# Patient Record
Sex: Male | Born: 1960 | Race: White | Hispanic: No | Smoking: Current every day smoker
Health system: Southern US, Community
[De-identification: ages and names within clinical notes are randomized; demographics above are authoritative.]

## PROBLEM LIST (undated history)

## (undated) DIAGNOSIS — E7800 Pure hypercholesterolemia, unspecified: Secondary | ICD-10-CM

## (undated) DIAGNOSIS — I639 Cerebral infarction, unspecified: Secondary | ICD-10-CM

## (undated) HISTORY — DX: Cerebral infarction, unspecified: I63.9

## (undated) HISTORY — DX: Pure hypercholesterolemia, unspecified: E78.00

## (undated) HISTORY — PX: INGUINAL HERNIA REPAIR: SUR1180

## (undated) SURGERY — Surgical Case
Anesthesia: *Unknown

---

## 2012-01-31 ENCOUNTER — Ambulatory Visit
Admission: RE | Admit: 2012-01-31 | Discharge: 2012-01-31 | Disposition: A | Discharge status: Home / Self Care | Payer: 59 | Source: Ambulatory Visit | Attending: Family Medicine | Admitting: Family Medicine

## 2012-01-31 ENCOUNTER — Other Ambulatory Visit: Payer: Self-pay | Admitting: Family Medicine

## 2012-01-31 DIAGNOSIS — R079 Chest pain, unspecified: Secondary | ICD-10-CM

## 2012-02-04 ENCOUNTER — Other Ambulatory Visit: Payer: Self-pay | Admitting: Chiropractic Medicine

## 2012-02-04 ENCOUNTER — Ambulatory Visit
Admission: RE | Admit: 2012-02-04 | Discharge: 2012-02-04 | Disposition: A | Discharge status: Home / Self Care | Payer: 59 | Source: Ambulatory Visit | Attending: Chiropractic Medicine | Admitting: Chiropractic Medicine

## 2012-02-04 DIAGNOSIS — R222 Localized swelling, mass and lump, trunk: Secondary | ICD-10-CM

## 2012-02-04 DIAGNOSIS — R0789 Other chest pain: Secondary | ICD-10-CM

## 2012-02-04 MED ORDER — IOHEXOL 300 MG/ML  SOLN
75.0000 mL | Freq: Once | INTRAMUSCULAR | Status: AC | PRN
  Administered 2012-02-04: 75 mL via INTRAVENOUS

## 2012-02-06 ENCOUNTER — Ambulatory Visit: Payer: 59

## 2012-02-06 ENCOUNTER — Other Ambulatory Visit (HOSPITAL_BASED_OUTPATIENT_CLINIC_OR_DEPARTMENT_OTHER): Payer: Medicare HMO | Admitting: Lab

## 2012-02-06 ENCOUNTER — Telehealth: Payer: Self-pay | Admitting: Internal Medicine

## 2012-02-06 ENCOUNTER — Ambulatory Visit (HOSPITAL_BASED_OUTPATIENT_CLINIC_OR_DEPARTMENT_OTHER): Payer: Medicare HMO | Admitting: Internal Medicine

## 2012-02-06 ENCOUNTER — Encounter: Payer: Self-pay | Admitting: *Deleted

## 2012-02-06 ENCOUNTER — Encounter: Payer: Self-pay | Admitting: Internal Medicine

## 2012-02-06 ENCOUNTER — Telehealth: Payer: Self-pay | Admitting: Oncology

## 2012-02-06 VITALS — BP 124/74 | HR 105 | Temp 98.3°F | Resp 20 | Ht 70.5 in | Wt 181.9 lb

## 2012-02-06 DIAGNOSIS — R222 Localized swelling, mass and lump, trunk: Secondary | ICD-10-CM

## 2012-02-06 DIAGNOSIS — R911 Solitary pulmonary nodule: Secondary | ICD-10-CM

## 2012-02-06 DIAGNOSIS — F17200 Nicotine dependence, unspecified, uncomplicated: Secondary | ICD-10-CM

## 2012-02-06 DIAGNOSIS — E7800 Pure hypercholesterolemia, unspecified: Secondary | ICD-10-CM | POA: Insufficient documentation

## 2012-02-06 DIAGNOSIS — I10 Essential (primary) hypertension: Secondary | ICD-10-CM

## 2012-02-06 DIAGNOSIS — R918 Other nonspecific abnormal finding of lung field: Secondary | ICD-10-CM | POA: Insufficient documentation

## 2012-02-06 DIAGNOSIS — I639 Cerebral infarction, unspecified: Secondary | ICD-10-CM

## 2012-02-06 DIAGNOSIS — R071 Chest pain on breathing: Secondary | ICD-10-CM

## 2012-02-06 HISTORY — DX: Cerebral infarction, unspecified: I63.9

## 2012-02-06 HISTORY — DX: Pure hypercholesterolemia, unspecified: E78.00

## 2012-02-06 LAB — COMPREHENSIVE METABOLIC PANEL (CC13)
Alkaline Phosphatase: 175 U/L — ABNORMAL HIGH (ref 40–150)
BUN: 19 mg/dL (ref 7.0–26.0)
CO2: 25 mEq/L (ref 22–29)
Creatinine: 1.3 mg/dL (ref 0.7–1.3)
Glucose: 149 mg/dl — ABNORMAL HIGH (ref 70–99)
Sodium: 135 mEq/L — ABNORMAL LOW (ref 136–145)
Total Bilirubin: 0.75 mg/dL (ref 0.20–1.20)
Total Protein: 7.4 g/dL (ref 6.4–8.3)

## 2012-02-06 LAB — CBC WITH DIFFERENTIAL/PLATELET
BASO%: 0.1 % (ref 0.0–2.0)
Eosinophils Absolute: 0.2 10*3/uL (ref 0.0–0.5)
HCT: 39 % (ref 38.4–49.9)
HGB: 13.5 g/dL (ref 13.0–17.1)
LYMPH%: 2.9 % — ABNORMAL LOW (ref 14.0–49.0)
MCHC: 34.5 g/dL (ref 32.0–36.0)
MONO#: 2.2 10*3/uL — ABNORMAL HIGH (ref 0.1–0.9)
NEUT#: 31.8 10*3/uL — ABNORMAL HIGH (ref 1.5–6.5)
NEUT%: 90.1 % — ABNORMAL HIGH (ref 39.0–75.0)
Platelets: 432 10*3/uL — ABNORMAL HIGH (ref 140–400)
WBC: 35.3 10*3/uL — ABNORMAL HIGH (ref 4.0–10.3)
lymph#: 1 10*3/uL (ref 0.9–3.3)

## 2012-02-06 LAB — TECHNOLOGIST REVIEW

## 2012-02-06 MED ORDER — MORPHINE SULFATE ER 30 MG PO TBCR: 30.0000 mg | EXTENDED_RELEASE_TABLET | Freq: Two times a day (BID) | ORAL | Status: DC

## 2012-02-06 NOTE — Telephone Encounter (Signed)
gv adn printed appt schedule for pt for Jan....schdule radiation for 1.22.14 with Dr. Basilio Cairo @ 9:30am...advised pt that central scheduling will contact with d/t for MRI, PET, and CT

## 2012-02-06 NOTE — Progress Notes (Signed)
[facility] [phone]   [phone]  CONSULT NOTE  REFERRING PHYSICIAN: Dr. Dwyane Dee.  REASON FOR CONSULTATION:  52 years old white male with questionable lung cancer.  HPI CHAMPION Brock is a 52 y.o. male was past medical history significant for hypertension, dyslipidemia, history of stroke in September of 2013 secondary to malignant hypertension as well as history of right pneumothorax secondary to fractured ribs. The patient also has a long history of smoking. He most to [place] recently to be close to his mom after he lost his Job in [place] state. He has been complaining of pain on the left shoulder, left upper back as well as the front of the left chest started this in 2 weeks ago. He was seen initially at the urgent care and chest x-ray was performed that was unremarkable. The patient was started on pain medication in the form of Vicodin with no improvement. He was seen by a chiropractor and repeat chest x-ray showed questionable mass in the upper part of the left lung. This was followed by CT scan of the chest which was performed on 02/04/2012 and it showed the left upper lobe soft tissue mass anteriorly abutting the pleura and measured 7.3 x 4.4 x 5.0 CM. There does appear to be surrounding infiltrate. This is worrisome for primary lung carcinoma. A portion of the soft tissue mass extends through the left anterior chest wall and involves the left pectoralis musculature again worrisome for tumor, also no bone destruction is seen but an unusual hematoma could cause the appearance in the event of recent trauma to the site. There was also multiple poorly defined nodular opacities throughout the lungs worrisome for metastatic lesions. There was a small left pleural effusion and a slightly prominent mediastinum and left axilla lymph nodes. The patient was referred to me today for further evaluation and recommendation regarding his condition. He still  complaining of severe pain in the left anterior chest wall as well as the shoulder and neck area. He is currently on Percocet and Vicodin as well as muscle relaxant with no significant improvement. He also complained of chest congestion as well as cough productive of grayish sputum. He has shortness of breath only when he is hurting on the left side of the chest. He lost around 5 pounds over the last 2 weeks. He has no significant headache or blurry vision.  PAST MEDICAL HISTORY: 1) hypertension 2) Dyslipidemia 3) Stroke in 2013 4) Right pneumothorax secondary to trauma  FAMILY HISTORY: is unremarkable for any malignancy.   SOCIAL HISTORY: The patient is married and has 2 children ages 81 and 84. He was accompanied today by his wife Frank Brock and his mother Frank Brock. 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 He has a history of smoking one pack per day for around 35 years and unfortunately he continues to smoke and I strongly advise to quit smoking. He also drinks around 4 alcoholic drinks every day. He has no history of drug abuse.   No Known Allergies  Current Outpatient Prescriptions  Medication Sig Dispense Refill  . aspirin 81 MG tablet Take 81 mg by mouth daily.      Marland Kitchen docusate sodium (COLACE) 100 MG capsule Take 100 mg by mouth 2 (two) times daily.      Marland Kitchen HYDROcodone-acetaminophen (NORCO/VICODIN) 5-325 MG per tablet       . lisinopril (PRINIVIL,ZESTRIL) 20 MG tablet Take 20 mg by mouth daily.      Marland Kitchen  Multiple Vitamin (MULTIVITAMIN) tablet Take 1 tablet by mouth daily.      Marland Kitchen oxyCODONE-acetaminophen (PERCOCET/ROXICET) 5-325 MG per tablet       . ranitidine (ZANTAC) 150 MG capsule Take 150 mg by mouth 2 (two) times daily.      . simvastatin (ZOCOR) 40 MG tablet Take 40 mg by mouth every evening.      Marland Kitchen morphine (MS CONTIN) 30 MG 12 hr tablet Take 1 tablet (30 mg total) by mouth 2 (two) times daily.  60 tablet  0    Review of Systems  A comprehensive review of systems  was negative except for: Constitutional: positive for anorexia, fatigue and weight loss Respiratory: positive for cough, dyspnea on exertion, pleurisy/chest pain and wheezing Musculoskeletal: positive for bone pain  Physical Exam  NWG:NFAOZ, healthy, no distress, well nourished and well developed SKIN: skin color, texture, turgor are normal HEAD: Normocephalic, No masses, lesions, tenderness or abnormalities EYES: normal, PERRLA EARS: External ears normal OROPHARYNX:no exudate and no erythema  NECK: supple, no adenopathy LYMPH:  no palpable lymphadenopathy, no hepatosplenomegaly LUNGS: clear to auscultation  HEART: regular rate & rhythm, no murmurs and no gallops ABDOMEN:abdomen soft, non-tender, normal bowel sounds and no masses or organomegaly BACK: Back symmetric, no curvature. EXTREMITIES:no joint deformities, effusion, or inflammation, no edema, no skin discoloration, no clubbing  NEURO: alert & oriented x 3 with fluent speech, no focal motor/sensory deficits, gait normal  PERFORMANCE STATUS: ECOG 1  LABORATORY DATA: Lab Results  Component Value Date   WBC 35.3* 02/06/2012   HGB 13.5 02/06/2012   HCT 39.0 02/06/2012   MCV 87.0 02/06/2012   PLT 432* 02/06/2012      Chemistry      Component Value Date/Time   NA 135* 02/06/2012 0942   K 4.0 02/06/2012 0942   CL 97* 02/06/2012 0942   CO2 25 02/06/2012 0942   BUN 19.0 02/06/2012 0942   CREATININE 1.3 02/06/2012 0942      Component Value Date/Time   CALCIUM 10.3 02/06/2012 0942   ALKPHOS 175* 02/06/2012 0942   AST 48* 02/06/2012 0942   ALT 57* 02/06/2012 0942   BILITOT 0.75 02/06/2012 0942       RADIOGRAPHIC STUDIES: Dg Chest 2 View  01/31/2012  *RADIOLOGY REPORT*  Clinical Data: Upper chest pain  CHEST - 2 VIEW  Comparison: None.  Findings: The heart and pulmonary vascularity are within normal limits.  There are findings of prior rib fractures with healing on the right.  The lungs are clear.  No acute abnormality is noted.   IMPRESSION: No acute abnormality noted.   Original Report Authenticated By: Alcide Clever, M.D.    Ct Chest W Contrast  02/04/2012  *RADIOLOGY REPORT*  Clinical Data: Chest pain and swelling over the left upper chest, reportedly for last week  CT CHEST WITH CONTRAST  Technique:  Multidetector CT imaging of the chest was performed following the standard protocol during bolus administration of intravenous contrast.  Contrast: 75mL OMNIPAQUE IOHEXOL 300 MG/ML  SOLN  Comparison: Chest x-ray of 01/31/2011  Findings: On soft tissue window images, there is an oval soft tissue mass within the left upper lobe measuring approximately 7.3 x 4.4 x 5.0 cm comprised of mixed attenuation and a small amount of air.  There is surrounding parenchymal streakiness most consistent with adjacent pneumonia.  This mass appears to extend through the left chest wall although no bony erosion is evident, and  it appears to involve the left pectoralis musculature as well.  There is some cortical irregularity of the left costochondral junction, and conceivably this could all represent hematoma in the event of recent direct trauma to this region.  There do appear to be old rib fractures bilaterally.  However, I would favor a malignancy as a cause of this soft tissue mass extending through the chest wall.  On the lung window images there is a small nodular opacity in the left lung apex of 9 mm in diameter, with an opacity posteriorly in the left lower lobe superior segment measuring 11 mm. A ground- glass opacity also is noted medially in the left lower lobe of 18 mm in diameter  A small ground-glass opacity is noted posteriorly in the right lower lobe inferiorly of 8 mm in diameter. Smaller nodular opacities also were present bilaterally.  The multiplicity of vague nodular opacities is suspicious for metastatic involvement.  A small left pleural effusion is present. Either the soft tissue mass or the peripheral lung lesion could be biopsied. A  rounded defect within the left mainstem bronchus may represent residual mucus or polypoid lesion.  Slightly prominent left axillary lymph nodes are present. There are multiple mediastinal nodes present with the largest of 10 mm in short axis diameter and overlying the left suprahilar region.  In the upper abdomen, a subtle left lobe of liver lesion cannot be excluded.  IMPRESSION:  1.  Left upper lobe soft tissue mass anteriorly abutting the pleura of 7.3 x 4.4 x 5.0 cm.  There does appear to be surrounding infiltrate.  This is worrisome for primary lung carcinoma. 2.  A portion of this soft tissue mass extends through the left anterior chest wall and  involves the left pectoralis musculature again worrisome for tumor, although no bone destruction is seen. An unusual hematoma could cause this appearance in the event of recent direct trauma to this site.  An infectious process seems less likely. 3.  Multiple poorly defined nodular opacities throughout the lungs worrisome for metastatic lesions. 4.  Small left pleural effusion. 5.  Slightly prominent mediastinal and left axillary lymph nodes. 6.  Cannot exclude nondisplaced fracture of the left anterior costochondral junction. 7.  Polypoid lesion or mucous within the left mainstem bronchus.   Original Report Authenticated By: Dwyane Dee, M.D.     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 These finding suspicious for primary bronchogenic carcinoma in a patient with known history of smoking. The patient had severe pain in the left anterior chest wall as well as shoulder and upper back  PLAN: I have a lengthy discussion with the patient and his family today about his current disease status and further investigation to confirm diagnosis as well as treatment options. I recommended for the patient the  following: 1) complete the staging workup by ordering a PET scan as well as MRI of the brain to be done as soon as possible. 2) refer the patient to interventional radiology for consideration of CT-guided fine needle aspiration and core biopsy of the left upper lobe lung mass for tissue diagnosis. 3) refer the patient to radiation oncology for consideration of palliative radiotherapy to the left apical lung mass for pain management. 4) I will adjust his pain medication by ordering MS Contin 30 mg by mouth every 12 hours. In addition the patient will continue on Percocet or Vicodin for breakthrough pain. 5)  I strongly encouraged the patient to quit smoking and alcohol drinking. He was seen by the thoracic oncology nurse navigator today for counseling regarding smoke cessation. 6) the patient would come back for followup visit in 2 weeks for evaluation and discussion of his treatment options based on the imaging studies as well as the final pathology report. He was advised to call immediately if he has any concerning symptoms in the interval. I gave the patient and his family the time to ask questions and I answered them completely to their satisfaction.  All questions were answered. The patient knows to call the clinic with any problems, questions or concerns. We can certainly see the patient much sooner if necessary.  Thank you so much for allowing me to participate in the care of Frank Brock. I will continue to follow up the patient with you and assist in his care.  I spent 30 minutes counseling the patient face to face. The total time spent in the appointment was 60 minutes.  Marylan Glore K. 02/06/2012, 10:59 AM

## 2012-02-06 NOTE — Progress Notes (Signed)
Spoke with pt and family at [facility] today.  Gave and explained information on smoking cessation.  Resource information also given.

## 2012-02-06 NOTE — Patient Instructions (Signed)
You have highly suspicious metastatic lung cancer. I ordered several studies to confirm the diagnosis and complete the staging workup. I will refer you to radiation oncology for palliative radiotherapy to the left lung mass. Will adjust your pain medication. Followup in 2 weeks

## 2012-02-06 NOTE — Progress Notes (Signed)
Checked in new pt with no financial concerns. °

## 2012-02-06 NOTE — Telephone Encounter (Signed)
Medical Oncology on call  Call from patient's mother that he is in severe pain despite one dose of MS Contin, has no percocet as he "could not fill" the prescription written by Dr [name] today. I explained to mother that it usually takes 2-3 doses of the sustained release morphine to build up enough to be able to tell a difference with that. I called pharmacist at [facility], who tells me that patient's insurance will not cover the percocet prescription until tomorrow, but that he can pay out of pocket for it. I spoke back to mother, who understands and will go to pharmacy now.  Ila Mcgill, MD

## 2012-02-07 ENCOUNTER — Encounter (HOSPITAL_COMMUNITY): Payer: Self-pay | Admitting: Pharmacy Technician

## 2012-02-07 ENCOUNTER — Telehealth: Payer: Self-pay | Admitting: Internal Medicine

## 2012-02-07 NOTE — Telephone Encounter (Signed)
lmonvm for pt re appt for mri to be done 1/20 @ 11:30 am (arrive 11am) @ [facility] [address] location. S/w vicky @ [facility]. To Ms. Frank Brock to auth.

## 2012-02-08 ENCOUNTER — Other Ambulatory Visit: Payer: Self-pay

## 2012-02-08 ENCOUNTER — Inpatient Hospital Stay (HOSPITAL_COMMUNITY)
Admission: EM | Admit: 2012-02-08 | Discharge: 2012-02-25 | DRG: 853 | Disposition: A | Discharge status: Rehabilitation Facility | Payer: Medicare HMO | Attending: Internal Medicine | Admitting: Internal Medicine

## 2012-02-08 ENCOUNTER — Other Ambulatory Visit: Payer: Self-pay | Admitting: Physician Assistant

## 2012-02-08 ENCOUNTER — Encounter (HOSPITAL_COMMUNITY): Payer: Self-pay | Admitting: *Deleted

## 2012-02-08 ENCOUNTER — Telehealth: Payer: Self-pay | Admitting: Internal Medicine

## 2012-02-08 ENCOUNTER — Telehealth: Payer: Self-pay | Admitting: *Deleted

## 2012-02-08 ENCOUNTER — Emergency Department (HOSPITAL_COMMUNITY): Payer: Medicare HMO

## 2012-02-08 DIAGNOSIS — I6350 Cerebral infarction due to unspecified occlusion or stenosis of unspecified cerebral artery: Secondary | ICD-10-CM

## 2012-02-08 DIAGNOSIS — R4182 Altered mental status, unspecified: Secondary | ICD-10-CM

## 2012-02-08 DIAGNOSIS — R739 Hyperglycemia, unspecified: Secondary | ICD-10-CM | POA: Diagnosis present

## 2012-02-08 DIAGNOSIS — Z8673 Personal history of transient ischemic attack (TIA), and cerebral infarction without residual deficits: Secondary | ICD-10-CM

## 2012-02-08 DIAGNOSIS — L02213 Cutaneous abscess of chest wall: Secondary | ICD-10-CM | POA: Diagnosis present

## 2012-02-08 DIAGNOSIS — I639 Cerebral infarction, unspecified: Secondary | ICD-10-CM | POA: Diagnosis present

## 2012-02-08 DIAGNOSIS — G4700 Insomnia, unspecified: Secondary | ICD-10-CM | POA: Diagnosis present

## 2012-02-08 DIAGNOSIS — D649 Anemia, unspecified: Secondary | ICD-10-CM | POA: Diagnosis present

## 2012-02-08 DIAGNOSIS — R918 Other nonspecific abnormal finding of lung field: Secondary | ICD-10-CM | POA: Diagnosis present

## 2012-02-08 DIAGNOSIS — E7800 Pure hypercholesterolemia, unspecified: Secondary | ICD-10-CM | POA: Diagnosis present

## 2012-02-08 DIAGNOSIS — E870 Hyperosmolality and hypernatremia: Secondary | ICD-10-CM | POA: Diagnosis not present

## 2012-02-08 DIAGNOSIS — C3490 Malignant neoplasm of unspecified part of unspecified bronchus or lung: Secondary | ICD-10-CM | POA: Diagnosis present

## 2012-02-08 DIAGNOSIS — I82629 Acute embolism and thrombosis of deep veins of unspecified upper extremity: Secondary | ICD-10-CM | POA: Diagnosis present

## 2012-02-08 DIAGNOSIS — G8220 Paraplegia, unspecified: Secondary | ICD-10-CM | POA: Diagnosis not present

## 2012-02-08 DIAGNOSIS — N179 Acute kidney failure, unspecified: Secondary | ICD-10-CM | POA: Diagnosis present

## 2012-02-08 DIAGNOSIS — R52 Pain, unspecified: Secondary | ICD-10-CM

## 2012-02-08 DIAGNOSIS — A4101 Sepsis due to Methicillin susceptible Staphylococcus aureus: Principal | ICD-10-CM

## 2012-02-08 DIAGNOSIS — I10 Essential (primary) hypertension: Secondary | ICD-10-CM

## 2012-02-08 DIAGNOSIS — R222 Localized swelling, mass and lump, trunk: Secondary | ICD-10-CM

## 2012-02-08 DIAGNOSIS — A419 Sepsis, unspecified organism: Secondary | ICD-10-CM | POA: Insufficient documentation

## 2012-02-08 DIAGNOSIS — Z9889 Other specified postprocedural states: Secondary | ICD-10-CM

## 2012-02-08 DIAGNOSIS — E876 Hypokalemia: Secondary | ICD-10-CM | POA: Diagnosis present

## 2012-02-08 DIAGNOSIS — R0902 Hypoxemia: Secondary | ICD-10-CM

## 2012-02-08 DIAGNOSIS — R5381 Other malaise: Secondary | ICD-10-CM | POA: Diagnosis present

## 2012-02-08 DIAGNOSIS — J9601 Acute respiratory failure with hypoxia: Secondary | ICD-10-CM | POA: Diagnosis present

## 2012-02-08 DIAGNOSIS — J852 Abscess of lung without pneumonia: Secondary | ICD-10-CM | POA: Diagnosis present

## 2012-02-08 DIAGNOSIS — B9562 Methicillin resistant Staphylococcus aureus infection as the cause of diseases classified elsewhere: Secondary | ICD-10-CM | POA: Diagnosis present

## 2012-02-08 DIAGNOSIS — J9600 Acute respiratory failure, unspecified whether with hypoxia or hypercapnia: Secondary | ICD-10-CM

## 2012-02-08 DIAGNOSIS — F17200 Nicotine dependence, unspecified, uncomplicated: Secondary | ICD-10-CM | POA: Diagnosis present

## 2012-02-08 DIAGNOSIS — E871 Hypo-osmolality and hyponatremia: Secondary | ICD-10-CM | POA: Diagnosis present

## 2012-02-08 DIAGNOSIS — R1310 Dysphagia, unspecified: Secondary | ICD-10-CM | POA: Diagnosis present

## 2012-02-08 DIAGNOSIS — J189 Pneumonia, unspecified organism: Secondary | ICD-10-CM | POA: Diagnosis not present

## 2012-02-08 DIAGNOSIS — R7881 Bacteremia: Secondary | ICD-10-CM

## 2012-02-08 DIAGNOSIS — G8250 Quadriplegia, unspecified: Secondary | ICD-10-CM | POA: Diagnosis present

## 2012-02-08 DIAGNOSIS — L02219 Cutaneous abscess of trunk, unspecified: Secondary | ICD-10-CM | POA: Diagnosis present

## 2012-02-08 DIAGNOSIS — M4620 Osteomyelitis of vertebra, site unspecified: Secondary | ICD-10-CM | POA: Diagnosis present

## 2012-02-08 LAB — COMPREHENSIVE METABOLIC PANEL
ALT: 62 U/L — ABNORMAL HIGH (ref 0–53)
AST: 80 U/L — ABNORMAL HIGH (ref 0–37)
Albumin: 2 g/dL — ABNORMAL LOW (ref 3.5–5.2)
CO2: 24 mEq/L (ref 19–32)
Calcium: 9.4 mg/dL (ref 8.4–10.5)
GFR calc non Af Amer: 29 mL/min — ABNORMAL LOW (ref >90)
Sodium: 127 mEq/L — ABNORMAL LOW (ref 135–145)
Total Protein: 7.4 g/dL (ref 6.0–8.3)

## 2012-02-08 LAB — BLOOD GAS, ARTERIAL
Acid-base deficit: 2.1 mmol/L — ABNORMAL HIGH (ref 0.0–2.0)
Drawn by: 340271
FIO2: 0.32 %
O2 Saturation: 89.9 %
Patient temperature: 98.6
TCO2: 20.4 mmol/L (ref 0–100)
pCO2 arterial: 40.4 mmHg (ref 35.0–45.0)

## 2012-02-08 LAB — PROTIME-INR
INR: 1.38 (ref 0.00–1.49)
Prothrombin Time: 16.6 seconds — ABNORMAL HIGH (ref 11.6–15.2)

## 2012-02-08 LAB — URINALYSIS, MICROSCOPIC ONLY
Glucose, UA: NEGATIVE mg/dL
pH: 5.5 (ref 5.0–8.0)

## 2012-02-08 LAB — MRSA PCR SCREENING: MRSA by PCR: POSITIVE — AB

## 2012-02-08 LAB — CBC WITH DIFFERENTIAL/PLATELET
Basophils Absolute: 0 10*3/uL (ref 0.0–0.1)
Eosinophils Relative: 0 % (ref 0–5)
MCH: 30.3 pg (ref 26.0–34.0)
Monocytes Absolute: 1.8 10*3/uL — ABNORMAL HIGH (ref 0.1–1.0)
Neutrophils Relative %: 93 % — ABNORMAL HIGH (ref 43–77)
Platelets: 262 10*3/uL (ref 150–400)
RBC: 4.26 MIL/uL (ref 4.22–5.81)
RDW: 15.4 % (ref 11.5–15.5)
WBC Morphology: INCREASED
WBC: 44.9 10*3/uL — ABNORMAL HIGH (ref 4.0–10.5)

## 2012-02-08 LAB — EXPECTORATED SPUTUM ASSESSMENT W GRAM STAIN, RFLX TO RESP C

## 2012-02-08 LAB — APTT: aPTT: 37 seconds (ref 24–37)

## 2012-02-08 MED ORDER — CHLORHEXIDINE GLUCONATE 0.12 % MT SOLN
15.0000 mL | Freq: Two times a day (BID) | OROMUCOSAL | Status: DC
  Administered 2012-02-08 – 2012-02-25 (×33): 15 mL via OROMUCOSAL
  Filled 2012-02-08 (×18): qty 15
  Filled 2012-02-08: qty 105
  Filled 2012-02-08 (×16): qty 15

## 2012-02-08 MED ORDER — SODIUM CHLORIDE 0.9 % IV SOLN: Freq: Once | INTRAVENOUS | Status: DC

## 2012-02-08 MED ORDER — IPRATROPIUM BROMIDE 0.02 % IN SOLN
0.5000 mg | RESPIRATORY_TRACT | Status: DC
  Administered 2012-02-08 – 2012-02-09 (×5): 0.5 mg via RESPIRATORY_TRACT
  Filled 2012-02-08 (×5): qty 2.5

## 2012-02-08 MED ORDER — FOLIC ACID 5 MG/ML IJ SOLN
1.0000 mg | Freq: Every day | INTRAMUSCULAR | Status: DC
  Administered 2012-02-08 – 2012-02-09 (×2): 1 mg via INTRAVENOUS
  Filled 2012-02-08 (×2): qty 0.2

## 2012-02-08 MED ORDER — MUPIROCIN 2 % EX OINT
1.0000 "application " | TOPICAL_OINTMENT | Freq: Two times a day (BID) | CUTANEOUS | Status: AC
  Administered 2012-02-08 – 2012-02-13 (×10): 1 via NASAL
  Filled 2012-02-08 (×2): qty 22

## 2012-02-08 MED ORDER — VANCOMYCIN HCL IN DEXTROSE 1-5 GM/200ML-% IV SOLN
1000.0000 mg | Freq: Once | INTRAVENOUS | Status: AC
  Administered 2012-02-08: 1000 mg via INTRAVENOUS
  Filled 2012-02-08: qty 200

## 2012-02-08 MED ORDER — SODIUM CHLORIDE 0.9 % IV BOLUS (SEPSIS)
500.0000 mL | Freq: Once | INTRAVENOUS | Status: AC
  Administered 2012-02-08: 500 mL via INTRAVENOUS

## 2012-02-08 MED ORDER — VANCOMYCIN HCL 10 G IV SOLR
1250.0000 mg | INTRAVENOUS | Status: DC
  Administered 2012-02-09: 1250 mg via INTRAVENOUS
  Filled 2012-02-08: qty 1250

## 2012-02-08 MED ORDER — MORPHINE SULFATE 4 MG/ML IJ SOLN
4.0000 mg | INTRAMUSCULAR | Status: DC | PRN
  Administered 2012-02-08 – 2012-02-09 (×7): 4 mg via INTRAVENOUS
  Filled 2012-02-08 (×7): qty 1

## 2012-02-08 MED ORDER — IPRATROPIUM BROMIDE 0.02 % IN SOLN
0.5000 mg | RESPIRATORY_TRACT | Status: DC
  Administered 2012-02-08: 0.5 mg via RESPIRATORY_TRACT
  Filled 2012-02-08 (×2): qty 2.5

## 2012-02-08 MED ORDER — ALBUTEROL SULFATE (5 MG/ML) 0.5% IN NEBU
5.0000 mg | INHALATION_SOLUTION | Freq: Once | RESPIRATORY_TRACT | Status: AC
  Administered 2012-02-08: 5 mg via RESPIRATORY_TRACT
  Filled 2012-02-08: qty 1

## 2012-02-08 MED ORDER — ACETAMINOPHEN 160 MG/5ML PO SOLN: 650.0000 mg | Freq: Four times a day (QID) | ORAL | Status: DC | PRN

## 2012-02-08 MED ORDER — BIOTENE DRY MOUTH MT LIQD
15.0000 mL | Freq: Two times a day (BID) | OROMUCOSAL | Status: DC
  Administered 2012-02-09 – 2012-02-10 (×2): 15 mL via OROMUCOSAL

## 2012-02-08 MED ORDER — SODIUM CHLORIDE 0.9 % IV SOLN
INTRAVENOUS | Status: DC
  Administered 2012-02-08: 100 mL via INTRAVENOUS

## 2012-02-08 MED ORDER — OSELTAMIVIR PHOSPHATE 75 MG PO CAPS
75.0000 mg | ORAL_CAPSULE | Freq: Two times a day (BID) | ORAL | Status: DC
  Administered 2012-02-08 – 2012-02-09 (×3): 75 mg via ORAL
  Filled 2012-02-08 (×5): qty 1

## 2012-02-08 MED ORDER — CHLORHEXIDINE GLUCONATE CLOTH 2 % EX PADS
6.0000 | MEDICATED_PAD | Freq: Every day | CUTANEOUS | Status: AC
  Administered 2012-02-09 – 2012-02-12 (×3): 6 via TOPICAL

## 2012-02-08 MED ORDER — HEPARIN SODIUM (PORCINE) 5000 UNIT/ML IJ SOLN
5000.0000 [IU] | Freq: Three times a day (TID) | INTRAMUSCULAR | Status: DC
  Administered 2012-02-08 – 2012-02-09 (×3): 5000 [IU] via SUBCUTANEOUS
  Filled 2012-02-08 (×6): qty 1

## 2012-02-08 MED ORDER — VANCOMYCIN HCL IN DEXTROSE 1-5 GM/200ML-% IV SOLN: 1000.0000 mg | Freq: Two times a day (BID) | INTRAVENOUS | Status: DC

## 2012-02-08 MED ORDER — ALBUTEROL SULFATE (5 MG/ML) 0.5% IN NEBU: 2.5000 mg | INHALATION_SOLUTION | RESPIRATORY_TRACT | Status: DC | PRN

## 2012-02-08 MED ORDER — PIPERACILLIN-TAZOBACTAM 3.375 G IVPB
3.3750 g | Freq: Three times a day (TID) | INTRAVENOUS | Status: DC
  Administered 2012-02-08 – 2012-02-10 (×5): 3.375 g via INTRAVENOUS
  Filled 2012-02-08 (×6): qty 50

## 2012-02-08 MED ORDER — ONDANSETRON HCL 4 MG/2ML IJ SOLN
4.0000 mg | Freq: Once | INTRAMUSCULAR | Status: AC
  Administered 2012-02-08: 4 mg via INTRAVENOUS
  Filled 2012-02-08: qty 2

## 2012-02-08 MED ORDER — ALBUTEROL SULFATE (5 MG/ML) 0.5% IN NEBU
2.5000 mg | INHALATION_SOLUTION | RESPIRATORY_TRACT | Status: DC
  Administered 2012-02-08 – 2012-02-09 (×5): 2.5 mg via RESPIRATORY_TRACT
  Filled 2012-02-08 (×7): qty 0.5

## 2012-02-08 MED ORDER — THIAMINE HCL 100 MG/ML IJ SOLN
100.0000 mg | Freq: Every day | INTRAMUSCULAR | Status: DC
  Administered 2012-02-08 – 2012-02-12 (×5): 100 mg via INTRAVENOUS
  Filled 2012-02-08 (×5): qty 1

## 2012-02-08 MED ORDER — PANTOPRAZOLE SODIUM 40 MG IV SOLR
40.0000 mg | INTRAVENOUS | Status: DC
  Administered 2012-02-08: 40 mg via INTRAVENOUS
  Filled 2012-02-08 (×2): qty 40

## 2012-02-08 MED ORDER — PIPERACILLIN-TAZOBACTAM 3.375 G IVPB
3.3750 g | Freq: Once | INTRAVENOUS | Status: AC
  Administered 2012-02-08: 3.375 g via INTRAVENOUS
  Filled 2012-02-08: qty 50

## 2012-02-08 MED ORDER — M.V.I. ADULT IV INJ: INJECTION | INTRAVENOUS | Status: DC

## 2012-02-08 NOTE — ED Notes (Signed)
Admitting MD at bedside.

## 2012-02-08 NOTE — ED Notes (Signed)
Pt reports has not urinated in one and half days and no BM in 3-4 days. Reports pressure in abdomen.

## 2012-02-08 NOTE — ED Provider Notes (Signed)
History     CSN: [id number]  Arrival date & time 02/08/12  1357   First MD Initiated Contact with Patient 02/08/12 1433      Chief Complaint  Patient presents with  . Urinary Retention  . Constipation    (Consider location/radiation/quality/duration/timing/severity/associated sxs/prior treatment) Patient is a 52 y.o. male presenting with cough. The history is provided by the patient. No language interpreter was used.  Cough This is a new problem. The current episode started more than 1 week ago. The problem occurs constantly. The problem has been gradually worsening. The cough is productive of sputum. There has been no fever. Associated symptoms include shortness of breath and wheezing. He has tried nothing for the symptoms. The treatment provided moderate relief. He is a smoker. His past medical history is significant for pneumonia. Past medical history comments: recent lung cancer diagnosis.  Pt seen by Dr. Shirline Frees 2 days ago for evaluation of a lung chest mass.   Pt thought to have lung ca with metastatic disease.   Pt has not started any treatment.  Pt on morphine and percocet.  Pt reports he has been unable to urinate.  Pt reports he has not had a bowel movement in 3 days.  Pt reports poor appetite and decreased fluids.    Past Medical History  Diagnosis Date  . Hypercholesteremia 02/06/2012  . Stroke 02/06/2012    09/13 secondary to hypertensive crisis.  . Lung cancer     No past surgical history on file.  No family history on file.  History  Substance Use Topics  . Smoking status: Current Every Day Smoker  . Smokeless tobacco: Not on file  . Alcohol Use:       Review of Systems  Respiratory: Positive for cough, shortness of breath and wheezing.   Genitourinary: Positive for difficulty urinating.  All other systems reviewed and are negative.    Allergies  Review of patient's allergies indicates no known allergies.  Home Medications   Current Outpatient Rx    Name  Route  Sig  Dispense  Refill  . ASPIRIN 81 MG PO TABS   Oral   Take 81 mg by mouth daily.         Marland Kitchen LISINOPRIL 20 MG PO TABS   Oral   Take 20 mg by mouth daily before lunch.          . MORPHINE SULFATE ER 30 MG PO TBCR   Oral   Take 1 tablet (30 mg total) by mouth 2 (two) times daily.   60 tablet   0   . ONE-DAILY MULTI VITAMINS PO TABS   Oral   Take 1 tablet by mouth daily.         . OXYCODONE-ACETAMINOPHEN 5-325 MG PO TABS   Oral   Take 1 tablet by mouth 2 (two) times daily.          Marland Kitchen POLYETHYLENE GLYCOL 3350 PO PACK   Oral   Take 17 g by mouth daily.         Marland Kitchen SIMVASTATIN 40 MG PO TABS   Oral   Take 40 mg by mouth every evening.           BP 117/72  Pulse 102  Resp 24  SpO2 99%  Physical Exam  Nursing note reviewed. Constitutional: He is oriented to person, place, and time. He appears well-developed and well-nourished.  HENT:  Head: Normocephalic and atraumatic.  Right Ear: External ear normal.  Mouth/Throat: Oropharynx is  clear and moist.  Eyes: Conjunctivae normal are normal. Pupils are equal, round, and reactive to light.  Neck: Normal range of motion. Neck supple.  Cardiovascular: Normal rate and normal heart sounds.   Pulmonary/Chest: He is in respiratory distress. He has wheezes. He exhibits tenderness.  Abdominal: Soft. There is tenderness.  Musculoskeletal: Normal range of motion.  Neurological: He is alert and oriented to person, place, and time.  Skin: Skin is warm.  Psychiatric: He has a normal mood and affect.    ED Course  Procedures (including critical care time)   Labs Reviewed  CBC WITH DIFFERENTIAL  COMPREHENSIVE METABOLIC PANEL  URINALYSIS, ROUTINE W REFLEX MICROSCOPIC  TROPONIN I   No results found.   1. Acute respiratory failure   2. Altered mental status   3. Hypoxemia   4. Lung mass   5. Stroke     Results for orders placed during the hospital encounter of 02/08/12  CBC WITH DIFFERENTIAL       Component Value Range   WBC 44.9 (*) 4.0 - 10.5 K/uL   RBC 4.26  4.22 - 5.81 MIL/uL   Hemoglobin 12.9 (*) 13.0 - 17.0 g/dL   HCT 16.1 (*) 09.6 - 04.5 %   MCV 88.5  78.0 - 100.0 fL   MCH 30.3  26.0 - 34.0 pg   MCHC 34.2  30.0 - 36.0 g/dL   RDW 40.9  81.1 - 91.4 %   Platelets 262  150 - 400 K/uL   Neutrophils Relative 93 (*) 43 - 77 %   Lymphocytes Relative 3 (*) 12 - 46 %   Monocytes Relative 4  3 - 12 %   Eosinophils Relative 0  0 - 5 %   Basophils Relative 0  0 - 1 %   Neutro Abs 41.8 (*) 1.7 - 7.7 K/uL   Lymphs Abs 1.3  0.7 - 4.0 K/uL   Monocytes Absolute 1.8 (*) 0.1 - 1.0 K/uL   Eosinophils Absolute 0.0  0.0 - 0.7 K/uL   Basophils Absolute 0.0  0.0 - 0.1 K/uL   WBC Morphology INCREASED BANDS (>20% BANDS)    COMPREHENSIVE METABOLIC PANEL      Component Value Range   Sodium 127 (*) 135 - 145 mEq/L   Potassium 4.2  3.5 - 5.1 mEq/L   Chloride 86 (*) 96 - 112 mEq/L   CO2 24  19 - 32 mEq/L   Glucose, Bld 107 (*) 70 - 99 mg/dL   BUN 52 (*) 6 - 23 mg/dL   Creatinine, Ser 7.82 (*) 0.50 - 1.35 mg/dL   Calcium 9.4  8.4 - 95.6 mg/dL   Total Protein 7.4  6.0 - 8.3 g/dL   Albumin 2.0 (*) 3.5 - 5.2 g/dL   AST 80 (*) 0 - 37 U/L   ALT 62 (*) 0 - 53 U/L   Alkaline Phosphatase 229 (*) 39 - 117 U/L   Total Bilirubin 0.6  0.3 - 1.2 mg/dL   GFR calc non Af Amer 29 (*) >90 mL/min   GFR calc Af Amer 33 (*) >90 mL/min  TROPONIN I      Component Value Range   Troponin I <0.30  <0.30 ng/mL  LACTIC ACID, PLASMA      Component Value Range   Lactic Acid, Venous 1.7  0.5 - 2.2 mmol/L  URINALYSIS, MICROSCOPIC ONLY      Component Value Range   Color, Urine AMBER (*) YELLOW   APPearance CLOUDY (*) CLEAR   Specific Gravity,  Urine 1.024  1.005 - 1.030   pH 5.5  5.0 - 8.0   Glucose, UA NEGATIVE  NEGATIVE mg/dL   Hgb urine dipstick SMALL (*) NEGATIVE   Bilirubin Urine SMALL (*) NEGATIVE   Ketones, ur NEGATIVE  NEGATIVE mg/dL   Protein, ur 30 (*) NEGATIVE mg/dL   Urobilinogen, UA 1.0  0.0 - 1.0  mg/dL   Nitrite NEGATIVE  NEGATIVE   Leukocytes, UA TRACE (*) NEGATIVE   WBC, UA 0-2  <3 WBC/hpf   Casts GRANULAR CAST (*) NEGATIVE   Urine-Other MUCOUS PRESENT    APTT      Component Value Range   aPTT 37  24 - 37 seconds  PROTIME-INR      Component Value Range   Prothrombin Time 16.6 (*) 11.6 - 15.2 seconds   INR 1.38  0.00 - 1.49  BLOOD GAS, ARTERIAL      Component Value Range   FIO2 0.32     Delivery systems NASAL CANNULA     pH, Arterial 7.365  7.350 - 7.450   pCO2 arterial 40.4  35.0 - 45.0 mmHg   pO2, Arterial 61.5 (*) 80.0 - 100.0 mmHg   Bicarbonate 22.5  20.0 - 24.0 mEq/L   TCO2 20.4  0 - 100 mmol/L   Acid-base deficit 2.1 (*) 0.0 - 2.0 mmol/L   O2 Saturation 89.9     Patient temperature 98.6     Collection site RIGHT RADIAL     Drawn by [id number]     Sample type ARTERIAL DRAW     Allens test (pass/fail) PASS  PASS  CORTISOL      Component Value Range   Cortisol, Plasma 24.2    CULTURE, EXPECTORATED SPUTUM-ASSESSMENT      Component Value Range   Specimen Description SPUTUM     Special Requests NONE     Sputum evaluation       Value: THIS SPECIMEN IS ACCEPTABLE. RESPIRATORY CULTURE REPORT TO FOLLOW.   Report Status 02/08/2012 FINAL    CBC      Component Value Range   WBC 39.8 (*) 4.0 - 10.5 K/uL   RBC 3.95 (*) 4.22 - 5.81 MIL/uL   Hemoglobin 11.9 (*) 13.0 - 17.0 g/dL   HCT 56.2 (*) 13.0 - 86.5 %   MCV 87.1  78.0 - 100.0 fL   MCH 30.1  26.0 - 34.0 pg   MCHC 34.6  30.0 - 36.0 g/dL   RDW 78.4  69.6 - 29.5 %   Platelets 221  150 - 400 K/uL  BASIC METABOLIC PANEL      Component Value Range   Sodium 127 (*) 135 - 145 mEq/L   Potassium 4.3  3.5 - 5.1 mEq/L   Chloride 91 (*) 96 - 112 mEq/L   CO2 24  19 - 32 mEq/L   Glucose, Bld 116 (*) 70 - 99 mg/dL   BUN 42 (*) 6 - 23 mg/dL   Creatinine, Ser 2.84 (*) 0.50 - 1.35 mg/dL   Calcium 8.7  8.4 - 13.2 mg/dL   GFR calc non Af Amer 55 (*) >90 mL/min   GFR calc Af Amer 64 (*) >90 mL/min  MAGNESIUM      Component  Value Range   Magnesium 2.5  1.5 - 2.5 mg/dL  PHOSPHORUS      Component Value Range   Phosphorus 4.1  2.3 - 4.6 mg/dL  MRSA PCR SCREENING      Component Value Range   MRSA by PCR POSITIVE (*)  NEGATIVE   Dg Chest 2 View  01/31/2012  *RADIOLOGY REPORT*  Clinical Data: Upper chest pain  CHEST - 2 VIEW  Comparison: None.  Findings: The heart and pulmonary vascularity are within normal limits.  There are findings of prior rib fractures with healing on the right.  The lungs are clear.  No acute abnormality is noted.  IMPRESSION: No acute abnormality noted.   Original Report Authenticated By: Alcide Clever, M.D.    Ct Chest W Contrast  02/04/2012  *RADIOLOGY REPORT*  Clinical Data: Chest pain and swelling over the left upper chest, reportedly for last week  CT CHEST WITH CONTRAST  Technique:  Multidetector CT imaging of the chest was performed following the standard protocol during bolus administration of intravenous contrast.  Contrast: 75mL OMNIPAQUE IOHEXOL 300 MG/ML  SOLN  Comparison: Chest x-ray of 01/31/2011  Findings: On soft tissue window images, there is an oval soft tissue mass within the left upper lobe measuring approximately 7.3 x 4.4 x 5.0 cm comprised of mixed attenuation and a small amount of air.  There is surrounding parenchymal streakiness most consistent with adjacent pneumonia.  This mass appears to extend through the left chest wall although no bony erosion is evident, and  it appears to involve the left pectoralis musculature as well.  There is some cortical irregularity of the left costochondral junction, and conceivably this could all represent hematoma in the event of recent direct trauma to this region.  There do appear to be old rib fractures bilaterally.  However, I would favor a malignancy as a cause of this soft tissue mass extending through the chest wall.  On the lung window images there is a small nodular opacity in the left lung apex of 9 mm in diameter, with an opacity  posteriorly in the left lower lobe superior segment measuring 11 mm. A ground- glass opacity also is noted medially in the left lower lobe of 18 mm in diameter  A small ground-glass opacity is noted posteriorly in the right lower lobe inferiorly of 8 mm in diameter. Smaller nodular opacities also were present bilaterally.  The multiplicity of vague nodular opacities is suspicious for metastatic involvement.  A small left pleural effusion is present. Either the soft tissue mass or the peripheral lung lesion could be biopsied. A rounded defect within the left mainstem bronchus may represent residual mucus or polypoid lesion.  Slightly prominent left axillary lymph nodes are present. There are multiple mediastinal nodes present with the largest of 10 mm in short axis diameter and overlying the left suprahilar region.  In the upper abdomen, a subtle left lobe of liver lesion cannot be excluded.  IMPRESSION:  1.  Left upper lobe soft tissue mass anteriorly abutting the pleura of 7.3 x 4.4 x 5.0 cm.  There does appear to be surrounding infiltrate.  This is worrisome for primary lung carcinoma. 2.  A portion of this soft tissue mass extends through the left anterior chest wall and  involves the left pectoralis musculature again worrisome for tumor, although no bone destruction is seen. An unusual hematoma could cause this appearance in the event of recent direct trauma to this site.  An infectious process seems less likely. 3.  Multiple poorly defined nodular opacities throughout the lungs worrisome for metastatic lesions. 4.  Small left pleural effusion. 5.  Slightly prominent mediastinal and left axillary lymph nodes. 6.  Cannot exclude nondisplaced fracture of the left anterior costochondral junction. 7.  Polypoid lesion or mucous within the  left mainstem bronchus.   Original Report Authenticated By: Dwyane Dee, M.D.    Dg Chest Port 1 View  02/09/2012  *RADIOLOGY REPORT*  Clinical Data: Cough, congestion, shortness  of breath.  PORTABLE CHEST - 1 VIEW  Comparison: 02/08/2012  Findings: Dense consolidation in the left upper lung stable since previous study.  Patchy areas of consolidation elsewhere in the left lung and in the right lung.  Areas of consolidation appear to be increasing.  Old right rib fracture and old right clavicular fracture are stable.  Normal heart size and pulmonary vascularity. No pleural effusion or pneumothorax.  IMPRESSION: Dense area of consolidation in the left upper lung with developing patchy areas of infiltration bilaterally.  Changes appear to be progressing since the previous study.   Original Report Authenticated By: Burman Nieves, M.D.    Dg Chest Port 1 View  02/08/2012  **ADDENDUM** CREATED: 02/08/2012 15:33:32  Correction to the dictation of CT of the chest dated 02/04/2012:  Under the comparison, the date of the comparison chest x-ray should read 01/31/2012.  **END ADDENDUM** SIGNED BY: Colon Flattery. Gery Pray, M.D.   02/08/2012  *RADIOLOGY REPORT*  Clinical Data: Cough and shortness of breath  PORTABLE CHEST - 1 VIEW  Comparison: Recent CT chest with contrast 02/04/2012 and chest radiograph 01/31/2012  Findings: There is a large somewhat rounded opacity in the left upper lobe and left lung apex, and patchy opacities scattered in the left perihilar region and in both lung bases. No visible pleural effusion.  There is a remote healed right clavicle fracture and remote healed right-sided rib fractures.  IMPRESSION: Large opacity in the left upper lung with patchy opacities in the inferior lungs bilaterally.  Please note that the lungs appeared entirely clear on chest radiograph of 01/31/2012, consistent with a significant, likely aggressive infectious process rather than malignancy.  Findings discussed with Dr. Denton Lank at 3:25 pm on 02/08/2012.   Original Report Authenticated By: Britta Mccreedy, M.D.      MDM   02 sat 78-80 percent per Rn on room air.  Pt placed on nasal 02 with no improvement.   Pt placed on non rebreather at 10 liters,  Pt's 02 improved to 100%.   I reviewed ct scan and pts recoreds from Dr. Shirline Frees.  Pt has biopsy and pet scan pending.  Labs returned Pt's wbc's 44.9  Sodium 127, Bun 52, creat 2.49.  Chest xray shows large opacity and patchy opacities.    Pt evaluated by Dr. Adriana Simas,  I spoke to Dr. Delford Field who will have pt evaluated by Critical care.     CRITICAL CARE Performed by: [facility]   Total critical care time: 30   Critical care time was exclusive of separately billable procedures and treating other patients.  Critical care was necessary to treat or prevent imminent or life-threatening deterioration.  Critical care was time spent personally by me on the following activities: development of treatment plan with patient and/or surrogate as well as nursing, discussions with consultants, evaluation of patient's response to treatment, examination of patient, obtaining history from patient or surrogate, ordering and performing treatments and interventions, ordering and review of laboratory studies, ordering and review of radiographic studies, pulse oximetry and re-evaluation of patient's condition.  Lonia Skinner [place], [place] 02/09/12 0639  Elson Areas, [place] 02/09/12 [phone]

## 2012-02-08 NOTE — Progress Notes (Signed)
ANTIBIOTIC CONSULT NOTE - INITIAL  Pharmacy Consult for vancomycin/zosyn Indication: pneumonia  No Known Allergies  Patient Measurements:   Adjusted Body Weight:  Vital Signs: Temp: 99.9 F (37.7 C) (01/17 1627) Temp src: Rectal (01/17 1627) BP: 119/72 mmHg (01/17 1510) Pulse Rate: 100  (01/17 1510) Intake/Output from previous day:   Intake/Output from this shift: Total I/O In: -  Out: 1400 [Urine:1400]  Labs:  [facility] 02/08/12 1417 02/06/12 0942  WBC 44.9* 35.3*  HGB 12.9* 13.5  PLT 262 432*  LABCREA -- --  CREATININE 2.49* 1.3   The CrCl is unknown because both a height and weight (above a minimum accepted value) are required for this calculation. No results found for this basename: VANCOTROUGH:2,VANCOPEAK:2,VANCORANDOM:2,GENTTROUGH:2,GENTPEAK:2,GENTRANDOM:2,TOBRATROUGH:2,TOBRAPEAK:2,TOBRARND:2,AMIKACINPEAK:2,AMIKACINTROU:2,AMIKACIN:2, in the last 72 hours   Microbiology: Recent Results (from the past 720 hour(s))  TECHNOLOGIST REVIEW     Status: Normal   Collection Time   02/06/12  9:42 AM      Component Value Range Status Comment   Technologist Review Occ Metas and Myelocytes present   Final     Medical History: Past Medical History  Diagnosis Date  . Hypercholesteremia 02/06/2012  . Stroke 02/06/2012    09/13 secondary to hypertensive crisis.  . Lung cancer    Assessment: 63 YOM presents to ED with SOB and confusion. He has is undergoing testing for possible lung cancer. Zosyn and vancomycin in ED (vancomycin yet to be given). SCr is elevated.    1/17 >>vancomycin >> 1/17 >>zosyn >>  1/17 >>tamiflu >>   1/17 blood:pending 1/17 urine:pending / sputum: ordered  Trough/Dose change info:none to date  Goal of Therapy:  Vancomycin trough level 15-20 mcg/ml  Plan:   Vancomcyin 1250mg  IV 24h, give 1st dose ~12h after 1gm dose given in ED to complete   Follow renal function and change dose/check vancomycin trough as appropriate  Zosyn 3.375gm IV  q8h with 4h infusion  Dannielle Huh 02/08/2012,4:56 PM

## 2012-02-08 NOTE — ED Notes (Signed)
Wife reports dx this week with lung cancer, tumor in chest. Scheduled next week for biopsies. Wife reports cough, with brown sputum.

## 2012-02-08 NOTE — H&P (Signed)
PULMONARY  / CRITICAL CARE MEDICINE  Name: Frank Brock MRN: [id number] DOB: [birth year]    LOS: 0  REFERRING MD :  EDP  CHIEF COMPLAINT:  Respiratory faliure  BRIEF PATIENT DESCRIPTION: 52 year old with long standing tobacco abuse history who presents to the hospital with SOB and confusion.  Over the past two days patient has been complaining of left shoulder pain and had a CT of the chest done that showed a mass with chest wall invasion with an elevated WBC on last ED visit.  The patient was sent home with pain medications and saw H/O.  Recommendation for a PET and MRI were made but patient returned back to the ED on 1/17 with hypoxic respiratory failure.  PCCM was called to evaluate after a lung mass was found and patient was desaturating.  LINES / TUBES: PIV  CULTURES: Blood 1/17>>> Urine 1/17>>> Sputum 1/17>>>  ANTIBIOTICS: Vanc 1/17>>> Zosyn 1/17>>> Tamiflu 1/17>>>  PAST MEDICAL HISTORY :  Past Medical History  Diagnosis Date  . Hypercholesteremia 02/06/2012  . Stroke 02/06/2012    09/13 secondary to hypertensive crisis.  . Lung cancer    Past Surgical History  Procedure Date  . Inguinal hernia repair    Prior to Admission medications   Medication Sig Start Date End Date Taking? Authorizing Provider  aspirin 81 MG tablet Take 81 mg by mouth daily.   Yes Historical Provider, MD  lisinopril (PRINIVIL,ZESTRIL) 20 MG tablet Take 20 mg by mouth daily before lunch.    Yes Historical Provider, MD  morphine (MS CONTIN) 30 MG 12 hr tablet Take 1 tablet (30 mg total) by mouth 2 (two) times daily. 02/06/12  Yes Si Gaul, MD  Multiple Vitamin (MULTIVITAMIN) tablet Take 1 tablet by mouth daily.   Yes Historical Provider, MD  oxyCODONE-acetaminophen (PERCOCET/ROXICET) 5-325 MG per tablet Take 1 tablet by mouth 2 (two) times daily.  01/30/12  Yes Dibas Koirala, MD  polyethylene glycol (MIRALAX / GLYCOLAX) packet Take 17 g by mouth daily.   Yes Historical Provider, MD    simvastatin (ZOCOR) 40 MG tablet Take 40 mg by mouth every evening.   Yes Historical Provider, MD   No Known Allergies  FAMILY HISTORY:  Family History  Problem Relation Age of Onset  . Hypothyroidism Mother    SOCIAL HISTORY:  reports that he has been smoking.  He has never used smokeless tobacco. He reports that he drinks about 1.8 ounces of alcohol per week. He reports that he does not use illicit drugs.  REVIEW OF SYSTEMS:  SOB and left shoulder pain, fever at home and cough productive of tan sputum, otherwise 12 point ROS is negative.  VITAL SIGNS: Temp:  [99.9 F (37.7 C)] 99.9 F (37.7 C) (01/17 1627) Pulse Rate:  [100-116] 100  (01/17 1510) Resp:  [20-24] 24  (01/17 1425) BP: (117-137)/(72-81) 119/72 mmHg (01/17 1510) SpO2:  [76 %-99 %] 99 % (01/17 1510) HEMODYNAMICS:   VENTILATOR SETTINGS:   INTAKE / OUTPUT: Intake/Output      01/16 0701 - 01/17 0700 01/17 0701 - 01/18 0700   Urine  1400   Total Output  1400   Net  -1400          PHYSICAL EXAMINATION: General:  Chronically ill appearing male, minimal respiratory distress. Neuro:  Alert and interactive, moves all ext to command. HEENT:  [place]/AT, PERRL, EOM-I, -LAN and -thyromegally. Cardiovascular:  RRR, Nl S1/S2, -M/R/G. Lungs:  Diffuse end exp wheezes, decreased BS on the LUL  anteriorly, with an anterior chest wall fluctuance that is tender to palpation. Abdomen:  Soft, NT, ND and +BS. Musculoskeletal:  -edema and -tenderness. Skin:  Intact, bulge over the left upper chest wall that is tender to palpation.  LABS: Cbc  Lab 02/08/12 1417 02/06/12 0942  WBC 44.9* --  HGB 12.9* 13.5  HCT 37.7* 39.0  PLT 262 432*   Chemistry  Lab 02/08/12 1417 02/06/12 0942  NA 127* 135*  K 4.2 4.0  CL 86* 97*  CO2 24 25  BUN 52* 19.0  CREATININE 2.49* 1.3  CALCIUM 9.4 10.3  MG -- --  PHOS -- --  GLUCOSE 107* 149*   Liver fxn  Lab 02/08/12 1417 02/06/12 0942  AST 80* 48*  ALT 62* 57*  ALKPHOS 229* 175*   BILITOT 0.6 0.75  PROT 7.4 7.4  ALBUMIN 2.0* 2.0*   coags No results found for this basename: APTT:3,INR:3 in the last 168 hours Sepsis markers No results found for this basename: LATICACIDVEN:3,PROCALCITON:3 in the last 168 hours Cardiac markers  Lab 02/08/12 1417  CKTOTAL --  CKMB --  TROPONINI <0.30   BNP No results found for this basename: PROBNP:3 in the last 168 hours ABG No results found for this basename: PHART:3,PCO2ART:3,PO2ART:3,HCO3:3,TCO2:3 in the last 168 hours  CBG trend No results found for this basename: GLUCAP:5 in the last 168 hours  IMAGING:  ECG:  DIAGNOSES: Active Problems:  * No active hospital problems. *    ASSESSMENT / PLAN:  PULMONARY  ASSESSMENT: Hypoxic respiratory failure due to PNA and lung mass ?cancer. PLAN:   - Supplemental O2. - IS. - Duonebs. - Albuterol PRN. - Cx and Abx per ID section.  CARDIOVASCULAR  ASSESSMENT: Tachycardia and normotensive now. PLAN:  IVF resuscitation. Monitor. No beta blocker.  RENAL  ASSESSMENT:  Pre-renal azotemia.  Hyponatremia likely due to lung cancer. PLAN:   Hydration. Monitor electrolytes. Replace electrolytes as needed. NS for hyponatremia. Fluid restriction after fluid resuscitation.  GASTROINTESTINAL  ASSESSMENT:  No active issues.  History of ETOH abuse however. PLAN:   Monitor LFTs. CIWA. Banana bag.  HEMATOLOGIC  ASSESSMENT:  Lung cancer, no tissue diagnosis found however. PLAN:  Monitor daily CBC. After acute illness is over will need to be seen by H/O for staging and ?rad onc for palliative chemo.  INFECTIOUS  ASSESSMENT:  ?PNA, ?aspiration with narc use vs post obstructive PNA.  Has been in and out of the ED for the past 3 days. PLAN:   Pan culture. Broad spectrum abx. Flu test. Tamiflu.  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   PLAN:   Fluid restriction after resuscitation is  complete.  NEUROLOGIC  ASSESSMENT:  CVA history.  Etoh history. PLAN:   CIWA. Banana bag. Monitor.  I have personally obtained a history, examined the patient, evaluated laboratory and imaging results, formulated the assessment and plan and placed orders.  CRITICAL CARE: The patient is critically ill with multiple organ systems failure and requires high complexity decision making for assessment and support, frequent evaluation and titration of therapies, application of advanced monitoring technologies and extensive interpretation of multiple databases. Critical Care Time devoted to patient care services described in this note is 45 minutes.   Alyson Reedy, M.D. Pulmonary and Critical Care Medicine [facility] Pager: [phone]  02/08/2012, 4:33 PM

## 2012-02-08 NOTE — Telephone Encounter (Signed)
Pt's mother called stating that pt is not voiding and is sleeping a lot.  She wants to know what to do.  He has not voided since yesterday.  Per Dr Donnald Garre, pt needs to be evaluated in the ED if he is not voiding.  Pt's mother verbalized understanding.  SLJ

## 2012-02-08 NOTE — Telephone Encounter (Signed)
Faxed pt last office note to Dr. Glendale Chard office

## 2012-02-09 ENCOUNTER — Inpatient Hospital Stay (HOSPITAL_COMMUNITY): Payer: Medicare HMO

## 2012-02-09 DIAGNOSIS — R52 Pain, unspecified: Secondary | ICD-10-CM | POA: Insufficient documentation

## 2012-02-09 DIAGNOSIS — R222 Localized swelling, mass and lump, trunk: Secondary | ICD-10-CM

## 2012-02-09 DIAGNOSIS — R7881 Bacteremia: Secondary | ICD-10-CM | POA: Insufficient documentation

## 2012-02-09 LAB — BASIC METABOLIC PANEL
BUN: 42 mg/dL — ABNORMAL HIGH (ref 6–23)
Calcium: 8.7 mg/dL (ref 8.4–10.5)
Chloride: 91 mEq/L — ABNORMAL LOW (ref 96–112)
Creatinine, Ser: 1.44 mg/dL — ABNORMAL HIGH (ref 0.50–1.35)
GFR calc Af Amer: 64 mL/min — ABNORMAL LOW (ref >90)

## 2012-02-09 LAB — CBC
HCT: 34.4 % — ABNORMAL LOW (ref 39.0–52.0)
MCH: 30.1 pg (ref 26.0–34.0)
MCV: 87.1 fL (ref 78.0–100.0)
Platelets: 221 10*3/uL (ref 150–400)
RDW: 15.3 % (ref 11.5–15.5)

## 2012-02-09 LAB — MAGNESIUM: Magnesium: 2.5 mg/dL (ref 1.5–2.5)

## 2012-02-09 LAB — INFLUENZA PANEL BY PCR (TYPE A & B)
H1N1 flu by pcr: NOT DETECTED
Influenza B By PCR: NEGATIVE

## 2012-02-09 MED ORDER — NALOXONE HCL 0.4 MG/ML IJ SOLN: 0.4000 mg | INTRAMUSCULAR | Status: DC | PRN

## 2012-02-09 MED ORDER — MORPHINE SULFATE ER 15 MG PO TBCR
15.0000 mg | EXTENDED_RELEASE_TABLET | Freq: Two times a day (BID) | ORAL | Status: DC
  Administered 2012-02-09 (×3): 15 mg via ORAL
  Filled 2012-02-09 (×3): qty 1

## 2012-02-09 MED ORDER — LORAZEPAM 2 MG/ML IJ SOLN
0.5000 mg | INTRAMUSCULAR | Status: DC | PRN
  Administered 2012-02-09 – 2012-02-10 (×3): 1 mg via INTRAVENOUS
  Administered 2012-02-10: 0.5 mg via INTRAVENOUS
  Filled 2012-02-09 (×4): qty 1

## 2012-02-09 MED ORDER — DIPHENHYDRAMINE HCL 50 MG/ML IJ SOLN: 12.5000 mg | Freq: Four times a day (QID) | INTRAMUSCULAR | Status: DC | PRN

## 2012-02-09 MED ORDER — IPRATROPIUM BROMIDE 0.02 % IN SOLN
0.5000 mg | Freq: Four times a day (QID) | RESPIRATORY_TRACT | Status: DC
  Administered 2012-02-09 – 2012-02-23 (×56): 0.5 mg via RESPIRATORY_TRACT
  Filled 2012-02-09 (×57): qty 2.5

## 2012-02-09 MED ORDER — ALBUTEROL SULFATE (5 MG/ML) 0.5% IN NEBU
2.5000 mg | INHALATION_SOLUTION | Freq: Four times a day (QID) | RESPIRATORY_TRACT | Status: DC
  Administered 2012-02-09 – 2012-02-23 (×56): 2.5 mg via RESPIRATORY_TRACT
  Filled 2012-02-09 (×56): qty 0.5

## 2012-02-09 MED ORDER — HYDROMORPHONE 0.3 MG/ML IV SOLN
INTRAVENOUS | Status: DC
  Administered 2012-02-09 (×2): via INTRAVENOUS
  Administered 2012-02-09: 1.5 mg via INTRAVENOUS
  Administered 2012-02-09: 2.4 mg via INTRAVENOUS
  Administered 2012-02-10: 3.6 mg via INTRAVENOUS
  Filled 2012-02-09 (×2): qty 25

## 2012-02-09 MED ORDER — VANCOMYCIN HCL IN DEXTROSE 1-5 GM/200ML-% IV SOLN
1000.0000 mg | Freq: Two times a day (BID) | INTRAVENOUS | Status: DC
  Administered 2012-02-09 – 2012-02-10 (×2): 1000 mg via INTRAVENOUS
  Filled 2012-02-09 (×2): qty 200

## 2012-02-09 MED ORDER — SODIUM CHLORIDE 0.9 % IJ SOLN: 9.0000 mL | INTRAMUSCULAR | Status: DC | PRN

## 2012-02-09 MED ORDER — ENOXAPARIN SODIUM 40 MG/0.4ML ~~LOC~~ SOLN
40.0000 mg | SUBCUTANEOUS | Status: DC
  Administered 2012-02-09: 40 mg via SUBCUTANEOUS
  Filled 2012-02-09 (×2): qty 0.4

## 2012-02-09 MED ORDER — DIPHENHYDRAMINE HCL 12.5 MG/5ML PO ELIX: 12.5000 mg | ORAL_SOLUTION | Freq: Four times a day (QID) | ORAL | Status: DC | PRN

## 2012-02-09 MED ORDER — ONDANSETRON HCL 4 MG/2ML IJ SOLN: 4.0000 mg | Freq: Four times a day (QID) | INTRAMUSCULAR | Status: DC | PRN

## 2012-02-09 MED ORDER — ACETAMINOPHEN 160 MG/5ML PO SOLN
650.0000 mg | Freq: Four times a day (QID) | ORAL | Status: DC | PRN
  Administered 2012-02-12 – 2012-02-14 (×4): 650 mg via ORAL
  Filled 2012-02-09 (×4): qty 20.3

## 2012-02-09 MED ORDER — MORPHINE SULFATE ER 30 MG PO TBCR
30.0000 mg | EXTENDED_RELEASE_TABLET | Freq: Two times a day (BID) | ORAL | Status: DC
  Administered 2012-02-09 (×2): 30 mg via ORAL
  Filled 2012-02-09 (×2): qty 1

## 2012-02-09 NOTE — ED Provider Notes (Signed)
Medical screening examination/treatment/procedure(s) were conducted as a shared visit with non-physician practitioner(s) and myself.  I personally evaluated the patient during the encounter.  Complex mass in left lung with extension to left chest wall.  Patient desaturates without oxygen. Admit to critical care   CRITICAL CARE Performed by: Donnetta Hutching  ?  Total critical care time: 30  Critical care time was exclusive of separately billable procedures and treating other patients.  Critical care was necessary to treat or prevent imminent or life-threatening deterioration.  Critical care was time spent personally by me on the following activities: development of treatment plan with patient and/or surrogate as well as nursing, discussions with consultants, evaluation of patient's response to treatment, examination of patient, obtaining history from patient or surrogate, ordering and performing treatments and interventions, ordering and review of laboratory studies, ordering and review of radiographic studies, pulse oximetry and re-evaluation of patient's condition.  Donnetta Hutching, MD 02/09/12 [phone]

## 2012-02-09 NOTE — Progress Notes (Signed)
Micro reviewed   BC pos for GPC; currently on vanc and zosyn.   Repeat cultures; no other intervention.

## 2012-02-09 NOTE — Progress Notes (Signed)
ANTIBIOTIC CONSULT NOTE - FOLLOW UP  Pharmacy Consult for Vancomycin, Zosyn Indication: pneumonia  No Known Allergies  Patient Measurements: Height: 5' 10.47" (179 cm) Weight: 183 lb 13.8 oz (83.4 kg) IBW/kg (Calculated) : 74.09   Labs:  Basename 02/09/12 0335 02/08/12 1417  WBC 39.8* 44.9*  HGB 11.9* 12.9*  PLT 221 262  LABCREA -- --  CREATININE 1.44* 2.49*    Assessment:  51 YOM presented 1/17 with SOB and confusion. Pt with recent dx of metastatic lung cancer.  CXR with large opacity in L uppler lung with patchy opacities in the inferior lung bilaterally. MD started Vanc/Zosyn for ?PNA (aspiriation with narc use vs post-obstructive).    Today is Day#2 Vanc/Zosyn, renal function further improves today for CG CrCl 64 and normalized CrCl 62.  Tmax 100.7, WBC 39.8K.  Pending PCT.    Cultures as below   1/17 blood x 2 >>pending 1/17 urine >>:pending 1/17 sputum >> GPC in pairs/clusters on gram stain 1/17 influenza panel >> negative   Goal of Therapy:  Vancomycin trough level 15-20 mcg/ml Appropriate renal dosing of Zosyn  Plan:   Change Vancomycin to 1gm IV q12h  Continue Zosyn as ordered  MD: Influenza panel returns as negative - consider discontinuing Tamiflu.  Pharmacy will f/u  Frank Brock, PharmD, BCPS Pager: [phone] 1:17 PM Pharmacy #: [id number]

## 2012-02-09 NOTE — Progress Notes (Signed)
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 There does appear to be surrounding infiltrate. This is worrisome for primary lung carcinoma. A portion of this soft tissue mass extends through the left anterior chest wall and involves the left pectoralis musculature. Multiple bilateral nodules worrisome for mets   Lines, Tubes, etc: None  Microbiology: MRSA PCR 1/17 >> POSITIVE Resp 1/17 >>  Blood 1/17 >> 2/2 GPC clusters >>   Antibiotics:  Osletamivir 1/17 >>  Vanc 1/17 >>  Zosyn 1/17 >>   Consults:     Best Practice: DVT: LMWH 1/18 SUP: N/I Nutrition: Reg diet Glycemic control: N/I  Sedation/analgesia: MS contin, PCA dilaudid, PRN lorazepam    Subj: C/o severe chest wall pain. No resp distress. NP rattling cough  Obj: Filed Vitals:   02/09/12 1600  BP: 132/71  Pulse: 109  Temp: 98.7 F (37.1 C)  Resp: 24    Gen: chronically ill appearing HEENT: WNL Neck: Fullness in L side of neck and supraclav region. No discrete nodes Chest: palpable mass over L pectoralis. Diffuse rhonchi Cardiac: tachy, reg, no M Abd: soft, NT, NABS Ext: warm, no edema  BMET    Component Value Date/Time   NA 127* 02/09/2012 0335   NA 135* 02/06/2012 0942   K 4.3 02/09/2012 0335   K 4.0 02/06/2012 0942   CL 91* 02/09/2012 0335   CL 97* 02/06/2012 0942   CO2 24 02/09/2012 0335   CO2 25 02/06/2012 0942   GLUCOSE 116* 02/09/2012 0335   GLUCOSE 149* 02/06/2012 0942   BUN 42* 02/09/2012 0335   BUN 19.0 02/06/2012 0942   CREATININE 1.44* 02/09/2012 0335   CREATININE 1.3 02/06/2012 0942   CALCIUM 8.7 02/09/2012 0335   CALCIUM 10.3 02/06/2012 0942   GFRNONAA 55* 02/09/2012 0335   GFRAA 64* 02/09/2012 0335    CBC    Component Value Date/Time   WBC 39.8*  02/09/2012 0335   WBC 35.3* 02/06/2012 0942   RBC 3.95* 02/09/2012 0335   RBC 4.48 02/06/2012 0942   HGB 11.9* 02/09/2012 0335   HGB 13.5 02/06/2012 0942   HCT 34.4* 02/09/2012 0335   HCT 39.0 02/06/2012 0942   PLT 221 02/09/2012 0335   PLT 432* 02/06/2012 0942   MCV 87.1 02/09/2012 0335   MCV 87.0 02/06/2012 0942   MCH 30.1 02/09/2012 0335   MCH 30.0 02/06/2012 0942   MCHC 34.6 02/09/2012 0335   MCHC 34.5 02/06/2012 0942   RDW 15.3 02/09/2012 0335   RDW 14.5 02/06/2012 0942   LYMPHSABS 1.3 02/08/2012 1417   LYMPHSABS 1.0 02/06/2012 0942   MONOABS 1.8* 02/08/2012 1417   MONOABS 2.2* 02/06/2012 0942   EOSABS 0.0 02/08/2012 1417   EOSABS 0.2 02/06/2012 0942   BASOSABS 0.0 02/08/2012 1417   BASOSABS 0.1 02/06/2012 0942    CXR: LUL mass, diffuse interstitial and AS opacities   IMPRESSION: Active Problems:  Acute respiratory failure with hypoxemia  Presumed CAP  Lung mass, appears to be very rapidly progressive by serial CXRs from 1/09   Pain, chest wall, due to tumor invasion  Bacteremia, 2/2 GPC clusters  Presumed resp source  H/O Heavy EtOH  PLAN/RECS:  Cont current abx as above F/U cx data and adjust abx accordingly Resume MS  contin PCA dilaudid ordered Will need to pursue tissue dx soon and get Onc involved  Billy Fischer, MD ; [facility] service Mobile [phone].  After 5:30 PM or weekends, call [phone]

## 2012-02-09 NOTE — Progress Notes (Signed)
52 yo recently found to have a mass in the L anterior chest with chest wall invasion;   Now complaining of L chest pain; on home MS Contin at 30 mg twice daily;    start MS Contin at 15 with intermittent short acting morphine; to increase at 30 as necessary

## 2012-02-10 ENCOUNTER — Encounter (HOSPITAL_COMMUNITY)
Admission: EM | Disposition: A | Discharge status: Rehabilitation Facility | Payer: Self-pay | Source: Home / Self Care | Attending: Pulmonary Disease

## 2012-02-10 ENCOUNTER — Inpatient Hospital Stay (HOSPITAL_COMMUNITY): Payer: Medicare HMO

## 2012-02-10 ENCOUNTER — Inpatient Hospital Stay (HOSPITAL_COMMUNITY): Payer: Medicare HMO | Admitting: Anesthesiology

## 2012-02-10 ENCOUNTER — Encounter (HOSPITAL_COMMUNITY): Payer: Self-pay | Admitting: Anesthesiology

## 2012-02-10 DIAGNOSIS — R222 Localized swelling, mass and lump, trunk: Secondary | ICD-10-CM

## 2012-02-10 DIAGNOSIS — L02213 Cutaneous abscess of chest wall: Secondary | ICD-10-CM | POA: Diagnosis present

## 2012-02-10 DIAGNOSIS — J852 Abscess of lung without pneumonia: Secondary | ICD-10-CM

## 2012-02-10 DIAGNOSIS — J9600 Acute respiratory failure, unspecified whether with hypoxia or hypercapnia: Secondary | ICD-10-CM

## 2012-02-10 DIAGNOSIS — L02219 Cutaneous abscess of trunk, unspecified: Secondary | ICD-10-CM

## 2012-02-10 DIAGNOSIS — L03319 Cellulitis of trunk, unspecified: Secondary | ICD-10-CM

## 2012-02-10 DIAGNOSIS — R7881 Bacteremia: Secondary | ICD-10-CM

## 2012-02-10 HISTORY — PX: THORACOTOMY: SHX5074

## 2012-02-10 HISTORY — PX: IRRIGATION AND DEBRIDEMENT ABSCESS: SHX5252

## 2012-02-10 LAB — POCT I-STAT 3, ART BLOOD GAS (G3+)
Acid-base deficit: 1 mmol/L (ref 0.0–2.0)
Bicarbonate: 27 mEq/L — ABNORMAL HIGH (ref 20.0–24.0)
Patient temperature: 98.1
Patient temperature: 98.7
pH, Arterial: 7.191 — CL (ref 7.350–7.450)
pO2, Arterial: 115 mmHg — ABNORMAL HIGH (ref 80.0–100.0)

## 2012-02-10 LAB — URINE CULTURE
Colony Count: NO GROWTH
Culture: NO GROWTH

## 2012-02-10 LAB — COMPREHENSIVE METABOLIC PANEL
ALT: 53 U/L (ref 0–53)
AST: 103 U/L — ABNORMAL HIGH (ref 0–37)
Albumin: 1.8 g/dL — ABNORMAL LOW (ref 3.5–5.2)
Alkaline Phosphatase: 174 U/L — ABNORMAL HIGH (ref 39–117)
BUN: 26 mg/dL — ABNORMAL HIGH (ref 6–23)
CO2: 25 mEq/L (ref 19–32)
Calcium: 7.6 mg/dL — ABNORMAL LOW (ref 8.4–10.5)
Chloride: 100 mEq/L (ref 96–112)
Creatinine, Ser: 0.74 mg/dL (ref 0.50–1.35)
GFR calc Af Amer: >90 mL/min (ref >90)
GFR calc non Af Amer: >90 mL/min (ref >90)
Glucose, Bld: 135 mg/dL — ABNORMAL HIGH (ref 70–99)
Potassium: 4 mEq/L (ref 3.5–5.1)
Sodium: 132 mEq/L — ABNORMAL LOW (ref 135–145)
Total Bilirubin: 2.1 mg/dL — ABNORMAL HIGH (ref 0.3–1.2)
Total Protein: 5.4 g/dL — ABNORMAL LOW (ref 6.0–8.3)

## 2012-02-10 LAB — URINALYSIS, ROUTINE W REFLEX MICROSCOPIC
Glucose, UA: NEGATIVE mg/dL
Ketones, ur: 15 mg/dL — AB
Nitrite: NEGATIVE
Protein, ur: 30 mg/dL — AB
Specific Gravity, Urine: 1.022 (ref 1.005–1.030)
Urobilinogen, UA: >8 mg/dL — ABNORMAL HIGH (ref 0.0–1.0)
pH: 6 (ref 5.0–8.0)

## 2012-02-10 LAB — CBC
HCT: 33.3 % — ABNORMAL LOW (ref 39.0–52.0)
HCT: 28.6 % — ABNORMAL LOW (ref 39.0–52.0)
Hemoglobin: 11.3 g/dL — ABNORMAL LOW (ref 13.0–17.0)
Hemoglobin: 9.7 g/dL — ABNORMAL LOW (ref 13.0–17.0)
MCH: 29.9 pg (ref 26.0–34.0)
MCHC: 33.9 g/dL (ref 30.0–36.0)
MCV: 88.3 fL (ref 78.0–100.0)
Platelets: 171 10*3/uL (ref 150–400)
RBC: 3.8 MIL/uL — ABNORMAL LOW (ref 4.22–5.81)
RBC: 3.24 MIL/uL — ABNORMAL LOW (ref 4.22–5.81)
RDW: 15.9 % — ABNORMAL HIGH (ref 11.5–15.5)
WBC: 25.8 10*3/uL — ABNORMAL HIGH (ref 4.0–10.5)

## 2012-02-10 LAB — PROTIME-INR
INR: 1.39 (ref 0.00–1.49)
Prothrombin Time: 16.7 seconds — ABNORMAL HIGH (ref 11.6–15.2)

## 2012-02-10 LAB — BASIC METABOLIC PANEL
BUN: 29 mg/dL — ABNORMAL HIGH (ref 6–23)
CO2: 26 mEq/L (ref 19–32)
Chloride: 96 mEq/L (ref 96–112)
Glucose, Bld: 130 mg/dL — ABNORMAL HIGH (ref 70–99)
Potassium: 4.2 mEq/L (ref 3.5–5.1)
Sodium: 131 mEq/L — ABNORMAL LOW (ref 135–145)

## 2012-02-10 LAB — URINE MICROSCOPIC-ADD ON

## 2012-02-10 LAB — APTT: aPTT: 34 seconds (ref 24–37)

## 2012-02-10 LAB — GRAM STAIN

## 2012-02-10 LAB — PRO B NATRIURETIC PEPTIDE: Pro B Natriuretic peptide (BNP): 707.7 pg/mL — ABNORMAL HIGH (ref 0–125)

## 2012-02-10 SURGERY — IRRIGATION AND DEBRIDEMENT ABSCESS
Anesthesia: General | Site: Chest | Wound class: Clean Contaminated

## 2012-02-10 MED ORDER — VECURONIUM BROMIDE 10 MG IV SOLR
INTRAVENOUS | Status: DC | PRN
  Administered 2012-02-10 (×2): 10 mg via INTRAVENOUS
  Administered 2012-02-10: 5 mg via INTRAVENOUS

## 2012-02-10 MED ORDER — OXYCODONE-ACETAMINOPHEN 5-325 MG PO TABS: 1.0000 | ORAL_TABLET | ORAL | Status: DC | PRN

## 2012-02-10 MED ORDER — TRAMADOL HCL 50 MG PO TABS: 50.0000 mg | ORAL_TABLET | Freq: Four times a day (QID) | ORAL | Status: DC | PRN

## 2012-02-10 MED ORDER — SODIUM CHLORIDE 0.9 % IV SOLN
25.0000 ug/h | INTRAVENOUS | Status: DC
  Administered 2012-02-10: 100 ug/h via INTRAVENOUS
  Administered 2012-02-11 – 2012-02-12 (×2): 90 ug/h via INTRAVENOUS
  Administered 2012-02-13: 100 ug/h via INTRAVENOUS
  Administered 2012-02-13: 150 ug/h via INTRAVENOUS
  Administered 2012-02-13: 100 ug/h via INTRAVENOUS
  Administered 2012-02-13: 150 ug/h via INTRAVENOUS
  Administered 2012-02-14: 200 ug/h via INTRAVENOUS
  Administered 2012-02-14: 100 ug/h via INTRAVENOUS
  Administered 2012-02-14 – 2012-02-15 (×3): 300 ug/h via INTRAVENOUS
  Administered 2012-02-15: 350 ug/h via INTRAVENOUS
  Administered 2012-02-15: 300 ug/h via INTRAVENOUS
  Administered 2012-02-16: 250 ug/h via INTRAVENOUS
  Administered 2012-02-16: 200 ug/h via INTRAVENOUS
  Administered 2012-02-16: 350 ug/h via INTRAVENOUS
  Administered 2012-02-17: 125 ug/h via INTRAVENOUS
  Filled 2012-02-10 (×16): qty 50

## 2012-02-10 MED ORDER — HYDROMORPHONE HCL PF 1 MG/ML IJ SOLN: 0.2500 mg | INTRAMUSCULAR | Status: DC | PRN

## 2012-02-10 MED ORDER — HYDRALAZINE HCL 20 MG/ML IJ SOLN
10.0000 mg | INTRAMUSCULAR | Status: DC | PRN
  Administered 2012-02-11 – 2012-02-14 (×4): 20 mg via INTRAVENOUS
  Administered 2012-02-15 – 2012-02-16 (×2): 40 mg via INTRAVENOUS
  Administered 2012-02-19 – 2012-02-21 (×2): 20 mg via INTRAVENOUS
  Filled 2012-02-10 (×3): qty 2
  Filled 2012-02-10: qty 1
  Filled 2012-02-10 (×2): qty 2

## 2012-02-10 MED ORDER — SODIUM CHLORIDE 0.9 % IV SOLN
INTRAVENOUS | Status: DC | PRN
  Administered 2012-02-10: 15:00:00 via INTRAVENOUS

## 2012-02-10 MED ORDER — POTASSIUM CHLORIDE 10 MEQ/50ML IV SOLN
10.0000 meq | Freq: Every day | INTRAVENOUS | Status: DC | PRN
  Administered 2012-02-17 – 2012-02-18 (×3): 10 meq via INTRAVENOUS
  Filled 2012-02-10: qty 100
  Filled 2012-02-10: qty 50
  Filled 2012-02-10: qty 150

## 2012-02-10 MED ORDER — FENTANYL BOLUS VIA INFUSION
25.0000 ug | Freq: Four times a day (QID) | INTRAVENOUS | Status: DC | PRN
  Filled 2012-02-10: qty 100

## 2012-02-10 MED ORDER — PROPOFOL 10 MG/ML IV BOLUS
INTRAVENOUS | Status: DC | PRN
  Administered 2012-02-10: 200 mg via INTRAVENOUS
  Administered 2012-02-10 (×2): 100 mg via INTRAVENOUS

## 2012-02-10 MED ORDER — CEFAZOLIN SODIUM-DEXTROSE 2-3 GM-% IV SOLR
2.0000 g | Freq: Three times a day (TID) | INTRAVENOUS | Status: DC
  Filled 2012-02-10 (×2): qty 50

## 2012-02-10 MED ORDER — SODIUM CHLORIDE 0.9 % IV BOLUS (SEPSIS)
500.0000 mL | Freq: Once | INTRAVENOUS | Status: AC
  Administered 2012-02-10: 500 mL via INTRAVENOUS

## 2012-02-10 MED ORDER — SODIUM CHLORIDE 0.9 % IV SOLN
1.0000 mg/h | INTRAVENOUS | Status: DC
  Administered 2012-02-10: 2 mg/h via INTRAVENOUS
  Administered 2012-02-11: 3 mg/h via INTRAVENOUS
  Administered 2012-02-11: 2 mg/h via INTRAVENOUS
  Administered 2012-02-12: 4 mg/h via INTRAVENOUS
  Filled 2012-02-10 (×3): qty 10

## 2012-02-10 MED ORDER — MIDAZOLAM HCL 5 MG/5ML IJ SOLN
INTRAMUSCULAR | Status: DC | PRN
  Administered 2012-02-10 (×2): 2 mg via INTRAVENOUS

## 2012-02-10 MED ORDER — OXYCODONE HCL 5 MG PO TABS: 5.0000 mg | ORAL_TABLET | ORAL | Status: DC | PRN

## 2012-02-10 MED ORDER — BIOTENE DRY MOUTH MT LIQD
1.0000 "application " | Freq: Four times a day (QID) | OROMUCOSAL | Status: DC
  Administered 2012-02-11 – 2012-02-25 (×41): 15 mL via OROMUCOSAL

## 2012-02-10 MED ORDER — PIPERACILLIN-TAZOBACTAM 3.375 G IVPB
3.3750 g | Freq: Three times a day (TID) | INTRAVENOUS | Status: DC
  Administered 2012-02-10 – 2012-02-11 (×2): 3.375 g via INTRAVENOUS
  Filled 2012-02-10 (×4): qty 50

## 2012-02-10 MED ORDER — PANTOPRAZOLE SODIUM 40 MG IV SOLR
40.0000 mg | INTRAVENOUS | Status: DC
  Administered 2012-02-10 – 2012-02-13 (×4): 40 mg via INTRAVENOUS
  Filled 2012-02-10 (×5): qty 40

## 2012-02-10 MED ORDER — ONDANSETRON HCL 4 MG/2ML IJ SOLN
INTRAMUSCULAR | Status: DC | PRN
  Administered 2012-02-10: 4 mg via INTRAVENOUS

## 2012-02-10 MED ORDER — PROPOFOL 10 MG/ML IV EMUL
5.0000 ug/kg/min | INTRAVENOUS | Status: DC
  Filled 2012-02-10: qty 100

## 2012-02-10 MED ORDER — DEXTROSE 5 % IV SOLN
2.0000 ug/min | INTRAVENOUS | Status: DC
  Administered 2012-02-10: 10 ug/min via INTRAVENOUS
  Filled 2012-02-10: qty 4

## 2012-02-10 MED ORDER — MIDAZOLAM HCL 2 MG/2ML IJ SOLN
INTRAMUSCULAR | Status: AC
  Administered 2012-02-10: 2 mg
  Filled 2012-02-10: qty 2

## 2012-02-10 MED ORDER — BISACODYL 5 MG PO TBEC
10.0000 mg | DELAYED_RELEASE_TABLET | Freq: Every day | ORAL | Status: DC
  Administered 2012-02-13 – 2012-02-25 (×4): 10 mg via ORAL
  Filled 2012-02-10 (×4): qty 2

## 2012-02-10 MED ORDER — LACTATED RINGERS IV SOLN
INTRAVENOUS | Status: DC | PRN
  Administered 2012-02-10 (×2): via INTRAVENOUS

## 2012-02-10 MED ORDER — DEXTROSE 5 % IV SOLN
1.0000 g | Freq: Two times a day (BID) | INTRAVENOUS | Status: DC
  Administered 2012-02-10: 1 g via INTRAVENOUS
  Filled 2012-02-10 (×2): qty 10

## 2012-02-10 MED ORDER — ACETAMINOPHEN 10 MG/ML IV SOLN
1000.0000 mg | Freq: Four times a day (QID) | INTRAVENOUS | Status: AC
  Administered 2012-02-10 – 2012-02-11 (×3): 1000 mg via INTRAVENOUS
  Filled 2012-02-10 (×3): qty 100

## 2012-02-10 MED ORDER — SODIUM CHLORIDE 0.9 % IJ SOLN
10.0000 mL | INTRAMUSCULAR | Status: DC | PRN
  Administered 2012-02-11 – 2012-02-24 (×3): 10 mL
  Filled 2012-02-10 (×2): qty 20

## 2012-02-10 MED ORDER — FENTANYL CITRATE 0.05 MG/ML IJ SOLN
100.0000 ug | INTRAMUSCULAR | Status: DC | PRN
  Administered 2012-02-10 (×2): 100 ug via INTRAVENOUS
  Filled 2012-02-10 (×2): qty 2

## 2012-02-10 MED ORDER — DEXAMETHASONE SODIUM PHOSPHATE 4 MG/ML IJ SOLN
INTRAMUSCULAR | Status: DC | PRN
  Administered 2012-02-10: 8 mg via INTRAVENOUS

## 2012-02-10 MED ORDER — LACTATED RINGERS IV SOLN
INTRAVENOUS | Status: DC
  Administered 2012-02-10: 20 mL/h via INTRAVENOUS
  Administered 2012-02-12 – 2012-02-14 (×2): via INTRAVENOUS

## 2012-02-10 MED ORDER — SODIUM CHLORIDE 0.9 % IJ SOLN
10.0000 mL | Freq: Two times a day (BID) | INTRAMUSCULAR | Status: DC
  Administered 2012-02-11 (×2): 10 mL
  Administered 2012-02-12: 30 mL
  Administered 2012-02-13 – 2012-02-21 (×7): 10 mL
  Administered 2012-02-22: 20 mL
  Administered 2012-02-22 – 2012-02-24 (×3): 10 mL
  Administered 2012-02-24: 20 mL
  Filled 2012-02-10 (×2): qty 10

## 2012-02-10 MED ORDER — ONDANSETRON HCL 4 MG/2ML IJ SOLN: 4.0000 mg | Freq: Once | INTRAMUSCULAR | Status: DC | PRN

## 2012-02-10 MED ORDER — SUCCINYLCHOLINE CHLORIDE 20 MG/ML IJ SOLN
INTRAMUSCULAR | Status: DC | PRN
  Administered 2012-02-10: 120 mg via INTRAVENOUS

## 2012-02-10 MED ORDER — 0.9 % SODIUM CHLORIDE (POUR BTL) OPTIME
TOPICAL | Status: DC | PRN
  Administered 2012-02-10: 1000 mL

## 2012-02-10 MED ORDER — VANCOMYCIN HCL IN DEXTROSE 1-5 GM/200ML-% IV SOLN
1000.0000 mg | Freq: Two times a day (BID) | INTRAVENOUS | Status: DC
  Administered 2012-02-10: 1000 mg via INTRAVENOUS
  Filled 2012-02-10 (×2): qty 200

## 2012-02-10 MED ORDER — ONDANSETRON HCL 4 MG/2ML IJ SOLN: 4.0000 mg | Freq: Four times a day (QID) | INTRAMUSCULAR | Status: DC | PRN

## 2012-02-10 MED ORDER — MIDAZOLAM BOLUS VIA INFUSION
1.0000 mg | INTRAVENOUS | Status: DC | PRN
  Filled 2012-02-10: qty 2

## 2012-02-10 MED ORDER — VANCOMYCIN HCL IN DEXTROSE 1-5 GM/200ML-% IV SOLN
1000.0000 mg | Freq: Three times a day (TID) | INTRAVENOUS | Status: DC
  Administered 2012-02-10: 1000 mg via INTRAVENOUS
  Filled 2012-02-10 (×3): qty 200

## 2012-02-10 MED ORDER — PHENYLEPHRINE HCL 10 MG/ML IJ SOLN
INTRAMUSCULAR | Status: DC | PRN
  Administered 2012-02-10: 80 ug via INTRAVENOUS

## 2012-02-10 MED ORDER — ALBUMIN HUMAN 5 % IV SOLN
INTRAVENOUS | Status: DC | PRN
  Administered 2012-02-10: 16:00:00 via INTRAVENOUS

## 2012-02-10 MED ORDER — LIDOCAINE HCL (CARDIAC) 20 MG/ML IV SOLN
INTRAVENOUS | Status: DC | PRN
  Administered 2012-02-10: 100 mg via INTRAVENOUS

## 2012-02-10 MED ORDER — METOPROLOL TARTRATE 1 MG/ML IV SOLN
2.5000 mg | INTRAVENOUS | Status: DC | PRN
  Administered 2012-02-16 (×4): 5 mg via INTRAVENOUS
  Filled 2012-02-10 (×4): qty 5

## 2012-02-10 MED ORDER — OXYCODONE HCL 5 MG/5ML PO SOLN: 5.0000 mg | Freq: Once | ORAL | Status: DC | PRN

## 2012-02-10 MED ORDER — HYDROMORPHONE HCL PF 1 MG/ML IJ SOLN
0.5000 mg | INTRAMUSCULAR | Status: DC | PRN
  Administered 2012-02-10 (×3): 1 mg via INTRAVENOUS
  Filled 2012-02-10 (×3): qty 1

## 2012-02-10 MED ORDER — OXYCODONE HCL 5 MG PO TABS: 5.0000 mg | ORAL_TABLET | Freq: Once | ORAL | Status: DC | PRN

## 2012-02-10 MED ORDER — SODIUM CHLORIDE 0.9 % IR SOLN
Status: DC | PRN
  Administered 2012-02-10: 3000 mL

## 2012-02-10 MED ORDER — FENTANYL CITRATE 0.05 MG/ML IJ SOLN
INTRAMUSCULAR | Status: DC | PRN
  Administered 2012-02-10 (×2): 150 ug via INTRAVENOUS
  Administered 2012-02-10: 100 ug via INTRAVENOUS
  Administered 2012-02-10: 250 ug via INTRAVENOUS
  Administered 2012-02-10: 100 ug via INTRAVENOUS
  Administered 2012-02-10: 250 ug via INTRAVENOUS

## 2012-02-10 SURGICAL SUPPLY — 61 items
APPLICATOR TIP COSEAL (VASCULAR PRODUCTS) IMPLANT
APPLICATOR TIP EXT COSEAL (VASCULAR PRODUCTS) IMPLANT
CANISTER SUCTION 2500CC (MISCELLANEOUS) ×3 IMPLANT
CATH KIT ON Q 5IN SLV (PAIN MANAGEMENT) IMPLANT
CATH THORACIC 28FR (CATHETERS) ×3 IMPLANT
CATH THORACIC 36FR (CATHETERS) IMPLANT
CATH THORACIC 36FR RT ANG (CATHETERS) IMPLANT
CLEANER TIP ELECTROSURG 2X2 (MISCELLANEOUS) ×3 IMPLANT
CLIP TI MEDIUM 6 (CLIP) IMPLANT
CLOTH BEACON ORANGE TIMEOUT ST (SAFETY) ×3 IMPLANT
CONT SPEC 4OZ CLIKSEAL STRL BL (MISCELLANEOUS) ×6 IMPLANT
DRAPE LAPAROSCOPIC ABDOMINAL (DRAPES) ×3 IMPLANT
DRAPE WARM FLUID 44X44 (DRAPE) ×3 IMPLANT
DRILL BIT 7/64X5 (BIT) ×3 IMPLANT
DRSG VAC ATS SM SENSATRAC (GAUZE/BANDAGES/DRESSINGS) ×3 IMPLANT
DRSG VERSA FOAM LRG 10X15 (GAUZE/BANDAGES/DRESSINGS) ×3 IMPLANT
ELECT REM PT RETURN 9FT ADLT (ELECTROSURGICAL) ×3
ELECTRODE REM PT RTRN 9FT ADLT (ELECTROSURGICAL) ×2 IMPLANT
GLOVE BIO SURGEON STRL SZ 6.5 (GLOVE) ×6 IMPLANT
GOWN STRL NON-REIN LRG LVL3 (GOWN DISPOSABLE) ×12 IMPLANT
HANDPIECE INTERPULSE COAX TIP (DISPOSABLE) ×1
IV NS IRRIG 3000ML ARTHROMATIC (IV SOLUTION) ×3 IMPLANT
KIT BASIN OR (CUSTOM PROCEDURE TRAY) ×3 IMPLANT
KIT ROOM TURNOVER OR (KITS) ×3 IMPLANT
KIT SUCTION CATH 14FR (SUCTIONS) ×6 IMPLANT
NS IRRIG 1000ML POUR BTL (IV SOLUTION) ×6 IMPLANT
PACK CHEST (CUSTOM PROCEDURE TRAY) ×3 IMPLANT
PAD ARMBOARD 7.5X6 YLW CONV (MISCELLANEOUS) ×6 IMPLANT
SCISSORS LAP 5X35 DISP (ENDOMECHANICALS) IMPLANT
SEALANT PROGEL (MISCELLANEOUS) IMPLANT
SEALANT SURG COSEAL 4ML (VASCULAR PRODUCTS) IMPLANT
SEALANT SURG COSEAL 8ML (VASCULAR PRODUCTS) IMPLANT
SET HNDPC FAN SPRY TIP SCT (DISPOSABLE) ×2 IMPLANT
SOLUTION ANTI FOG 6CC (MISCELLANEOUS) ×3 IMPLANT
SPONGE GAUZE 4X4 12PLY (GAUZE/BANDAGES/DRESSINGS) ×3 IMPLANT
SPONGE LAP 18X18 X RAY DECT (DISPOSABLE) ×9 IMPLANT
SUT PROLENE 3 0 SH DA (SUTURE) IMPLANT
SUT PROLENE 4 0 RB 1 (SUTURE)
SUT PROLENE 4-0 RB1 .5 CRCL 36 (SUTURE) IMPLANT
SUT SILK  1 MH (SUTURE) ×2
SUT SILK 1 MH (SUTURE) ×4 IMPLANT
SUT SILK 2 0SH CR/8 30 (SUTURE) ×3 IMPLANT
SUT SILK 3 0SH CR/8 30 (SUTURE) ×3 IMPLANT
SUT STEEL 1 (SUTURE) ×6 IMPLANT
SUT VIC AB 1 CTX 18 (SUTURE) ×3 IMPLANT
SUT VIC AB 1 CTX 36 (SUTURE)
SUT VIC AB 1 CTX36XBRD ANBCTR (SUTURE) IMPLANT
SUT VIC AB 2-0 CTX 36 (SUTURE) ×3 IMPLANT
SUT VIC AB 3-0 X1 27 (SUTURE) IMPLANT
SUT VICRYL 2 TP 1 (SUTURE) IMPLANT
SWAB COLLECTION DEVICE MRSA (MISCELLANEOUS) ×3 IMPLANT
SYSTEM SAHARA CHEST DRAIN ATS (WOUND CARE) ×3 IMPLANT
TAPE CLOTH 4X10 WHT NS (GAUZE/BANDAGES/DRESSINGS) ×3 IMPLANT
TAPE CLOTH SOFT 2X10 (GAUZE/BANDAGES/DRESSINGS) ×3 IMPLANT
TIP APPLICATOR SPRAY EXTEND 16 (VASCULAR PRODUCTS) IMPLANT
TOWEL OR 17X24 6PK STRL BLUE (TOWEL DISPOSABLE) ×3 IMPLANT
TOWEL OR 17X26 10 PK STRL BLUE (TOWEL DISPOSABLE) ×6 IMPLANT
TRAP SPECIMEN MUCOUS 40CC (MISCELLANEOUS) ×3 IMPLANT
TRAY FOLEY CATH 14FRSI W/METER (CATHETERS) IMPLANT
TUBE ANAEROBIC SPECIMEN COL (MISCELLANEOUS) ×3 IMPLANT
WATER STERILE IRR 1000ML POUR (IV SOLUTION) ×6 IMPLANT

## 2012-02-10 NOTE — Progress Notes (Signed)
eLink Physician-Brief Progress Note Patient Name: Frank Brock DOB: 1960-09-15 MRN: [id number]  Date of Service  02/10/2012   HPI/Events of Note   RN says wife was using the dilaudid PCA for patient and patient unable to use. Last 6h, used 4mg  dialudid  eICU Interventions  Change pca to RN directed dilaudid for pain   Intervention Category Intermediate Interventions: Pain - evaluation and management  [name] 02/10/2012, 2:36 AM

## 2012-02-10 NOTE — Progress Notes (Signed)
eLink Physician-Brief Progress Note Patient Name: JOBY RICHART DOB: May 09, 1960 MRN: [id number]  Date of Service  02/10/2012   HPI/Events of Note   Back from OR intubated.  Not to be extubated as returned OR on 1/20.   eICU Interventions   Ventilator, Protonix, Propofol / Fentanyl, PCXR/ABG, Vancomycin / Zosyn ordered.    Intervention Category Major Interventions: OtherLonia Farber 02/10/2012, 6:28 PM

## 2012-02-10 NOTE — Procedures (Signed)
Central Venous Catheter Insertion Procedure Note Frank Brock [id number] [birth year]  Procedure: Insertion of Central Venous Catheter Indications: Assessment of intravascular volume, Drug and/or fluid administration and Frequent blood sampling  Procedure Details Consent: Risks of procedure as well as the alternatives and risks of each were explained to the (patient/caregiver).  Consent for procedure obtained. Time Out: Verified patient identification, verified procedure, site/side was marked, verified correct patient position, special equipment/implants available, medications/allergies/relevent history reviewed, required imaging and test results available.  Performed  Maximum sterile technique was used including antiseptics, cap, gloves, gown, hand hygiene, mask and sheet. Skin prep: Chlorhexidine; local anesthetic administered A antimicrobial bonded/coated triple lumen catheter was placed in the left internal jugular vein under direct visualization with ultrasound using the Seldinger technique.  Evaluation Blood flow good Complications: No apparent complications Patient did tolerate procedure well. Chest X-ray ordered to verify placement.  CXR: normal.  Overton Mam, M.D. Pulmonary and Critical Care Medicine Call E-link with questions [phone] 02/10/2012, 9:41 PM

## 2012-02-10 NOTE — Progress Notes (Addendum)
Pt profile: 56M with recently discovered LUL/chest wall mass, admitted 1/17 with severe chest wall pain and hypoxemia, suspected PNA.  Further history obtained 1/19: Pt developed  Small lesion on middle finger of L hand around 1/03. Developed severe upper L chest pain 1/08 and seen by primary MD 1/09. CXR normal @ that time. 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 Seen by Onc 1/15 and plans for eval of presumed lung cancer. Admitted via ED 1/17 with severe chest pain, hypoxemia, presumed PNA and continued presumption of LUL malignancy. Blood cultures drawn, abx started. Blood cx's turned positive on 1/18.   Studies/Events: 1/13 CT chest: Left upper lobe soft tissue mass anteriorly abutting the pleura  of 7.3 x 4.4 x 5.0 cm. There does appear to be surrounding infiltrate. This is worrisome for primary lung carcinoma. A portion of this soft tissue mass extends through the left anterior chest wall and involves the left pectoralis musculature. Multiple bilateral nodules worrisome for mets 1/19 concern for chest wall abscess rather than malignancy. Transferred to [facility] for TCTS eval 1/19 Irrigation and debridement L chest wall abscess, mini-thoracotomy, placement of VAC and L chest tube (Gerhardt)  Lines, Tubes, etc: ETT 1/19 >>  L IJ CVL 1/19 >>  R radial A-line 1/19 >>   Microbiology: MRSA PCR 1/17 >> POSITIVE Resp 1/17 >> abundant staph aureus Blood 1/17 >> 2/2  MRSA  Antibiotics:  Osletamivir 1/17 >> 1/19 Zosyn 1/17 >>  Vanc 1/17 >>   Consults:  TCTS 1/19   Best Practice: DVT: LMWH 1/18 SUP: N/I Nutrition: Reg diet Glycemic control: N/I  Sedation/analgesia: MS contin, PCA dilaudid, PRN lorazepam    Subj: Severe chest wall pain persists. No resp distress.  Obj: Filed Vitals:   02/10/12 1355  BP:   Pulse: 116  Temp:   Resp: 31    Gen: chronically ill appearing HEENT: WNL Neck: Fullness in  L side of neck and supraclav region. No discrete nodes Chest: increased palpable mass over L pectoralis - now fluctuant. Diffuse scattered rhonchi Cardiac: tachy, reg, no M Abd: soft, NT, NABS Ext: warm, no edema, erythematous L middle finger with mild L forearm erythema  BMET    Component Value Date/Time   NA 131* 02/10/2012 0358   NA 135* 02/06/2012 0942   K 4.2 02/10/2012 0358   K 4.0 02/06/2012 0942   CL 96 02/10/2012 0358   CL 97* 02/06/2012 0942   CO2 26 02/10/2012 0358   CO2 25 02/06/2012 0942   GLUCOSE 130* 02/10/2012 0358   GLUCOSE 149* 02/06/2012 0942   BUN 29* 02/10/2012 0358   BUN 19.0 02/06/2012 0942   CREATININE 0.90 02/10/2012 0358   CREATININE 1.3 02/06/2012 0942   CALCIUM 8.6 02/10/2012 0358   CALCIUM 10.3 02/06/2012 0942   GFRNONAA >90 02/10/2012 0358   GFRAA >90 02/10/2012 0358    CBC    Component Value Date/Time   WBC 33.5* 02/10/2012 0358   WBC 35.3* 02/06/2012 0942   RBC 3.80* 02/10/2012 0358   RBC 4.48 02/06/2012 0942   HGB 11.3* 02/10/2012 0358   HGB 13.5 02/06/2012 0942   HCT 33.3* 02/10/2012 0358   HCT 39.0 02/06/2012 0942   PLT 208 02/10/2012 0358   PLT 432* 02/06/2012 0942   MCV 87.6 02/10/2012 0358   MCV 87.0 02/06/2012 0942   MCH 29.7 02/10/2012 0358   MCH 30.0 02/06/2012 0942   MCHC 33.9 02/10/2012 0358  MCHC 34.5 02/06/2012 0942   RDW 15.3 02/10/2012 0358   RDW 14.5 02/06/2012 0942   LYMPHSABS 1.3 02/08/2012 1417   LYMPHSABS 1.0 02/06/2012 0942   MONOABS 1.8* 02/08/2012 1417   MONOABS 2.2* 02/06/2012 0942   EOSABS 0.0 02/08/2012 1417   EOSABS 0.2 02/06/2012 0942   BASOSABS 0.0 02/08/2012 1417   BASOSABS 0.1 02/06/2012 0942    CXR: NSC LUL opacity, increased B AS dz   IMPRESSION: Active Problems:  Acute respiratory failure with hypoxemia  Presumed CAP  Lung/chest wall mass, very rapidly progressive by serial CXRs from 1/09   In setting of rapid progression, positive BC and new history, more likely to be abscess  Severe chest wall pain  MRSA bacteremia  H/O  Heavy EtOH  PLAN/RECS:  Abx as above F/U cx data and adjust abx accordingly Repeat CT chest to define progression Transferred to [facility] for TCTS eval  Discussed with Dr Tyrone Sage Continue analgesia Cont PRN ativan  Wife and mother updated at length Discussed with interventional radiology and TCTS 40 mins CCM time  Billy Fischer, MD ; [facility] service Mobile [phone].  After 5:30 PM or weekends, call [phone]

## 2012-02-10 NOTE — Brief Op Note (Signed)
02/08/2012 - 02/10/2012  5:53 PM  PATIENT:  Frank Brock  52 y.o. male  PRE-OPERATIVE DIAGNOSIS:  chest wall abscess with sepsis and respiratory distress  POST-OPERATIVE DIAGNOSIS:  same  PROCEDURE:  Procedure(s) (LRB) with comments: IRRIGATION AND DEBRIDEMENT ABSCESS (Left) MINI/LIMITED THORACOTOMY (Left) Placement of VAC and left chest tube  SURGEON:  Surgeon(s) and Role:    * Delight Ovens, MD - Primary  PHYSICIAN ASSISTANT: Gershon Crane   ANESTHESIA:   general  EBL:  Total I/O In: 6787.5 [I.V.:4800; Blood:700; IV Piggyback:1287.5] Out: 2075 [Urine:475; Blood:1600]  BLOOD ADMINISTERED:one unit  PRBC  DRAINS: Nasogastric Tube, Urinary Catheter (Foley) and #28  Chest Tube(s) in the left    LOCAL MEDICATIONS USED:  NONE  SPECIMEN:  No Specimen cultures done  DISPOSITION OF SPECIMEN:  micro  COUNTS:  YES  TOURNIQUET:  * No tourniquets in log *  DICTATION: .Other Dictation: Dictation Number   PLAN OF CARE: Admit to inpatient   PATIENT DISPOSITION:  ICU - intubated and critically ill.   Delay start of Pharmacological VTE agent (>24hrs) due to surgical blood loss or risk of bleeding: yes

## 2012-02-10 NOTE — Progress Notes (Signed)
INITIAL NUTRITION ASSESSMENT  DOCUMENTATION CODES Per approved criteria  -Not Applicable   INTERVENTION: - When diet advanced, will order nutrition supplements as desired by patient.  - RD to follow for nutrition plan of care.   NUTRITION DIAGNOSIS: Inadequate oral intake related to decreased appetite as evidenced by NPO status.   Goal: Patient will meet >/=90% of estimated nutrition needs.   Monitor:  Diet advancement, PO intake, weight, labs  Reason for Assessment: Malnutrition screening tool, score of 3.   52 y.o. male  Admitting Dx: Respiratory failure  ASSESSMENT: Patient admitted with respiratory failure secondary to pneumonia and lung mass. Patient with poor intake prior to admission.   Height: Ht Readings from Last 1 Encounters:  02/08/12 5' 10.47" (1.79 m)    Weight: Wt Readings from Last 1 Encounters:  02/10/12 179 lb 7.3 oz (81.4 kg)    Ideal Body Weight: 75.5 kg  % Ideal Body Weight: 108%  Wt Readings from Last 10 Encounters:  02/10/12 179 lb 7.3 oz (81.4 kg)  02/10/12 179 lb 7.3 oz (81.4 kg)  02/06/12 181 lb 14.4 oz (82.509 kg)    Usual Body Weight: Unable to obtain from patient.  % Usual Body Weight:   BMI:  Body mass index is 25.41 kg/(m^2). Patient is overweight.   Estimated Nutritional Needs: Kcal: 1800-1950 kcal Protein: 80-95 g Fluid: 2.8 L  Skin: Intact  Diet Order: NPO  EDUCATION NEEDS: -No education needs identified at this time   Intake/Output Summary (Last 24 hours) at 02/10/12 1423 Last data filed at 02/10/12 1242  Gross per 24 hour  Intake 2001.5 ml  Output   1850 ml  Net  151.5 ml    Last BM: PTA   Labs:   Lab 02/10/12 0358 02/09/12 0335 02/08/12 1417  NA 131* 127* 127*  K 4.2 4.3 4.2  CL 96 91* 86*  CO2 26 24 24   BUN 29* 42* 52*  CREATININE 0.90 1.44* 2.49*  CALCIUM 8.6 8.7 9.4  MG -- 2.5 --  PHOS -- 4.1 --  GLUCOSE 130* 116* 107*    CBG (last 3)  No results found for this basename: GLUCAP:3 in  the last 72 hours  Scheduled Meds:   . sodium chloride   Intravenous Once  . ipratropium  0.5 mg Nebulization Q6H   And  . albuterol  2.5 mg Nebulization Q6H  . antiseptic oral rinse  15 mL Mouth Rinse q12n4p  . cefTRIAXone (ROCEPHIN)  IV  1 g Intravenous Q12H  . chlorhexidine  15 mL Mouth Rinse BID  . Chlorhexidine Gluconate Cloth  6 each Topical Q0600  . mupirocin ointment  1 application Nasal BID  . thiamine  100 mg Intravenous Daily  . vancomycin  1,000 mg Intravenous Q8H    Continuous Infusions:   Past Medical History  Diagnosis Date  . Hypercholesteremia 02/06/2012  . Stroke 02/06/2012    09/13 secondary to hypertensive crisis.  . Lung cancer     Past Surgical History  Procedure Date  . Inguinal hernia repair     Linnell Fulling, RD, LDN Pager #: [phone] After-Hours Pager #: [phone]

## 2012-02-10 NOTE — Transfer of Care (Signed)
Immediate Anesthesia Transfer of Care Note  Patient: Frank Brock  Procedure(s) Performed: Procedure(s) (LRB) with comments: IRRIGATION AND DEBRIDEMENT ABSCESS (Left) MINI/LIMITED THORACOTOMY (Left)  Patient Location: SICU  Anesthesia Type:General  Level of Consciousness: sedated and Patient remains intubated per anesthesia plan  Airway & Oxygen Therapy: Patient remains intubated per anesthesia plan  Post-op Assessment: Post -op Vital signs reviewed and stable  Post vital signs: Reviewed and stable  Complications: No apparent anesthesia complications

## 2012-02-10 NOTE — Anesthesia Preprocedure Evaluation (Addendum)
Anesthesia Evaluation  Patient identified by MRN, date of birth, ID band Patient awake    Reviewed: Allergy & Precautions, H&P , NPO status , Patient's Chart, lab work & pertinent test results  Airway       Dental  (+) Teeth Intact   Pulmonary pneumonia -, unresolved,  + rhonchi   + decreased breath sounds      Cardiovascular hypertension, Pt. on medications Rhythm:Regular Rate:Tachycardia     Neuro/Psych    GI/Hepatic   Endo/Other    Renal/GU      Musculoskeletal   Abdominal   Peds  Hematology   Anesthesia Other Findings Pt unable to respond to open mouth for exam.  Reproductive/Obstetrics                          Anesthesia Physical Anesthesia Plan  ASA: IV  Anesthesia Plan: General   Post-op Pain Management:    Induction: Intravenous  Airway Management Planned: Oral ETT  Additional Equipment: Arterial line  Intra-op Plan:   Post-operative Plan: Possible Post-op intubation/ventilation  Informed Consent: I have reviewed the patients History and Physical, chart, labs and discussed the procedure including the risks, benefits and alternatives for the proposed anesthesia with the patient or authorized representative who has indicated his/her understanding and acceptance.   Dental advisory given  Plan Discussed with: CRNA, Anesthesiologist and Surgeon  Anesthesia Plan Comments:        Anesthesia Quick Evaluation

## 2012-02-10 NOTE — Anesthesia Postprocedure Evaluation (Signed)
  Anesthesia Post-op Note  Patient: Frank Brock  Procedure(s) Performed: Procedure(s) (LRB) with comments: IRRIGATION AND DEBRIDEMENT ABSCESS (Left) MINI/LIMITED THORACOTOMY (Left)  Patient Location: SICU  Anesthesia Type:General  Level of Consciousness: sedated and Patient remains intubated per anesthesia plan  Airway and Oxygen Therapy: Patient remains intubated per anesthesia plan and Patient placed on Ventilator (see vital sign flow sheet for setting)  Post-op Pain: none  Post-op Assessment: Post-op Vital signs reviewed  Post-op Vital Signs: Reviewed  Complications: No apparent anesthesia complications

## 2012-02-10 NOTE — Progress Notes (Signed)
Staphylococcus aureus bacteremia (SAB) is associated with a high rate of complications and mortality.  Specific aspects of clinical management are critical to optimizing the outcome of patients with SAB.  Therefore, the [facility] Antimicrobial Management Team ([facility]) have initiated an intervention aimed at improving the management of SAB at [facility].  To do so, Infectious Diseases Consultants are providing evidence-based recommendations for the management of all patients with SAB.  The specific recommendations for this patient are marked "X" in this document.  Recommended [x  ]  Completed [12/09/12]  Perform follow-up blood cultures (even if the patient is afebrile) to ensure clearance of bacteremia.  Recommended [ x ]  Completed [date]   Remove vascular catheter, and obtain follow-up cultures after removal of catheter  Unaware of pt having any long term IV access prior to admission  Recommended [x  ]  Completed [date]   Perform echocardiography to evaluate for endocarditis (transthoracic ECHO is 40-50% sensitive, TEE is > 90% sensitive).*  Please keep in mind, that neither test can definitively EXCLUDE endocarditis, and that should clinical suspicion remain high for endocarditis the patient should then still be treated with an "endocarditis" duration of therapy = 6 weeks  Recommended [  ]  Completed [date]   Consult electrophysiologist to evaluate implanted cardiac device (pacemaker, ICD) NA  Recommended Arly.Keller  ]  Completed [12/09/12]   Ensure source control.  Have all abscesses been drained effectively? Have deep seeded infections (septic joints or osteomyelitis) had appropriate surgical debridement?  PATIENT WAS TRANSFERRED TO [place] FOR EMERGENCY CVTS SURGERY OF LUNG/CHEST WALL ABSCESS   Recommended [ X ]  Completed [date]   Investigate for "metastatic" sites of infection.  WILL EVALUATE POST SURGERY  Does the patient have ANY symptom or physical exam finding that would suggest a deeper  infection (back or neck pain that may be suggestive of vertebral osteomyelitis or epidural abscess, muscle pain that could be a symptom of pyomyositis)?  Keep in mind that for deep seeded infections MRI imaging with contrast is preferred rather than other often insensitive tests such as plain x-rays, especially early in a patient's presentation.   Recommended Arly.Keller  ]  Completed [date]  CONTINUE vancomycin, goal trough should be 15 - 20 mg/L) AND CHANGE CEFAZOLIN TO HAVE BETA LACTAM ON BOARD IN CASE THIS IS MRSA  [ X ]  Estimated duration of IV  Antibiotic therapy:   6 weeks (dependent on diagnostic test results).    I WILL FORMALLY SEE THE PT AS ID CONSULTANT IN THE AM.  [x  ]  Consult case management for probable prolonged outpatient intravenous antibiotic therapy.
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
## 2012-02-10 NOTE — Progress Notes (Signed)
Exploration of left chest wall with incision and drainage of abscess.

## 2012-02-10 NOTE — Preoperative (Signed)
Beta Blockers   Reason not to administer Beta Blockers:Not Applicable 

## 2012-02-10 NOTE — Anesthesia Procedure Notes (Signed)
Procedure Name: Intubation Date/Time: 02/10/2012 4:00 PM Performed by: Wray Kearns A Pre-anesthesia Checklist: Patient identified, Timeout performed, Emergency Drugs available, Suction available and Patient being monitored Patient Re-evaluated:Patient Re-evaluated prior to inductionOxygen Delivery Method: Circle system utilized Preoxygenation: Pre-oxygenation with 100% oxygen Intubation Type: IV induction, Rapid sequence and Cricoid Pressure applied Laryngoscope Size: Mac and 3 Grade View: Grade I Tube type: Subglottic suction tube Tube size: 7.5 mm Number of attempts: 1 Airway Equipment and Method: Stylet Placement Confirmation: ETT inserted through vocal cords under direct vision,  positive ETCO2 and breath sounds checked- equal and bilateral Secured at: 23 cm Tube secured with: Tape Dental Injury: Teeth and Oropharynx as per pre-operative assessment  Comments: During Laryngoscopy Upper Partial removed easily and secured with Circulating Nurse.

## 2012-02-10 NOTE — Brief Op Note (Signed)
                   [address]            [name] [address]          [phone]    02/08/2012 - 02/10/2012  5:40 PM  PATIENT:  Frank Brock  52 y.o. male  PRE-OPERATIVE DIAGNOSIS:  chest wall abscess  POST-OPERATIVE DIAGNOSIS:  chest wall abscess  PROCEDURE:  Procedure(s):INCISION AND DRAINAGE OF CHEST WALL ABSCESS, PLACEMENT OF WOUND VAC, INSERTION OF LEFT CHEST TUBE  SURGEON:  Surgeon(s): Delight Ovens, MD  PHYSICIAN ASSISTANT: Jacklin Zwick PA-C  ANESTHESIA:   general  SPECIMEN:  No Specimen  CULTURES : AEROBIC AND ANAEROBIC  DISPOSITION OF SPECIMEN:  Pathology  DRAINS: 1 42F Chest Tube(s) in the LEFT HEMITHORAX   PATIENT CONDITION:  PACU - hemodynamically stable.  PRE-OPERATIVE WEIGHT: 81kg  COMPLICATIONS: NO KNOWN

## 2012-02-10 NOTE — Progress Notes (Addendum)
ANTIBIOTIC CONSULT NOTE - FOLLOW UP  Pharmacy Consult for Vanc, Zosyn Indication: staph aureus bacteremia, multifocal PNA  No Known Allergies  Patient Measurements: Height: 5' 10.47" (179 cm) Weight: 179 lb 7.3 oz (81.4 kg) IBW/kg (Calculated) : 74.09   Vital Signs: Temp: 99.2 F (37.3 C) (01/19 0800) Temp src: Axillary (01/19 0800) BP: 162/110 mmHg (01/19 1000) Pulse Rate: 117  (01/19 1000) Intake/Output from previous day: 01/18 0701 - 01/19 0700 In: 2051.5 [I.V.:1501.5; IV Piggyback:550] Out: 2195 [Urine:2195] Intake/Output from this shift: Total I/O In: 37.5 [IV Piggyback:37.5] Out: 175 [Urine:175]  Labs:  [facility] 02/10/12 0358 02/09/12 0335 02/08/12 1417  WBC 33.5* 39.8* 44.9*  HGB 11.3* 11.9* 12.9*  PLT 208 221 262  LABCREA -- -- --  CREATININE 0.90 1.44* 2.49*   Estimated Creatinine Clearance: 102.9 ml/min (by C-G formula based on Cr of 0.9). No results found for this basename: VANCOTROUGH:2,VANCOPEAK:2,VANCORANDOM:2,GENTTROUGH:2,GENTPEAK:2,GENTRANDOM:2,TOBRATROUGH:2,TOBRAPEAK:2,TOBRARND:2,AMIKACINPEAK:2,AMIKACINTROU:2,AMIKACIN:2, in the last 72 hours    Assessment:  51 YOM presented to ED 1/17 with SOB and confusion.  Patient is currently being work-up likely for dx of metastatic lung cancer. Today is D#3 Vancomycin Zosyn for PNA.  Blood cultures today growing 2/2 staph aureus.  Tmax 100.3, WBC elevated but improving 33.5K, Scr greatly improved today for normalized and CG CrCl of > 100 ml/min.   CXR with progression of multifocal pneumonia.  Cultures thus far are as follow...  1/17 blood x 2 >> staph aureus (2/2) - awaiting sensitivities 1/17 urine >> NGF 1/17 sputum >> GPC in pairs/clusters on GS - pending culture 1/17 influenza panel >> negative 1/18 blood x 2 >> in process  Goal of Therapy:  Vancomycin trough level 15-20 mcg/ml  Plan:   Change Vancomycin to 1gm IV q8h for improved renal function  Notify ID physician (Dr. Daiva Eves) of Staph aureus  bacteremia per hospital policy  Pharmacy will f/u  Geoffry Paradise, PharmD, BCPS Pager: [phone] 11:19 AM Pharmacy #: [id number]

## 2012-02-10 NOTE — Progress Notes (Signed)
ANTIBIOTIC CONSULT NOTE - FOLLOW UP  Pharmacy Consult for Zosyn Indication: staph aureus bacteremia, multifocal PNA  No Known Allergies  Patient Measurements: Height: 5' 10.47" (179 cm) Weight: 179 lb 7.3 oz (81.4 kg) IBW/kg (Calculated) : 74.09   Vital Signs: Temp: 99.1 F (37.3 C) (01/19 1200) Temp src: Axillary (01/19 1200) BP: 160/87 mmHg (01/19 1200) Pulse Rate: 116  (01/19 1355) Intake/Output from previous day: 01/18 0701 - 01/19 0700 In: 2051.5 [I.V.:1501.5; IV Piggyback:550] Out: 2195 [Urine:2195] Intake/Output from this shift: Total I/O In: 6787.5 [I.V.:4800; Blood:700; IV Piggyback:1287.5] Out: 2075 [Urine:475; Blood:1600]  Labs:  [facility] 02/10/12 0358 02/09/12 0335 02/08/12 1417  WBC 33.5* 39.8* 44.9*  HGB 11.3* 11.9* 12.9*  PLT 208 221 262  LABCREA -- -- --  CREATININE 0.90 1.44* 2.49*   Estimated Creatinine Clearance: 102.9 ml/min (by C-G formula based on Cr of 0.9).  Assessment:  51 YOM presented to ED 1/17 with SOB and confusion.  Patient is now s/p OR for chest wall abscess with sepsis, respiratory distress and drainage.  He has been on multiple antibiotics as listed below. Antibiotics Given (last 72 hours)    Date/Time Action Medication Dose Rate   02/08/12 1900  Given   vancomycin (VANCOCIN) IVPB 1000 mg/200 mL premix 1,000 mg 200 mL/hr   02/08/12 2020  Given   oseltamivir (TAMIFLU) capsule 75 mg 75 mg    02/08/12 2159  Given   piperacillin-tazobactam (ZOSYN) IVPB 3.375 g 3.375 g 12.5 mL/hr   02/09/12 0540  Given   piperacillin-tazobactam (ZOSYN) IVPB 3.375 g 3.375 g 12.5 mL/hr   02/09/12 0540  Given   vancomycin (VANCOCIN) 1,250 mg in sodium chloride 0.9 % 250 mL IVPB 1,250 mg 166.7 mL/hr   02/09/12 0943  Given   oseltamivir (TAMIFLU) capsule 75 mg 75 mg    02/09/12 1259  Given   piperacillin-tazobactam (ZOSYN) IVPB 3.375 g 3.375 g 12.5 mL/hr   02/09/12 1729  Given   vancomycin (VANCOCIN) IVPB 1000 mg/200 mL premix 1,000 mg 200 mL/hr   02/09/12 2238  Given   oseltamivir (TAMIFLU) capsule 75 mg 75 mg    02/09/12 2242  Given   piperacillin-tazobactam (ZOSYN) IVPB 3.375 g 3.375 g 12.5 mL/hr   02/10/12 0548  Given   piperacillin-tazobactam (ZOSYN) IVPB 3.375 g 3.375 g 12.5 mL/hr   02/10/12 0548  Given   vancomycin (VANCOCIN) IVPB 1000 mg/200 mL premix 1,000 mg 200 mL/hr   02/10/12 1241  Given   cefTRIAXone (ROCEPHIN) 1 g in dextrose 5 % 50 mL IVPB 1 g 100 mL/hr   02/10/12 1242  Given   vancomycin (VANCOCIN) IVPB 1000 mg/200 mL premix 1,000 mg 200 mL/hr      Culture data results: 1/17 blood x 2 >> staph aureus (2/2) - awaiting sensitivities 1/17 urine >> NGF 1/17 sputum >> GPC in pairs/clusters on GS - pending culture 1/17 influenza panel >> negative 1/18 blood x 2 >> in process  Goal of Therapy:  Vancomycin trough level 15-20 mcg/ml  Plan:   Continue current Vancomycin 1gm IV q8h   Begin IV Zosyn 3.375gm IV every 8 hours.  Pharmacy will f/u renal function   Nadara Mustard, PharmD., MS Clinical Pharmacist Pager:  [phone] Thank you for allowing pharmacy to be part of this patients care team.

## 2012-02-11 ENCOUNTER — Other Ambulatory Visit: Payer: Self-pay | Admitting: *Deleted

## 2012-02-11 ENCOUNTER — Inpatient Hospital Stay (HOSPITAL_COMMUNITY): Payer: Medicare HMO

## 2012-02-11 ENCOUNTER — Encounter: Payer: Self-pay | Admitting: *Deleted

## 2012-02-11 ENCOUNTER — Inpatient Hospital Stay: Admission: RE | Admit: 2012-02-11 | Payer: Medicare HMO | Source: Ambulatory Visit

## 2012-02-11 ENCOUNTER — Other Ambulatory Visit: Payer: Self-pay | Admitting: Radiology

## 2012-02-11 ENCOUNTER — Inpatient Hospital Stay (HOSPITAL_COMMUNITY): Payer: Medicare HMO | Admitting: Anesthesiology

## 2012-02-11 ENCOUNTER — Encounter (HOSPITAL_COMMUNITY): Payer: Self-pay | Admitting: Anesthesiology

## 2012-02-11 ENCOUNTER — Encounter (HOSPITAL_COMMUNITY): Payer: Self-pay | Admitting: *Deleted

## 2012-02-11 ENCOUNTER — Telehealth: Payer: Self-pay | Admitting: Internal Medicine

## 2012-02-11 ENCOUNTER — Encounter (HOSPITAL_COMMUNITY)
Admission: EM | Disposition: A | Discharge status: Rehabilitation Facility | Payer: Self-pay | Source: Home / Self Care | Attending: Pulmonary Disease

## 2012-02-11 DIAGNOSIS — L02219 Cutaneous abscess of trunk, unspecified: Secondary | ICD-10-CM

## 2012-02-11 DIAGNOSIS — R7881 Bacteremia: Secondary | ICD-10-CM | POA: Diagnosis present

## 2012-02-11 DIAGNOSIS — I517 Cardiomegaly: Secondary | ICD-10-CM

## 2012-02-11 DIAGNOSIS — Z9889 Other specified postprocedural states: Secondary | ICD-10-CM

## 2012-02-11 DIAGNOSIS — L03319 Cellulitis of trunk, unspecified: Secondary | ICD-10-CM

## 2012-02-11 DIAGNOSIS — A4902 Methicillin resistant Staphylococcus aureus infection, unspecified site: Secondary | ICD-10-CM

## 2012-02-11 DIAGNOSIS — A4101 Sepsis due to Methicillin susceptible Staphylococcus aureus: Principal | ICD-10-CM

## 2012-02-11 DIAGNOSIS — A419 Sepsis, unspecified organism: Secondary | ICD-10-CM

## 2012-02-11 HISTORY — PX: CHEST EXPLORATION: SHX5104

## 2012-02-11 HISTORY — PX: MINOR APPLICATION OF WOUND VAC: SHX6243

## 2012-02-11 LAB — BASIC METABOLIC PANEL
GFR calc Af Amer: >90 mL/min (ref >90)
GFR calc non Af Amer: >90 mL/min (ref >90)
Potassium: 3.9 mEq/L (ref 3.5–5.1)
Sodium: 132 mEq/L — ABNORMAL LOW (ref 135–145)

## 2012-02-11 LAB — CULTURE, BLOOD (ROUTINE X 2)

## 2012-02-11 LAB — POCT I-STAT 3, ART BLOOD GAS (G3+)
Bicarbonate: 24 mEq/L (ref 20.0–24.0)
TCO2: 25 mmol/L (ref 0–100)
pCO2 arterial: 39.9 mmHg (ref 35.0–45.0)
pCO2 arterial: 38.4 mmHg (ref 35.0–45.0)
pH, Arterial: 7.396 (ref 7.350–7.450)
pH, Arterial: 7.42 (ref 7.350–7.450)
pO2, Arterial: 162 mmHg — ABNORMAL HIGH (ref 80.0–100.0)
pO2, Arterial: 120 mmHg — ABNORMAL HIGH (ref 80.0–100.0)

## 2012-02-11 LAB — CBC
HCT: 26.9 % — ABNORMAL LOW (ref 39.0–52.0)
Hemoglobin: 9.3 g/dL — ABNORMAL LOW (ref 13.0–17.0)
MCV: 86.2 fL (ref 78.0–100.0)
Platelets: 219 10*3/uL (ref 150–400)
RBC: 3.12 MIL/uL — ABNORMAL LOW (ref 4.22–5.81)
WBC: 25.6 10*3/uL — ABNORMAL HIGH (ref 4.0–10.5)

## 2012-02-11 LAB — GLUCOSE, CAPILLARY: Glucose-Capillary: 108 mg/dL — ABNORMAL HIGH (ref 70–99)

## 2012-02-11 SURGERY — EXPLORATION, CHEST
Anesthesia: General | Site: Chest | Laterality: Left | Wound class: Clean Contaminated

## 2012-02-11 MED ORDER — 0.9 % SODIUM CHLORIDE (POUR BTL) OPTIME
TOPICAL | Status: DC | PRN
  Administered 2012-02-11: 1000 mL

## 2012-02-11 MED ORDER — VANCOMYCIN HCL IN DEXTROSE 1-5 GM/200ML-% IV SOLN
1000.0000 mg | Freq: Three times a day (TID) | INTRAVENOUS | Status: DC
  Administered 2012-02-11 – 2012-02-12 (×4): 1000 mg via INTRAVENOUS
  Filled 2012-02-11 (×6): qty 200

## 2012-02-11 MED ORDER — PROPOFOL 10 MG/ML IV EMUL
INTRAVENOUS | Status: AC
  Filled 2012-02-11: qty 100

## 2012-02-11 MED ORDER — PHENYLEPHRINE HCL 10 MG/ML IJ SOLN
INTRAMUSCULAR | Status: DC | PRN
  Administered 2012-02-11: 80 ug via INTRAVENOUS

## 2012-02-11 MED ORDER — VECURONIUM BROMIDE 10 MG IV SOLR
INTRAVENOUS | Status: DC | PRN
  Administered 2012-02-11: 5 mg via INTRAVENOUS
  Administered 2012-02-11: 2 mg via INTRAVENOUS

## 2012-02-11 MED ORDER — LACTATED RINGERS IV SOLN
INTRAVENOUS | Status: DC | PRN
  Administered 2012-02-11: 07:00:00 via INTRAVENOUS

## 2012-02-11 MED ORDER — MIDAZOLAM HCL 5 MG/5ML IJ SOLN
INTRAMUSCULAR | Status: DC | PRN
  Administered 2012-02-11: 2 mg via INTRAVENOUS

## 2012-02-11 MED ORDER — PROPOFOL 10 MG/ML IV EMUL
5.0000 ug/kg/min | INTRAVENOUS | Status: DC
  Administered 2012-02-11: 10 ug/kg/min via INTRAVENOUS
  Administered 2012-02-12: 30 ug/kg/min via INTRAVENOUS
  Administered 2012-02-12 (×3): 20 ug/kg/min via INTRAVENOUS
  Administered 2012-02-13 (×2): 30 ug/kg/min via INTRAVENOUS
  Administered 2012-02-14 (×2): 60 ug/kg/min via INTRAVENOUS
  Administered 2012-02-14: 50 ug/kg/min via INTRAVENOUS
  Administered 2012-02-14 (×2): 55 ug/kg/min via INTRAVENOUS
  Administered 2012-02-14: 70 ug/kg/min via INTRAVENOUS
  Administered 2012-02-15 (×2): 45 ug/kg/min via INTRAVENOUS
  Administered 2012-02-15: 50 ug/kg/min via INTRAVENOUS
  Administered 2012-02-15: 45 ug/kg/min via INTRAVENOUS
  Administered 2012-02-15: 30 ug/kg/min via INTRAVENOUS
  Administered 2012-02-15: 60 ug/kg/min via INTRAVENOUS
  Administered 2012-02-16 (×2): 30 ug/kg/min via INTRAVENOUS
  Administered 2012-02-16: 40 ug/kg/min via INTRAVENOUS
  Administered 2012-02-16: 50 ug/kg/min via INTRAVENOUS
  Administered 2012-02-16: 20 ug/kg/min via INTRAVENOUS
  Filled 2012-02-11 (×28): qty 100

## 2012-02-11 SURGICAL SUPPLY — 17 items
CANISTER WOUND CARE 500ML ATS (WOUND CARE) ×3 IMPLANT
COVER SURGICAL LIGHT HANDLE (MISCELLANEOUS) ×3 IMPLANT
DRAPE CHEST BREAST 15X10 FENES (DRAPES) ×3 IMPLANT
DRSG VAC ATS SM SENSATRAC (GAUZE/BANDAGES/DRESSINGS) ×3 IMPLANT
DRSG VERSA FOAM LRG 10X15 (GAUZE/BANDAGES/DRESSINGS) ×3 IMPLANT
ELECT BLADE 4.0 EZ CLEAN MEGAD (MISCELLANEOUS) ×3
ELECTRODE BLDE 4.0 EZ CLN MEGD (MISCELLANEOUS) ×2 IMPLANT
GOWN STRL NON-REIN LRG LVL3 (GOWN DISPOSABLE) ×6 IMPLANT
IV NS IRRIG 3000ML ARTHROMATIC (IV SOLUTION) ×3 IMPLANT
KIT BASIN OR (CUSTOM PROCEDURE TRAY) ×3 IMPLANT
KIT SUCTION CATH 14FR (SUCTIONS) ×3 IMPLANT
NS IRRIG 1000ML POUR BTL (IV SOLUTION) ×6 IMPLANT
PACK GENERAL/GYN (CUSTOM PROCEDURE TRAY) ×3 IMPLANT
PENCIL BUTTON HOLSTER BLD 10FT (ELECTRODE) ×3 IMPLANT
TOWEL OR 17X26 10 PK STRL BLUE (TOWEL DISPOSABLE) ×6 IMPLANT
TUBE CONNECTING 12X1/4 (SUCTIONS) ×3 IMPLANT
WATER STERILE IRR 1000ML POUR (IV SOLUTION) ×3 IMPLANT

## 2012-02-11 NOTE — Clinical Documentation Improvement (Signed)
EXCISIONAL DEBRIDEMENT DOCUMENTATION CLARIFICATION  THIS DOCUMENT IS NOT A PERMANENT PART OF THE MEDICAL RECORD  TO RESPOND TO THE THIS QUERY, FOLLOW THE INSTRUCTIONS BELOW:  1. If needed, update documentation for the patient's encounter via the notes activity.  2. Access this query again and click edit on the In [employer].  3. After updating, or not, click F2 to complete all highlighted (required) fields concerning your review. Select "additional documentation in the medical record" OR "no additional documentation provided".  4. Click Sign note button.  5. The deficiency will fall out of your In Basket *Please let us know if you are not able to complete this workflow by phone or e-mail (listed below).  Please update your documentation within the medical record to reflect your response to this query.                                                                                        02/11/12   Dear Dr. Delight Ovens,  / Associates,  In a better effort to capture your patient's severity of illness, reflect appropriate length of stay and utilization of resources, a review of the patient medical record has revealed the following indicators.    Based on your clinical judgment, please clarify and document in a progress note and/or discharge summary the clinical condition associated with the following supporting information:  In responding to this query please exercise your independent judgment.  The fact that a query is asked, does not imply that any particular answer is desired or expected. Because that is documentation in the medical record of "debridement" clarification is needed.  In this pt with hypoxic respiratory failure, PNA, and lung mass ? cander a review of the medical record reveals the following:   IRRIGATION AND DEBRIDEMENT ABSCESS (Left) per Op 02/10/12 1757   MINI/LIMITED THORACOTOMY (Left) per Op note: 02/10/12 1757   Clarification Needed The op note states  the following:   The subpectoralis tissue plane was entered with copious amounts of white purulent material returned, this area extended inferiorly and laterally, the entire cavity was debrided well  Please clarify the following:  Please note if debridement was Excisional for Non excisional in progress note  Name the Instrument used to "debride" the cavity  The depth of the tissue debrided  The size of the debrided area  Op note states that Pulsavac was used to irrigate the wound...was this instrument used to "debride" the area as well?  Please response to the questions above in the pn or d/c summary.    Thank you for all that you do for our patients!     Please document the below (4) key elements in a progress note:  1.  Type of Debridement:          Excisional Debridement-  Cutting away necrotic, devitalized tissue or slough to  Level of viable tissue using a sharp instrument (example, scalpel, scissors, etc).           NON Excisional Debridement-  The removal of necrotic, devitalized tissue or slough by means of scraping, mechanical brushing, flushing, or washing. (example: irrigation, whirlpool);  Minor removal  of loose fragments  2.  Please indicate instrument used:  Scissors, Scalpel, Curette, or other  3.  Please document depth of the debridement:             Partial Thickness  Full Thickness  Skin and Subcutaneous Tissue  Subcutaneous Tissue and Muscle  Subcutaneous Tissue, Muscle and Bone  Other  4.  Document the size of the debridement.  You may use possible, probable, or suspect with inpatient documentation. possible, probable, suspected diagnoses MUST be documented at the time of discharge  Reviewed: additional documentation in the medical record  Thank You,  Enis Slipper RN, BSN, MSN/Inf, CCDS Clinical Documentation Specialist [facility] Dept Pager: [phone] / E-mail: [email or web address]  Health Information Management [facility]

## 2012-02-11 NOTE — Transfer of Care (Signed)
Immediate Anesthesia Transfer of Care Note  Patient: Frank Brock  Procedure(s) Performed: Procedure(s) (LRB) with comments: CHEST EXPLORATION (Left) - re-exploration of left chest wall; with wound vac change MINOR APPLICATION OF WOUND VAC (Left) - wound vac change  Patient Location: SICU  Anesthesia Type:General  Level of Consciousness: sedated and Patient remains intubated per anesthesia plan  Airway & Oxygen Therapy: Patient remains intubated per anesthesia plan and Patient placed on Ventilator (see vital sign flow sheet for setting)  Post-op Assessment: Report given to PACU RN and Post -op Vital signs reviewed and stable  Post vital signs: Reviewed and stable  Complications: No apparent anesthesia complications

## 2012-02-11 NOTE — Brief Op Note (Signed)
02/08/2012 - 02/11/2012  8:43 AM  PATIENT:  Frank Brock  52 y.o. male  PRE-OPERATIVE DIAGNOSIS:  left chest wall abscess  POST-OPERATIVE DIAGNOSIS:  left chest wall abscess  PROCEDURE:  Procedure(s) (LRB) with comments: CHEST EXPLORATION (Left) - re-exploration of left chest wall; with wound vac change MINOR APPLICATION OF WOUND VAC (Left) - wound vac change  SURGEON:  Surgeon(s) and Role:    * Delight Ovens, MD - Primary  PHYSICIAN ASSISTANT:   ASSISTANTS: none   ANESTHESIA:   general  EBL:     BLOOD ADMINISTERED:none  DRAINS: none, Nasogastric Tube, 28 Chest Tube(s) in the left and chest   LOCAL MEDICATIONS USED:  NONE  SPECIMEN:  No Specimen  DISPOSITION OF SPECIMEN:  N/A  COUNTS:  YES  TOURNIQUET:  * No tourniquets in log *  DICTATION: .Other Dictation: Dictation Number   PLAN OF CARE: back to icu  PATIENT DISPOSITION:  ICU - intubated and critically ill.   Delay start of Pharmacological VTE agent (>24hrs) due to surgical blood loss or risk of bleeding: yes

## 2012-02-11 NOTE — Telephone Encounter (Signed)
Per 1/20 pof cx the 1.30 appt,pt in hosp

## 2012-02-11 NOTE — Progress Notes (Signed)
PCCM Progress Note  Pt profile: 50M with recently discovered LUL/chest wall mass, admitted 1/17 with severe chest wall pain and hypoxemia, suspected PNA.  Further history obtained 1/19: Pt developed  Small lesion on middle finger of L hand around 1/03. Developed severe upper L chest pain 1/08 and seen by primary MD 1/09. CXR normal @ that time. 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 Seen by Onc 1/15 and plans for eval of presumed lung cancer. Admitted via ED 1/17 with severe chest pain, hypoxemia, presumed PNA and continued presumption of LUL malignancy. Blood cultures drawn, abx started. Blood cx's turned positive on 1/18.   Studies/Events: 1/13 CT chest: Left upper lobe soft tissue mass anteriorly abutting the pleura  of 7.3 x 4.4 x 5.0 cm. There does appear to be surrounding infiltrate. This is worrisome for primary lung carcinoma. A portion of this soft tissue mass extends through the left anterior chest wall and involves the left pectoralis musculature. Multiple bilateral nodules worrisome for mets 1/19 concern for chest wall abscess rather than malignancy. Transferred to [facility] for TCTS eval 1/19 Irrigation and debridement L chest wall abscess, mini-thoracotomy, placement of VAC and L chest tube (Gerhardt)  Lines, Tubes, etc: ETT 1/19 >>  L IJ CVL 1/19 >>  R radial A-line 1/19 >>   Microbiology: MRSA PCR 1/17 >> POSITIVE Resp 1/17 >> abundant staph aureus >>  Blood 1/17 >> 2/2  MRSA >>   Antibiotics:  Osletamivir 1/17 >> 1/19 Zosyn 1/17 >>  Vanc 1/17 >>   Consults:  TCTS 1/19  Best Practice: DVT: LMWH 1/18 SUP: N/I Nutrition: Reg diet Glycemic control: N/I  Sedation/analgesia: MS contin, PCA dilaudid, PRN lorazepam  Subj: Returning now from OR after further debridement and clean-out L chest wall   Obj: Filed Vitals:   02/11/12 0855  BP: 126/52  Pulse: 83  Temp:   Resp: 26    Gen:  chronically ill appearing HEENT: WNL Neck: Fullness in L side of neck and supraclav region. No discrete nodes Chest: increased palpable mass over L pectoralis - now fluctuant. Diffuse scattered rhonchi Cardiac: tachy, reg, no M Abd: soft, NT, NABS Ext: warm, no edema, erythematous L middle finger with mild L forearm erythema  BMET  Lab 02/11/12 0359 02/10/12 1900 02/10/12 0358 02/09/12 0335 02/08/12 1417  NA 132* 132* 131* 127* 127*  K 3.9 4.0 -- -- --  CL 100 100 96 91* 86*  CO2 24 25 26 24 24   GLUCOSE 121* 135* 130* 116* 107*  BUN 29* 26* 29* 42* 52*  CREATININE 0.89 0.74 0.90 1.44* 2.49*  CALCIUM 7.7* 7.6* 8.6 8.7 9.4  MG -- -- -- 2.5 --  PHOS -- -- -- 4.1 --    CBC  Lab 02/11/12 0359 02/10/12 1900 02/10/12 0358  HGB 9.3* 9.7* 11.3*  HCT 26.9* 28.6* 33.3*  WBC 25.6* 25.8* 33.5*  PLT 219 171 208    CXR: NSC LUL opacity, increased B AS dz, L chest tube  IMPRESSION: Patient Active Problem List  Diagnosis  . H/O HTN  . H/O hypercholesteremia  . H/O stroke  . Acute respiratory failure  . Hypoxemia  . Pain, chest wall  . Chest wall abscess  . Lung abscess  . Staphylococcus aureus bacteremia with sepsis  . Status post thoracotomy, 1/19 Tyrone Sage   PLAN/RECS:  ABG and CXR now (ETT was changed in the OR) Abx as above, ? Whether  we can d/c zosyn. Will defer this decision to ID Transferred to [facility] for TCTS eval, s/p further debridement of L chest wall and pleural infection 1/20 ? Timing for repeat imaging - still has a L lung abscess (? Whether he will need lobectomy to adequately treat), he did also have R sided pleural involvement; will require repeat Ct scan at some point Will f/u TTE to screen for bacterial endocarditis given bacteremia and pattern of B rounded infiltrates Will not extubate 1/20 as he may be returning for further surgery - discuss w Dr Tyrone Sage before extubation Note he will likely be at risk for EtOH withdrawal when we move to extubation Acute  renal failure resolved with volume resuscitation - follow BMP  40 minutes CC time   Levy Pupa, MD, PhD 02/11/2012, 9:23 AM [facility] [phone] or if no answer [phone]

## 2012-02-11 NOTE — Transfer of Care (Signed)
Immediate Anesthesia Transfer of Care Note  Patient: Frank Brock  Procedure(s) Performed: Procedure(s) (LRB) with comments: CHEST EXPLORATION (Left) - re-exploration of left chest wall; with wound vac change MINOR APPLICATION OF WOUND VAC (Left) - wound vac change  Patient Location: SICU  Anesthesia Type:General  Level of Consciousness: sedated and Patient remains intubated per anesthesia plan  Airway & Oxygen Therapy: Patient remains intubated per anesthesia plan and Patient placed on Ventilator (see vital sign flow sheet for setting)  Post-op Assessment: Report given to PACU RN and Post -op Vital signs reviewed and stable  Post vital signs: Reviewed and stable  Complications: No apparent anesthesia complications 

## 2012-02-11 NOTE — Progress Notes (Signed)
Utilization review completed.  P.J. Mahala Rommel,RN,BSN Case Manager [phone]  

## 2012-02-11 NOTE — Progress Notes (Signed)
Pt noted to be hot and clammy with turning; oral temp revealed temp of 102.4 f; cooling blanket placed under Pt with top sheet between Pt and cooling blanket, rectal probe placed and revealed core temp of 102.3 f; will cont' to monitor and assess.

## 2012-02-11 NOTE — Progress Notes (Signed)
*  PRELIMINARY RESULTS* Echocardiogram 2D Echocardiogram has been performed.  Frank Brock 02/11/2012, 11:01 AM

## 2012-02-11 NOTE — Progress Notes (Signed)
eLink Physician-Brief Progress Note Patient Name: Frank Brock DOB: 17-Jul-1960 MRN: [id number]  Date of Service  02/11/2012   HPI/Events of Note  Extreme agitation, RASS +4, hypertensive, pain treated with fent bolus x 2, then versed    eICU Interventions  Add propofol gtt   Intervention Category Major Interventions: Other:  [name] V. 02/11/2012, 5:36 PM

## 2012-02-11 NOTE — Op Note (Signed)
Frank Brock, DUECKER NO.:  [id number]  MEDICAL RECORD NO.:  [id number]  LOCATION:  2311                         FACILITY:  [facility]  PHYSICIAN:  Sheliah Plane, MD    DATE OF BIRTH:  Dec 04, 1960  DATE OF PROCEDURE:  02/10/2012 DATE OF DISCHARGE:                              OPERATIVE REPORT   PREOPERATIVE DIAGNOSIS:  Chest wall abscess secondary to Staph aureus with septicemia.  POSTOPERATIVE DIAGNOSIS:  Chest wall abscess secondary to Staph aureus with septicemia.  SURGICAL PROCEDURE:  Exploration of left chest wall and mini thoracotomy with evacuation of chest wall abscess and mini thoracotomy with evacuation of intrapleural abscess, placement of chest tube, placement of wound VAC.  SURGEON:  Sheliah Plane, MD  FIRST ASSISTANT:  Rowe Clack, PA  BRIEF HISTORY:  The patient is a 52 year old male who was originally thought to have a left chest wall tumor mass, however, he presented with increasing renal failure.  Positive blood cultures for Staph and Thoracic Surgery consultation was obtained at this point.  On review of the scans and previous x-rays, it appeared that and on physical exam, that the patient had a chest wall abscess with partial intrapleural component.  The patient was in respiratory distress with bilateral pulmonary infiltrates and was transferred urgently to [facility] and underwent surgical procedure.  The patient was unable to sign consent, consent  was obtained from his wife.  DESCRIPTION OF THE PROCEDURE:  The patient underwent general endotracheal anesthesia.  The chest was prepped with Betadine and draped in sterile manner.  Initially the patient was made left superior anterior chest wall.  The pectoralis muscle fibers were split and separated.  The subpectoralis tissue plane was entered with copious amounts of white purulent material returned,  this area extended inferiorly and laterally, the entire cavity was debrided well.  An excisional debridement cutting away necrotic tissue was carried out sharply with scalpel. The size of the cavity outside of the chest wall was 9 cm x 5 cm. In addition an area of approximately 3 x 3 cm intrathoracic was also evident. Tissue was very friable and bloody.  The area was tracked down between the second and third ribs, this area was opened further and a cavity intrapleural was also drained of purulent material. The left chest was entered between the second and third rib allowing drainage of this area in addition. The ribs were intact and not removed. The left chest tube was placed.  Using a Pulsavac the entire wound was irrigated after sharp debridement was carried out.  The patient did require  1 unit of packed red cells because of preoperative anemia and estimated 500-600 mL of blood loss only with the operative field hemostatic.  A wound VAC device was placed in the wound and secured as was the left chest tube.  It was decided that with the extent of infection, the patient would be brought back to the operating room 24 hours for reexploration and replacement of the wound VAC.  The patient was transferred to the Surgical Intensive Care Unit in critical condition.  Sponge and needle count was reported as correct at  the completion of procedure.  Sheliah Plane, MD     EG/MEDQ  D:  02/11/2012  T:  02/11/2012  Job:  [id number]

## 2012-02-11 NOTE — Anesthesia Preprocedure Evaluation (Addendum)
Anesthesia Evaluation  Patient identified by MRN, date of birth, ID band  Reviewed: Allergy & Precautions, H&P , NPO status , Patient's Chart, lab work & pertinent test results  Airway Mallampati: I TM Distance: >3 FB Neck ROM: full    Dental   Pulmonary          Cardiovascular hypertension, Rhythm:regular Rate:Normal     Neuro/Psych CVA    GI/Hepatic   Endo/Other    Renal/GU      Musculoskeletal   Abdominal   Peds  Hematology   Anesthesia Other Findings Pt intubated from ICU.  Reproductive/Obstetrics                          Anesthesia Physical Anesthesia Plan  ASA: II  Anesthesia Plan: General   Post-op Pain Management:    Induction: Intravenous  Airway Management Planned: Oral ETT  Additional Equipment:   Intra-op Plan:   Post-operative Plan: Extubation in OR  Informed Consent: I have reviewed the patients History and Physical, chart, labs and discussed the procedure including the risks, benefits and alternatives for the proposed anesthesia with the patient or authorized representative who has indicated his/her understanding and acceptance.     Plan Discussed with: CRNA, Anesthesiologist and Surgeon  Anesthesia Plan Comments:         Anesthesia Quick Evaluation

## 2012-02-11 NOTE — Consult Note (Signed)
[facility]    Date of Admission:  02/08/2012  Date of Consult:  02/11/2012  Reason for Consult staph aureus bacteremia and chest wall abscess Referring Physician: Auto consult for staph aureus bacteremia and Dr.Yacoub   HPI: Frank Brock is an 51 y.o. male  With hx of prior CVA, HTN hyperlipidemia, smoking developed Small lesion on middle finger of L hand around 1/03. Developed severe upper L chest pain 1/08 and seen by primary MD 1/09. CXR normal @ that time. 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 Seen by Onc 1/15 and plans for eval of presumed lung cancer. Admitted via ED 1/17 with severe chest pain, hypoxemia, presumed PNA and continued presumption of LUL malignancy. Blood cultures drawn, abx started. Blood cx's turned positive on 1/18 ultimately growing MRSA in 2/2. In the interim he had deteriorated and CT showed suspected to was in fact a Large left-sided chest wall abscess communicates with the adjacent intrathoracic/lung abscess. He was transported emergently them at [facility] long to [facility] for emergent cardiothoracic surgery and yesterday underwent prior thoracic surgery with exploration of chest wound, minithoracotomy evacuation of chest wall abscess and intrapleural abscess with placement of chest tube and a wound vacuum dressing. He is currently on vancomycin still on the ventilator sedated.      Past Medical History  Diagnosis Date  . Hypercholesteremia 02/06/2012  . Stroke 02/06/2012    09/13 secondary to hypertensive crisis.  . Lung cancer     Past Surgical History  Procedure Date  . Inguinal hernia repair   ergies:   No Known Allergies   Medications: I have reviewed patients current medications as documented in Epic Anti-infectives     Start     Dose/Rate Route Frequency Ordered Stop   02/11/12 1100   vancomycin (VANCOCIN) IVPB 1000 mg/200 mL premix         1,000 mg 200 mL/hr over 60 Minutes Intravenous Every 8 hours 02/11/12 1042     02/11/12 0045   vancomycin (VANCOCIN) IVPB 1000 mg/200 mL premix  Status:  Discontinued     Comments: CONTINUE TILL FURTHER NOTICE      1,000 mg 200 mL/hr over 60 Minutes Intravenous Every 12 hours 02/10/12 1803 02/11/12 1041   02/10/12 2200   ceFAZolin (ANCEF) IVPB 2 g/50 mL premix  Status:  Discontinued        2 g 100 mL/hr over 30 Minutes Intravenous 3 times per day 02/10/12 1610 02/10/12 1755   02/10/12 2200  piperacillin-tazobactam (ZOSYN) IVPB 3.375 g       3.375 g 12.5 mL/hr over 240 Minutes Intravenous 3 times per day 02/10/12 1834     02/10/12 1400   vancomycin (VANCOCIN) IVPB 1000 mg/200 mL premix  Status:  Discontinued        1,000 mg 200 mL/hr over 60 Minutes Intravenous Every 8 hours 02/10/12 1111 02/10/12 1755   02/10/12 1200   cefTRIAXone (ROCEPHIN) 1 g in dextrose 5 % 50 mL IVPB  Status:  Discontinued        1 g 100 mL/hr over 30 Minutes Intravenous Every 12 hours 02/10/12 1108 02/10/12 1610   02/09/12 1800   vancomycin (VANCOCIN) IVPB 1000 mg/200 mL premix  Status:  Discontinued        1,000 mg 200 mL/hr over 60 Minutes Intravenous Every 12 hours 02/09/12 1318 02/10/12 1111   02/09/12 0600  vancomycin (VANCOCIN) 1,250 mg in sodium chloride 0.9 % 250 mL IVPB  Status:  Discontinued        1,250 mg 166.7 mL/hr over 90 Minutes Intravenous Every 24 hours 02/08/12 1720 02/09/12 1318   02/08/12 2200   piperacillin-tazobactam (ZOSYN) IVPB 3.375 g  Status:  Discontinued        3.375 g 12.5 mL/hr over 240 Minutes Intravenous 3 times per day 02/08/12 1649 02/10/12 1108   02/08/12 2000   oseltamivir (TAMIFLU) capsule 75 mg  Status:  Discontinued        75 mg Oral 2 times daily 02/08/12 1720 02/10/12 1108   02/08/12 1700   vancomycin (VANCOCIN) IVPB 1000 mg/200 mL premix  Status:  Discontinued        1,000 mg 200 mL/hr over 60 Minutes Intravenous Every 12 hours 02/08/12 1649 02/08/12  1720   02/08/12 1530   vancomycin (VANCOCIN) IVPB 1000 mg/200 mL premix        1,000 mg 200 mL/hr over 60 Minutes Intravenous  Once 02/08/12 1527 02/08/12 2000   02/08/12 1530  piperacillin-tazobactam (ZOSYN) IVPB 3.375 g       3.375 g 12.5 mL/hr over 240 Minutes Intravenous  Once 02/08/12 1527 02/08/12 2004          Social History:  reports that he has been smoking.  He has never used smokeless tobacco. He reports that he drinks about 1.8 ounces of alcohol per week. He reports that he does not use illicit drugs.  Family History  Problem Relation Age of Onset  . Hypothyroidism Mother     As in HPI and primary teams notes otherwise 12 point review of systems not obtainable  Lines, Tubes, etc:  ETT 1/19 >>  L IJ CVL 1/19 >>  R radial A-line 1/19 >>   Blood pressure 136/58, pulse 83, temperature 97.3 F (36.3 C), temperature source Oral, resp. rate 19, height 5\' 10"  (1.778 m), weight 193 lb 2 oz (87.6 kg), SpO2 100.00%. General: Dated and sedated. HEENT: anicteric sclera, pupils reactive to light and accommodation, intubated CVS regular rate, normal r,  no murmur rubs or gallops Chest: Coarse rhonchi throughout i Abdomen: soft nondistended, normal bowel sounds, Extremities: Left hand with erythematous lesion on knuckle   Neuro: nonfocal, strength and sensation intact   Results for orders placed during the hospital encounter of 02/08/12 (from the past 48 hour(s))  CULTURE, BLOOD (ROUTINE X 2)     Status: Normal (Preliminary result)   Collection Time   02/09/12  7:50 PM      Component Value Range Comment   Specimen Description Blood      Special Requests NONE      Culture  Setup Time 02/10/2012 02:13      Culture        Value:        BLOOD CULTURE RECEIVED NO GROWTH TO DATE CULTURE WILL BE HELD FOR 5 DAYS BEFORE ISSUING A FINAL NEGATIVE REPORT   Report Status PENDING     CULTURE, BLOOD (ROUTINE X 2)     Status: Normal (Preliminary result)   Collection Time   02/09/12   7:55 PM      Component Value Range Comment   Specimen Description Blood      Special Requests NONE      Culture  Setup Time 02/10/2012 02:13      Culture        Value:        BLOOD CULTURE RECEIVED NO  GROWTH TO DATE CULTURE WILL BE HELD FOR 5 DAYS BEFORE ISSUING A FINAL NEGATIVE REPORT   Report Status PENDING     PRO B NATRIURETIC PEPTIDE     Status: Abnormal   Collection Time   02/10/12  3:58 AM      Component Value Range Comment   Pro B Natriuretic peptide (BNP) 707.7 (*) 0 - 125 pg/mL   CBC     Status: Abnormal   Collection Time   02/10/12  3:58 AM      Component Value Range Comment   WBC 33.5 (*) 4.0 - 10.5 K/uL    RBC 3.80 (*) 4.22 - 5.81 MIL/uL    Hemoglobin 11.3 (*) 13.0 - 17.0 g/dL    HCT 16.1 (*) 09.6 - 52.0 %    MCV 87.6  78.0 - 100.0 fL    MCH 29.7  26.0 - 34.0 pg    MCHC 33.9  30.0 - 36.0 g/dL    RDW 04.5  40.9 - 81.1 %    Platelets 208  150 - 400 K/uL   BASIC METABOLIC PANEL     Status: Abnormal   Collection Time   02/10/12  3:58 AM      Component Value Range Comment   Sodium 131 (*) 135 - 145 mEq/L    Potassium 4.2  3.5 - 5.1 mEq/L    Chloride 96  96 - 112 mEq/L    CO2 26  19 - 32 mEq/L    Glucose, Bld 130 (*) 70 - 99 mg/dL    BUN 29 (*) 6 - 23 mg/dL    Creatinine, Ser 9.14  0.50 - 1.35 mg/dL    Calcium 8.6  8.4 - 78.2 mg/dL    GFR calc non Af Amer >90  >90 mL/min    GFR calc Af Amer >90  >90 mL/min   GLUCOSE, CAPILLARY     Status: Abnormal   Collection Time   02/10/12 12:31 PM      Component Value Range Comment   Glucose-Capillary 108 (*) 70 - 99 mg/dL    Comment 1 Documented in Chart      Comment 2 Notify RN     TYPE AND SCREEN     Status: Normal (Preliminary result)   Collection Time   02/10/12  3:30 PM      Component Value Range Comment   ABO/RH(D) A NEG      Antibody Screen NEG      Sample Expiration 02/13/2012      Unit Number [id number]      Blood Component Type RED CELLS,LR      Unit division 00      Status of Unit ISSUED,FINAL       Transfusion Status OK TO TRANSFUSE      Crossmatch Result Compatible      Unit Number [id number]      Blood Component Type RED CELLS,LR      Unit division 00      Status of Unit ISSUED,FINAL      Transfusion Status OK TO TRANSFUSE      Crossmatch Result Compatible      Unit Number [id number]      Blood Component Type RED CELLS,LR      Unit division 00      Status of Unit ALLOCATED      Transfusion Status OK TO TRANSFUSE      Crossmatch Result Compatible      Unit Number [id number]  Blood Component Type RBC LR PHER1      Unit division 00      Status of Unit ALLOCATED      Transfusion Status OK TO TRANSFUSE      Crossmatch Result Compatible      Unit Number [id number]      Blood Component Type RBC LR PHER2      Unit division 00      Status of Unit ALLOCATED      Transfusion Status OK TO TRANSFUSE      Crossmatch Result Compatible      Unit Number [id number]      Blood Component Type RBC LR PHER1      Unit division 00      Status of Unit ALLOCATED      Transfusion Status OK TO TRANSFUSE      Crossmatch Result Compatible     ABO/RH     Status: Normal   Collection Time   02/10/12  3:30 PM      Component Value Range Comment   ABO/RH(D) A NEG     GRAM STAIN     Status: Normal   Collection Time   02/10/12  4:32 PM      Component Value Range Comment   Specimen Description ABSCESS LEFT CHEST      Special Requests PATIENT ON FOLLOWING ANCEF VANC      Gram Stain        Value: ABUNDANT WBC PRESENT, PREDOMINANTLY PMN     ABUNDANT GRAM POSITIVE COCCI IN CLUSTERS     Gram Stain Report Called to,Read Back By and Verified With: P.WEATHERLY,RN 02/10/12 1725 EHOWARD   Report Status 02/10/2012 FINAL     CULTURE, ROUTINE-ABSCESS     Status: Normal (Preliminary result)   Collection Time   02/10/12  4:34 PM      Component Value Range Comment   Specimen Description ABSCESS LEFT CHEST      Special Requests PATIENT ON FOLLOWING VANC ANCEF      Gram Stain PENDING       Culture Culture reincubated for better growth      Report Status PENDING     CBC     Status: Abnormal   Collection Time   02/10/12  7:00 PM      Component Value Range Comment   WBC 25.8 (*) 4.0 - 10.5 K/uL    RBC 3.24 (*) 4.22 - 5.81 MIL/uL    Hemoglobin 9.7 (*) 13.0 - 17.0 g/dL    HCT 24.4 (*) 01.0 - 52.0 %    MCV 88.3  78.0 - 100.0 fL    MCH 29.9  26.0 - 34.0 pg    MCHC 33.9  30.0 - 36.0 g/dL    RDW 27.2 (*) 53.6 - 15.5 %    Platelets 171  150 - 400 K/uL   COMPREHENSIVE METABOLIC PANEL     Status: Abnormal   Collection Time   02/10/12  7:00 PM      Component Value Range Comment   Sodium 132 (*) 135 - 145 mEq/L    Potassium 4.0  3.5 - 5.1 mEq/L    Chloride 100  96 - 112 mEq/L    CO2 25  19 - 32 mEq/L    Glucose, Bld 135 (*) 70 - 99 mg/dL    BUN 26 (*) 6 - 23 mg/dL    Creatinine, Ser 6.44  0.50 - 1.35 mg/dL    Calcium 7.6 (*) 8.4 - 10.5 mg/dL  Total Protein 5.4 (*) 6.0 - 8.3 g/dL    Albumin 1.8 (*) 3.5 - 5.2 g/dL    AST 956 (*) 0 - 37 U/L    ALT 53  0 - 53 U/L    Alkaline Phosphatase 174 (*) 39 - 117 U/L    Total Bilirubin 2.1 (*) 0.3 - 1.2 mg/dL    GFR calc non Af Amer >90  >90 mL/min    GFR calc Af Amer >90  >90 mL/min   PROTIME-INR     Status: Abnormal   Collection Time   02/10/12  7:00 PM      Component Value Range Comment   Prothrombin Time 16.7 (*) 11.6 - 15.2 seconds    INR 1.39  0.00 - 1.49   APTT     Status: Normal   Collection Time   02/10/12  7:00 PM      Component Value Range Comment   aPTT 34  24 - 37 seconds   POCT I-STAT 3, BLOOD GAS (G3+)     Status: Abnormal   Collection Time   02/10/12  7:01 PM      Component Value Range Comment   pH, Arterial 7.191 (*) 7.350 - 7.450    pCO2 arterial 70.1 (*) 35.0 - 45.0 mmHg    pO2, Arterial 103.0 (*) 80.0 - 100.0 mmHg    Bicarbonate 27.0 (*) 20.0 - 24.0 mEq/L    TCO2 29  0 - 100 mmol/L    O2 Saturation 96.0      Acid-base deficit 2.0  0.0 - 2.0 mmol/L    Patient temperature 98.1 F      Collection site  ARTERIAL LINE      Drawn by Nurse      Sample type ARTERIAL      Comment NOTIFIED PHYSICIAN     POCT I-STAT 3, BLOOD GAS (G3+)     Status: Abnormal   Collection Time   02/10/12  7:45 PM      Component Value Range Comment   pH, Arterial 7.276 (*) 7.350 - 7.450    pCO2 arterial 55.3 (*) 35.0 - 45.0 mmHg    pO2, Arterial 115.0 (*) 80.0 - 100.0 mmHg    Bicarbonate 25.7 (*) 20.0 - 24.0 mEq/L    TCO2 27  0 - 100 mmol/L    O2 Saturation 98.0      Acid-base deficit 1.0  0.0 - 2.0 mmol/L    Patient temperature 98.7 F      Collection site ARTERIAL LINE      Drawn by Nurse      Sample type ARTERIAL     URINALYSIS, ROUTINE W REFLEX MICROSCOPIC     Status: Abnormal   Collection Time   02/10/12  9:05 PM      Component Value Range Comment   Color, Urine ORANGE (*) YELLOW BIOCHEMICALS MAY BE AFFECTED BY COLOR   APPearance CLOUDY (*) CLEAR    Specific Gravity, Urine 1.022  1.005 - 1.030    pH 6.0  5.0 - 8.0    Glucose, UA NEGATIVE  NEGATIVE mg/dL    Hgb urine dipstick TRACE (*) NEGATIVE    Bilirubin Urine MODERATE (*) NEGATIVE    Ketones, ur 15 (*) NEGATIVE mg/dL    Protein, ur 30 (*) NEGATIVE mg/dL    Urobilinogen, UA >2.1 (*) 0.0 - 1.0 mg/dL    Nitrite NEGATIVE  NEGATIVE    Leukocytes, UA TRACE (*) NEGATIVE   URINE MICROSCOPIC-ADD ON  Status: Abnormal   Collection Time   02/10/12  9:05 PM      Component Value Range Comment   Squamous Epithelial / LPF FEW (*) RARE    WBC, UA 0-2  <3 WBC/hpf    RBC / HPF 0-2  <3 RBC/hpf    Bacteria, UA FEW (*) RARE    Casts HYALINE CASTS (*) NEGATIVE   BASIC METABOLIC PANEL     Status: Abnormal   Collection Time   02/11/12  3:59 AM      Component Value Range Comment   Sodium 132 (*) 135 - 145 mEq/L    Potassium 3.9  3.5 - 5.1 mEq/L    Chloride 100  96 - 112 mEq/L    CO2 24  19 - 32 mEq/L    Glucose, Bld 121 (*) 70 - 99 mg/dL    BUN 29 (*) 6 - 23 mg/dL    Creatinine, Ser 1.61  0.50 - 1.35 mg/dL    Calcium 7.7 (*) 8.4 - 10.5 mg/dL    GFR calc non  Af Amer >90  >90 mL/min    GFR calc Af Amer >90  >90 mL/min   CBC     Status: Abnormal   Collection Time   02/11/12  3:59 AM      Component Value Range Comment   WBC 25.6 (*) 4.0 - 10.5 K/uL    RBC 3.12 (*) 4.22 - 5.81 MIL/uL    Hemoglobin 9.3 (*) 13.0 - 17.0 g/dL    HCT 09.6 (*) 04.5 - 52.0 %    MCV 86.2  78.0 - 100.0 fL    MCH 29.8  26.0 - 34.0 pg    MCHC 34.6  30.0 - 36.0 g/dL    RDW 40.9 (*) 81.1 - 15.5 %    Platelets 219  150 - 400 K/uL   PROCALCITONIN     Status: Normal   Collection Time   02/11/12  3:59 AM      Component Value Range Comment   Procalcitonin 2.81     POCT I-STAT 3, BLOOD GAS (G3+)     Status: Abnormal   Collection Time   02/11/12  3:59 AM      Component Value Range Comment   pH, Arterial 7.396  7.350 - 7.450    pCO2 arterial 39.9  35.0 - 45.0 mmHg    pO2, Arterial 162.0 (*) 80.0 - 100.0 mmHg    Bicarbonate 24.0  20.0 - 24.0 mEq/L    TCO2 25  0 - 100 mmol/L    O2 Saturation 99.0      Patient temperature 102.2 F      Collection site ARTERIAL LINE      Drawn by Nurse      Sample type ARTERIAL     POCT I-STAT 3, BLOOD GAS (G3+)     Status: Abnormal   Collection Time   02/11/12 10:39 AM      Component Value Range Comment   pH, Arterial 7.420  7.350 - 7.450    pCO2 arterial 38.4  35.0 - 45.0 mmHg    pO2, Arterial 120.0 (*) 80.0 - 100.0 mmHg    Bicarbonate 25.1 (*) 20.0 - 24.0 mEq/L    TCO2 26  0 - 100 mmol/L    O2 Saturation 99.0      Patient temperature 97.3 F      Sample type ARTERIAL         Component Value Date/Time   SDES ABSCESS LEFT CHEST  02/10/2012 1634   SPECREQUEST PATIENT ON FOLLOWING VANC ANCEF 02/10/2012 1634   CULT Culture reincubated for better growth 02/10/2012 1634   REPTSTATUS PENDING 02/10/2012 1634   Ct Chest Wo Contrast  02/10/2012  *RADIOLOGY REPORT*  Clinical Data: Chest wall abscess.  CT CHEST WITHOUT CONTRAST  Technique:  Multidetector CT imaging of the chest was performed following the standard protocol without IV contrast.   Comparison: 02/04/2012.  Findings: There is a large left-sided chest wall abscess containing fluid and air.  This is in the subpectoral space.  This is contiguous with a large intrathoracic/lung abscess.  This is likely an aggressive infection.  No destructive bony changes to suggest osteomyelitis.  Since the prior study there is been a rapidly progressive multifocal infectious process in the lungs with multi focal infiltrates and probable small/early pulmonary abscesses.  Septic emboli is a strong possibility.  There are also bilateral pleural effusions, right greater than left.  The upper abdomen is stable.  IMPRESSION:  1.  Large left-sided chest wall abscess communicates with the adjacent intrathoracic/lung abscess. 2.  Interval development of rapidly progressive bilateral pulmonary infections.  This appears be a combination of infiltrate and septic emboli. 3.  Enlarged mediastinal and hilar lymph nodes.   Original Report Authenticated By: Rudie Meyer, M.D.    Dg Chest Port 1 View  02/11/2012  *RADIOLOGY REPORT*  Clinical Data: Endotracheal tube placement.  PORTABLE CHEST - 1 VIEW  Comparison: 02/11/2012 at 0631 hours.  Findings: Endotracheal tube is in satisfactory position.  Left IJ central line tip projects over the SVC.  Left chest tube is in place.  Nasogastric tube is followed into the stomach.  Heart size normal. Biapical pleural thickening.  Patchy bilateral air space disease persists.  No pneumothorax.  No definite pleural fluid.  IMPRESSION:  1.  Endotracheal tube is in satisfactory position. 2.  Patchy bilateral air space disease. 3.  Left chest tube in place.  No definite pneumothorax.   Original Report Authenticated By: Leanna Battles, M.D.    Dg Chest Port 1 View  02/11/2012  *RADIOLOGY REPORT*  Clinical Data: Postop wound exploration with irrigation and debridement of abscess  PORTABLE CHEST - 1 VIEW  Comparison: Portable chest x-ray of 02/10/2012  Findings: The tip of the endotracheal tube  is 7.8 cm above the carina.  A left IJ central venous line tip is unchanged overlying the upper SVC.  A left chest tube is present and no left pneumothorax is noted.  Patchy airspace disease remains bilaterally.  Mild cardiomegaly is stable.  IMPRESSION: Stable chest x-ray with patchy airspace disease.  Endotracheal tube, left IJ central venous line, and left chest tube are unchanged.   Original Report Authenticated By: Dwyane Dee, M.D.    Dg Chest Port 1 View  02/10/2012  *RADIOLOGY REPORT*  Clinical Data: Central line placement.  PORTABLE CHEST - 1 VIEW  Comparison: Chest radiograph performed earlier today at 07:25 p.m.  Findings: The patient's left IJ line is seen ending about the proximal SVC.  An enteric tube is noted extending below the diaphragm.  The endotracheal tube is seen ending 5 cm above the carina.  There is persistent bilateral airspace opacification, favoring the right lung base and left lung apex.  As noted on recent CT, left apical airspace opacification reflects an intrathoracic abscess.  A left basilar chest tube is unchanged in position.  No pleural effusion or pneumothorax is seen.  The cardiomediastinal silhouette is normal in size.  No acute osseous abnormalities  are identified.  Chronic right-sided rib deformities are again seen.  IMPRESSION:  1.  Left IJ line seen ending about the proximal SVC. 2.  Persistent bilateral airspace opacification again noted; as noted on recent CT, left apical airspace opacification reflects an intrathoracic abscess.   Original Report Authenticated By: Tonia Ghent, M.D.    Dg Chest Port 1 View  02/10/2012  *RADIOLOGY REPORT*  Clinical Data: Decompression of chest wall abscess.  PORTABLE CHEST - 1 VIEW  Comparison: 02/10/2012.  Findings: The endotracheal tube is in good position, 6 cm above the carina.  The NG tube is in the stomach.  A left-sided chest tube is present.  No definite left-sided pneumothorax.  The lungs show slight improved aeration.   There are persistent multifocal infiltrates.  IMPRESSION:  1.  Postoperative support apparatus in good position. 2.  No pneumothorax. 3.  Improved lung aeration but persistent bilateral infiltrates.   Original Report Authenticated By: Rudie Meyer, M.D.    Dg Chest Port 1 View  02/10/2012  *RADIOLOGY REPORT*  Clinical Data: Follow-up respiratory failure.  Lung cancer.  PORTABLE CHEST - 1 VIEW  Comparison: 02/09/2012  Findings: Dense area of consolidation in the left upper lung with diffuse patchy infiltrates throughout both lungs.  Increasing density of infiltration in the right and left midlung since previous study suggest progression.  Findings likely represent multifocal pneumonia.  No blunting of costophrenic angles.  No pneumothorax.  IMPRESSION: Increasing changes of bilateral consolidation since previous study suggesting progression of multifocal pneumonia.   Original Report Authenticated By: Burman Nieves, M.D.      Recent Results (from the past 720 hour(s))  TECHNOLOGIST REVIEW     Status: Normal   Collection Time   02/06/12  9:42 AM      Component Value Range Status Comment   Technologist Review Occ Metas and Myelocytes present   Final   CULTURE, BLOOD (ROUTINE X 2)     Status: Normal   Collection Time   02/08/12  3:39 PM      Component Value Range Status Comment   Specimen Description BLOOD RIGHT ANTECUBITAL   Final    Special Requests BOTTLES DRAWN AEROBIC AND ANAEROBIC 5CC   Final    Culture  Setup Time 02/08/2012 23:18   Final    Culture     Final    Value: METHICILLIN RESISTANT STAPHYLOCOCCUS AUREUS     Note: RIFAMPIN AND GENTAMICIN SHOULD NOT BE USED AS SINGLE DRUGS FOR TREATMENT OF STAPH INFECTIONS. This organism DOES NOT demonstrate inducible Clindamycin resistance in vitro. CRITICAL RESULT CALLED TO, READ BACK BY AND VERIFIED WITH: MANDY ROLLS      02/10/12 @ 9:03PM BY RUSCA.     Note: Gram Stain Report Called to,Read Back By and Verified With: MEELY RICHARDSON 02/09/12 @  2:26PM BY RUSCA.   Report Status 02/11/2012 FINAL   Final    Organism ID, Bacteria METHICILLIN RESISTANT STAPHYLOCOCCUS AUREUS   Final   CULTURE, BLOOD (ROUTINE X 2)     Status: Normal   Collection Time   02/08/12  3:49 PM      Component Value Range Status Comment   Specimen Description BLOOD LEFT HAND   Final    Special Requests BOTTLES DRAWN AEROBIC ONLY 2CC   Final    Culture  Setup Time 02/08/2012 23:19   Final    Culture     Final    Value: STAPHYLOCOCCUS AUREUS     Note: SUSCEPTIBILITIES PERFORMED ON PREVIOUS CULTURE WITHIN THE  LAST 5 DAYS. CRITICAL RESULT CALLED TO, READ BACK BY AND VERIFIED WITH: MANDY ROLLS 02/10/12 @ 9:03PM BY RUSCA.     Note: Gram Stain Report Called to,Read Back By and Verified With: MEELY RICHARDSON 02/09/12 @ 2:26PM BY RUSCA.   Report Status 02/11/2012 FINAL   Final   URINE CULTURE     Status: Normal   Collection Time   02/08/12  4:58 PM      Component Value Range Status Comment   Specimen Description URINE, CATHETERIZED   Final    Special Requests NONE   Final    Culture  Setup Time 02/09/2012 01:48   Final    Colony Count NO GROWTH   Final    Culture NO GROWTH   Final    Report Status 02/10/2012 FINAL   Final   CULTURE, EXPECTORATED SPUTUM-ASSESSMENT     Status: Normal   Collection Time   02/08/12  5:54 PM      Component Value Range Status Comment   Specimen Description SPUTUM   Final    Special Requests NONE   Final    Sputum evaluation     Final    Value: THIS SPECIMEN IS ACCEPTABLE. RESPIRATORY CULTURE REPORT TO FOLLOW.   Report Status 02/08/2012 FINAL   Final   MRSA PCR SCREENING     Status: Abnormal   Collection Time   02/08/12  5:54 PM      Component Value Range Status Comment   MRSA by PCR POSITIVE (*) NEGATIVE Final   CULTURE, RESPIRATORY     Status: Normal (Preliminary result)   Collection Time   02/08/12  5:54 PM      Component Value Range Status Comment   Specimen Description SPUTUM   Final    Special Requests NONE   Final    Gram  Stain     Final    Value: ABUNDANT WBC PRESENT,BOTH PMN AND MONONUCLEAR     RARE SQUAMOUS EPITHELIAL CELLS PRESENT     ABUNDANT GRAM POSITIVE COCCI     IN PAIRS IN CLUSTERS   Culture     Final    Value: ABUNDANT STAPHYLOCOCCUS AUREUS     Note: RIFAMPIN AND GENTAMICIN SHOULD NOT BE USED AS SINGLE DRUGS FOR TREATMENT OF STAPH INFECTIONS.   Report Status PENDING   Incomplete   CULTURE, BLOOD (ROUTINE X 2)     Status: Normal (Preliminary result)   Collection Time   02/09/12  7:50 PM      Component Value Range Status Comment   Specimen Description Blood   Final    Special Requests NONE   Final    Culture  Setup Time 02/10/2012 02:13   Final    Culture     Final    Value:        BLOOD CULTURE RECEIVED NO GROWTH TO DATE CULTURE WILL BE HELD FOR 5 DAYS BEFORE ISSUING A FINAL NEGATIVE REPORT   Report Status PENDING   Incomplete   CULTURE, BLOOD (ROUTINE X 2)     Status: Normal (Preliminary result)   Collection Time   02/09/12  7:55 PM      Component Value Range Status Comment   Specimen Description Blood   Final    Special Requests NONE   Final    Culture  Setup Time 02/10/2012 02:13   Final    Culture     Final    Value:        BLOOD CULTURE RECEIVED NO GROWTH TO  DATE CULTURE WILL BE HELD FOR 5 DAYS BEFORE ISSUING A FINAL NEGATIVE REPORT   Report Status PENDING   Incomplete   GRAM STAIN     Status: Normal   Collection Time   02/10/12  4:32 PM      Component Value Range Status Comment   Specimen Description ABSCESS LEFT CHEST   Final    Special Requests PATIENT ON FOLLOWING ANCEF VANC   Final    Gram Stain     Final    Value: ABUNDANT WBC PRESENT, PREDOMINANTLY PMN     ABUNDANT GRAM POSITIVE COCCI IN CLUSTERS     Gram Stain Report Called to,Read Back By and Verified With: P.WEATHERLY,RN 02/10/12 1725 EHOWARD   Report Status 02/10/2012 FINAL   Final   CULTURE, ROUTINE-ABSCESS     Status: Normal (Preliminary result)   Collection Time   02/10/12  4:34 PM      Component Value Range Status  Comment   Specimen Description ABSCESS LEFT CHEST   Final    Special Requests PATIENT ON FOLLOWING VANC ANCEF   Final    Gram Stain PENDING   Incomplete    Culture Culture reincubated for better growth   Final    Report Status PENDING   Incomplete      Impression/Recommendation  52 year old man with chest wall and lung abscess with methicillin-resistant staph aureus and MRSA bacteremia  #1  Methicillin Resistant Staphylococcus aureus bacteremia (SAB) is associated with a high rate of complications and mortality.  Specific aspects of clinical management are critical to optimizing the outcome of patients with SAB.  Therefore, the [facility] Antimicrobial Management Team ([facility]) have initiated an intervention aimed at improving the management of SAB at [facility].  To do so, Infectious Diseases Consultants are providing evidence-based recommendations for the management of all patients with SAB.  The specific recommendations for this patient are marked "X" in this document.  Recommended [x  ]  Completed [40981]  Perform follow-up blood cultures (even if the patient is afebrile) to ensure clearance of bacteremia.  Recommended [  ]  Completed [date]   Remove vascular catheter, and obtain follow-up cultures after removal of catheter  This gentleman is central line was placed on the 19th which is the day after his most recent blood cultures which so far negative. Should those cultures turn positive we'll need to central line removed  Recommended Arly.Keller  ]  Completed [[id number]]   Perform echocardiography to evaluate for endocarditis (transthoracic ECHO is 40-50% sensitive, TEE is > 90% sensitive).*  Please keep in mind, that neither test can definitively EXCLUDE endocarditis, and that should clinical suspicion remain high for endocarditis the patient should then still be treated with an "endocarditis" duration of therapy = 6 weeks   mAY WISH TO GET TEE IF TTE INCONCLUSIVE GIVEN COMPLICATED NATURE OF  THIS CASE AND HIGH LIKELIHOOD FOR ENDOCARDITIS.   Recommended [  ]  Completed [date]   Consult electrophysiologist to evaluate implanted cardiac device (pacemaker, ICD)  na  Recommended Arly.Keller  ]  Completed [id number]   Ensure source control. Ct SURGERY YESTERDAY  Have all abscesses been drained effectively? Have deep seeded infections (septic joints or osteomyelitis) had appropriate surgical debridement?  Recommended [ X ]  Completed [date]   Investigate for "metastatic" sites of infection.   Does the patient have ANY symptom or physical exam finding that would suggest a deeper infection (back or neck pain that may be suggestive of vertebral osteomyelitis or epidural abscess, muscle  pain that could be a symptom of pyomyositis)?  iINVESTIGATE WHEN PT OFF VENTILATOR AND CAN ANSWER QUESTIONS Keep in mind that for deep seeded infections MRI imaging with contrast is preferred rather than other often insensitive tests such as plain x-rays, especially early in a patient's presentation.   Recommended Arly.Keller  ] vANCOMYCIN goal trough should be 15 - 20 mg/L)  [ X ]  Estimated duration of IV  Antibiotic therapy:  6-8 WEEKS [ X ]  Consult case management for probable prolonged outpatient intravenous antibiotic therapy.  #2 MRSA CHEST WALL AND INTRATHORACIC ABSCESSES SP SURGERY --CT SURGERY DONE YESTERDAY --CONTINUE VANCOMYCIN AS ABOVE  #3 Screening: check HIV and hepatitis panel.  Thank you so much for this interesting consult  [facility] [phone] (pager) [phone] (office) 02/11/2012, 11:18 AM  Paulette Blanch Dam 02/11/2012, 11:18 AM

## 2012-02-11 NOTE — Progress Notes (Signed)
Revonda Standard from Dr. Dennie Maizes office stated pt does not have cancer.  I spoke with Dr. [name].  He stated to cancel appt due to information.  I will notify Rad Onc as well.

## 2012-02-11 NOTE — Progress Notes (Signed)
Resting quietly now Was agitated earlier  BP 144/64  Pulse 120  Temp 99.5 F (37.5 C) (Rectal)  Resp 32  Ht 5\' 10"  (1.778 m)  Wt 193 lb 2 oz (87.6 kg)  BMI 27.71 kg/m2  SpO2 97%   Intake/Output Summary (Last 24 hours) at 02/11/12 1829 Last data filed at 02/11/12 1800  Gross per 24 hour  Intake 2766.92 ml  Output   2785 ml  Net -18.08 ml    CT with minimal drainage  Continue current care

## 2012-02-11 NOTE — Preoperative (Signed)
Beta Blockers   Reason not to administer Beta Blockers:Not Applicable 

## 2012-02-12 ENCOUNTER — Inpatient Hospital Stay (HOSPITAL_COMMUNITY): Payer: Medicare HMO

## 2012-02-12 ENCOUNTER — Encounter (HOSPITAL_COMMUNITY): Payer: Medicare HMO

## 2012-02-12 DIAGNOSIS — Z9889 Other specified postprocedural states: Secondary | ICD-10-CM

## 2012-02-12 DIAGNOSIS — L02219 Cutaneous abscess of trunk, unspecified: Secondary | ICD-10-CM

## 2012-02-12 DIAGNOSIS — A4902 Methicillin resistant Staphylococcus aureus infection, unspecified site: Secondary | ICD-10-CM

## 2012-02-12 LAB — HEPATITIS PANEL, ACUTE
HCV Ab: NEGATIVE
Hep A IgM: NEGATIVE
Hep B C IgM: NEGATIVE

## 2012-02-12 LAB — CULTURE, RESPIRATORY W GRAM STAIN

## 2012-02-12 LAB — COMPREHENSIVE METABOLIC PANEL
AST: 97 U/L — ABNORMAL HIGH (ref 0–37)
Albumin: 1.6 g/dL — ABNORMAL LOW (ref 3.5–5.2)
Alkaline Phosphatase: 165 U/L — ABNORMAL HIGH (ref 39–117)
BUN: 21 mg/dL (ref 6–23)
Chloride: 104 mEq/L (ref 96–112)
Potassium: 3.8 mEq/L (ref 3.5–5.1)
Sodium: 138 mEq/L (ref 135–145)
Total Bilirubin: 1 mg/dL (ref 0.3–1.2)
Total Protein: 5.8 g/dL — ABNORMAL LOW (ref 6.0–8.3)

## 2012-02-12 LAB — C-REACTIVE PROTEIN: CRP: 15.2 mg/dL — ABNORMAL HIGH (ref <0.60)

## 2012-02-12 LAB — MAGNESIUM: Magnesium: 2.1 mg/dL (ref 1.5–2.5)

## 2012-02-12 LAB — POCT I-STAT 3, ART BLOOD GAS (G3+)
Bicarbonate: 24.2 mEq/L — ABNORMAL HIGH (ref 20.0–24.0)
O2 Saturation: 96 %
Patient temperature: 101.3
pO2, Arterial: 88 mmHg (ref 80.0–100.0)

## 2012-02-12 LAB — CBC
MCHC: 32.7 g/dL (ref 30.0–36.0)
Platelets: 332 10*3/uL (ref 150–400)
RDW: 16.2 % — ABNORMAL HIGH (ref 11.5–15.5)
WBC: 26.3 10*3/uL — ABNORMAL HIGH (ref 4.0–10.5)

## 2012-02-12 LAB — PHOSPHORUS: Phosphorus: 2.9 mg/dL (ref 2.3–4.6)

## 2012-02-12 LAB — VANCOMYCIN, TROUGH: Vancomycin Tr: 13.5 ug/mL (ref 10.0–20.0)

## 2012-02-12 LAB — SEDIMENTATION RATE: Sed Rate: 117 mm/hr — ABNORMAL HIGH (ref 0–16)

## 2012-02-12 MED ORDER — THIAMINE HCL 100 MG/ML IJ SOLN
100.0000 mg | Freq: Every day | INTRAMUSCULAR | Status: AC
  Administered 2012-02-13 – 2012-02-17 (×5): 100 mg via INTRAVENOUS
  Filled 2012-02-12 (×5): qty 1

## 2012-02-12 MED ORDER — OXEPA PO LIQD
1000.0000 mL | ORAL | Status: DC
  Administered 2012-02-12: 1000 mL
  Filled 2012-02-12 (×6): qty 1000

## 2012-02-12 MED ORDER — FOLIC ACID 5 MG/ML IJ SOLN
1.0000 mg | Freq: Every day | INTRAMUSCULAR | Status: AC
  Administered 2012-02-12 – 2012-02-16 (×5): 1 mg via INTRAVENOUS
  Filled 2012-02-12 (×5): qty 0.2

## 2012-02-12 MED ORDER — VANCOMYCIN HCL 10 G IV SOLR
1250.0000 mg | Freq: Three times a day (TID) | INTRAVENOUS | Status: DC
  Administered 2012-02-12 – 2012-02-25 (×38): 1250 mg via INTRAVENOUS
  Filled 2012-02-12 (×42): qty 1250

## 2012-02-12 MED ORDER — PRO-STAT SUGAR FREE PO LIQD
30.0000 mL | Freq: Four times a day (QID) | ORAL | Status: DC
  Administered 2012-02-12 – 2012-02-15 (×6): 30 mL
  Filled 2012-02-12 (×14): qty 30

## 2012-02-12 NOTE — Progress Notes (Addendum)
ANTIBIOTIC CONSULT NOTE - FOLLOW UP  Pharmacy Consult for vancomycin Indication: MRSA PNA and bacteremia  No Known Allergies  Patient Measurements: Height: 5\' 10"  (177.8 cm) Weight: 190 lb 11.2 oz (86.5 kg) IBW/kg (Calculated) : 73    Vital Signs: Temp: 100.4 F (38 C) (01/21 0800) Temp src: Oral (01/21 0800) BP: 112/49 mmHg (01/21 0847) Pulse Rate: 98  (01/21 0900) Intake/Output from previous day: 01/20 0701 - 01/21 0700 In: 2777.3 [I.V.:2017.3; NG/GT:60; IV Piggyback:700] Out: 2535 [Urine:2310; Emesis/NG output:100; Drains:75; Blood:50] Intake/Output from this shift:    Labs:  Basename 02/12/12 0400 02/11/12 0359 02/10/12 1900  WBC 26.3* 25.6* 25.8*  HGB 9.3* 9.3* 9.7*  PLT 332 219 171  LABCREA -- -- --  CREATININE 0.78 0.89 0.74   Estimated Creatinine Clearance: 114.1 ml/min (by C-G formula based on Cr of 0.78). No results found for this basename: VANCOTROUGH:2,VANCOPEAK:2,VANCORANDOM:2,GENTTROUGH:2,GENTPEAK:2,GENTRANDOM:2,TOBRATROUGH:2,TOBRAPEAK:2,TOBRARND:2,AMIKACINPEAK:2,AMIKACINTROU:2,AMIKACIN:2, in the last 72 hours   Microbiology: Recent Results (from the past 720 hour(s))  TECHNOLOGIST REVIEW     Status: Normal   Collection Time   02/06/12  9:42 AM      Component Value Range Status Comment   Technologist Review Occ Metas and Myelocytes present   Final   CULTURE, BLOOD (ROUTINE X 2)     Status: Normal   Collection Time   02/08/12  3:39 PM      Component Value Range Status Comment   Specimen Description BLOOD RIGHT ANTECUBITAL   Final    Special Requests BOTTLES DRAWN AEROBIC AND ANAEROBIC 5CC   Final    Culture  Setup Time 02/08/2012 23:18   Final    Culture     Final    Value: METHICILLIN RESISTANT STAPHYLOCOCCUS AUREUS     Note: RIFAMPIN AND GENTAMICIN SHOULD NOT BE USED AS SINGLE DRUGS FOR TREATMENT OF STAPH INFECTIONS. This organism DOES NOT demonstrate inducible Clindamycin resistance in vitro. CRITICAL RESULT CALLED TO, READ BACK BY AND VERIFIED  WITH: MANDY ROLLS      02/10/12 @ 9:03PM BY RUSCA.     Note: Gram Stain Report Called to,Read Back By and Verified With: MEELY RICHARDSON 02/09/12 @ 2:26PM BY RUSCA.   Report Status 02/11/2012 FINAL   Final    Organism ID, Bacteria METHICILLIN RESISTANT STAPHYLOCOCCUS AUREUS   Final   CULTURE, BLOOD (ROUTINE X 2)     Status: Normal   Collection Time   02/08/12  3:49 PM      Component Value Range Status Comment   Specimen Description BLOOD LEFT HAND   Final    Special Requests BOTTLES DRAWN AEROBIC ONLY 2CC   Final    Culture  Setup Time 02/08/2012 23:19   Final    Culture     Final    Value: STAPHYLOCOCCUS AUREUS     Note: SUSCEPTIBILITIES PERFORMED ON PREVIOUS CULTURE WITHIN THE LAST 5 DAYS. CRITICAL RESULT CALLED TO, READ BACK BY AND VERIFIED WITH: MANDY ROLLS 02/10/12 @ 9:03PM BY RUSCA.     Note: Gram Stain Report Called to,Read Back By and Verified With: MEELY RICHARDSON 02/09/12 @ 2:26PM BY RUSCA.   Report Status 02/11/2012 FINAL   Final   URINE CULTURE     Status: Normal   Collection Time   02/08/12  4:58 PM      Component Value Range Status Comment   Specimen Description URINE, CATHETERIZED   Final    Special Requests NONE   Final    Culture  Setup Time 02/09/2012 01:48   Final  Colony Count NO GROWTH   Final    Culture NO GROWTH   Final    Report Status 02/10/2012 FINAL   Final   CULTURE, EXPECTORATED SPUTUM-ASSESSMENT     Status: Normal   Collection Time   02/08/12  5:54 PM      Component Value Range Status Comment   Specimen Description SPUTUM   Final    Special Requests NONE   Final    Sputum evaluation     Final    Value: THIS SPECIMEN IS ACCEPTABLE. RESPIRATORY CULTURE REPORT TO FOLLOW.   Report Status 02/08/2012 FINAL   Final   MRSA PCR SCREENING     Status: Abnormal   Collection Time   02/08/12  5:54 PM      Component Value Range Status Comment   MRSA by PCR POSITIVE (*) NEGATIVE Final   CULTURE, RESPIRATORY     Status: Normal   Collection Time   02/08/12  5:54 PM        Component Value Range Status Comment   Specimen Description SPUTUM   Final    Special Requests NONE   Final    Gram Stain     Final    Value: ABUNDANT WBC PRESENT,BOTH PMN AND MONONUCLEAR     RARE SQUAMOUS EPITHELIAL CELLS PRESENT     ABUNDANT GRAM POSITIVE COCCI     IN PAIRS IN CLUSTERS   Culture     Final    Value: ABUNDANT METHICILLIN RESISTANT STAPHYLOCOCCUS AUREUS     Note: RIFAMPIN AND GENTAMICIN SHOULD NOT BE USED AS SINGLE DRUGS FOR TREATMENT OF STAPH INFECTIONS. This organism DOES NOT demonstrate inducible Clindamycin resistance in vitro.   Report Status 02/12/2012 FINAL   Final    Organism ID, Bacteria METHICILLIN RESISTANT STAPHYLOCOCCUS AUREUS   Final   CULTURE, BLOOD (ROUTINE X 2)     Status: Normal (Preliminary result)   Collection Time   02/09/12  7:50 PM      Component Value Range Status Comment   Specimen Description Blood   Final    Special Requests NONE   Final    Culture  Setup Time 02/10/2012 02:13   Final    Culture     Final    Value:        BLOOD CULTURE RECEIVED NO GROWTH TO DATE CULTURE WILL BE HELD FOR 5 DAYS BEFORE ISSUING A FINAL NEGATIVE REPORT   Report Status PENDING   Incomplete   CULTURE, BLOOD (ROUTINE X 2)     Status: Normal (Preliminary result)   Collection Time   02/09/12  7:55 PM      Component Value Range Status Comment   Specimen Description Blood   Final    Special Requests NONE   Final    Culture  Setup Time 02/10/2012 02:13   Final    Culture     Final    Value:        BLOOD CULTURE RECEIVED NO GROWTH TO DATE CULTURE WILL BE HELD FOR 5 DAYS BEFORE ISSUING A FINAL NEGATIVE REPORT   Report Status PENDING   Incomplete   ANAEROBIC CULTURE     Status: Normal (Preliminary result)   Collection Time   02/10/12  4:32 PM      Component Value Range Status Comment   Specimen Description ABSCESS LEFT CHEST   Final    Special Requests PATIENT ON FOLLOWING ANCEF VANC   Final    Gram Stain PENDING   Incomplete  Culture     Final    Value: NO  ANAEROBES ISOLATED; CULTURE IN PROGRESS FOR 5 DAYS   Report Status PENDING   Incomplete   GRAM STAIN     Status: Normal   Collection Time   02/10/12  4:32 PM      Component Value Range Status Comment   Specimen Description ABSCESS LEFT CHEST   Final    Special Requests PATIENT ON FOLLOWING ANCEF VANC   Final    Gram Stain     Final    Value: ABUNDANT WBC PRESENT, PREDOMINANTLY PMN     ABUNDANT GRAM POSITIVE COCCI IN CLUSTERS     Gram Stain Report Called to,Read Back By and Verified With: P.WEATHERLY,RN 02/10/12 1725 EHOWARD   Report Status 02/10/2012 FINAL   Final   CULTURE, ROUTINE-ABSCESS     Status: Normal (Preliminary result)   Collection Time   02/10/12  4:34 PM      Component Value Range Status Comment   Specimen Description ABSCESS LEFT CHEST   Final    Special Requests PATIENT ON FOLLOWING VANC ANCEF   Final    Gram Stain     Final    Value: ABUNDANT WBC PRESENT, PREDOMINANTLY PMN     NO SQUAMOUS EPITHELIAL CELLS SEEN     ABUNDANT GRAM POSITIVE COCCI IN CLUSTERS     Gram Stain Report Called to,Read Back By and Verified With: Gram Stain Report Called to,Read Back By and Verified With: P WEATHERLY RN 02/10/12 1725 BY [id number] Performed at [facility]   Culture     Final    Value: ABUNDANT STAPHYLOCOCCUS AUREUS     Note: RIFAMPIN AND GENTAMICIN SHOULD NOT BE USED AS SINGLE DRUGS FOR TREATMENT OF STAPH INFECTIONS.   Report Status PENDING   Incomplete     Anti-infectives     Start     Dose/Rate Route Frequency Ordered Stop   02/11/12 1100   vancomycin (VANCOCIN) IVPB 1000 mg/200 mL premix        1,000 mg 200 mL/hr over 60 Minutes Intravenous Every 8 hours 02/11/12 1042     02/11/12 0045   vancomycin (VANCOCIN) IVPB 1000 mg/200 mL premix  Status:  Discontinued     Comments: CONTINUE TILL FURTHER NOTICE      1,000 mg 200 mL/hr over 60 Minutes Intravenous Every 12 hours 02/10/12 1803 02/11/12 1041   02/10/12 2200   ceFAZolin (ANCEF) IVPB 2 g/50 mL premix  Status:   Discontinued        2 g 100 mL/hr over 30 Minutes Intravenous 3 times per day 02/10/12 1610 02/10/12 1755   02/10/12 2200   piperacillin-tazobactam (ZOSYN) IVPB 3.375 g  Status:  Discontinued        3.375 g 12.5 mL/hr over 240 Minutes Intravenous 3 times per day 02/10/12 1834 02/11/12 1130   02/10/12 1400   vancomycin (VANCOCIN) IVPB 1000 mg/200 mL premix  Status:  Discontinued        1,000 mg 200 mL/hr over 60 Minutes Intravenous Every 8 hours 02/10/12 1111 02/10/12 1755   02/10/12 1200   cefTRIAXone (ROCEPHIN) 1 g in dextrose 5 % 50 mL IVPB  Status:  Discontinued        1 g 100 mL/hr over 30 Minutes Intravenous Every 12 hours 02/10/12 1108 02/10/12 1610   02/09/12 1800   vancomycin (VANCOCIN) IVPB 1000 mg/200 mL premix  Status:  Discontinued        1,000 mg 200 mL/hr over 60  Minutes Intravenous Every 12 hours 02/09/12 1318 02/10/12 1111   02/09/12 0600   vancomycin (VANCOCIN) 1,250 mg in sodium chloride 0.9 % 250 mL IVPB  Status:  Discontinued        1,250 mg 166.7 mL/hr over 90 Minutes Intravenous Every 24 hours 02/08/12 1720 02/09/12 1318   02/08/12 2200   piperacillin-tazobactam (ZOSYN) IVPB 3.375 g  Status:  Discontinued        3.375 g 12.5 mL/hr over 240 Minutes Intravenous 3 times per day 02/08/12 1649 02/10/12 1108   02/08/12 2000   oseltamivir (TAMIFLU) capsule 75 mg  Status:  Discontinued        75 mg Oral 2 times daily 02/08/12 1720 02/10/12 1108   02/08/12 1700   vancomycin (VANCOCIN) IVPB 1000 mg/200 mL premix  Status:  Discontinued        1,000 mg 200 mL/hr over 60 Minutes Intravenous Every 12 hours 02/08/12 1649 02/08/12 1720   02/08/12 1530   vancomycin (VANCOCIN) IVPB 1000 mg/200 mL premix        1,000 mg 200 mL/hr over 60 Minutes Intravenous  Once 02/08/12 1527 02/08/12 2000   02/08/12 1530  piperacillin-tazobactam (ZOSYN) IVPB 3.375 g       3.375 g 12.5 mL/hr over 240 Minutes Intravenous  Once 02/08/12 1527 02/08/12 2004           Assessment: Patient is a  52 y.o M with respiratory and blood cultures positive for MRSA.  Patient remains febrile with wbc elevated at 26.3.  Goal of Therapy:  Vancomycin trough level 15-20 mcg/ml  Plan:  1) obtain vancomycin trough level today with 11 AM dose to ensure current dose is appropriate. 2) with MRSA bacteremia, consider obtaining TEE to r/o endocarditis  Lorel Lembo P 02/12/2012,9:07 AM  __________________ 1:30 PM Adden: vancomycin trough level now back slightly subtherapeutic at 13.5.  Will increase vancomycin dose to 1250mg  IV q8h.

## 2012-02-12 NOTE — Progress Notes (Signed)
NUTRITION CONSULT/FOLLOW UP  Intervention:    Initiate Oxepa formula at 15 ml/hr and increase by 10 ml every 4 hours to goal rate of 45 ml/hr with Prostat liquid protein 30 ml 4 times daily via tube to provide 2020 total kcals, 128 gm protein, 848 ml of free water ---> rest of kcals will be met with Propofol infusion RD to follow for nutrition care plan  Nutrition Dx:   Inadequate oral intake related to inability to eat as evidenced by NPO status, ongoing  Goal:   EN to meet >/= 90% of estimated nutrition needs  Monitor:   EN regimen & tolerance, Propofol infusion, respiratory status, weight, labs, I/O's  Assessment:   Patient is currently intubated on ventilator support MV: 12.2 Temp: 38 Propofol: 10.5 ml/hr ---> 277 fat kcals  Patient s/p procedures 1/19: IRRIGATION AND DEBRIDEMENT ABSCESS (Left)  MINI/LIMITED THORACOTOMY (Left)  S/p chest re-exploration and wound VAC change 1/20.  OGT in place.  RD consulted for EN initiation & management.  Infectious Disease note reviewed ---> patient will need long-term ABX therapy.  Height: Ht Readings from Last 1 Encounters:  02/10/12 5\' 10"  (1.778 m)    Weight Status:   Wt Readings from Last 1 Encounters:  02/12/12 190 lb 11.2 oz (86.5 kg)    Re-estimated needs:  Kcal: 2200-2300 Protein: 130-140 gm Fluid: 2.2-2.3 L  Skin: chest wound  Diet Order: NPO   Intake/Output Summary (Last 24 hours) at 02/12/12 1358 Last data filed at 02/12/12 1158  Gross per 24 hour  Intake 1916.5 ml  Output   2090 ml  Net -173.5 ml    Labs:   Lab 02/12/12 0400 02/11/12 0359 02/10/12 1900 02/09/12 0335  NA 138 132* 132* --  K 3.8 3.9 4.0 --  CL 104 100 100 --  CO2 24 24 25  --  BUN 21 29* 26* --  CREATININE 0.78 0.89 0.74 --  CALCIUM 8.2* 7.7* 7.6* --  MG 2.1 -- -- 2.5  PHOS 2.9 -- -- 4.1  GLUCOSE 116* 121* 135* --    CBG (last 3)   Basename 02/10/12 1231  GLUCAP 108*    Scheduled Meds:   . sodium chloride   Intravenous  Once  . ipratropium  0.5 mg Nebulization Q6H   And  . albuterol  2.5 mg Nebulization Q6H  . antiseptic oral rinse  1 application Mouth Rinse QID  . bisacodyl  10 mg Oral Daily  . chlorhexidine  15 mL Mouth Rinse BID  . Chlorhexidine Gluconate Cloth  6 each Topical Q0600  . folic acid  1 mg Intravenous Daily  . mupirocin ointment  1 application Nasal BID  . pantoprazole (PROTONIX) IV  40 mg Intravenous Q24H  . sodium chloride  10-40 mL Intracatheter Q12H  . thiamine  100 mg Intravenous Daily  . vancomycin  1,250 mg Intravenous Q8H    Continuous Infusions:   . fentaNYL infusion INTRAVENOUS 50 mcg/hr (02/12/12 1300)  . lactated ringers 20 mL/hr at 02/12/12 1200  . propofol 20 mcg/kg/min (02/12/12 0953)    Maureen Chatters, RD, LDN Pager #: [phone] After-Hours Pager #: [phone]

## 2012-02-12 NOTE — Progress Notes (Signed)
PCCM Progress Note  Pt profile: 77M with recently discovered LUL/chest wall mass, admitted 1/17 with severe chest wall pain and hypoxemia, suspected PNA.  Further history obtained 1/19: Pt developed  Small lesion on middle finger of L hand around 1/03. Developed severe upper L chest pain 1/08 and seen by primary MD 1/09. CXR normal @ that time. 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 Seen by Onc 1/15 and plans for eval of presumed lung cancer. Admitted via ED 1/17 with severe chest pain, hypoxemia, presumed PNA and continued presumption of LUL malignancy. Blood cultures drawn, abx started. Blood cx's turned positive on 1/18.   Studies/Events: 1/13 CT chest: Left upper lobe soft tissue mass anteriorly abutting the pleura  of 7.3 x 4.4 x 5.0 cm. There does appear to be surrounding infiltrate. This is worrisome for primary lung carcinoma. A portion of this soft tissue mass extends through the left anterior chest wall and involves the left pectoralis musculature. Multiple bilateral nodules worrisome for mets 1/19 concern for chest wall abscess rather than malignancy. Transferred to [facility] for TCTS eval 1/19 Irrigation and debridement L chest wall abscess, mini-thoracotomy, placement of VAC and L chest tube Tyrone Sage) 1/20 TTE >> normal LV fxn but grade 2 diastolic dysfxn, mild L atrial dilation, poor view of AV, no evidence MR  Lines, Tubes, etc: ETT 1/19 >>  L IJ CVL 1/19 >>  R radial A-line 1/19 >>   Microbiology: MRSA PCR 1/17 >> POSITIVE Resp 1/17 >> MRSA Urine 1/17 >> negative Blood 1/17 >> 2/2  MRSA  Blood 1/18 >>  Abscess 1/19 >> S aureus >>   Antibiotics:  Osletamivir 1/17 >> 1/19 Zosyn 1/17 >> 1/20 Vanc 1/17 >>   Consults:  TCTS ID  Best Practice: DVT: LMWH 1/18 SUP: N/I Nutrition: Reg diet Glycemic control: N/I  Sedation/analgesia: propofol, fentanyl  Subj: Agitated last night 1/21,  propofol added to existing versed  Obj: Filed Vitals:   02/12/12 0900  BP:   Pulse: 98  Temp:   Resp: 24    Intake/Output Summary (Last 24 hours) at 02/12/12 1047 Last data filed at 02/12/12 0900  Gross per 24 hour  Intake   2145 ml  Output   2465 ml  Net   -320 ml   Gen: chronically ill appearing HEENT: WNL Neck: Fullness in L side of neck and supraclav region. No discrete nodes Chest: L upper chest wound,  Diffuse scattered rhonchi Cardiac: tachy, reg, no M Abd: soft, NT, NABS Ext: warm, no edema, erythematous L middle finger with mild L forearm erythema  BMET  Lab 02/12/12 0400 02/11/12 0359 02/10/12 1900 02/10/12 0358 02/09/12 0335  NA 138 132* 132* 131* 127*  K 3.8 3.9 -- -- --  CL 104 100 100 96 91*  CO2 24 24 25 26 24   GLUCOSE 116* 121* 135* 130* 116*  BUN 21 29* 26* 29* 42*  CREATININE 0.78 0.89 0.74 0.90 1.44*  CALCIUM 8.2* 7.7* 7.6* 8.6 8.7  MG 2.1 -- -- -- 2.5  PHOS 2.9 -- -- -- 4.1    CBC  Lab 02/12/12 0400 02/11/12 0359 02/10/12 1900  HGB 9.3* 9.3* 9.7*  HCT 28.4* 26.9* 28.6*  WBC 26.3* 25.6* 25.8*  PLT 332 219 171    Lab 02/11/12 0359 02/09/12 0335  PROCALCITON 2.81 9.36    CXR: 1/21 LUL opacity resolving, stable B AS dz, L chest tube  IMPRESSION: Patient Active  Problem List  Diagnosis  . H/O HTN  . H/O hypercholesteremia  . H/O stroke  . Acute respiratory failure  . Hypoxemia  . Pain, chest wall  . Chest wall abscess  . Lung abscess  . Staphylococcus aureus bacteremia with sepsis  . Status post thoracotomy, 1/19 Tyrone Sage   PLAN/RECS:  Follow CXR - resolution of L upper opacity and findings at surgery suggest abscess was in pleural space, not the LUL. Continues to drain adequately. Plan is for further debridement/dressing changes at bedside.  Vanco per ID recs Blood cx from 1/18 (on abx) pending. If positive then will change lines. NO PICC placement until we confirm cx's have gone negative ? Timing for repeat imaging, CT scan.  Will discuss with Dr Tyrone Sage, depends on clinical course and CXR appearance Will arrange for TEE to better eval for possible endocarditis as this will affect duration of abx therapy Begin WUA's and SBT's Propofol for sedation, d/c versed Note he will likely be at risk for EtOH withdrawal when we move to extubation; thiamine, folate Acute renal failure resolved with volume resuscitation - follow BMP  40 minutes CC time   Levy Pupa, MD, PhD 02/12/2012, 10:40 AM [facility] [phone] or if no answer [phone]

## 2012-02-12 NOTE — Progress Notes (Signed)
Patient ID: Frank Brock, male   DOB: 1960-01-31, 52 y.o.   MRN: [id number] T[facility] DAILY PROGRESS NOTE                   [address]            [place] [address]          [phone]      1 Day Post-Op Procedure(s) (LRB): CHEST EXPLORATION (Left) MINOR APPLICATION OF WOUND VAC (Left)  Total Length of Stay:  LOS: 4 days   Subjective: Sedated on vent  Objective: Vital signs in last 24 hours: Temp:  [97.3 F (36.3 C)-101.6 F (38.7 C)] 101.1 F (38.4 C) (01/21 0400) Pulse Rate:  [78-120] 98  (01/21 0700) Cardiac Rhythm:  [-] Normal sinus rhythm (01/20 2000) Resp:  [17-32] 24  (01/21 0700) BP: (97-174)/(50-95) 111/50 mmHg (01/21 0700) SpO2:  [94 %-100 %] 94 % (01/21 0700) Arterial Line BP: (104-214)/(46-97) 104/46 mmHg (01/21 0700) FiO2 (%):  [40 %-100 %] 40 % (01/21 0500) Weight:  [190 lb 11.2 oz (86.5 kg)] 190 lb 11.2 oz (86.5 kg) (01/21 0300)  Filed Weights   02/10/12 0500 02/10/12 2030 02/12/12 0300  Weight: 179 lb 7.3 oz (81.4 kg) 193 lb 2 oz (87.6 kg) 190 lb 11.2 oz (86.5 kg)    Weight change: -2 lb 6.8 oz (-1.1 kg)   Hemodynamic parameters for last 24 hours:    Intake/Output from previous day: 01/20 0701 - 01/21 0700 In: 2777.3 [I.V.:2017.3; NG/GT:60; IV Piggyback:700] Out: 2535 [Urine:2310; Emesis/NG output:100; Drains:75; Blood:50]  Intake/Output this shift:    Current Meds: Scheduled Meds:   . sodium chloride   Intravenous Once  . ipratropium  0.5 mg Nebulization Q6H   And  . albuterol  2.5 mg Nebulization Q6H  . antiseptic oral rinse  1 application Mouth Rinse QID  . bisacodyl  10 mg Oral Daily  . chlorhexidine  15 mL Mouth Rinse BID  . Chlorhexidine Gluconate Cloth  6 each Topical Q0600  . mupirocin ointment  1 application Nasal BID  . pantoprazole (PROTONIX) IV  40 mg Intravenous Q24H  . sodium chloride  10-40 mL Intracatheter Q12H  . thiamine  100 mg Intravenous Daily  . vancomycin  1,000 mg Intravenous Q8H   Continuous  Infusions:   . fentaNYL infusion INTRAVENOUS 120 mcg/hr (02/12/12 0700)  . lactated ringers 20 mL/hr at 02/12/12 0700  . midazolam (VERSED) infusion 4 mg/hr (02/12/12 0700)  . norepinephrine (LEVOPHED) Adult infusion Stopped (02/11/12 1000)  . propofol 20 mcg/kg/min (02/12/12 0700)   PRN Meds:.acetaminophen (TYLENOL) oral liquid 160 mg/5 mL, albuterol, fentaNYL, hydrALAZINE, metoprolol, midazolam, ondansetron (ZOFRAN) IV, potassium chloride, sodium chloride  General appearance: mild distress and sedated  Neurologic: intact Heart: regular rate and rhythm, S1, S2 normal, no murmur, click, rub or gallop and normal apical impulse Lungs: diminished breath sounds bibasilar Abdomen: soft, non-tender; bowel sounds normal; no masses,  no organomegaly Extremities: some edema left arm, no erythemia  Wound: vac functioning well   Lab Results: CBC: Basename 02/12/12 0400 02/11/12 0359  WBC 26.3* 25.6*  HGB 9.3* 9.3*  HCT 28.4* 26.9*  PLT 332 219   BMET:  Basename 02/12/12 0400 02/11/12 0359  NA 138 132*  K 3.8 3.9  CL 104 100  CO2 24 24  GLUCOSE 116* 121*  BUN 21 29*  CREATININE 0.78 0.89  CALCIUM 8.2* 7.7*    PT/INR:  Basename 02/10/12 1900  LABPROT 16.7*  INR 1.39  Radiology: Ct Chest Wo Contrast  02/10/2012  *RADIOLOGY REPORT*  Clinical Data: Chest wall abscess.  CT CHEST WITHOUT CONTRAST  Technique:  Multidetector CT imaging of the chest was performed following the standard protocol without IV contrast.  Comparison: 02/04/2012.  Findings: There is a large left-sided chest wall abscess containing fluid and air.  This is in the subpectoral space.  This is contiguous with a large intrathoracic/lung abscess.  This is likely an aggressive infection.  No destructive bony changes to suggest osteomyelitis.  Since the prior study there is been a rapidly progressive multifocal infectious process in the lungs with multi focal infiltrates and probable small/early pulmonary abscesses.  Septic  emboli is a strong possibility.  There are also bilateral pleural effusions, right greater than left.  The upper abdomen is stable.  IMPRESSION:  1.  Large left-sided chest wall abscess communicates with the adjacent intrathoracic/lung abscess. 2.  Interval development of rapidly progressive bilateral pulmonary infections.  This appears be a combination of infiltrate and septic emboli. 3.  Enlarged mediastinal and hilar lymph nodes.   Original Report Authenticated By: Rudie Meyer, M.D.    Dg Chest Port 1 View  02/11/2012  *RADIOLOGY REPORT*  Clinical Data: Endotracheal tube placement.  PORTABLE CHEST - 1 VIEW  Comparison: 02/11/2012 at 0631 hours.  Findings: Endotracheal tube is in satisfactory position.  Left IJ central line tip projects over the SVC.  Left chest tube is in place.  Nasogastric tube is followed into the stomach.  Heart size normal. Biapical pleural thickening.  Patchy bilateral air space disease persists.  No pneumothorax.  No definite pleural fluid.  IMPRESSION:  1.  Endotracheal tube is in satisfactory position. 2.  Patchy bilateral air space disease. 3.  Left chest tube in place.  No definite pneumothorax.   Original Report Authenticated By: Leanna Battles, M.D.    Dg Chest Port 1 View  02/11/2012  *RADIOLOGY REPORT*  Clinical Data: Postop wound exploration with irrigation and debridement of abscess  PORTABLE CHEST - 1 VIEW  Comparison: Portable chest x-ray of 02/10/2012  Findings: The tip of the endotracheal tube is 7.8 cm above the carina.  A left IJ central venous line tip is unchanged overlying the upper SVC.  A left chest tube is present and no left pneumothorax is noted.  Patchy airspace disease remains bilaterally.  Mild cardiomegaly is stable.  IMPRESSION: Stable chest x-ray with patchy airspace disease.  Endotracheal tube, left IJ central venous line, and left chest tube are unchanged.   Original Report Authenticated By: Dwyane Dee, M.D.    Dg Chest Port 1 View  02/10/2012   *RADIOLOGY REPORT*  Clinical Data: Central line placement.  PORTABLE CHEST - 1 VIEW  Comparison: Chest radiograph performed earlier today at 07:25 p.m.  Findings: The patient's left IJ line is seen ending about the proximal SVC.  An enteric tube is noted extending below the diaphragm.  The endotracheal tube is seen ending 5 cm above the carina.  There is persistent bilateral airspace opacification, favoring the right lung base and left lung apex.  As noted on recent CT, left apical airspace opacification reflects an intrathoracic abscess.  A left basilar chest tube is unchanged in position.  No pleural effusion or pneumothorax is seen.  The cardiomediastinal silhouette is normal in size.  No acute osseous abnormalities are identified.  Chronic right-sided rib deformities are again seen.  IMPRESSION:  1.  Left IJ line seen ending about the proximal SVC. 2.  Persistent bilateral airspace  opacification again noted; as noted on recent CT, left apical airspace opacification reflects an intrathoracic abscess.   Original Report Authenticated By: Tonia Ghent, M.D.    Dg Chest Port 1 View  02/10/2012  *RADIOLOGY REPORT*  Clinical Data: Decompression of chest wall abscess.  PORTABLE CHEST - 1 VIEW  Comparison: 02/10/2012.  Findings: The endotracheal tube is in good position, 6 cm above the carina.  The NG tube is in the stomach.  A left-sided chest tube is present.  No definite left-sided pneumothorax.  The lungs show slight improved aeration.  There are persistent multifocal infiltrates.  IMPRESSION:  1.  Postoperative support apparatus in good position. 2.  No pneumothorax. 3.  Improved lung aeration but persistent bilateral infiltrates.   Original Report Authenticated By: Rudie Meyer, M.D.      Assessment/Plan: S/P Procedure(s) (LRB): CHEST EXPLORATION (Left) MINOR APPLICATION OF WOUND VAC (Left) Avoid bp cuff and iv in left arm Will need nutrition soon change vac  In 1-2 days  Still marked leucocytosis  but decreasing Intrathoracic "mass" at left upper lung zone improved on portable chest xray   Aleya Durnell B 02/12/2012 8:14 AM

## 2012-02-12 NOTE — Progress Notes (Signed)
INFECTIOUS DISEASE PROGRESS NOTE  ID: Frank Brock is a 52 y.o. male with  Principal Problem:  *Staphylococcus aureus bacteremia with sepsis Active Problems:  H/O hypercholesteremia  H/O stroke  Acute respiratory failure  Hypoxemia  Pain, chest wall  Chest wall abscess  Lung abscess  Status post thoracotomy, 1/19 - [facility]  Subjective: On vent  Abtx:  Anti-infectives     Start     Dose/Rate Route Frequency Ordered Stop   02/11/12 1100   vancomycin (VANCOCIN) IVPB 1000 mg/200 mL premix        1,000 mg 200 mL/hr over 60 Minutes Intravenous Every 8 hours 02/11/12 1042     02/11/12 0045   vancomycin (VANCOCIN) IVPB 1000 mg/200 mL premix  Status:  Discontinued     Comments: CONTINUE TILL FURTHER NOTICE      1,000 mg 200 mL/hr over 60 Minutes Intravenous Every 12 hours 02/10/12 1803 02/11/12 1041   02/10/12 2200   ceFAZolin (ANCEF) IVPB 2 g/50 mL premix  Status:  Discontinued        2 g 100 mL/hr over 30 Minutes Intravenous 3 times per day 02/10/12 1610 02/10/12 1755   02/10/12 2200   piperacillin-tazobactam (ZOSYN) IVPB 3.375 g  Status:  Discontinued        3.375 g 12.5 mL/hr over 240 Minutes Intravenous 3 times per day 02/10/12 1834 02/11/12 1130   02/10/12 1400   vancomycin (VANCOCIN) IVPB 1000 mg/200 mL premix  Status:  Discontinued        1,000 mg 200 mL/hr over 60 Minutes Intravenous Every 8 hours 02/10/12 1111 02/10/12 1755   02/10/12 1200   cefTRIAXone (ROCEPHIN) 1 g in dextrose 5 % 50 mL IVPB  Status:  Discontinued        1 g 100 mL/hr over 30 Minutes Intravenous Every 12 hours 02/10/12 1108 02/10/12 1610   02/09/12 1800   vancomycin (VANCOCIN) IVPB 1000 mg/200 mL premix  Status:  Discontinued        1,000 mg 200 mL/hr over 60 Minutes Intravenous Every 12 hours 02/09/12 1318 02/10/12 1111   02/09/12 0600   vancomycin (VANCOCIN) 1,250 mg in sodium chloride 0.9 % 250 mL IVPB  Status:  Discontinued        1,250 mg 166.7 mL/hr over 90 Minutes Intravenous  Every 24 hours 02/08/12 1720 02/09/12 1318   02/08/12 2200   piperacillin-tazobactam (ZOSYN) IVPB 3.375 g  Status:  Discontinued        3.375 g 12.5 mL/hr over 240 Minutes Intravenous 3 times per day 02/08/12 1649 02/10/12 1108   02/08/12 2000   oseltamivir (TAMIFLU) capsule 75 mg  Status:  Discontinued        75 mg Oral 2 times daily 02/08/12 1720 02/10/12 1108   02/08/12 1700   vancomycin (VANCOCIN) IVPB 1000 mg/200 mL premix  Status:  Discontinued        1,000 mg 200 mL/hr over 60 Minutes Intravenous Every 12 hours 02/08/12 1649 02/08/12 1720   02/08/12 1530   vancomycin (VANCOCIN) IVPB 1000 mg/200 mL premix        1,000 mg 200 mL/hr over 60 Minutes Intravenous  Once 02/08/12 1527 02/08/12 2000   02/08/12 1530  piperacillin-tazobactam (ZOSYN) IVPB 3.375 g       3.375 g 12.5 mL/hr over 240 Minutes Intravenous  Once 02/08/12 1527 02/08/12 2004          Medications:  Scheduled:   . sodium chloride   Intravenous Once  .  ipratropium  0.5 mg Nebulization Q6H   And  . albuterol  2.5 mg Nebulization Q6H  . antiseptic oral rinse  1 application Mouth Rinse QID  . bisacodyl  10 mg Oral Daily  . chlorhexidine  15 mL Mouth Rinse BID  . Chlorhexidine Gluconate Cloth  6 each Topical Q0600  . folic acid  1 mg Intravenous Daily  . mupirocin ointment  1 application Nasal BID  . pantoprazole (PROTONIX) IV  40 mg Intravenous Q24H  . sodium chloride  10-40 mL Intracatheter Q12H  . thiamine  100 mg Intravenous Daily  . vancomycin  1,000 mg Intravenous Q8H    Objective: Vital signs in last 24 hours: Temp:  [98 F (36.7 C)-101.6 F (38.7 C)] 100.4 F (38 C) (01/21 0800) Pulse Rate:  [81-120] 98  (01/21 0900) Resp:  [19-32] 24  (01/21 0900) BP: (107-174)/(49-95) 112/49 mmHg (01/21 0847) SpO2:  [94 %-100 %] 96 % (01/21 0900) Arterial Line BP: (102-214)/(45-97) 105/45 mmHg (01/21 0900) FiO2 (%):  [40 %-50 %] 40 % (01/21 0847) Weight:  [86.5 kg (190 lb 11.2 oz)] 86.5 kg (190 lb 11.2  oz) (01/21 0300)   General appearance: no distress and on vent Neck: L IJ Resp: rhonchi anterior - R > L Chest wall: midline vac Cardio: tachycardic, no murmur appreciated GI: normal findings: soft, non-tender and abnormal findings:  hypoactive bowel sounds Extremities: edema 2+  Lab Results  Basename 02/12/12 0400 02/11/12 0359  WBC 26.3* 25.6*  HGB 9.3* 9.3*  HCT 28.4* 26.9*  NA 138 132*  K 3.8 3.9  CL 104 100  CO2 24 24  BUN 21 29*  CREATININE 0.78 0.89  GLU -- --   Liver Panel  Basename 02/12/12 0400 02/10/12 1900  PROT 5.8* 5.4*  ALBUMIN 1.6* 1.8*  AST 97* 103*  ALT 68* 53  ALKPHOS 165* 174*  BILITOT 1.0 2.1*  BILIDIR -- --  IBILI -- --   Sedimentation Rate  Basename 02/12/12 0400  ESRSEDRATE 117*   C-Reactive Protein  Basename 02/12/12 0400  CRP 15.2*    Microbiology: Recent Results (from the past 240 hour(s))  TECHNOLOGIST REVIEW     Status: Normal   Collection Time   02/06/12  9:42 AM      Component Value Range Status Comment   Technologist Review Occ Metas and Myelocytes present   Final   CULTURE, BLOOD (ROUTINE X 2)     Status: Normal   Collection Time   02/08/12  3:39 PM      Component Value Range Status Comment   Specimen Description BLOOD RIGHT ANTECUBITAL   Final    Special Requests BOTTLES DRAWN AEROBIC AND ANAEROBIC 5CC   Final    Culture  Setup Time 02/08/2012 23:18   Final    Culture     Final    Value: METHICILLIN RESISTANT STAPHYLOCOCCUS AUREUS     Note: RIFAMPIN AND GENTAMICIN SHOULD NOT BE USED AS SINGLE DRUGS FOR TREATMENT OF STAPH INFECTIONS. This organism DOES NOT demonstrate inducible Clindamycin resistance in vitro. CRITICAL RESULT CALLED TO, READ BACK BY AND VERIFIED WITH: MANDY ROLLS      02/10/12 @ 9:03PM BY RUSCA.     Note: Gram Stain Report Called to,Read Back By and Verified With: MEELY RICHARDSON 02/09/12 @ 2:26PM BY RUSCA.   Report Status 02/11/2012 FINAL   Final    Organism ID, Bacteria METHICILLIN RESISTANT  STAPHYLOCOCCUS AUREUS   Final   CULTURE, BLOOD (ROUTINE X 2)  Status: Normal   Collection Time   02/08/12  3:49 PM      Component Value Range Status Comment   Specimen Description BLOOD LEFT HAND   Final    Special Requests BOTTLES DRAWN AEROBIC ONLY 2CC   Final    Culture  Setup Time 02/08/2012 23:19   Final    Culture     Final    Value: STAPHYLOCOCCUS AUREUS     Note: SUSCEPTIBILITIES PERFORMED ON PREVIOUS CULTURE WITHIN THE LAST 5 DAYS. CRITICAL RESULT CALLED TO, READ BACK BY AND VERIFIED WITH: MANDY ROLLS 02/10/12 @ 9:03PM BY RUSCA.     Note: Gram Stain Report Called to,Read Back By and Verified With: MEELY RICHARDSON 02/09/12 @ 2:26PM BY RUSCA.   Report Status 02/11/2012 FINAL   Final   URINE CULTURE     Status: Normal   Collection Time   02/08/12  4:58 PM      Component Value Range Status Comment   Specimen Description URINE, CATHETERIZED   Final    Special Requests NONE   Final    Culture  Setup Time 02/09/2012 01:48   Final    Colony Count NO GROWTH   Final    Culture NO GROWTH   Final    Report Status 02/10/2012 FINAL   Final   CULTURE, EXPECTORATED SPUTUM-ASSESSMENT     Status: Normal   Collection Time   02/08/12  5:54 PM      Component Value Range Status Comment   Specimen Description SPUTUM   Final    Special Requests NONE   Final    Sputum evaluation     Final    Value: THIS SPECIMEN IS ACCEPTABLE. RESPIRATORY CULTURE REPORT TO FOLLOW.   Report Status 02/08/2012 FINAL   Final   MRSA PCR SCREENING     Status: Abnormal   Collection Time   02/08/12  5:54 PM      Component Value Range Status Comment   MRSA by PCR POSITIVE (*) NEGATIVE Final   CULTURE, RESPIRATORY     Status: Normal   Collection Time   02/08/12  5:54 PM      Component Value Range Status Comment   Specimen Description SPUTUM   Final    Special Requests NONE   Final    Gram Stain     Final    Value: ABUNDANT WBC PRESENT,BOTH PMN AND MONONUCLEAR     RARE SQUAMOUS EPITHELIAL CELLS PRESENT     ABUNDANT  GRAM POSITIVE COCCI     IN PAIRS IN CLUSTERS   Culture     Final    Value: ABUNDANT METHICILLIN RESISTANT STAPHYLOCOCCUS AUREUS     Note: RIFAMPIN AND GENTAMICIN SHOULD NOT BE USED AS SINGLE DRUGS FOR TREATMENT OF STAPH INFECTIONS. This organism DOES NOT demonstrate inducible Clindamycin resistance in vitro.   Report Status 02/12/2012 FINAL   Final    Organism ID, Bacteria METHICILLIN RESISTANT STAPHYLOCOCCUS AUREUS   Final   CULTURE, BLOOD (ROUTINE X 2)     Status: Normal (Preliminary result)   Collection Time   02/09/12  7:50 PM      Component Value Range Status Comment   Specimen Description Blood   Final    Special Requests NONE   Final    Culture  Setup Time 02/10/2012 02:13   Final    Culture     Final    Value:        BLOOD CULTURE RECEIVED NO GROWTH TO DATE CULTURE WILL BE  HELD FOR 5 DAYS BEFORE ISSUING A FINAL NEGATIVE REPORT   Report Status PENDING   Incomplete   CULTURE, BLOOD (ROUTINE X 2)     Status: Normal (Preliminary result)   Collection Time   02/09/12  7:55 PM      Component Value Range Status Comment   Specimen Description Blood   Final    Special Requests NONE   Final    Culture  Setup Time 02/10/2012 02:13   Final    Culture     Final    Value:        BLOOD CULTURE RECEIVED NO GROWTH TO DATE CULTURE WILL BE HELD FOR 5 DAYS BEFORE ISSUING A FINAL NEGATIVE REPORT   Report Status PENDING   Incomplete   ANAEROBIC CULTURE     Status: Normal (Preliminary result)   Collection Time   02/10/12  4:32 PM      Component Value Range Status Comment   Specimen Description ABSCESS LEFT CHEST   Final    Special Requests PATIENT ON FOLLOWING ANCEF VANC   Final    Gram Stain PENDING   Incomplete    Culture     Final    Value: NO ANAEROBES ISOLATED; CULTURE IN PROGRESS FOR 5 DAYS   Report Status PENDING   Incomplete   GRAM STAIN     Status: Normal   Collection Time   02/10/12  4:32 PM      Component Value Range Status Comment   Specimen Description ABSCESS LEFT CHEST   Final     Special Requests PATIENT ON FOLLOWING ANCEF VANC   Final    Gram Stain     Final    Value: ABUNDANT WBC PRESENT, PREDOMINANTLY PMN     ABUNDANT GRAM POSITIVE COCCI IN CLUSTERS     Gram Stain Report Called to,Read Back By and Verified With: P.WEATHERLY,RN 02/10/12 1725 EHOWARD   Report Status 02/10/2012 FINAL   Final   CULTURE, ROUTINE-ABSCESS     Status: Normal (Preliminary result)   Collection Time   02/10/12  4:34 PM      Component Value Range Status Comment   Specimen Description ABSCESS LEFT CHEST   Final    Special Requests PATIENT ON FOLLOWING VANC ANCEF   Final    Gram Stain     Final    Value: ABUNDANT WBC PRESENT, PREDOMINANTLY PMN     NO SQUAMOUS EPITHELIAL CELLS SEEN     ABUNDANT GRAM POSITIVE COCCI IN CLUSTERS     Gram Stain Report Called to,Read Back By and Verified With: Gram Stain Report Called to,Read Back By and Verified With: P WEATHERLY RN 02/10/12 1725 BY ZOXWRUE Performed at [facility]   Culture     Final    Value: ABUNDANT STAPHYLOCOCCUS AUREUS     Note: RIFAMPIN AND GENTAMICIN SHOULD NOT BE USED AS SINGLE DRUGS FOR TREATMENT OF STAPH INFECTIONS.   Report Status PENDING   Incomplete     Studies/Results: Ct Chest Wo Contrast  02/10/2012  *RADIOLOGY REPORT*  Clinical Data: Chest wall abscess.  CT CHEST WITHOUT CONTRAST  Technique:  Multidetector CT imaging of the chest was performed following the standard protocol without IV contrast.  Comparison: 02/04/2012.  Findings: There is a large left-sided chest wall abscess containing fluid and air.  This is in the subpectoral space.  This is contiguous with a large intrathoracic/lung abscess.  This is likely an aggressive infection.  No destructive bony changes to suggest osteomyelitis.  Since the  prior study there is been a rapidly progressive multifocal infectious process in the lungs with multi focal infiltrates and probable small/early pulmonary abscesses.  Septic emboli is a strong possibility.  There are also  bilateral pleural effusions, right greater than left.  The upper abdomen is stable.  IMPRESSION:  1.  Large left-sided chest wall abscess communicates with the adjacent intrathoracic/lung abscess. 2.  Interval development of rapidly progressive bilateral pulmonary infections.  This appears be a combination of infiltrate and septic emboli. 3.  Enlarged mediastinal and hilar lymph nodes.   Original Report Authenticated By: Rudie Meyer, M.D.    Dg Chest Port 1 View  02/12/2012  *RADIOLOGY REPORT*  Clinical Data: Left chest wall abscess post chest exploration  PORTABLE CHEST - 1 VIEW  Comparison: Portable exam 0651 hours compared to 02/11/2012  Findings: Tip of endotracheal tube 5.8 cm above carina. Nasogastric tube extends into stomach. Left jugular central venous catheter tip projecting over SVC. Left thoracostomy tube at base. Stable heart size and mediastinal contours. Bilateral airspace infiltrates left greater than right increased at left base since previous exam. No gross pleural effusion or pneumothorax.  IMPRESSION: Bilateral airspace infiltrates, slightly increased at left base since previous study.   Original Report Authenticated By: Ulyses Southward, M.D.    Dg Chest Port 1 View  02/11/2012  *RADIOLOGY REPORT*  Clinical Data: Endotracheal tube placement.  PORTABLE CHEST - 1 VIEW  Comparison: 02/11/2012 at 0631 hours.  Findings: Endotracheal tube is in satisfactory position.  Left IJ central line tip projects over the SVC.  Left chest tube is in place.  Nasogastric tube is followed into the stomach.  Heart size normal. Biapical pleural thickening.  Patchy bilateral air space disease persists.  No pneumothorax.  No definite pleural fluid.  IMPRESSION:  1.  Endotracheal tube is in satisfactory position. 2.  Patchy bilateral air space disease. 3.  Left chest tube in place.  No definite pneumothorax.   Original Report Authenticated By: Leanna Battles, M.D.    Dg Chest Port 1 View  02/11/2012  *RADIOLOGY  REPORT*  Clinical Data: Postop wound exploration with irrigation and debridement of abscess  PORTABLE CHEST - 1 VIEW  Comparison: Portable chest x-ray of 02/10/2012  Findings: The tip of the endotracheal tube is 7.8 cm above the carina.  A left IJ central venous line tip is unchanged overlying the upper SVC.  A left chest tube is present and no left pneumothorax is noted.  Patchy airspace disease remains bilaterally.  Mild cardiomegaly is stable.  IMPRESSION: Stable chest x-ray with patchy airspace disease.  Endotracheal tube, left IJ central venous line, and left chest tube are unchanged.   Original Report Authenticated By: Dwyane Dee, M.D.    Dg Chest Port 1 View  02/10/2012  *RADIOLOGY REPORT*  Clinical Data: Central line placement.  PORTABLE CHEST - 1 VIEW  Comparison: Chest radiograph performed earlier today at 07:25 p.m.  Findings: The patient's left IJ line is seen ending about the proximal SVC.  An enteric tube is noted extending below the diaphragm.  The endotracheal tube is seen ending 5 cm above the carina.  There is persistent bilateral airspace opacification, favoring the right lung base and left lung apex.  As noted on recent CT, left apical airspace opacification reflects an intrathoracic abscess.  A left basilar chest tube is unchanged in position.  No pleural effusion or pneumothorax is seen.  The cardiomediastinal silhouette is normal in size.  No acute osseous abnormalities are identified.  Chronic  right-sided rib deformities are again seen.  IMPRESSION:  1.  Left IJ line seen ending about the proximal SVC. 2.  Persistent bilateral airspace opacification again noted; as noted on recent CT, left apical airspace opacification reflects an intrathoracic abscess.   Original Report Authenticated By: Tonia Ghent, M.D.    Dg Chest Port 1 View  02/10/2012  *RADIOLOGY REPORT*  Clinical Data: Decompression of chest wall abscess.  PORTABLE CHEST - 1 VIEW  Comparison: 02/10/2012.  Findings: The  endotracheal tube is in good position, 6 cm above the carina.  The NG tube is in the stomach.  A left-sided chest tube is present.  No definite left-sided pneumothorax.  The lungs show slight improved aeration.  There are persistent multifocal infiltrates.  IMPRESSION:  1.  Postoperative support apparatus in good position. 2.  No pneumothorax. 3.  Improved lung aeration but persistent bilateral infiltrates.   Original Report Authenticated By: Rudie Meyer, M.D.      Assessment/Plan: Chest Wall Abscess MRSA (bacteremia, chest abscess)  Total days of antibiotics: 5 (vanco)       Still febrile, WBC elevated Repeat BCx pending TEE pending He is not awake, so cannot eval for back pain or other sources of possible distant sites of seeding. Will continue his current anbx Would consider TEE to eval for seeding/source.  Will need long term anbx (at least 6 weeks)    Johny Sax Infectious Diseases [phone] 02/12/2012, 11:18 AM   LOS: 4 days

## 2012-02-12 NOTE — Op Note (Signed)
Rush Farmer, M.D. [facility] Pulmonary/Critical Care Medicine. Pager: [phone]. After hours pager: [phone].

## 2012-02-12 NOTE — Progress Notes (Signed)
SBT attempted. PS increased from 5-20. Pt appeared very aggitated, and had some obvious distress with accessory muscle use. Pt placed back on full support and re-sedated. Pt appears more comfortable back on full support. RT will continue to monitor.

## 2012-02-12 NOTE — Op Note (Signed)
NAMECAZ, WEAVER NO.:  [id number]  MEDICAL RECORD NO.:  [id number]  LOCATION:  2311                         FACILITY:  [facility]  PHYSICIAN:  Sheliah Plane, MD    DATE OF BIRTH:  14-Aug-1960  DATE OF PROCEDURE:  02/11/2012 DATE OF DISCHARGE:                              OPERATIVE REPORT   PREOPERATIVE DIAGNOSIS:  Staph sepsis with chest wall abscess and intrapleural abscess.  POSTOPERATIVE DIAGNOSIS:  Staph sepsis with chest wall abscess and intrapleural abscess.  SURGICAL PROCEDURE:  Re-exploration of left chest debridement site, further wound debridement, and placement of VAC.  SURGEON:  Sheliah Plane, MD  BRIEF HISTORY:  The patient's history is well documented.  He returns to the operating room today after having wound drainage and debridement, placement of chest tube, and drainage of intrathoracic abscess done the day prior.  Because of the extensive nature of the wound and with MRSA, repeat exploration was recommended.  The patient's wife agreed and signed informed consent.  DESCRIPTION OF PROCEDURE:  With the patient still intubated, he was brought directly from the surgical intensive care to the operating room. Appropriate time-out was performed.  The previously placed VAC was removed.  The anterior chest wall was prepped with Betadine and draped in sterile manner.  The previously opened wound was retracted, some areas of deep muscle were devitalized again sharply, debrided with scalpel.  The area between the second and third rib which had been used to enter the infection below the sternum was examined and secured. There was no further purulent material present.  The wound was again irrigated with Pulsavac.  A new VAC device was placed into the wound. Estimated blood loss approximately 100 mL.  The wound was same size as the day previous.  We will continue with bedside VAC wound dressing changes.  At the completion of the procedure, sponge and  needle count was reported as correct with exception of the VAC dressing left in the wound.     Sheliah Plane, MD     EG/MEDQ  D:  02/12/2012  T:  02/12/2012  Job:  [id number]

## 2012-02-13 ENCOUNTER — Ambulatory Visit (HOSPITAL_COMMUNITY)
Admission: RE | Admit: 2012-02-13 | Discharge: 2012-02-13 | Payer: Medicare HMO | Source: Ambulatory Visit | Attending: Internal Medicine | Admitting: Internal Medicine

## 2012-02-13 ENCOUNTER — Encounter (HOSPITAL_COMMUNITY): Payer: Self-pay | Admitting: Cardiothoracic Surgery

## 2012-02-13 ENCOUNTER — Ambulatory Visit: Payer: Medicare HMO | Admitting: Radiation Oncology

## 2012-02-13 ENCOUNTER — Other Ambulatory Visit (HOSPITAL_COMMUNITY): Payer: Medicare HMO

## 2012-02-13 ENCOUNTER — Ambulatory Visit (HOSPITAL_COMMUNITY): Admission: RE | Admit: 2012-02-13 | Payer: Medicare HMO | Source: Ambulatory Visit

## 2012-02-13 ENCOUNTER — Inpatient Hospital Stay (HOSPITAL_COMMUNITY): Payer: Medicare HMO

## 2012-02-13 ENCOUNTER — Ambulatory Visit: Payer: Medicare HMO

## 2012-02-13 DIAGNOSIS — M7989 Other specified soft tissue disorders: Secondary | ICD-10-CM

## 2012-02-13 LAB — CULTURE, ROUTINE-ABSCESS

## 2012-02-13 LAB — COMPREHENSIVE METABOLIC PANEL
ALT: 67 U/L — ABNORMAL HIGH (ref 0–53)
AST: 93 U/L — ABNORMAL HIGH (ref 0–37)
Albumin: 1.5 g/dL — ABNORMAL LOW (ref 3.5–5.2)
Alkaline Phosphatase: 143 U/L — ABNORMAL HIGH (ref 39–117)
BUN: 21 mg/dL (ref 6–23)
CO2: 26 mEq/L (ref 19–32)
Calcium: 8.3 mg/dL — ABNORMAL LOW (ref 8.4–10.5)
Chloride: 110 mEq/L (ref 96–112)
Creatinine, Ser: 0.7 mg/dL (ref 0.50–1.35)
GFR calc Af Amer: >90 mL/min (ref >90)
GFR calc non Af Amer: >90 mL/min (ref >90)
Glucose, Bld: 125 mg/dL — ABNORMAL HIGH (ref 70–99)
Potassium: 3.9 mEq/L (ref 3.5–5.1)
Sodium: 144 mEq/L (ref 135–145)
Total Bilirubin: 1 mg/dL (ref 0.3–1.2)
Total Protein: 5.9 g/dL — ABNORMAL LOW (ref 6.0–8.3)

## 2012-02-13 LAB — CBC
HCT: 27 % — ABNORMAL LOW (ref 39.0–52.0)
Hemoglobin: 9 g/dL — ABNORMAL LOW (ref 13.0–17.0)
MCH: 30.4 pg (ref 26.0–34.0)
MCHC: 33.3 g/dL (ref 30.0–36.0)
MCV: 91.2 fL (ref 78.0–100.0)
Platelets: 384 10*3/uL (ref 150–400)
RBC: 2.96 MIL/uL — ABNORMAL LOW (ref 4.22–5.81)
RDW: 16.2 % — ABNORMAL HIGH (ref 11.5–15.5)
WBC: 27.2 10*3/uL — ABNORMAL HIGH (ref 4.0–10.5)

## 2012-02-13 LAB — GLUCOSE, CAPILLARY
Glucose-Capillary: 120 mg/dL — ABNORMAL HIGH (ref 70–99)
Glucose-Capillary: 133 mg/dL — ABNORMAL HIGH (ref 70–99)

## 2012-02-13 LAB — PROCALCITONIN: Procalcitonin: 0.75 ng/mL

## 2012-02-13 MED ORDER — LIDOCAINE VISCOUS 2 % MT SOLN
OROMUCOSAL | Status: AC
  Filled 2012-02-13: qty 15

## 2012-02-13 MED ORDER — INSULIN ASPART 100 UNIT/ML ~~LOC~~ SOLN
2.0000 [IU] | SUBCUTANEOUS | Status: DC
  Administered 2012-02-13: 4 [IU] via SUBCUTANEOUS
  Administered 2012-02-13 – 2012-02-17 (×12): 2 [IU] via SUBCUTANEOUS

## 2012-02-13 NOTE — Progress Notes (Signed)
Patient ID: Frank Brock, male   DOB: Jul 01, 1960, 52 y.o.   MRN: [id number] [facility] DAILY PROGRESS NOTE                   [address]            [name] [address]          [phone]      2 Days Post-Op Procedure(s) (LRB): CHEST EXPLORATION (Left) MINOR APPLICATION OF WOUND VAC (Left)  Total Length of Stay:  LOS: 5 days   Subjective: Remains sedated on vent, opens eyes to name  Objective: Vital signs in last 24 hours: Temp:  [99.1 F (37.3 C)-101.8 F (38.8 C)] 100 F (37.8 C) (01/22 0430) Pulse Rate:  [91-117] 98  (01/22 0700) Cardiac Rhythm:  [-] Normal sinus rhythm (01/22 0600) Resp:  [15-31] 19  (01/22 0700) BP: (107-179)/(49-69) 120/55 mmHg (01/22 0400) SpO2:  [92 %-98 %] 93 % (01/22 0700) Arterial Line BP: (99-166)/(44-73) 127/54 mmHg (01/22 0700) FiO2 (%):  [30 %-40 %] 30 % (01/22 0600) Weight:  [186 lb 4.6 oz (84.5 kg)] 186 lb 4.6 oz (84.5 kg) (01/22 0500)  Filed Weights   02/10/12 2030 02/12/12 0300 02/13/12 0500  Weight: 193 lb 2 oz (87.6 kg) 190 lb 11.2 oz (86.5 kg) 186 lb 4.6 oz (84.5 kg)    Weight change: -4 lb 6.6 oz (-2 kg)   Hemodynamic parameters for last 24 hours:    Intake/Output from previous day: 01/21 0701 - 01/22 0700 In: 1952.2 [I.V.:1047.2; NG/GT:195; IV Piggyback:710] Out: 2305 [Urine:1895; Emesis/NG output:300; Drains:100; Chest Tube:10]  Intake/Output this shift:    Current Meds: Scheduled Meds:   . sodium chloride   Intravenous Once  . ipratropium  0.5 mg Nebulization Q6H   And  . albuterol  2.5 mg Nebulization Q6H  . antiseptic oral rinse  1 application Mouth Rinse QID  . bisacodyl  10 mg Oral Daily  . chlorhexidine  15 mL Mouth Rinse BID  . Chlorhexidine Gluconate Cloth  6 each Topical Q0600  . feeding supplement  30 mL Per Tube QID  . folic acid  1 mg Intravenous Daily  . mupirocin ointment  1 application Nasal BID  . pantoprazole (PROTONIX) IV  40 mg Intravenous Q24H  . sodium chloride  10-40 mL  Intracatheter Q12H  . thiamine  100 mg Intravenous Daily  . vancomycin  1,250 mg Intravenous Q8H   Continuous Infusions:   . feeding supplement (OXEPA) 1,000 mL (02/12/12 2348)  . fentaNYL infusion INTRAVENOUS 150 mcg/hr (02/13/12 0246)  . lactated ringers 20 mL/hr at 02/12/12 1825  . propofol 30 mcg/kg/min (02/13/12 0655)   PRN Meds:.acetaminophen (TYLENOL) oral liquid 160 mg/5 mL, albuterol, fentaNYL, hydrALAZINE, metoprolol, ondansetron (ZOFRAN) IV, potassium chloride, sodium chloride  General appearance: sedated on vent Neurologic: intact Heart: regular rate and rhythm, S1, S2 normal, no murmur, click, rub or gallop Lungs: diminished breath sounds bilaterally wound vac functioning, chest wall less swelling, still left arm swolled but withou obvius infection  Lab Results: CBC: Basename 02/13/12 0422 02/12/12 0400  WBC 27.2* 26.3*  HGB 9.0* 9.3*  HCT 27.0* 28.4*  PLT 384 332   BMET:  Basename 02/13/12 0422 02/12/12 0400  NA 144 138  K 3.9 3.8  CL 110 104  CO2 26 24  GLUCOSE 125* 116*  BUN 21 21  CREATININE 0.70 0.78  CALCIUM 8.3* 8.2*    PT/INR:  Basename 02/10/12 1900  LABPROT 16.7*  INR 1.39  Radiology: Dg Chest Port 1 View  02/12/2012  *RADIOLOGY REPORT*  Clinical Data: Left chest wall abscess post chest exploration  PORTABLE CHEST - 1 VIEW  Comparison: Portable exam 0651 hours compared to 02/11/2012  Findings: Tip of endotracheal tube 5.8 cm above carina. Nasogastric tube extends into stomach. Left jugular central venous catheter tip projecting over SVC. Left thoracostomy tube at base. Stable heart size and mediastinal contours. Bilateral airspace infiltrates left greater than right increased at left base since previous exam. No gross pleural effusion or pneumothorax.  IMPRESSION: Bilateral airspace infiltrates, slightly increased at left base since previous study.   Original Report Authenticated By: Ulyses Southward, M.D.    Dg Chest Port 1 View  02/11/2012   *RADIOLOGY REPORT*  Clinical Data: Endotracheal tube placement.  PORTABLE CHEST - 1 VIEW  Comparison: 02/11/2012 at 0631 hours.  Findings: Endotracheal tube is in satisfactory position.  Left IJ central line tip projects over the SVC.  Left chest tube is in place.  Nasogastric tube is followed into the stomach.  Heart size normal. Biapical pleural thickening.  Patchy bilateral air space disease persists.  No pneumothorax.  No definite pleural fluid.  IMPRESSION:  1.  Endotracheal tube is in satisfactory position. 2.  Patchy bilateral air space disease. 3.  Left chest tube in place.  No definite pneumothorax.   Original Report Authenticated By: Leanna Battles, M.D.      Assessment/Plan: S/P Procedure(s) (LRB): CHEST EXPLORATION (Left) MINOR APPLICATION OF WOUND VAC (Left) Vent per pul Continue wound vac, change tomorrow at bedside Started on tube feeding Check venous doppler left arm      Pravin Perezperez B 02/13/2012 7:33 AM

## 2012-02-13 NOTE — Anesthesia Postprocedure Evaluation (Signed)
  Anesthesia Post-op Note  Patient: Frank Brock  Procedure(s) Performed: Procedure(s) (LRB) with comments: CHEST EXPLORATION (Left) - re-exploration of left chest wall; with wound vac change MINOR APPLICATION OF WOUND VAC (Left) - wound vac change  Patient Location: SICU  Anesthesia Type:General  Level of Consciousness: sedated, confused and responds to stimulation on Fentanyl/Propofol gtt.   Airway and Oxygen Therapy: Patient placed on Ventilator (see vital sign flow sheet for setting), PS/CPAP. Remains intubated for planned TEE scheduled today.    Post-op Pain: none  Post-op Assessment: Post-op Vital signs reviewed, Patient's Cardiovascular Status Stable, Respiratory Function Stable, Patent Airway and No signs of Nausea or vomiting  Post-op Vital Signs: Reviewed and stable  Complications: No apparent anesthesia complications

## 2012-02-13 NOTE — Progress Notes (Signed)
eLink Physician-Brief Progress Note Patient Name: Frank Brock DOB: 17-Feb-1960 MRN: [id number]  Date of Service  02/13/2012   HPI/Events of Note  Attempts made to remove restraints but patient continues to require restraints for patient safety.  eICU Interventions  Plan: Order to renew restraints   Intervention Category Minor Interventions: Routine modifications to care plan (e.g. PRN medications for pain, fever);Agitation / anxiety - evaluation and management  Frank Brock 02/13/2012, 12:20 AM

## 2012-02-13 NOTE — Progress Notes (Signed)
VASCULAR LAB PRELIMINARY  PRELIMINARY  PRELIMINARY  PRELIMINARY  Left upper extremity venous duplex completed.    Preliminary report:  LEFT:  DVT noted in the subclavian vein and axillary vein.  Jugular not imaged due to IJ central line.    Caressa Scearce, RVT 02/13/2012, 1:28 PM

## 2012-02-13 NOTE — Progress Notes (Signed)
  Came to do TEE. Patient still receiving tube feeds so we could not perform.  Please make NPO after MN and we will schedule for tomorrow.   Thanks.  Xai Frerking,MD 11:32 AM

## 2012-02-13 NOTE — Progress Notes (Signed)
PCCM Progress Note  Pt profile: 82M with recently discovered LUL/chest wall mass, admitted 1/17 with severe chest wall pain and hypoxemia, suspected PNA.  Further history obtained 1/19: Pt developed  Small lesion on middle finger of L hand around 1/03. Developed severe upper L chest pain 1/08 and seen by primary MD 1/09. CXR normal @ that time. 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 Seen by Onc 1/15 and plans for eval of presumed lung cancer. Admitted via ED 1/17 with severe chest pain, hypoxemia, presumed PNA and continued presumption of LUL malignancy. Blood cultures drawn, abx started. Blood cx's turned positive on 1/18.   Studies/Events: 1/13 CT chest: Left upper lobe soft tissue mass anteriorly abutting the pleura  of 7.3 x 4.4 x 5.0 cm. There does appear to be surrounding infiltrate. This is worrisome for primary lung carcinoma. A portion of this soft tissue mass extends through the left anterior chest wall and involves the left pectoralis musculature. Multiple bilateral nodules worrisome for mets 1/19 concern for chest wall abscess rather than malignancy. Transferred to [facility] for TCTS eval 1/19 Irrigation and debridement L chest wall abscess, mini-thoracotomy, placement of VAC and L chest tube Tyrone Sage) 1/20 TTE >> normal LV fxn but grade 2 diastolic dysfxn, mild L atrial dilation, poor view of AV, no evidence MR  Lines, Tubes, etc: ETT 1/19 >>  L IJ CVL 1/19 >>  R radial A-line 1/19 >>   Microbiology: MRSA PCR 1/17 >> POSITIVE Resp 1/17 >> MRSA Urine 1/17 >> negative Blood 1/17 >> 2/2  MRSA  Blood 1/18 >>  Abscess 1/19 >> MRSA  Antibiotics:  Osletamivir 1/17 >> 1/19 Zosyn 1/17 >> 1/20 Vanc 1/17 >>   Consults:  TCTS ID  Best Practice: DVT: LMWH 1/18 SUP: N/I Nutrition: Reg diet Glycemic control: N/I  Sedation/analgesia: propofol, fentanyl  Subj: Agitated with WUA LUE  swelling  Obj: Filed Vitals:   02/13/12 0827  BP:   Pulse:   Temp: 101.3 F (38.5 C)  Resp:     Intake/Output Summary (Last 24 hours) at 02/13/12 4742 Last data filed at 02/13/12 0700  Gross per 24 hour  Intake 1952.15 ml  Output   2100 ml  Net -147.85 ml   Gen: chronically ill appearing HEENT: WNL Neck: Fullness in L side of neck and supraclav region. No discrete nodes Chest: L upper chest wound,  Diffuse scattered rhonchi Cardiac: tachy, reg, no M Abd: soft, NT, NABS Ext: warm, no edema, erythematous L middle finger with mild L forearm erythema  BMET  Lab 02/13/12 0422 02/12/12 0400 02/11/12 0359 02/10/12 1900 02/10/12 0358 02/09/12 0335  NA 144 138 132* 132* 131* --  K 3.9 3.8 -- -- -- --  CL 110 104 100 100 96 --  CO2 26 24 24 25 26  --  GLUCOSE 125* 116* 121* 135* 130* --  BUN 21 21 29* 26* 29* --  CREATININE 0.70 0.78 0.89 0.74 0.90 --  CALCIUM 8.3* 8.2* 7.7* 7.6* 8.6 --  MG -- 2.1 -- -- -- 2.5  PHOS -- 2.9 -- -- -- 4.1    CBC  Lab 02/13/12 0422 02/12/12 0400 02/11/12 0359  HGB 9.0* 9.3* 9.3*  HCT 27.0* 28.4* 26.9*  WBC 27.2* 26.3* 25.6*  PLT 384 332 219    Lab 02/13/12 0422 02/11/12 0359 02/09/12 0335  PROCALCITON 0.75 2.81 9.36    CXR: 1/22 LUL opacity resolving, stable B AS dz,  L chest tube  IMPRESSION: Patient Active Problem List  Diagnosis  . H/O HTN  . H/O hypercholesteremia  . H/O stroke  . Acute respiratory failure  . Hypoxemia  . Pain, chest wall  . Chest wall abscess  . Lung abscess  . Staphylococcus aureus bacteremia with sepsis  . Status post thoracotomy, 1/19 Tyrone Sage   PLAN/RECS:  - Follow CXR - resolution of L upper opacity and findings at surgery suggest abscess was in pleural space, not the LUL. Continues to drain adequately. Plan is for further debridement/dressing changes at bedside, next 1/23 - Vanco per ID recs - Blood cx from 1/18 (on abx) pending. If positive then will change lines. NO PICC placement until we  confirm cx's have gone negative - ? Timing for repeat imaging, CT scan. Will discuss with Dr Tyrone Sage, depends on clinical course and CXR appearance - Will arrange for TEE to better eval for possible endocarditis as this will affect duration of abx therapy > probably 1/22 - LUE doppler US to r/o DVT - Begin WUA's and SBT's, no extubation 1/22 awaiting TEE - Propofol for sedation - Note he will likely be at risk for EtOH withdrawal when we move to extubation; thiamine, folate - Acute renal failure resolved with volume resuscitation - follow BMP  40 minutes CC time   Levy Pupa, MD, PhD 02/13/2012, 9:52 AM [facility] [phone] or if no answer [phone]

## 2012-02-13 NOTE — Consult Note (Signed)
WOC consult Note Reason for Consult: asked per staff to help them troubleshoot VAC dressing. Spoke with Dr. Tyrone Sage as he was planning bedside change in the am.  Changed canister and reapply TRAC pad to attempt to correct issues with alarms on machines, drainage is very thick which was concerning that it had blocked the sponge.  Dr. Tyrone Sage ok with me changing at the bedside today.  Wound type:s/p debridement of chest wall abscess  Measurement: 3cm x 8.5cm x 2cm  Wound WUJ:WJXB, muscle viable with no necrotic tissue visualized  Drainage (amount, consistency, odor) thick, sanguinous drainage in canister and occluding TRAC pad when first changed, minimal amount Periwound:intact Dressing procedure/placement/frequency: 1pc. Black granufoam placed in wound, drape and seal obtained at , pt sedated but does open eyes for me during dressing.  Tolerated well.  Will place VAC orders in computer.   WOC will follow along with CVTS for assistance with VAC dressings as needed. Donovan Gatchel [place] RN,CWOCN [phone]

## 2012-02-14 ENCOUNTER — Inpatient Hospital Stay (HOSPITAL_COMMUNITY): Payer: Medicare HMO

## 2012-02-14 ENCOUNTER — Encounter (HOSPITAL_COMMUNITY): Payer: Self-pay | Admitting: Certified Registered"

## 2012-02-14 ENCOUNTER — Encounter (HOSPITAL_COMMUNITY)
Admission: EM | Disposition: A | Discharge status: Rehabilitation Facility | Payer: Self-pay | Source: Home / Self Care | Attending: Pulmonary Disease

## 2012-02-14 ENCOUNTER — Inpatient Hospital Stay (HOSPITAL_COMMUNITY): Payer: Medicare HMO | Admitting: Certified Registered"

## 2012-02-14 DIAGNOSIS — G8220 Paraplegia, unspecified: Secondary | ICD-10-CM | POA: Diagnosis not present

## 2012-02-14 DIAGNOSIS — G8250 Quadriplegia, unspecified: Secondary | ICD-10-CM | POA: Diagnosis present

## 2012-02-14 DIAGNOSIS — I82629 Acute embolism and thrombosis of deep veins of unspecified upper extremity: Secondary | ICD-10-CM | POA: Diagnosis present

## 2012-02-14 DIAGNOSIS — I339 Acute and subacute endocarditis, unspecified: Secondary | ICD-10-CM

## 2012-02-14 HISTORY — PX: POSTERIOR CERVICAL LAMINECTOMY FOR EPIDURAL ABSCESS: SHX6034

## 2012-02-14 LAB — BASIC METABOLIC PANEL
BUN: 21 mg/dL (ref 6–23)
CO2: 28 mEq/L (ref 19–32)
Calcium: 8.1 mg/dL — ABNORMAL LOW (ref 8.4–10.5)
Chloride: 112 mEq/L (ref 96–112)
Creatinine, Ser: 0.75 mg/dL (ref 0.50–1.35)
GFR calc Af Amer: >90 mL/min (ref >90)
GFR calc non Af Amer: >90 mL/min (ref >90)
Glucose, Bld: 148 mg/dL — ABNORMAL HIGH (ref 70–99)
Potassium: 4.1 mEq/L (ref 3.5–5.1)
Sodium: 145 mEq/L (ref 135–145)

## 2012-02-14 LAB — POCT I-STAT 7, (LYTES, BLD GAS, ICA,H+H)
Acid-base deficit: 1 mmol/L (ref 0.0–2.0)
Bicarbonate: 26.9 mEq/L — ABNORMAL HIGH (ref 20.0–24.0)
Calcium, Ion: 1.01 mmol/L — ABNORMAL LOW (ref 1.12–1.23)
HCT: 25 % — ABNORMAL LOW (ref 39.0–52.0)
Hemoglobin: 8.5 g/dL — ABNORMAL LOW (ref 13.0–17.0)
O2 Saturation: 100 %
Patient temperature: 37.3
Patient temperature: 37.5
TCO2: 28 mmol/L (ref 0–100)
pCO2 arterial: 47.8 mmHg — ABNORMAL HIGH (ref 35.0–45.0)
pCO2 arterial: 47.9 mmHg — ABNORMAL HIGH (ref 35.0–45.0)
pH, Arterial: 7.325 — ABNORMAL LOW (ref 7.350–7.450)
pO2, Arterial: 299 mmHg — ABNORMAL HIGH (ref 80.0–100.0)
pO2, Arterial: 337 mmHg — ABNORMAL HIGH (ref 80.0–100.0)

## 2012-02-14 LAB — TYPE AND SCREEN
ABO/RH(D): A NEG
Antibody Screen: NEGATIVE
Unit division: 0
Unit division: 0
Unit division: 0
Unit division: 0
Unit division: 0
Unit division: 0

## 2012-02-14 LAB — GLUCOSE, CAPILLARY
Glucose-Capillary: 132 mg/dL — ABNORMAL HIGH (ref 70–99)
Glucose-Capillary: 132 mg/dL — ABNORMAL HIGH (ref 70–99)

## 2012-02-14 LAB — GRAM STAIN

## 2012-02-14 LAB — CBC
HCT: 26.4 % — ABNORMAL LOW (ref 39.0–52.0)
Hemoglobin: 8.5 g/dL — ABNORMAL LOW (ref 13.0–17.0)
MCH: 29.9 pg (ref 26.0–34.0)
MCHC: 32.2 g/dL (ref 30.0–36.0)
MCV: 93 fL (ref 78.0–100.0)
Platelets: 477 10*3/uL — ABNORMAL HIGH (ref 150–400)
RBC: 2.84 MIL/uL — ABNORMAL LOW (ref 4.22–5.81)
RDW: 16.1 % — ABNORMAL HIGH (ref 11.5–15.5)
WBC: 26.8 10*3/uL — ABNORMAL HIGH (ref 4.0–10.5)

## 2012-02-14 LAB — HEPARIN LEVEL (UNFRACTIONATED): Heparin Unfractionated: <0.1 IU/mL — ABNORMAL LOW (ref 0.30–0.70)

## 2012-02-14 SURGERY — POSTERIOR CERVICAL LAMINECTOMY FOR EPIDURAL ABSCESS
Anesthesia: General | Site: Neck | Wound class: Dirty or Infected

## 2012-02-14 MED ORDER — GADOBENATE DIMEGLUMINE 529 MG/ML IV SOLN
20.0000 mL | Freq: Once | INTRAVENOUS | Status: AC
  Administered 2012-02-14: 20 mL via INTRAVENOUS

## 2012-02-14 MED ORDER — ACETAMINOPHEN 650 MG RE SUPP
650.0000 mg | RECTAL | Status: DC | PRN
  Administered 2012-02-15: 650 mg via RECTAL
  Filled 2012-02-14 (×2): qty 1

## 2012-02-14 MED ORDER — HEMOSTATIC AGENTS (NO CHARGE) OPTIME
TOPICAL | Status: DC | PRN
  Administered 2012-02-14: 1 via TOPICAL

## 2012-02-14 MED ORDER — VECURONIUM BROMIDE 10 MG IV SOLR
INTRAVENOUS | Status: DC | PRN
  Administered 2012-02-14: 10 mg via INTRAVENOUS
  Administered 2012-02-14: 5 mg via INTRAVENOUS
  Administered 2012-02-14: 10 mg via INTRAVENOUS

## 2012-02-14 MED ORDER — LIDOCAINE-EPINEPHRINE 1 %-1:100000 IJ SOLN
INTRAMUSCULAR | Status: DC | PRN
  Administered 2012-02-14: 10 mL

## 2012-02-14 MED ORDER — SODIUM CHLORIDE 0.9 % IR SOLN
Status: DC | PRN
  Administered 2012-02-14: 17:00:00

## 2012-02-14 MED ORDER — OXYCODONE HCL 5 MG PO TABS: 5.0000 mg | ORAL_TABLET | Freq: Once | ORAL | Status: AC | PRN

## 2012-02-14 MED ORDER — MEPERIDINE HCL 25 MG/ML IJ SOLN: 6.2500 mg | INTRAMUSCULAR | Status: DC | PRN

## 2012-02-14 MED ORDER — SODIUM CHLORIDE 0.9 % IJ SOLN
3.0000 mL | Freq: Two times a day (BID) | INTRAMUSCULAR | Status: DC
  Administered 2012-02-14 – 2012-02-20 (×3): 3 mL via INTRAVENOUS

## 2012-02-14 MED ORDER — ONDANSETRON HCL 4 MG/2ML IJ SOLN: 4.0000 mg | Freq: Once | INTRAMUSCULAR | Status: DC | PRN

## 2012-02-14 MED ORDER — HYDROMORPHONE HCL PF 1 MG/ML IJ SOLN: 0.2500 mg | INTRAMUSCULAR | Status: DC | PRN

## 2012-02-14 MED ORDER — PROPOFOL 10 MG/ML IV BOLUS
INTRAVENOUS | Status: DC | PRN
  Administered 2012-02-14: 150 mg via INTRAVENOUS

## 2012-02-14 MED ORDER — FENTANYL CITRATE 0.05 MG/ML IJ SOLN
INTRAMUSCULAR | Status: DC | PRN
  Administered 2012-02-14: 50 ug via INTRAVENOUS
  Administered 2012-02-14 (×2): 100 ug via INTRAVENOUS

## 2012-02-14 MED ORDER — LACTATED RINGERS IV SOLN
INTRAVENOUS | Status: DC | PRN
  Administered 2012-02-14: 16:00:00 via INTRAVENOUS

## 2012-02-14 MED ORDER — KCL IN DEXTROSE-NACL 20-5-0.45 MEQ/L-%-% IV SOLN
INTRAVENOUS | Status: DC
  Administered 2012-02-14 – 2012-02-16 (×3): via INTRAVENOUS
  Filled 2012-02-14 (×8): qty 1000

## 2012-02-14 MED ORDER — ONDANSETRON HCL 4 MG/2ML IJ SOLN: 4.0000 mg | INTRAMUSCULAR | Status: DC | PRN

## 2012-02-14 MED ORDER — HEPARIN (PORCINE) IN NACL 100-0.45 UNIT/ML-% IJ SOLN
1450.0000 [IU]/h | INTRAMUSCULAR | Status: DC
  Administered 2012-02-14: 1450 [IU]/h via INTRAVENOUS
  Filled 2012-02-14 (×2): qty 250

## 2012-02-14 MED ORDER — PANTOPRAZOLE SODIUM 40 MG PO PACK
40.0000 mg | PACK | Freq: Every day | ORAL | Status: DC
  Administered 2012-02-15 – 2012-02-16 (×2): 40 mg
  Filled 2012-02-14 (×4): qty 20

## 2012-02-14 MED ORDER — OXYCODONE HCL 5 MG/5ML PO SOLN: 5.0000 mg | Freq: Once | ORAL | Status: AC | PRN

## 2012-02-14 MED ORDER — PHENOL 1.4 % MT LIQD: 1.0000 | OROMUCOSAL | Status: DC | PRN

## 2012-02-14 MED ORDER — ACETAMINOPHEN 325 MG PO TABS
650.0000 mg | ORAL_TABLET | ORAL | Status: DC | PRN
  Administered 2012-02-15 – 2012-02-16 (×5): 650 mg via ORAL
  Filled 2012-02-14 (×5): qty 2

## 2012-02-14 MED ORDER — THROMBIN 5000 UNITS EX SOLR
CUTANEOUS | Status: DC | PRN
  Administered 2012-02-14 (×2): 5000 [IU] via TOPICAL

## 2012-02-14 MED ORDER — PROPOFOL INFUSION 10 MG/ML OPTIME
INTRAVENOUS | Status: DC | PRN
  Administered 2012-02-14: 70 ug/kg/min via INTRAVENOUS

## 2012-02-14 MED ORDER — BUPIVACAINE HCL 0.5 % IJ SOLN
INTRAMUSCULAR | Status: DC | PRN
  Administered 2012-02-14: 10 mL

## 2012-02-14 MED ORDER — SODIUM CHLORIDE 0.9 % IJ SOLN: 3.0000 mL | INTRAMUSCULAR | Status: DC | PRN

## 2012-02-14 MED ORDER — MENTHOL 3 MG MT LOZG
1.0000 | LOZENGE | OROMUCOSAL | Status: DC | PRN
  Filled 2012-02-14: qty 9

## 2012-02-14 MED ORDER — SODIUM CHLORIDE 0.9 % IV SOLN
2500.0000 ug | INTRAVENOUS | Status: DC | PRN
  Administered 2012-02-14: 300 ug/h via INTRAVENOUS

## 2012-02-14 MED ORDER — SODIUM CHLORIDE 0.9 % IV SOLN: 250.0000 mL | INTRAVENOUS | Status: DC

## 2012-02-14 MED ORDER — 0.9 % SODIUM CHLORIDE (POUR BTL) OPTIME
TOPICAL | Status: DC | PRN
  Administered 2012-02-14: 1000 mL

## 2012-02-14 MED ORDER — PHENYLEPHRINE HCL 10 MG/ML IJ SOLN
10.0000 mg | INTRAVENOUS | Status: DC | PRN
  Administered 2012-02-14: 10 ug/min via INTRAVENOUS

## 2012-02-14 MED ORDER — MIDAZOLAM HCL 5 MG/5ML IJ SOLN
INTRAMUSCULAR | Status: DC | PRN
  Administered 2012-02-14: 2 mg via INTRAVENOUS

## 2012-02-14 MED ORDER — VANCOMYCIN HCL 1000 MG IV SOLR
1000.0000 mg | INTRAVENOUS | Status: DC | PRN
  Administered 2012-02-14: 1000 mg via INTRAVENOUS

## 2012-02-14 SURGICAL SUPPLY — 49 items
BAG DECANTER FOR FLEXI CONT (MISCELLANEOUS) ×2 IMPLANT
BENZOIN TINCTURE PRP APPL 2/3 (GAUZE/BANDAGES/DRESSINGS) ×2 IMPLANT
BIT DRILL NEURO 2X3.1 SFT TUCH (MISCELLANEOUS) ×1 IMPLANT
BLADE SURG ROTATE 9660 (MISCELLANEOUS) IMPLANT
CANISTER SUCTION 2500CC (MISCELLANEOUS) ×2 IMPLANT
CLOTH BEACON ORANGE TIMEOUT ST (SAFETY) ×2 IMPLANT
CONT SPEC 4OZ CLIKSEAL STRL BL (MISCELLANEOUS) ×2 IMPLANT
DRAPE LAPAROTOMY 100X72 PEDS (DRAPES) ×2 IMPLANT
DRAPE LAPAROTOMY T 102X78X121 (DRAPES) IMPLANT
DRAPE POUCH INSTRU U-SHP 10X18 (DRAPES) ×4 IMPLANT
DRILL NEURO 2X3.1 SOFT TOUCH (MISCELLANEOUS) ×2
DURAPREP 6ML APPLICATOR 50/CS (WOUND CARE) ×2 IMPLANT
ELECT REM PT RETURN 9FT ADLT (ELECTROSURGICAL) ×2
ELECTRODE REM PT RTRN 9FT ADLT (ELECTROSURGICAL) ×1 IMPLANT
EVACUATOR 1/8 PVC DRAIN (DRAIN) ×2 IMPLANT
GLOVE BIO SURGEON STRL SZ 6.5 (GLOVE) ×2 IMPLANT
GLOVE BIO SURGEON STRL SZ8 (GLOVE) ×2 IMPLANT
GLOVE BIOGEL PI IND STRL 8 (GLOVE) ×1 IMPLANT
GLOVE BIOGEL PI IND STRL 8.5 (GLOVE) ×1 IMPLANT
GLOVE BIOGEL PI INDICATOR 8 (GLOVE) ×1
GLOVE BIOGEL PI INDICATOR 8.5 (GLOVE) ×1
GLOVE ECLIPSE 7.5 STRL STRAW (GLOVE) ×2 IMPLANT
GLOVE INDICATOR 7.0 STRL GRN (GLOVE) ×2 IMPLANT
GLOVE SURG SS PI 6.5 STRL IVOR (GLOVE) ×2 IMPLANT
GOWN BRE IMP SLV AUR LG STRL (GOWN DISPOSABLE) ×2 IMPLANT
GOWN BRE IMP SLV AUR XL STRL (GOWN DISPOSABLE) ×4 IMPLANT
GOWN STRL REIN 2XL LVL4 (GOWN DISPOSABLE) IMPLANT
KIT BASIN OR (CUSTOM PROCEDURE TRAY) ×2 IMPLANT
KIT ROOM TURNOVER OR (KITS) ×2 IMPLANT
NEEDLE HYPO 25X1 1.5 SAFETY (NEEDLE) ×2 IMPLANT
NS IRRIG 1000ML POUR BTL (IV SOLUTION) ×2 IMPLANT
PACK LAMINECTOMY NEURO (CUSTOM PROCEDURE TRAY) ×2 IMPLANT
SPONGE GAUZE 4X4 12PLY (GAUZE/BANDAGES/DRESSINGS) ×2 IMPLANT
SPONGE SURGIFOAM ABS GEL SZ50 (HEMOSTASIS) ×2 IMPLANT
STAPLER SKIN PROX WIDE 3.9 (STAPLE) ×2 IMPLANT
STRIP CLOSURE SKIN 1/2X4 (GAUZE/BANDAGES/DRESSINGS) ×2 IMPLANT
SUT ETHILON 3 0 FSL (SUTURE) ×2 IMPLANT
SUT VIC AB 0 CT1 18XCR BRD8 (SUTURE) ×1 IMPLANT
SUT VIC AB 0 CT1 8-18 (SUTURE) ×1
SUT VIC AB 2-0 CT1 18 (SUTURE) ×2 IMPLANT
SUT VIC AB 3-0 SH 8-18 (SUTURE) ×2 IMPLANT
SWAB CULTURE LIQ STUART DBL (MISCELLANEOUS) ×2 IMPLANT
SYR 20ML ECCENTRIC (SYRINGE) ×2 IMPLANT
TAPE CLOTH SURG 4X10 WHT LF (GAUZE/BANDAGES/DRESSINGS) ×2 IMPLANT
TOWEL OR 17X24 6PK STRL BLUE (TOWEL DISPOSABLE) ×2 IMPLANT
TOWEL OR 17X26 10 PK STRL BLUE (TOWEL DISPOSABLE) ×2 IMPLANT
TRAP SPECIMEN MUCOUS 40CC (MISCELLANEOUS) ×2 IMPLANT
TUBE ANAEROBIC SPECIMEN COL (MISCELLANEOUS) ×2 IMPLANT
WATER STERILE IRR 1000ML POUR (IV SOLUTION) ×2 IMPLANT

## 2012-02-14 NOTE — Anesthesia Postprocedure Evaluation (Signed)
  Anesthesia Post-op Note  Patient: Frank Brock  Procedure(s) Performed: Procedure(s) (LRB) with comments: POSTERIOR CERVICAL LAMINECTOMY FOR EPIDURAL ABSCESS (N/A) - Cervical Laminectomy  for Epidural Abscess  Patient Location: SICU  Anesthesia Type:General  Level of Consciousness: sedated and Patient remains intubated per anesthesia plan  Airway and Oxygen Therapy: Patient remains intubated per anesthesia plan and Patient placed on Ventilator (see vital sign flow sheet for setting)  Post-op Pain: none  Post-op Assessment: Post-op Vital signs reviewed, Patient's Cardiovascular Status Stable and Respiratory Function Stable  Post-op Vital Signs: stable  Complications: No apparent anesthesia complications

## 2012-02-14 NOTE — Brief Op Note (Signed)
02/08/2012 - 02/14/2012  6:27 PM  PATIENT:  Frank Brock  52 y.o. male  PRE-OPERATIVE DIAGNOSIS:  Cervical pidural abscess with acute quadriplegia  POST-OPERATIVE DIAGNOSIS: Cervical pidural abscess with acute quadriplegia  PROCEDURE:  Procedure(s) (LRB) with comments: POSTERIOR CERVICAL LAMINECTOMY FOR EPIDURAL ABSCESS (N/A) - Cervical Laminectomy  for Epidural Abscess C 3- C 7  SURGEON:  Surgeon(s) and Role:    * Maeola Harman, MD - Primary  PHYSICIAN ASSISTANT:   ASSISTANTS: Poteat, RN   ANESTHESIA:   general  EBL:  Total I/O In: 1595.2 [I.V.:1595.2] Out: 1025 [Urine:1025]  BLOOD ADMINISTERED:none  DRAINS: (Medium) Hemovact drain(s) in the epidural space with  Suction Open   LOCAL MEDICATIONS USED:  LIDOCAINE   SPECIMEN:  Excision  DISPOSITION OF SPECIMEN:  PATHOLOGY  COUNTS:  YES  TOURNIQUET:  * No tourniquets in log *  DICTATION: DICTATION: Patient is 52 year old man with acute quadriplegia with MRSA sepsis with MRI showing cervical cord compression and edema.  It was elected to take the patient to surgery for posterior cervical decompression on an emergent basis. He is on the ventilator with chest tube and Vac dressing for chest infection and is being treated for MRSA sepsis and septic shock.  PROCEDURE: Patient was brought to the OR and after induction of anesthesia,  patient was placed in 3 pin fixation and rolled into a prone position on the OR table in the cervical collar. Posterior neck was shaved with clippers, prepped and draped in the usual fashion with betadine scrub followed by Duraprep.  Area of planned incision was infiltrated with local lidocaine.  Incision was made from C2-C7 and carried through the avascular midline plane to expose these lavels and their lateral masses.  An intraoperative Xray was obtained with clips at C2 and C3.  The spinous processes of C3-C6 were then removed and the laminae were thinned with a Leksell rongeur. A total laminectomy  was performed from the C2 lamina all the way to the top of C7 and the foraminae were also decompressed at each level. The spinal cord dura appeared to be pulsatile and well decompressed.  There were areas of granulation and fat necrosis with thrombosed epidural veins and acute inflammation without frank pus.  Samples were sent to Pathology for evaluation along with swabs for aerobic and anaerobic culture. Hemostasis was assured and a medium Hemovac drain was placed in the epidural space. Wound was closed with 0, 2-0, and 3-0 vicryl sutures. Sterile occlusive dressing was placed with benzoin, steristrips and Telfa, gauze and tape.  Patient was taken out of pins and turned back onto the OR table.  Patient was taken to recovery in stable and satisfactory condition still intubated.  Counts were correct at the end of the case.  PLAN OF CARE: Admit to inpatient   PATIENT DISPOSITION:  PACU - hemodynamically stable.   Delay start of Pharmacological VTE agent (>24hrs) due to surgical blood loss or risk of bleeding: yes

## 2012-02-14 NOTE — Progress Notes (Signed)
TEE performed to R/O vegetation; patient already sedated on vent; omniplane probe passed without difficulty; normal LV function; no vegetations identified. See full report in [employer] Olga Millers 3:38 PM

## 2012-02-14 NOTE — Preoperative (Signed)
Beta Blockers   Reason not to administer Beta Blockers:Not Applicable 

## 2012-02-14 NOTE — Transfer of Care (Signed)
Immediate Anesthesia Transfer of Care Note  Patient: Frank Brock  Procedure(s) Performed: Procedure(s) (LRB) with comments: POSTERIOR CERVICAL LAMINECTOMY FOR EPIDURAL ABSCESS (N/A) - Cervical Laminectomy  for Epidural Abscess  Patient Location: SICU  Anesthesia Type:General  Level of Consciousness: sedated  Airway & Oxygen Therapy: Patient remains intubated per anesthesia plan and Patient placed on Ventilator (see vital sign flow sheet for setting)  Post-op Assessment: Report given to PACU RN and Post -op Vital signs reviewed and stable  Post vital signs: Reviewed and stable  Complications: No apparent anesthesia complications

## 2012-02-14 NOTE — Progress Notes (Signed)
eLink Physician-Brief Progress Note Patient Name: Frank Brock DOB: 10/10/1960 MRN: [id number]  Date of Service  02/14/2012   HPI/Events of Note  Preliminary report of DVT in left subclavian and axillary vein.  Patient s/p abd wall surgery 2 days ago.   eICU Interventions  Plan: Initiate heparin therapy. Monitor for bleeding s/p surgery   Intervention Category Intermediate Interventions: Diagnostic test evaluation  Dinesha Twiggs 02/14/2012, 1:26 AM

## 2012-02-14 NOTE — Progress Notes (Signed)
ANTICOAGULATION CONSULT NOTE - Initial Consult  Pharmacy Consult for heparin Indication: DVT  No Known Allergies  Patient Measurements: Height: 5\' 10"  (177.8 cm) Weight: 192 lb 7.4 oz (87.3 kg) IBW/kg (Calculated) : 73  Heparin Dosing Weight: 85 kg   Vital Signs: Temp: 101.1 F (38.4 C) (01/23 0821) Temp src: Axillary (01/23 0821) Pulse Rate: 103  (01/23 0900)  Labs:  Basename 02/14/12 0830 02/14/12 0429 02/13/12 0422 02/12/12 0400  HGB -- 8.5* 9.0* --  HCT -- 26.4* 27.0* 28.4*  PLT -- 477* 384 332  APTT -- -- -- --  LABPROT -- -- -- --  INR -- -- -- --  HEPARINUNFRC <0.10* -- -- --  CREATININE -- 0.75 0.70 0.78  CKTOTAL -- -- -- --  CKMB -- -- -- --  TROPONINI -- -- -- --    Estimated Creatinine Clearance: 114.1 ml/min (by C-G formula based on Cr of 0.75).   Medical History: Past Medical History  Diagnosis Date  . Hypercholesteremia 02/06/2012  . Stroke 02/06/2012    09/13 secondary to hypertensive crisis.  . Lung cancer     Medications:  Scheduled:     . sodium chloride   Intravenous Once  . ipratropium  0.5 mg Nebulization Q6H   And  . albuterol  2.5 mg Nebulization Q6H  . antiseptic oral rinse  1 application Mouth Rinse QID  . bisacodyl  10 mg Oral Daily  . chlorhexidine  15 mL Mouth Rinse BID  . [EXPIRED] Chlorhexidine Gluconate Cloth  6 each Topical Q0600  . feeding supplement  30 mL Per Tube QID  . folic acid  1 mg Intravenous Daily  . insulin aspart  2-6 Units Subcutaneous Q4H  . [COMPLETED] mupirocin ointment  1 application Nasal BID  . pantoprazole sodium  40 mg Per Tube Daily  . sodium chloride  10-40 mL Intracatheter Q12H  . thiamine  100 mg Intravenous Daily  . vancomycin  1,250 mg Intravenous Q8H  . [DISCONTINUED] pantoprazole (PROTONIX) IV  40 mg Intravenous Q24H    Assessment: 52 yo male known to pharmacy from vancomycin management. Pharmacy now to manage IV heparin due to preliminary report of DVT in left subclavian and axillary  vein. Patient with recent OR visits (1/19 and 1/20)  for L chest debridement, wound debridement, VAC placement, and mini thoracotomy. Heparin level is subtherapeutic. However, heparin just got turn off to r/o a bleed.   Goal of Therapy:  Heparin level 0.3-0.7 units/ml Monitor platelets by anticoagulation protocol: Yes   Plan:  1. F/u with heparin after MRI

## 2012-02-14 NOTE — Progress Notes (Addendum)
INFECTIOUS DISEASE PROGRESS NOTE  ID: Frank Brock is a 52 y.o. male with  Principal Problem:  *Staphylococcus aureus bacteremia with sepsis Active Problems:  H/O hypercholesteremia  H/O stroke  Acute respiratory failure  Chest wall abscess  Lung abscess  Status post thoracotomy, 1/19 - [facility]  Paraplegia  DVT of upper extremity (deep vein thrombosis)  Subjective: Eyes open, did not respond to voice  Abtx:  Anti-infectives     Start     Dose/Rate Route Frequency Ordered Stop   02/12/12 2000   vancomycin (VANCOCIN) 1,250 mg in sodium chloride 0.9 % 250 mL IVPB        1,250 mg 166.7 mL/hr over 90 Minutes Intravenous Every 8 hours 02/12/12 1333     02/11/12 1100   vancomycin (VANCOCIN) IVPB 1000 mg/200 mL premix  Status:  Discontinued        1,000 mg 200 mL/hr over 60 Minutes Intravenous Every 8 hours 02/11/12 1042 02/12/12 1332   02/11/12 0045   vancomycin (VANCOCIN) IVPB 1000 mg/200 mL premix  Status:  Discontinued     Comments: CONTINUE TILL FURTHER NOTICE      1,000 mg 200 mL/hr over 60 Minutes Intravenous Every 12 hours 02/10/12 1803 02/11/12 1041   02/10/12 2200   ceFAZolin (ANCEF) IVPB 2 g/50 mL premix  Status:  Discontinued        2 g 100 mL/hr over 30 Minutes Intravenous 3 times per day 02/10/12 1610 02/10/12 1755   02/10/12 2200   piperacillin-tazobactam (ZOSYN) IVPB 3.375 g  Status:  Discontinued        3.375 g 12.5 mL/hr over 240 Minutes Intravenous 3 times per day 02/10/12 1834 02/11/12 1130   02/10/12 1400   vancomycin (VANCOCIN) IVPB 1000 mg/200 mL premix  Status:  Discontinued        1,000 mg 200 mL/hr over 60 Minutes Intravenous Every 8 hours 02/10/12 1111 02/10/12 1755   02/10/12 1200   cefTRIAXone (ROCEPHIN) 1 g in dextrose 5 % 50 mL IVPB  Status:  Discontinued        1 g 100 mL/hr over 30 Minutes Intravenous Every 12 hours 02/10/12 1108 02/10/12 1610   02/09/12 1800   vancomycin (VANCOCIN) IVPB 1000 mg/200 mL premix  Status:  Discontinued         1,000 mg 200 mL/hr over 60 Minutes Intravenous Every 12 hours 02/09/12 1318 02/10/12 1111   02/09/12 0600   vancomycin (VANCOCIN) 1,250 mg in sodium chloride 0.9 % 250 mL IVPB  Status:  Discontinued        1,250 mg 166.7 mL/hr over 90 Minutes Intravenous Every 24 hours 02/08/12 1720 02/09/12 1318   02/08/12 2200   piperacillin-tazobactam (ZOSYN) IVPB 3.375 g  Status:  Discontinued        3.375 g 12.5 mL/hr over 240 Minutes Intravenous 3 times per day 02/08/12 1649 02/10/12 1108   02/08/12 2000   oseltamivir (TAMIFLU) capsule 75 mg  Status:  Discontinued        75 mg Oral 2 times daily 02/08/12 1720 02/10/12 1108   02/08/12 1700   vancomycin (VANCOCIN) IVPB 1000 mg/200 mL premix  Status:  Discontinued        1,000 mg 200 mL/hr over 60 Minutes Intravenous Every 12 hours 02/08/12 1649 02/08/12 1720   02/08/12 1530   vancomycin (VANCOCIN) IVPB 1000 mg/200 mL premix        1,000 mg 200 mL/hr over 60 Minutes Intravenous  Once 02/08/12 1527 02/08/12 2000  02/08/12 1530  piperacillin-tazobactam (ZOSYN) IVPB 3.375 g       3.375 g 12.5 mL/hr over 240 Minutes Intravenous  Once 02/08/12 1527 02/08/12 2004          Medications:  Scheduled:   . sodium chloride   Intravenous Once  . ipratropium  0.5 mg Nebulization Q6H   And  . albuterol  2.5 mg Nebulization Q6H  . antiseptic oral rinse  1 application Mouth Rinse QID  . bisacodyl  10 mg Oral Daily  . chlorhexidine  15 mL Mouth Rinse BID  . feeding supplement  30 mL Per Tube QID  . folic acid  1 mg Intravenous Daily  . insulin aspart  2-6 Units Subcutaneous Q4H  . pantoprazole sodium  40 mg Per Tube Daily  . sodium chloride  10-40 mL Intracatheter Q12H  . thiamine  100 mg Intravenous Daily  . vancomycin  1,250 mg Intravenous Q8H    Objective: Vital signs in last 24 hours: Temp:  [101.1 F (38.4 C)-102.3 F (39.1 C)] 101.1 F (38.4 C) (01/23 1610) Pulse Rate:  [93-117] 103  (01/23 0900) Resp:  [14-30] 27  (01/23  0900) BP: (128-170)/(61-68) 128/65 mmHg (01/22 2044) SpO2:  [89 %-98 %] 95 % (01/23 0900) Arterial Line BP: (113-168)/(45-76) 168/76 mmHg (01/23 0900) FiO2 (%):  [30 %-40 %] 30 % (01/23 0900) Weight:  [87.3 kg (192 lb 7.4 oz)] 87.3 kg (192 lb 7.4 oz) (01/23 0438)   General appearance: on vent PS Resp: rhonchi bilaterally Cardio: regular rate and rhythm GI: normal findings: bowel sounds normal and soft, non-tender Extremities: mild anasarca  Lab Results  Basename 02/14/12 0429 02/13/12 0422  WBC 26.8* 27.2*  HGB 8.5* 9.0*  HCT 26.4* 27.0*  NA 145 144  K 4.1 3.9  CL 112 110  CO2 28 26  BUN 21 21  CREATININE 0.75 0.70  GLU -- --   Liver Panel  Basename 02/13/12 0422 02/12/12 0400  PROT 5.9* 5.8*  ALBUMIN 1.5* 1.6*  AST 93* 97*  ALT 67* 68*  ALKPHOS 143* 165*  BILITOT 1.0 1.0  BILIDIR -- --  IBILI -- --   Sedimentation Rate  Basename 02/12/12 0400  ESRSEDRATE 117*   C-Reactive Protein  Basename 02/12/12 0400  CRP 15.2*    Microbiology: Recent Results (from the past 240 hour(s))  TECHNOLOGIST REVIEW     Status: Normal   Collection Time   02/06/12  9:42 AM      Component Value Range Status Comment   Technologist Review Occ Metas and Myelocytes present   Final   CULTURE, BLOOD (ROUTINE X 2)     Status: Normal   Collection Time   02/08/12  3:39 PM      Component Value Range Status Comment   Specimen Description BLOOD RIGHT ANTECUBITAL   Final    Special Requests BOTTLES DRAWN AEROBIC AND ANAEROBIC 5CC   Final    Culture  Setup Time 02/08/2012 23:18   Final    Culture     Final    Value: METHICILLIN RESISTANT STAPHYLOCOCCUS AUREUS     Note: RIFAMPIN AND GENTAMICIN SHOULD NOT BE USED AS SINGLE DRUGS FOR TREATMENT OF STAPH INFECTIONS. This organism DOES NOT demonstrate inducible Clindamycin resistance in vitro. CRITICAL RESULT CALLED TO, READ BACK BY AND VERIFIED WITH: MANDY ROLLS      02/10/12 @ 9:03PM BY RUSCA.     Note: Gram Stain Report Called to,Read Back  By and Verified With: [facility] RICHARDSON 02/09/12 @ 2:26PM  BY RUSCA.   Report Status 02/11/2012 FINAL   Final    Organism ID, Bacteria METHICILLIN RESISTANT STAPHYLOCOCCUS AUREUS   Final   CULTURE, BLOOD (ROUTINE X 2)     Status: Normal   Collection Time   02/08/12  3:49 PM      Component Value Range Status Comment   Specimen Description BLOOD LEFT HAND   Final    Special Requests BOTTLES DRAWN AEROBIC ONLY 2CC   Final    Culture  Setup Time 02/08/2012 23:19   Final    Culture     Final    Value: STAPHYLOCOCCUS AUREUS     Note: SUSCEPTIBILITIES PERFORMED ON PREVIOUS CULTURE WITHIN THE LAST 5 DAYS. CRITICAL RESULT CALLED TO, READ BACK BY AND VERIFIED WITH: MANDY ROLLS 02/10/12 @ 9:03PM BY RUSCA.     Note: Gram Stain Report Called to,Read Back By and Verified With: MEELY RICHARDSON 02/09/12 @ 2:26PM BY RUSCA.   Report Status 02/11/2012 FINAL   Final   URINE CULTURE     Status: Normal   Collection Time   02/08/12  4:58 PM      Component Value Range Status Comment   Specimen Description URINE, CATHETERIZED   Final    Special Requests NONE   Final    Culture  Setup Time 02/09/2012 01:48   Final    Colony Count NO GROWTH   Final    Culture NO GROWTH   Final    Report Status 02/10/2012 FINAL   Final   CULTURE, EXPECTORATED SPUTUM-ASSESSMENT     Status: Normal   Collection Time   02/08/12  5:54 PM      Component Value Range Status Comment   Specimen Description SPUTUM   Final    Special Requests NONE   Final    Sputum evaluation     Final    Value: THIS SPECIMEN IS ACCEPTABLE. RESPIRATORY CULTURE REPORT TO FOLLOW.   Report Status 02/08/2012 FINAL   Final   MRSA PCR SCREENING     Status: Abnormal   Collection Time   02/08/12  5:54 PM      Component Value Range Status Comment   MRSA by PCR POSITIVE (*) NEGATIVE Final   CULTURE, RESPIRATORY     Status: Normal   Collection Time   02/08/12  5:54 PM      Component Value Range Status Comment   Specimen Description SPUTUM   Final    Special  Requests NONE   Final    Gram Stain     Final    Value: ABUNDANT WBC PRESENT,BOTH PMN AND MONONUCLEAR     RARE SQUAMOUS EPITHELIAL CELLS PRESENT     ABUNDANT GRAM POSITIVE COCCI     IN PAIRS IN CLUSTERS   Culture     Final    Value: ABUNDANT METHICILLIN RESISTANT STAPHYLOCOCCUS AUREUS     Note: RIFAMPIN AND GENTAMICIN SHOULD NOT BE USED AS SINGLE DRUGS FOR TREATMENT OF STAPH INFECTIONS. This organism DOES NOT demonstrate inducible Clindamycin resistance in vitro.   Report Status 02/12/2012 FINAL   Final    Organism ID, Bacteria METHICILLIN RESISTANT STAPHYLOCOCCUS AUREUS   Final   CULTURE, BLOOD (ROUTINE X 2)     Status: Normal (Preliminary result)   Collection Time   02/09/12  7:50 PM      Component Value Range Status Comment   Specimen Description Blood   Final    Special Requests NONE   Final    Culture  Setup Time  02/10/2012 02:13   Final    Culture     Final    Value:        BLOOD CULTURE RECEIVED NO GROWTH TO DATE CULTURE WILL BE HELD FOR 5 DAYS BEFORE ISSUING A FINAL NEGATIVE REPORT   Report Status PENDING   Incomplete   CULTURE, BLOOD (ROUTINE X 2)     Status: Normal (Preliminary result)   Collection Time   02/09/12  7:55 PM      Component Value Range Status Comment   Specimen Description Blood   Final    Special Requests NONE   Final    Culture  Setup Time 02/10/2012 02:13   Final    Culture     Final    Value:        BLOOD CULTURE RECEIVED NO GROWTH TO DATE CULTURE WILL BE HELD FOR 5 DAYS BEFORE ISSUING A FINAL NEGATIVE REPORT   Report Status PENDING   Incomplete   ANAEROBIC CULTURE     Status: Normal (Preliminary result)   Collection Time   02/10/12  4:32 PM      Component Value Range Status Comment   Specimen Description ABSCESS LEFT CHEST   Final    Special Requests PATIENT ON FOLLOWING ANCEF VANC   Final    Gram Stain PENDING   Incomplete    Culture     Final    Value: NO ANAEROBES ISOLATED; CULTURE IN PROGRESS FOR 5 DAYS   Report Status PENDING   Incomplete   GRAM  STAIN     Status: Normal   Collection Time   02/10/12  4:32 PM      Component Value Range Status Comment   Specimen Description ABSCESS LEFT CHEST   Final    Special Requests PATIENT ON FOLLOWING ANCEF VANC   Final    Gram Stain     Final    Value: ABUNDANT WBC PRESENT, PREDOMINANTLY PMN     ABUNDANT GRAM POSITIVE COCCI IN CLUSTERS     Gram Stain Report Called to,Read Back By and Verified With: P.WEATHERLY,RN 02/10/12 1725 EHOWARD   Report Status 02/10/2012 FINAL   Final   CULTURE, ROUTINE-ABSCESS     Status: Normal   Collection Time   02/10/12  4:34 PM      Component Value Range Status Comment   Specimen Description ABSCESS LEFT CHEST   Final    Special Requests PATIENT ON FOLLOWING VANC ANCEF   Final    Gram Stain     Final    Value: ABUNDANT WBC PRESENT, PREDOMINANTLY PMN     NO SQUAMOUS EPITHELIAL CELLS SEEN     ABUNDANT GRAM POSITIVE COCCI IN CLUSTERS     Gram Stain Report Called to,Read Back By and Verified With: Gram Stain Report Called to,Read Back By and Verified With: P WEATHERLY RN 02/10/12 1725 BY ZOXWRUE Performed at [facility]   Culture     Final    Value: ABUNDANT METHICILLIN RESISTANT STAPHYLOCOCCUS AUREUS     Note: RIFAMPIN AND GENTAMICIN SHOULD NOT BE USED AS SINGLE DRUGS FOR TREATMENT OF STAPH INFECTIONS. This organism DOES NOT demonstrate inducible Clindamycin resistance in vitro. CRITICAL RESULT CALLED TO, READ BACK BY AND VERIFIED WITH: SUSAN F 1/22 @      820 BY REAMM   Report Status 02/13/2012 FINAL   Final    Organism ID, Bacteria METHICILLIN RESISTANT STAPHYLOCOCCUS AUREUS   Final     Studies/Results: Dg Chest Port 1 View  02/14/2012  *  RADIOLOGY REPORT*  Clinical Data: Postop from left lung and chest wall abscess.  PORTABLE CHEST - 1 VIEW  Comparison: 02/13/2012  Findings: Support apparatus including left chest tube remain in appropriate position.  No pneumothorax identified.  Asymmetric airspace disease is again seen involving left lung greater than  right, without significant change.  Heart size is normal.  IMPRESSION: No significant change in asymmetric bilateral airspace disease.  No pneumothorax identified.   Original Report Authenticated By: Myles Rosenthal, M.D.    Dg Chest Port 1 View  02/13/2012  *RADIOLOGY REPORT*  Clinical Data: Chest tube, follow-up  PORTABLE CHEST - 1 VIEW  Comparison: Portable exam 0649 hours compared to 02/12/2012  Findings: Tip of endotracheal tube 5.7 cm above carina. Nasogastric tube extends into stomach. Right jugular central venous catheter tip projecting over SVC. Left basilar thoracostomy tube. Stable heart size and mediastinal contours. Patchy infiltrates identified throughout left lung and at right base. No gross pleural effusion or pneumothorax identified.  IMPRESSION: No interval change.   Original Report Authenticated By: Ulyses Southward, M.D.      Assessment/Plan: Paralysis MRSA Bacteremia, Chest Wall abscess DVT subclavian and axillary vein D6 vanco  Still febrile, WBC elevated To get MRI of neck and head today due to new paralysis. Could be due to epidural abscess in neck, CNS bleed (after heparin for DVT), spinal cord infarct, critical illness myopathy less likely (not on paralytics, rapid onset)  Pending TEE, neuro eval, possible neurosurgery eval depending on MRI result.   discussed with Dr Delton Coombes and Dr Tilman Neat Infectious Diseases [phone] 02/14/2012, 10:24 AM   LOS: 6 days

## 2012-02-14 NOTE — Progress Notes (Signed)
Pt placed back on full support due to increased agitation.

## 2012-02-14 NOTE — Progress Notes (Signed)
PCCM Progress Note  Pt profile: 30M with recently discovered LUL/chest wall mass, admitted 1/17 with severe chest wall pain and hypoxemia, suspected PNA.  Further history obtained 1/19: Pt developed  Small lesion on middle finger of L hand around 1/03. Developed severe upper L chest pain 1/08 and seen by primary MD 1/09. CXR normal @ that time. 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 Seen by Onc 1/15 and plans for eval of presumed lung cancer. Admitted via ED 1/17 with severe chest pain, hypoxemia, presumed PNA and continued presumption of LUL malignancy. Blood cultures drawn, abx started. Blood cx's turned positive on 1/18.   Studies/Events: 1/13 CT chest: Left upper lobe soft tissue mass anteriorly abutting the pleura  of 7.3 x 4.4 x 5.0 cm. There does appear to be surrounding infiltrate. This is worrisome for primary lung carcinoma. A portion of this soft tissue mass extends through the left anterior chest wall and involves the left pectoralis musculature. Multiple bilateral nodules worrisome for mets 1/19 concern for chest wall abscess rather than malignancy. Transferred to [facility] for TCTS eval 1/19 Irrigation and debridement L chest wall abscess, mini-thoracotomy, placement of VAC and L chest tube Tyrone Sage) 1/20 TTE >> normal LV fxn but grade 2 diastolic dysfxn, mild L atrial dilation, poor view of AV, no evidence MR  Lines, Tubes, etc: ETT 1/19 >>  L IJ CVL 1/19 >>  R radial A-line 1/19 >>   Microbiology: MRSA PCR 1/17 >> POSITIVE Resp 1/17 >> MRSA Urine 1/17 >> negative Blood 1/17 >> 2/2  MRSA  Blood 1/18 >>  Abscess 1/19 >> MRSA  Antibiotics:  Osletamivir 1/17 >> 1/19 Zosyn 1/17 >> 1/20 Vanc 1/17 >>   Consults:  TCTS ID  Best Practice: DVT: LMWH 1/18 SUP: N/I Nutrition: Reg diet Glycemic control: N/I  Sedation/analgesia: propofol, fentanyl  Subj: Not agitated Heparin started 1/22 pm  for L UE DVT in region of his L chest wall abscess Important neuro change noted this am >> unable to move B UE and LE  Obj: Filed Vitals:   02/14/12 0900  BP:   Pulse: 103  Temp:   Resp: 27    Intake/Output Summary (Last 24 hours) at 02/14/12 1007 Last data filed at 02/14/12 0900  Gross per 24 hour  Intake 2981.09 ml  Output   2246 ml  Net 735.09 ml   Gen: chronically ill appearing HEENT: WNL Neck: Fullness in L side of neck and supraclav region. No discrete nodes Chest: L upper chest wound,  Diffuse scattered rhonchi Cardiac: tachy, reg, no M Abd: soft, NT, NABS Ext: warm, no edema, erythematous L middle finger with mild L forearm erythema Neuro: R 4mm, L 3mm and react, unable to move either UE or LE, sensation is intact. Moves head and neck, clearly follows commands  BMET  Lab 02/14/12 0429 02/13/12 0422 02/12/12 0400 02/11/12 0359 02/10/12 1900 02/09/12 0335  NA 145 144 138 132* 132* --  K 4.1 3.9 -- -- -- --  CL 112 110 104 100 100 --  CO2 28 26 24 24 25  --  GLUCOSE 148* 125* 116* 121* 135* --  BUN 21 21 21  29* 26* --  CREATININE 0.75 0.70 0.78 0.89 0.74 --  CALCIUM 8.1* 8.3* 8.2* 7.7* 7.6* --  MG -- -- 2.1 -- -- 2.5  PHOS -- -- 2.9 -- -- 4.1    CBC  Lab 02/14/12 0429 02/13/12 0422 02/12/12  0400  HGB 8.5* 9.0* 9.3*  HCT 26.4* 27.0* 28.4*  WBC 26.8* 27.2* 26.3*  PLT 477* 384 332    Lab 02/13/12 0422 02/11/12 0359 02/09/12 0335  PROCALCITON 0.75 2.81 9.36    CXR: 1/23 LUL opacity resolving, stable B AS dz, L chest tube  IMPRESSION: Patient Active Problem List  Diagnosis  . H/O HTN  . H/O hypercholesteremia  . H/O stroke  . Acute respiratory failure  . Hypoxemia  . Pain, chest wall  . Chest wall abscess  . Lung abscess  . Staphylococcus aureus bacteremia with sepsis  . Status post thoracotomy, 1/19 Tyrone Sage  . Paraplegia  . DVT of upper extremity (deep vein thrombosis)    PLAN/RECS:  - etiology of acute paraplegia unclear, but very  concerning for possible paraspinal abscess. Consider also critical illness polymyopathy, but he is otherwise strong (neck, etc) >> STAT MRI of brian and spine. He has dental implants, will confirm that he can get the MRI - LUE DVT noted 1/22 pm and heparin started. Will d/c the heparin for now given the possibility of a paraspinal bleed as cause for paraplegia - Follow CXR - resolution of L upper opacity and findings at surgery suggest abscess was in pleural space, not the LUL. Continues to drain adequately. Plan is for further debridement/dressing changes at bedside, next 1/23  - Vanco per ID recs - Blood cx from 1/18 (on abx) pending (negative so far). If positive then will change lines. NO PICC placement until we confirm cx's have gone negative - ? Timing for repeat imaging, CT scan. Will discuss with Dr Tyrone Sage, depends on clinical course and CXR appearance - TEE to better eval for possible endocarditis as this will affect duration of abx therapy > probably 1/23 - Begin WUA's and SBT's, no extubation until acute issues stabilize - Propofol for sedation - Note he will likely be at risk for EtOH withdrawal when we move to extubation; thiamine, folate - Acute renal failure resolved with volume resuscitation - follow BMP  45 minutes CC time   Levy Pupa, MD, PhD 02/14/2012, 10:07 AM [facility] [phone] or if no answer [phone]

## 2012-02-14 NOTE — Anesthesia Preprocedure Evaluation (Signed)
Anesthesia Evaluation  Patient identified by MRN, date of birth, ID bandGeneral Assessment Comment:Pt sedated  Reviewed: Allergy & Precautions, H&P , NPO status , Patient's Chart, lab work & pertinent test results, reviewed documented beta blocker date and time , Unable to perform ROS - Chart review only  Airway       Dental   Intubated:   Pulmonary Current Smoker,          Cardiovascular hypertension, Pt. on medications + Peripheral Vascular Disease     Neuro/Psych Cervical Epidural abscess CVA    GI/Hepatic Heavy ETOH use   Endo/Other    Renal/GU      Musculoskeletal   Abdominal   Peds  Hematology   Anesthesia Other Findings   Reproductive/Obstetrics                           Anesthesia Physical Anesthesia Plan  ASA: IV and emergent  Anesthesia Plan: General   Post-op Pain Management:    Induction: Intravenous  Airway Management Planned: Oral ETT  Additional Equipment: Arterial line  Intra-op Plan:   Post-operative Plan: Post-operative intubation/ventilation  Informed Consent: I have reviewed the patients History and Physical, chart, labs and discussed the procedure including the risks, benefits and alternatives for the proposed anesthesia with the patient or authorized representative who has indicated his/her understanding and acceptance.     Plan Discussed with: CRNA, Anesthesiologist and Surgeon  Anesthesia Plan Comments:         Anesthesia Quick Evaluation

## 2012-02-14 NOTE — Progress Notes (Signed)
ANTICOAGULATION CONSULT NOTE - Initial Consult  Pharmacy Consult for heparin Indication: DVT  No Known Allergies  Patient Measurements: Height: 5\' 10"  (177.8 cm) Weight: 186 lb 4.6 oz (84.5 kg) (bed weight) IBW/kg (Calculated) : 73  Heparin Dosing Weight: 85 kg   Vital Signs: Temp: 102.1 F (38.9 C) (01/22 2329) Temp src: Oral (01/22 2329) BP: 128/65 mmHg (01/22 2044) Pulse Rate: 105  (01/22 2329)  Labs:  Basename 02/13/12 0422 02/12/12 0400 02/11/12 0359  HGB 9.0* 9.3* --  HCT 27.0* 28.4* 26.9*  PLT 384 332 219  APTT -- -- --  LABPROT -- -- --  INR -- -- --  HEPARINUNFRC -- -- --  CREATININE 0.70 0.78 0.89  CKTOTAL -- -- --  CKMB -- -- --  TROPONINI -- -- --    Estimated Creatinine Clearance: 114.1 ml/min (by C-G formula based on Cr of 0.7).   Medical History: Past Medical History  Diagnosis Date  . Hypercholesteremia 02/06/2012  . Stroke 02/06/2012    09/13 secondary to hypertensive crisis.  . Lung cancer     Medications:  Scheduled:    . sodium chloride   Intravenous Once  . ipratropium  0.5 mg Nebulization Q6H   And  . albuterol  2.5 mg Nebulization Q6H  . antiseptic oral rinse  1 application Mouth Rinse QID  . bisacodyl  10 mg Oral Daily  . chlorhexidine  15 mL Mouth Rinse BID  . Chlorhexidine Gluconate Cloth  6 each Topical Q0600  . feeding supplement  30 mL Per Tube QID  . folic acid  1 mg Intravenous Daily  . insulin aspart  2-6 Units Subcutaneous Q4H  . [COMPLETED] mupirocin ointment  1 application Nasal BID  . pantoprazole (PROTONIX) IV  40 mg Intravenous Q24H  . sodium chloride  10-40 mL Intracatheter Q12H  . thiamine  100 mg Intravenous Daily  . vancomycin  1,250 mg Intravenous Q8H    Assessment: 52 yo male known to pharmacy from vancomycin management. Pharmacy now to manage IV heparin due to preliminary report of DVT in left subclavian and axillary vein. Patient with recent OR visits (1/19 and 1/20)  for L chest debridement, wound  debridement, VAC placement, and mini thoracotomy. No bleeding per RN.   Goal of Therapy:  Heparin level 0.3-0.7 units/ml Monitor platelets by anticoagulation protocol: Yes   Plan:  1. Heparin IV infusion of 1450 units/hr.  2. Heparin level in 6 hours.  3. Daily CBC, heparin level.  Emeline Gins 02/14/2012,1:31 AM

## 2012-02-14 NOTE — Op Note (Signed)
02/08/2012 - 02/14/2012  6:27 PM  PATIENT:  Frank Brock  51 y.o. male  PRE-OPERATIVE DIAGNOSIS:  Cervical pidural abscess with acute quadriplegia  POST-OPERATIVE DIAGNOSIS: Cervical pidural abscess with acute quadriplegia  PROCEDURE:  Procedure(s) (LRB) with comments: POSTERIOR CERVICAL LAMINECTOMY FOR EPIDURAL ABSCESS (N/A) - Cervical Laminectomy  for Epidural Abscess C 3- C 7  SURGEON:  Surgeon(s) and Role:    * Costantino Kohlbeck, MD - Primary  PHYSICIAN ASSISTANT:   ASSISTANTS: Poteat, RN   ANESTHESIA:   general  EBL:  Total I/O In: 1595.2 [I.V.:1595.2] Out: 1025 [Urine:1025]  BLOOD ADMINISTERED:none  DRAINS: (Medium) Hemovact drain(s) in the epidural space with  Suction Open   LOCAL MEDICATIONS USED:  LIDOCAINE   SPECIMEN:  Excision  DISPOSITION OF SPECIMEN:  PATHOLOGY  COUNTS:  YES  TOURNIQUET:  * No tourniquets in log *  DICTATION: DICTATION: Patient is 51 year old man with acute quadriplegia with MRSA sepsis with MRI showing cervical cord compression and edema.  It was elected to take the patient to surgery for posterior cervical decompression on an emergent basis. He is on the ventilator with chest tube and Vac dressing for chest infection and is being treated for MRSA sepsis and septic shock.  PROCEDURE: Patient was brought to the OR and after induction of anesthesia,  patient was placed in 3 pin fixation and rolled into a prone position on the OR table in the cervical collar. Posterior neck was shaved with clippers, prepped and draped in the usual fashion with betadine scrub followed by Duraprep.  Area of planned incision was infiltrated with local lidocaine.  Incision was made from C2-C7 and carried through the avascular midline plane to expose these lavels and their lateral masses.  An intraoperative Xray was obtained with clips at C2 and C3.  The spinous processes of C3-C6 were then removed and the laminae were thinned with a Leksell rongeur. A total laminectomy  was performed from the C2 lamina all the way to the top of C7 and the foraminae were also decompressed at each level. The spinal cord dura appeared to be pulsatile and well decompressed.  There were areas of granulation and fat necrosis with thrombosed epidural veins and acute inflammation without frank pus.  Samples were sent to Pathology for evaluation along with swabs for aerobic and anaerobic culture. Hemostasis was assured and a medium Hemovac drain was placed in the epidural space. Wound was closed with 0, 2-0, and 3-0 vicryl sutures. Sterile occlusive dressing was placed with benzoin, steristrips and Telfa, gauze and tape.  Patient was taken out of pins and turned back onto the OR table.  Patient was taken to recovery in stable and satisfactory condition still intubated.  Counts were correct at the end of the case.  PLAN OF CARE: Admit to inpatient   PATIENT DISPOSITION:  PACU - hemodynamically stable.   Delay start of Pharmacological VTE agent (>24hrs) due to surgical blood loss or risk of bleeding: yes  

## 2012-02-14 NOTE — Progress Notes (Signed)
Pt back from MRI and placed back on vent.  RT will continue to monitor.

## 2012-02-14 NOTE — Progress Notes (Signed)
  Echocardiogram Echocardiogram Transesophageal has been performed.  Georgian Co 02/14/2012, 3:48 PM

## 2012-02-14 NOTE — Progress Notes (Addendum)
3 Days Post-Op Procedure(s) (LRB): CHEST EXPLORATION (Left) MINOR APPLICATION OF WOUND VAC (Left) Subjective:  Opens eyes to name, doesn't follow commands  Objective: Vital signs in last 24 hours: Temp:  [101.1 F (38.4 C)-102.3 F (39.1 C)] 101.1 F (38.4 C) (01/23 0821) Pulse Rate:  [93-117] 93  (01/23 0841) Cardiac Rhythm:  [-] Normal sinus rhythm (01/23 0400) Resp:  [14-30] 19  (01/23 0841) BP: (128-170)/(61-68) 128/65 mmHg (01/22 2044) SpO2:  [89 %-98 %] 95 % (01/23 0841) Arterial Line BP: (113-158)/(45-63) 128/52 mmHg (01/23 0700) FiO2 (%):  [30 %-40 %] 30 % (01/23 0841) Weight:  [192 lb 7.4 oz (87.3 kg)] 192 lb 7.4 oz (87.3 kg) (01/23 0438)  Intake/Output from previous day: 01/22 0701 - 01/23 0700 In: 3160.5 [I.V.:1778.5; NG/GT:615; IV Piggyback:767] Out: 2296 [Urine:2135; Drains:130; Chest Tube:31]  General appearance: sedated on vetn Heart: regular rate and rhythm Lungs: diminished breath sounds bibasilar Wound: wound vac in place  Lab Results:  [facility] 02/14/12 0429 02/13/12 0422  WBC 26.8* 27.2*  HGB 8.5* 9.0*  HCT 26.4* 27.0*  PLT 477* 384   BMET:  Basename 02/14/12 0429 02/13/12 0422  NA 145 144  K 4.1 3.9  CL 112 110  CO2 28 26  GLUCOSE 148* 125*  BUN 21 21  CREATININE 0.75 0.70  CALCIUM 8.1* 8.3*    PT/INR: No results found for this basename: LABPROT,INR in the last 72 hours ABG    Component Value Date/Time   PHART 7.390 02/12/2012 0427   HCO3 24.2* 02/12/2012 0427   TCO2 25 02/12/2012 0427   ACIDBASEDEF 1.0 02/10/2012 1945   O2SAT 96.0 02/12/2012 0427   CBG (last 3)   Basename 02/14/12 0819 02/14/12 0418 02/13/12 2330  GLUCAP 116* 132* 120*    Assessment/Plan: S/P Procedure(s) (LRB): CHEST EXPLORATION (Left) MINOR APPLICATION OF WOUND VAC (Left)  1. Remains sedated on vent, management per CCM 2. Wound vac- changed yesterday by wound care due to leak causing improper seal 3. Continue tube feeds 4. LUE DVT- on heparin 5. 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 Change wound vac Friday Today patient more alert moves head and eyes to command but flaccid in arm and legs I have seen and examined Frank Brock and agree with the above assessment  and plan. Will have Neurology see, question metastatic spread of infection to other sites  Delight Ovens MD Beeper [phone] Office [phone] 02/14/2012 9:08 AM

## 2012-02-14 NOTE — Consult Note (Signed)
NEURO HOSPITALIST CONSULT NOTE    Reason for Consult: quadraplegia  HPI:                                                                                                                                            Much of history obtained from chart.   Frank Brock is an 52 y.o. male with hx of prior CVA, HTN hyperlipidemia, smoking developed Small lesion on middle finger of L hand around 1/03. Developed severe upper L chest pain 1/08 and seen by primary MD 1/09. CXR normal @ that time. 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 Seen by Onc 1/15 and plans for eval of presumed lung cancer. Admitted via ED 1/17 with severe chest pain, hypoxemia, presumed PNA and continued presumption of LUL malignancy. Blood cultures drawn, abx started. Blood cx's turned positive on 1/18 ultimately growing MRSA in 2/2. In the interim he had deteriorated and CT showed suspected to was in fact a Large left-sided chest wall abscess communicates with the adjacent intrathoracic/lung abscess. He was transported emergently them at [facility] to [facility] for emergent cardiothoracic surgery and yesterday underwent prior thoracic surgery with exploration of chest wound, minithoracotomy evacuation of chest wall abscess and intrapleural abscess with placement of chest tube and a wound vacuum dressing. 1/20 TEE showed diastolic dysfunction but no thrombus, 1/22 left upper extremity doppler showed both subclavian and axillary vein DVT--patient was started on heparin which was subsequently stopped after patient was noted to be quadriplegic when sedation weaned off. Neurology was asked to see patient for further evaluation of bilateral quadriplegia.   Currently patient is intubated, BP 197/98, HR 115, breathing over vent.    Past Medical History  Diagnosis Date  . Hypercholesteremia 02/06/2012  . Stroke 02/06/2012    09/13  secondary to hypertensive crisis.  . Lung cancer     Past Surgical History  Procedure Date  . Inguinal hernia repair   . Irrigation and debridement abscess 02/10/2012    Procedure: IRRIGATION AND DEBRIDEMENT ABSCESS;  Surgeon: Delight Ovens, MD;  Location: [facility] OR;  Service: Thoracic;  Laterality: Left;  . Thoracotomy 02/10/2012    Procedure: MINI/LIMITED THORACOTOMY;  Surgeon: Delight Ovens, MD;  Location: [facility] OR;  Service: Thoracic;  Laterality: Left;  . Chest exploration 02/11/2012    Procedure: CHEST EXPLORATION;  Surgeon: Delight Ovens, MD;  Location: [facility] OR;  Service: Thoracic;  Laterality: Left;  re-exploration of left chest wall; with wound vac change  . Minor application of wound vac 02/11/2012    Procedure: MINOR APPLICATION OF WOUND VAC;  Surgeon: Delight Ovens, MD;  Location: [facility] OR;  Service: Thoracic;  Laterality: Left;  wound  vac change    Family History  Problem Relation Age of Onset  . Hypothyroidism Mother     Social History:  reports that he has been smoking.  He has never used smokeless tobacco. He reports that he drinks about 1.8 ounces of alcohol per week. He reports that he does not use illicit drugs.  No Known Allergies  MEDICATIONS:                                                                                                                     Scheduled:   . sodium chloride   Intravenous Once  . ipratropium  0.5 mg Nebulization Q6H   And  . albuterol  2.5 mg Nebulization Q6H  . antiseptic oral rinse  1 application Mouth Rinse QID  . bisacodyl  10 mg Oral Daily  . chlorhexidine  15 mL Mouth Rinse BID  . feeding supplement  30 mL Per Tube QID  . folic acid  1 mg Intravenous Daily  . insulin aspart  2-6 Units Subcutaneous Q4H  . pantoprazole sodium  40 mg Per Tube Daily  . sodium chloride  10-40 mL Intracatheter Q12H  . thiamine  100 mg Intravenous Daily  . vancomycin  1,250 mg Intravenous Q8H     ROS:                                                                                                                                        History obtained from unobtainable from patient due tointubation    Blood pressure 128/65, pulse 103, temperature 101.1 F (38.4 C), temperature source Axillary, resp. rate 27, height 5\' 10"  (1.778 m), weight 87.3 kg (192 lb 7.4 oz), SpO2 95.00%.   Neurologic Examination:                                                                                                      Mental Status: Alert, intubated, will nod head yes and no to some questions appropriately  but other times will only wrinkle forehead.   Cranial Nerves: II: Discs flat bilaterally; Visual fields difficult to asess-he appears to blink to threat on the left but not the right (this was inconsistent), pupils equal, round, reactive to light and accommodation III,IV, VI: ptosis on the right, does not clearly look to fullest extent in any direction, potentially prefers leftward gaze, no vertical moments noted.  V,VII: Face appears symmetric but difficult to asess with intubation, facial light touch sensation normal bilaterally VIII: hearing normal bilaterally IX,X: gag reflex present XI: bilateral shoulder shrug XII: Unable to asses Motor: Flaccid in all 4 extremities.  Sensory: No response to pain in all 4 extremities however he did seem to shrug his shoulder to nailbed pressure on the right.  On prior exam he nodded his head "yes"when I provided light touch to all 4 extremities.  Deep Tendon Reflexes: 2+ and symmetric throughout Plantars: Right: downgoing   Left: downgoing Cerebellar: CV: pulses palpable throughout     No results found for this basename: cbc, bmp, coags, chol, tri, ldl, hga1c    Results for orders placed during the hospital encounter of 02/08/12 (from the past 48 hour(s))  COMPREHENSIVE METABOLIC PANEL     Status: Abnormal   Collection Time   02/13/12  4:22 AM      Component Value Range Comment   Sodium 144   135 - 145 mEq/L    Potassium 3.9  3.5 - 5.1 mEq/L    Chloride 110  96 - 112 mEq/L    CO2 26  19 - 32 mEq/L    Glucose, Bld 125 (*) 70 - 99 mg/dL    BUN 21  6 - 23 mg/dL    Creatinine, Ser 1.61  0.50 - 1.35 mg/dL    Calcium 8.3 (*) 8.4 - 10.5 mg/dL    Total Protein 5.9 (*) 6.0 - 8.3 g/dL    Albumin 1.5 (*) 3.5 - 5.2 g/dL    AST 93 (*) 0 - 37 U/L    ALT 67 (*) 0 - 53 U/L    Alkaline Phosphatase 143 (*) 39 - 117 U/L    Total Bilirubin 1.0  0.3 - 1.2 mg/dL    GFR calc non Af Amer >90  >90 mL/min    GFR calc Af Amer >90  >90 mL/min   CBC     Status: Abnormal   Collection Time   02/13/12  4:22 AM      Component Value Range Comment   WBC 27.2 (*) 4.0 - 10.5 K/uL    RBC 2.96 (*) 4.22 - 5.81 MIL/uL    Hemoglobin 9.0 (*) 13.0 - 17.0 g/dL    HCT 09.6 (*) 04.5 - 52.0 %    MCV 91.2  78.0 - 100.0 fL    MCH 30.4  26.0 - 34.0 pg    MCHC 33.3  30.0 - 36.0 g/dL    RDW 40.9 (*) 81.1 - 15.5 %    Platelets 384  150 - 400 K/uL   PROCALCITONIN     Status: Normal   Collection Time   02/13/12  4:22 AM      Component Value Range Comment   Procalcitonin 0.75     GLUCOSE, CAPILLARY     Status: Abnormal   Collection Time   02/13/12 12:27 PM      Component Value Range Comment   Glucose-Capillary 133 (*) 70 - 99 mg/dL    Comment 1 Documented in Chart  Comment 2 Notify RN     GLUCOSE, CAPILLARY     Status: Abnormal   Collection Time   02/13/12  5:14 PM      Component Value Range Comment   Glucose-Capillary 123 (*) 70 - 99 mg/dL    Comment 1 Documented in Chart      Comment 2 Notify RN     GLUCOSE, CAPILLARY     Status: Abnormal   Collection Time   02/13/12  8:09 PM      Component Value Range Comment   Glucose-Capillary 156 (*) 70 - 99 mg/dL    Comment 1 Documented in Chart      Comment 2 Notify RN     GLUCOSE, CAPILLARY     Status: Abnormal   Collection Time   02/13/12 11:30 PM      Component Value Range Comment   Glucose-Capillary 120 (*) 70 - 99 mg/dL    Comment 1 Documented in Chart        Comment 2 Notify RN     GLUCOSE, CAPILLARY     Status: Abnormal   Collection Time   02/14/12  4:18 AM      Component Value Range Comment   Glucose-Capillary 132 (*) 70 - 99 mg/dL   BASIC METABOLIC PANEL     Status: Abnormal   Collection Time   02/14/12  4:29 AM      Component Value Range Comment   Sodium 145  135 - 145 mEq/L    Potassium 4.1  3.5 - 5.1 mEq/L    Chloride 112  96 - 112 mEq/L    CO2 28  19 - 32 mEq/L    Glucose, Bld 148 (*) 70 - 99 mg/dL    BUN 21  6 - 23 mg/dL    Creatinine, Ser 1.61  0.50 - 1.35 mg/dL    Calcium 8.1 (*) 8.4 - 10.5 mg/dL    GFR calc non Af Amer >90  >90 mL/min    GFR calc Af Amer >90  >90 mL/min   CBC     Status: Abnormal   Collection Time   02/14/12  4:29 AM      Component Value Range Comment   WBC 26.8 (*) 4.0 - 10.5 K/uL    RBC 2.84 (*) 4.22 - 5.81 MIL/uL    Hemoglobin 8.5 (*) 13.0 - 17.0 g/dL    HCT 09.6 (*) 04.5 - 52.0 %    MCV 93.0  78.0 - 100.0 fL    MCH 29.9  26.0 - 34.0 pg    MCHC 32.2  30.0 - 36.0 g/dL    RDW 40.9 (*) 81.1 - 15.5 %    Platelets 477 (*) 150 - 400 K/uL   GLUCOSE, CAPILLARY     Status: Abnormal   Collection Time   02/14/12  8:19 AM      Component Value Range Comment   Glucose-Capillary 116 (*) 70 - 99 mg/dL    Comment 1 Documented in Chart      Comment 2 Notify RN     HEPARIN LEVEL (UNFRACTIONATED)     Status: Abnormal   Collection Time   02/14/12  8:30 AM      Component Value Range Comment   Heparin Unfractionated <0.10 (*) 0.30 - 0.70 IU/mL     Dg Chest Port 1 View  02/14/2012  *RADIOLOGY REPORT*  Clinical Data: Postop from left lung and chest wall abscess.  PORTABLE CHEST - 1 VIEW  Comparison: 02/13/2012  Findings:  Support apparatus including left chest tube remain in appropriate position.  No pneumothorax identified.  Asymmetric airspace disease is again seen involving left lung greater than right, without significant change.  Heart size is normal.  IMPRESSION: No significant change in asymmetric bilateral  airspace disease.  No pneumothorax identified.   Original Report Authenticated By: Myles Rosenthal, M.D.    Dg Chest Port 1 View  02/13/2012  *RADIOLOGY REPORT*  Clinical Data: Chest tube, follow-up  PORTABLE CHEST - 1 VIEW  Comparison: Portable exam 0649 hours compared to 02/12/2012  Findings: Tip of endotracheal tube 5.7 cm above carina. Nasogastric tube extends into stomach. Right jugular central venous catheter tip projecting over SVC. Left basilar thoracostomy tube. Stable heart size and mediastinal contours. Patchy infiltrates identified throughout left lung and at right base. No gross pleural effusion or pneumothorax identified.  IMPRESSION: No interval change.   Original Report Authenticated By: Ulyses Southward, M.D.      Assessment/Plan:  52 YO male hospitalized for Staphylococcus aureus bacteremia with sepsis, Lung abscess, DVT of upper extremity (deep vein thrombosis) now presenting with quadriplegia after weaning from sedation 02/14/12. Etiology is unclear at this time but differential includes: transverse myelitis, central cord abscess, embolic shower from infective source.  Plan:  1) Obtain MRI of full neuro axis with and without contrast--will make further recommendations after MRI 2) ID and PCCM following present infective etiologies  Felicie Morn PA-C [facility] [phone]  02/14/2012, 11:12 AM     I have seen and evaluated the patient. I have reviewed the above note and made appropriate changes.  I suspect C-spine pathology at this point, if a compressive lesion is seen(abcess, hematoma), then neurosurgical consultation should be obtained.   Ritta Slot, MD [facility] [phone]  If 7pm- 7am, please page neurology on call at [phone].

## 2012-02-14 NOTE — Consult Note (Signed)
Reason for Consult: quadraplegia  HPI:  Much of history obtained from chart.  Frank Brock is an 52 y.o. male with hx of prior CVA, HTN hyperlipidemia, smoking developed Small lesion on middle finger of L hand around 1/03. Developed severe upper L chest pain 1/08 and seen by primary MD 1/09. CXR normal @ that time. 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 Seen by Onc 1/15 and plans for eval of presumed lung cancer. Admitted via ED 1/17 with severe chest pain, hypoxemia, presumed PNA and continued presumption of LUL malignancy. Blood cultures drawn, abx started. Blood cx's turned positive on 1/18 ultimately growing MRSA in 2/2. In the interim he had deteriorated and CT showed suspected to was in fact a Large left-sided chest wall abscess communicates with the adjacent intrathoracic/lung abscess. He was transported emergently them at [facility] to [facility] for emergent cardiothoracic surgery and yesterday underwent prior thoracic surgery with exploration of chest wound, minithoracotomy evacuation of chest wall abscess and intrapleural abscess with placement of chest tube and a wound vacuum dressing. 1/20 TEE showed diastolic dysfunction but no thrombus, 1/22 left upper extremity doppler showed both subclavian and axillary vein DVT--patient was started on heparin which was subsequently stopped after patient was noted to be quadriplegic when sedation weaned off. Neurology was asked to see patient for further evaluation of bilateral quadriplegia. I was then consulted on an emergent basis by Neurology because of epidural compressive lesion in the cervical spine with cord edema.  I reviewed the studies with patient's wife and mother and also with Dr. Constance Goltz.  The patient has acute quadriplegia.  We cannot determine the degree of cord compression versus infarct and Dr. Constance Goltz is under the impression that the patient has ongoing  significant cord compression.  The concern is that the patient's one chance for neurologic improvement is acute decompression, but I have indicated to them that my expectations for marked improvement are not great.  Given the rapid progression to quadriplegia, otherwise healthy state, devastating nature of this cord injury and limited chances for improvement, they wish to proceed with surgery.  They understand the risk that he may not improve and also understand that he may require surgical stabilization of his cervical spine, given his pre-existing kyphosis, but I do not think that posterior instrumentation is appropriate at this time. Currently patient is intubated, BP 197/98, HR 115, breathing over vent.  Past Medical History   Diagnosis  Date   .  Hypercholesteremia  02/06/2012   .  Stroke  02/06/2012     09/13 secondary to hypertensive crisis.   .  Lung cancer     Past Surgical History   Procedure  Date   .  Inguinal hernia repair    .  Irrigation and debridement abscess  02/10/2012     Procedure: IRRIGATION AND DEBRIDEMENT ABSCESS; Surgeon: Delight Ovens, MD; Location: [facility] OR; Service: Thoracic; Laterality: Left;   .  Thoracotomy  02/10/2012     Procedure: MINI/LIMITED THORACOTOMY; Surgeon: Delight Ovens, MD; Location: [facility] OR; Service: Thoracic; Laterality: Left;   .  Chest exploration  02/11/2012     Procedure: CHEST EXPLORATION; Surgeon: Delight Ovens, MD; Location: [facility] OR; Service: Thoracic; Laterality: Left; re-exploration of left chest wall; with wound vac change   .  Minor application of wound vac  02/11/2012     Procedure: MINOR APPLICATION OF WOUND VAC; Surgeon: Delight Ovens,  MD; Location: [facility] OR; Service: Thoracic; Laterality: Left; wound vac change    Family History   Problem  Relation  Age of Onset   .  Hypothyroidism  Mother     Social History: reports that he has been smoking. He has never used smokeless tobacco. He reports that he drinks about 1.8 ounces of alcohol  per week. He reports that he does not use illicit drugs.  No Known Allergies  MEDICATIONS:  Scheduled:  .  sodium chloride   Intravenous  Once   .  ipratropium  0.5 mg  Nebulization  Q6H    And   .  albuterol  2.5 mg  Nebulization  Q6H   .  antiseptic oral rinse  1 application  Mouth Rinse  QID   .  bisacodyl  10 mg  Oral  Daily   .  chlorhexidine  15 mL  Mouth Rinse  BID   .  feeding supplement  30 mL  Per Tube  QID   .  folic acid  1 mg  Intravenous  Daily   .  insulin aspart  2-6 Units  Subcutaneous  Q4H   .  pantoprazole sodium  40 mg  Per Tube  Daily   .  sodium chloride  10-40 mL  Intracatheter  Q12H   .  thiamine  100 mg  Intravenous  Daily   .  vancomycin  1,250 mg  Intravenous  Q8H    ROS:  History obtained from unobtainable from patient due tointubation  Blood pressure 128/65, pulse 103, temperature 101.1 F (38.4 C), temperature source Axillary, resp. rate 27, height 5\' 10"  (1.778 m), weight 87.3 kg (192 lb 7.4 oz), SpO2 95.00%.  Neurologic Examination:  Mental Status:  Alert, intubated, will nod head yes and no to some questions appropriately but other times will only wrinkle forehead.  Cranial Nerves:  II: Discs flat bilaterally; Visual fields difficult to asess-he appears to blink to threat on the left but not the right (this was inconsistent), pupils equal, round, reactive to light and accommodation  III,IV, VI: ptosis on the right, does not clearly look to fullest extent in any direction, potentially prefers leftward gaze, no vertical moments noted.  V,VII: Face appears symmetric but difficult to asess with intubation, facial light touch sensation normal bilaterally  VIII: hearing normal bilaterally  IX,X: gag reflex present  XI: bilateral shoulder shrug  XII: Unable to asses  Motor:  Flaccid in all 4 extremities.  Sensory: No response to pain in all 4 extremities however he did seem to shrug his shoulder to nailbed pressure on the right. On prior exam he nodded  his head "yes"when I provided light touch to all 4 extremities.  Deep Tendon Reflexes: 2+ and symmetric throughout  Plantars:  Right: downgoing Left: downgoing  Cerebellar:  CV: pulses palpable throughout  No results found for this basename: cbc, bmp, coags, chol, tri, ldl, hga1c    Results for orders placed during the hospital encounter of 02/08/12 (from the past 48 hour(s))   COMPREHENSIVE METABOLIC PANEL Status: Abnormal    Collection Time    02/13/12 4:22 AM   Component  Value  Range  Comment    Sodium  144  135 - 145 mEq/L     Potassium  3.9  3.5 - 5.1 mEq/L     Chloride  110  96 - 112 mEq/L     CO2  26  19 - 32 mEq/L     Glucose, Bld  125 (*)  70 - 99 mg/dL     BUN  21  6 - 23 mg/dL     Creatinine, Ser  4.54  0.50 - 1.35 mg/dL     Calcium  8.3 (*)  8.4 - 10.5 mg/dL     Total Protein  5.9 (*)  6.0 - 8.3 g/dL     Albumin  1.5 (*)  3.5 - 5.2 g/dL     AST  93 (*)  0 - 37 U/L     ALT  67 (*)  0 - 53 U/L     Alkaline Phosphatase  143 (*)  39 - 117 U/L     Total Bilirubin  1.0  0.3 - 1.2 mg/dL     GFR calc non Af Amer  >90  >90 mL/min     GFR calc Af Amer  >90  >90 mL/min    CBC Status: Abnormal    Collection Time    02/13/12 4:22 AM   Component  Value  Range  Comment    WBC  27.2 (*)  4.0 - 10.5 K/uL     RBC  2.96 (*)  4.22 - 5.81 MIL/uL     Hemoglobin  9.0 (*)  13.0 - 17.0 g/dL     HCT  09.8 (*)  11.9 - 52.0 %     MCV  91.2  78.0 - 100.0 fL     MCH  30.4  26.0 - 34.0 pg     MCHC  33.3  30.0 - 36.0 g/dL     RDW  14.7 (*)  82.9 - 15.5 %     Platelets  384  150 - 400 K/uL    PROCALCITONIN Status: Normal    Collection Time    02/13/12 4:22 AM   Component  Value  Range  Comment    Procalcitonin  0.75     GLUCOSE, CAPILLARY Status: Abnormal    Collection Time    02/13/12 12:27 PM   Component  Value  Range  Comment    Glucose-Capillary  133 (*)  70 - 99 mg/dL     Comment 1  Documented in Chart      Comment 2  Notify RN     GLUCOSE, CAPILLARY Status: Abnormal    Collection  Time    02/13/12 5:14 PM   Component  Value  Range  Comment    Glucose-Capillary  123 (*)  70 - 99 mg/dL     Comment 1  Documented in Chart      Comment 2  Notify RN     GLUCOSE, CAPILLARY Status: Abnormal    Collection Time    02/13/12 8:09 PM   Component  Value  Range  Comment    Glucose-Capillary  156 (*)  70 - 99 mg/dL     Comment 1  Documented in Chart      Comment 2  Notify RN     GLUCOSE, CAPILLARY Status: Abnormal    Collection Time    02/13/12 11:30 PM   Component  Value  Range  Comment    Glucose-Capillary  120 (*)  70 - 99 mg/dL     Comment 1  Documented in Chart      Comment 2  Notify RN     GLUCOSE, CAPILLARY Status: Abnormal    Collection Time    02/14/12 4:18 AM   Component  Value  Range  Comment    Glucose-Capillary  132 (*)  70 -  99 mg/dL    BASIC METABOLIC PANEL Status: Abnormal    Collection Time    02/14/12 4:29 AM   Component  Value  Range  Comment    Sodium  145  135 - 145 mEq/L     Potassium  4.1  3.5 - 5.1 mEq/L     Chloride  112  96 - 112 mEq/L     CO2  28  19 - 32 mEq/L     Glucose, Bld  148 (*)  70 - 99 mg/dL     BUN  21  6 - 23 mg/dL     Creatinine, Ser  1.61  0.50 - 1.35 mg/dL     Calcium  8.1 (*)  8.4 - 10.5 mg/dL     GFR calc non Af Amer  >90  >90 mL/min     GFR calc Af Amer  >90  >90 mL/min    CBC Status: Abnormal    Collection Time    02/14/12 4:29 AM   Component  Value  Range  Comment    WBC  26.8 (*)  4.0 - 10.5 K/uL     RBC  2.84 (*)  4.22 - 5.81 MIL/uL     Hemoglobin  8.5 (*)  13.0 - 17.0 g/dL     HCT  09.6 (*)  04.5 - 52.0 %     MCV  93.0  78.0 - 100.0 fL     MCH  29.9  26.0 - 34.0 pg     MCHC  32.2  30.0 - 36.0 g/dL     RDW  40.9 (*)  81.1 - 15.5 %     Platelets  477 (*)  150 - 400 K/uL    GLUCOSE, CAPILLARY Status: Abnormal    Collection Time    02/14/12 8:19 AM   Component  Value  Range  Comment    Glucose-Capillary  116 (*)  70 - 99 mg/dL     Comment 1  Documented in Chart      Comment 2  Notify RN     HEPARIN LEVEL  (UNFRACTIONATED) Status: Abnormal    Collection Time    02/14/12 8:30 AM   Component  Value  Range  Comment    Heparin Unfractionated  <0.10 (*)  0.30 - 0.70 IU/mL     Dg Chest Port 1 View  02/14/2012 *RADIOLOGY REPORT* Clinical Data: Postop from left lung and chest wall abscess. PORTABLE CHEST - 1 VIEW Comparison: 02/13/2012 Findings: Support apparatus including left chest tube remain in appropriate position. No pneumothorax identified. Asymmetric airspace disease is again seen involving left lung greater than right, without significant change. Heart size is normal. IMPRESSION: No significant change in asymmetric bilateral airspace disease. No pneumothorax identified. Original Report Authenticated By: Myles Rosenthal, M.D.  Dg Chest Port 1 View  02/13/2012 *RADIOLOGY REPORT* Clinical Data: Chest tube, follow-up PORTABLE CHEST - 1 VIEW Comparison: Portable exam 0649 hours compared to 02/12/2012 Findings: Tip of endotracheal tube 5.7 cm above carina. Nasogastric tube extends into stomach. Right jugular central venous catheter tip projecting over SVC. Left basilar thoracostomy tube. Stable heart size and mediastinal contours. Patchy infiltrates identified throughout left lung and at right base. No gross pleural effusion or pneumothorax identified. IMPRESSION: No interval change. Original Report Authenticated By: Ulyses Southward, M.D.   Assessment/Plan:  52 YO male hospitalized for Staphylococcus aureus bacteremia with sepsis, Lung abscess, DVT of upper extremity (deep vein thrombosis) now presenting with quadriplegia after weaning from sedation 02/14/12.  Etiology is unclear at this time but differential includes: transverse myelitis, central cord abscess, embolic shower from infective source.  Plan:   Neurology was asked to see patient for further evaluation of bilateral quadriplegia. I was then consulted on an emergent basis by Neurology because of epidural compressive lesion in the cervical spine with cord edema.  I  reviewed the studies with patient's wife and mother and also with Dr. Constance Goltz.  The patient has acute quadriplegia.  We cannot determine the degree of cord compression versus infarct and Dr. Constance Goltz is under the impression that the patient has ongoing significant cord compression.  The concern is that the patient's one chance for neurologic improvement is acute decompression, but I have indicated to them that my expectations for marked improvement are not great.  Given the rapid progression to quadriplegia, otherwise healthy state, devastating nature of this cord injury and limited chances for improvement, they wish to proceed with surgery.  They understand the risk that he may not improve and also understand that he may require surgical stabilization of his cervical spine, given his pre-existing kyphosis, but I do not think that posterior instrumentation is appropriate at this time.  The MRI shows significant posterior cord compression from C3-C7 and it is elected to take the patient to surgery on an emergent basis for cervical laminectomy C3 to C7 levels.

## 2012-02-15 ENCOUNTER — Inpatient Hospital Stay (HOSPITAL_COMMUNITY): Payer: Medicare HMO

## 2012-02-15 ENCOUNTER — Institutional Professional Consult (permissible substitution): Payer: Medicare HMO | Admitting: Radiation Oncology

## 2012-02-15 ENCOUNTER — Ambulatory Visit: Payer: Medicare HMO

## 2012-02-15 ENCOUNTER — Encounter (HOSPITAL_COMMUNITY): Payer: Self-pay | Admitting: Neurosurgery

## 2012-02-15 DIAGNOSIS — G061 Intraspinal abscess and granuloma: Secondary | ICD-10-CM

## 2012-02-15 LAB — CBC
HCT: 25.8 % — ABNORMAL LOW (ref 39.0–52.0)
Platelets: 574 10*3/uL — ABNORMAL HIGH (ref 150–400)
RDW: 16.4 % — ABNORMAL HIGH (ref 11.5–15.5)
WBC: 21.8 10*3/uL — ABNORMAL HIGH (ref 4.0–10.5)

## 2012-02-15 LAB — ANAEROBIC CULTURE

## 2012-02-15 LAB — COMPREHENSIVE METABOLIC PANEL
AST: 55 U/L — ABNORMAL HIGH (ref 0–37)
CO2: 29 mEq/L (ref 19–32)
Calcium: 8.2 mg/dL — ABNORMAL LOW (ref 8.4–10.5)
Creatinine, Ser: 0.68 mg/dL (ref 0.50–1.35)
GFR calc non Af Amer: >90 mL/min (ref >90)

## 2012-02-15 LAB — GLUCOSE, CAPILLARY
Glucose-Capillary: 110 mg/dL — ABNORMAL HIGH (ref 70–99)
Glucose-Capillary: 133 mg/dL — ABNORMAL HIGH (ref 70–99)
Glucose-Capillary: 113 mg/dL — ABNORMAL HIGH (ref 70–99)

## 2012-02-15 LAB — POCT I-STAT 3, ART BLOOD GAS (G3+)
Acid-Base Excess: 2 mmol/L (ref 0.0–2.0)
O2 Saturation: 94 %
TCO2: 29 mmol/L (ref 0–100)

## 2012-02-15 LAB — PHOSPHORUS: Phosphorus: 3 mg/dL (ref 2.3–4.6)

## 2012-02-15 LAB — VANCOMYCIN, TROUGH: Vancomycin Tr: 17.4 ug/mL (ref 10.0–20.0)

## 2012-02-15 MED ORDER — OXEPA PO LIQD
1000.0000 mL | ORAL | Status: DC
  Administered 2012-02-16: 1000 mL
  Filled 2012-02-15 (×4): qty 1000

## 2012-02-15 MED ORDER — GENTAMICIN IN SALINE 1-0.9 MG/ML-% IV SOLN
100.0000 mg | Freq: Three times a day (TID) | INTRAVENOUS | Status: DC
  Filled 2012-02-15 (×2): qty 100

## 2012-02-15 MED ORDER — PRO-STAT SUGAR FREE PO LIQD
30.0000 mL | Freq: Every day | ORAL | Status: DC
  Administered 2012-02-16 – 2012-02-17 (×7): 30 mL
  Filled 2012-02-15 (×13): qty 30

## 2012-02-15 MED ORDER — ADULT MULTIVITAMIN LIQUID CH
5.0000 mL | Freq: Every day | ORAL | Status: DC
  Administered 2012-02-16: 5 mL
  Filled 2012-02-15 (×2): qty 5

## 2012-02-15 MED ORDER — GENTAMICIN IN SALINE 1.6-0.9 MG/ML-% IV SOLN
80.0000 mg | Freq: Three times a day (TID) | INTRAVENOUS | Status: DC
  Administered 2012-02-15 – 2012-02-18 (×9): 80 mg via INTRAVENOUS
  Filled 2012-02-15 (×11): qty 50

## 2012-02-15 NOTE — Progress Notes (Signed)
INFECTIOUS DISEASE PROGRESS NOTE  ID: Frank Brock is a 52 y.o. male with   Principal Problem:  *Staphylococcus aureus bacteremia with sepsis Active Problems:  H/O hypercholesteremia  H/O stroke  Acute respiratory failure  Chest wall abscess  Lung abscess  Status post thoracotomy, 1/19 - [facility]  Paraplegia  DVT of upper extremity (deep vein thrombosis)  Quadriplegia  Subjective: Awake but no mvmt of his extremities  Abtx:  Anti-infectives     Start     Dose/Rate Route Frequency Ordered Stop   02/14/12 1654   bacitracin 50,000 Units in sodium chloride irrigation 0.9 % 500 mL irrigation  Status:  Discontinued          As needed 02/14/12 1706 02/14/12 1822   02/12/12 2000   vancomycin (VANCOCIN) 1,250 mg in sodium chloride 0.9 % 250 mL IVPB        1,250 mg 166.7 mL/hr over 90 Minutes Intravenous Every 8 hours 02/12/12 1333     02/11/12 1100   vancomycin (VANCOCIN) IVPB 1000 mg/200 mL premix  Status:  Discontinued        1,000 mg 200 mL/hr over 60 Minutes Intravenous Every 8 hours 02/11/12 1042 02/12/12 1332   02/11/12 0045   vancomycin (VANCOCIN) IVPB 1000 mg/200 mL premix  Status:  Discontinued     Comments: CONTINUE TILL FURTHER NOTICE      1,000 mg 200 mL/hr over 60 Minutes Intravenous Every 12 hours 02/10/12 1803 02/11/12 1041   02/10/12 2200   ceFAZolin (ANCEF) IVPB 2 g/50 mL premix  Status:  Discontinued        2 g 100 mL/hr over 30 Minutes Intravenous 3 times per day 02/10/12 1610 02/10/12 1755   02/10/12 2200   piperacillin-tazobactam (ZOSYN) IVPB 3.375 g  Status:  Discontinued        3.375 g 12.5 mL/hr over 240 Minutes Intravenous 3 times per day 02/10/12 1834 02/11/12 1130   02/10/12 1400   vancomycin (VANCOCIN) IVPB 1000 mg/200 mL premix  Status:  Discontinued        1,000 mg 200 mL/hr over 60 Minutes Intravenous Every 8 hours 02/10/12 1111 02/10/12 1755   02/10/12 1200   cefTRIAXone (ROCEPHIN) 1 g in dextrose 5 % 50 mL IVPB  Status:  Discontinued         1 g 100 mL/hr over 30 Minutes Intravenous Every 12 hours 02/10/12 1108 02/10/12 1610   02/09/12 1800   vancomycin (VANCOCIN) IVPB 1000 mg/200 mL premix  Status:  Discontinued        1,000 mg 200 mL/hr over 60 Minutes Intravenous Every 12 hours 02/09/12 1318 02/10/12 1111   02/09/12 0600   vancomycin (VANCOCIN) 1,250 mg in sodium chloride 0.9 % 250 mL IVPB  Status:  Discontinued        1,250 mg 166.7 mL/hr over 90 Minutes Intravenous Every 24 hours 02/08/12 1720 02/09/12 1318   02/08/12 2200   piperacillin-tazobactam (ZOSYN) IVPB 3.375 g  Status:  Discontinued        3.375 g 12.5 mL/hr over 240 Minutes Intravenous 3 times per day 02/08/12 1649 02/10/12 1108   02/08/12 2000   oseltamivir (TAMIFLU) capsule 75 mg  Status:  Discontinued        75 mg Oral 2 times daily 02/08/12 1720 02/10/12 1108   02/08/12 1700   vancomycin (VANCOCIN) IVPB 1000 mg/200 mL premix  Status:  Discontinued        1,000 mg 200 mL/hr over 60 Minutes Intravenous Every  12 hours 02/08/12 1649 02/08/12 1720   02/08/12 1530   vancomycin (VANCOCIN) IVPB 1000 mg/200 mL premix        1,000 mg 200 mL/hr over 60 Minutes Intravenous  Once 02/08/12 1527 02/08/12 2000   02/08/12 1530  piperacillin-tazobactam (ZOSYN) IVPB 3.375 g       3.375 g 12.5 mL/hr over 240 Minutes Intravenous  Once 02/08/12 1527 02/08/12 2004          Medications:  Scheduled:   . sodium chloride   Intravenous Once  . ipratropium  0.5 mg Nebulization Q6H   And  . albuterol  2.5 mg Nebulization Q6H  . antiseptic oral rinse  1 application Mouth Rinse QID  . bisacodyl  10 mg Oral Daily  . chlorhexidine  15 mL Mouth Rinse BID  . feeding supplement  30 mL Per Tube QID  . folic acid  1 mg Intravenous Daily  . insulin aspart  2-6 Units Subcutaneous Q4H  . pantoprazole sodium  40 mg Per Tube Daily  . sodium chloride  10-40 mL Intracatheter Q12H  . sodium chloride  3 mL Intravenous Q12H  . thiamine  100 mg Intravenous Daily  . vancomycin   1,250 mg Intravenous Q8H    Objective: Vital signs in last 24 hours: Temp:  [100.4 F (38 C)-101.8 F (38.8 C)] 101.8 F (38.8 C) (01/24 0749) Pulse Rate:  [97-112] 112  (01/24 0942) Resp:  [14-28] 23  (01/24 0942) BP: (121-171)/(59-60) 171/60 mmHg (01/24 0942) SpO2:  [91 %-99 %] 95 % (01/24 0942) Arterial Line BP: (119-183)/(41-78) 158/55 mmHg (01/24 0900) FiO2 (%):  [30 %-50 %] 40 % (01/24 0952) Weight:  [86.1 kg (189 lb 13.1 oz)] 86.1 kg (189 lb 13.1 oz) (01/24 0400)   General appearance: alert, flushed and mild distress Resp: rhonchi bilaterally Cardio: regular rate and rhythm GI: normal findings: bowel sounds normal and soft, non-tender Neurologic: Motor: no movement of his toes or fingers  Lab Results  Basename 02/15/12 0450 02/14/12 0429  WBC 21.8* 26.8*  HGB 7.9* 8.5*  HCT 25.8* 26.4*  NA 146* 145  K 4.5 4.1  CL 111 112  CO2 29 28  BUN 18 21  CREATININE 0.68 0.75  GLU -- --   Liver Panel  Basename 02/15/12 0450 02/13/12 0422  PROT 6.2 5.9*  ALBUMIN 1.4* 1.5*  AST 55* 93*  ALT 60* 67*  ALKPHOS 122* 143*  BILITOT 0.6 1.0  BILIDIR -- --  IBILI -- --   Sedimentation Rate No results found for this basename: ESRSEDRATE in the last 72 hours C-Reactive Protein No results found for this basename: CRP:2 in the last 72 hours  Microbiology: Recent Results (from the past 240 hour(s))  TECHNOLOGIST REVIEW     Status: Normal   Collection Time   02/06/12  9:42 AM      Component Value Range Status Comment   Technologist Review Occ Metas and Myelocytes present   Final   CULTURE, BLOOD (ROUTINE X 2)     Status: Normal   Collection Time   02/08/12  3:39 PM      Component Value Range Status Comment   Specimen Description BLOOD RIGHT ANTECUBITAL   Final    Special Requests BOTTLES DRAWN AEROBIC AND ANAEROBIC 5CC   Final    Culture  Setup Time 02/08/2012 23:18   Final    Culture     Final    Value: METHICILLIN RESISTANT STAPHYLOCOCCUS AUREUS     Note: RIFAMPIN  AND  GENTAMICIN SHOULD NOT BE USED AS SINGLE DRUGS FOR TREATMENT OF STAPH INFECTIONS. This organism DOES NOT demonstrate inducible Clindamycin resistance in vitro. CRITICAL RESULT CALLED TO, READ BACK BY AND VERIFIED WITH: MANDY ROLLS      02/10/12 @ 9:03PM BY RUSCA.     Note: Gram Stain Report Called to,Read Back By and Verified With: MEELY RICHARDSON 02/09/12 @ 2:26PM BY RUSCA.   Report Status 02/11/2012 FINAL   Final    Organism ID, Bacteria METHICILLIN RESISTANT STAPHYLOCOCCUS AUREUS   Final   CULTURE, BLOOD (ROUTINE X 2)     Status: Normal   Collection Time   02/08/12  3:49 PM      Component Value Range Status Comment   Specimen Description BLOOD LEFT HAND   Final    Special Requests BOTTLES DRAWN AEROBIC ONLY 2CC   Final    Culture  Setup Time 02/08/2012 23:19   Final    Culture     Final    Value: STAPHYLOCOCCUS AUREUS     Note: SUSCEPTIBILITIES PERFORMED ON PREVIOUS CULTURE WITHIN THE LAST 5 DAYS. CRITICAL RESULT CALLED TO, READ BACK BY AND VERIFIED WITH: MANDY ROLLS 02/10/12 @ 9:03PM BY RUSCA.     Note: Gram Stain Report Called to,Read Back By and Verified With: MEELY RICHARDSON 02/09/12 @ 2:26PM BY RUSCA.   Report Status 02/11/2012 FINAL   Final   URINE CULTURE     Status: Normal   Collection Time   02/08/12  4:58 PM      Component Value Range Status Comment   Specimen Description URINE, CATHETERIZED   Final    Special Requests NONE   Final    Culture  Setup Time 02/09/2012 01:48   Final    Colony Count NO GROWTH   Final    Culture NO GROWTH   Final    Report Status 02/10/2012 FINAL   Final   CULTURE, EXPECTORATED SPUTUM-ASSESSMENT     Status: Normal   Collection Time   02/08/12  5:54 PM      Component Value Range Status Comment   Specimen Description SPUTUM   Final    Special Requests NONE   Final    Sputum evaluation     Final    Value: THIS SPECIMEN IS ACCEPTABLE. RESPIRATORY CULTURE REPORT TO FOLLOW.   Report Status 02/08/2012 FINAL   Final   MRSA PCR SCREENING     Status:  Abnormal   Collection Time   02/08/12  5:54 PM      Component Value Range Status Comment   MRSA by PCR POSITIVE (*) NEGATIVE Final   CULTURE, RESPIRATORY     Status: Normal   Collection Time   02/08/12  5:54 PM      Component Value Range Status Comment   Specimen Description SPUTUM   Final    Special Requests NONE   Final    Gram Stain     Final    Value: ABUNDANT WBC PRESENT,BOTH PMN AND MONONUCLEAR     RARE SQUAMOUS EPITHELIAL CELLS PRESENT     ABUNDANT GRAM POSITIVE COCCI     IN PAIRS IN CLUSTERS   Culture     Final    Value: ABUNDANT METHICILLIN RESISTANT STAPHYLOCOCCUS AUREUS     Note: RIFAMPIN AND GENTAMICIN SHOULD NOT BE USED AS SINGLE DRUGS FOR TREATMENT OF STAPH INFECTIONS. This organism DOES NOT demonstrate inducible Clindamycin resistance in vitro.   Report Status 02/12/2012 FINAL   Final    Organism ID, Bacteria METHICILLIN  RESISTANT STAPHYLOCOCCUS AUREUS   Final   CULTURE, BLOOD (ROUTINE X 2)     Status: Normal (Preliminary result)   Collection Time   02/09/12  7:50 PM      Component Value Range Status Comment   Specimen Description Blood   Final    Special Requests NONE   Final    Culture  Setup Time 02/10/2012 02:13   Final    Culture     Final    Value:        BLOOD CULTURE RECEIVED NO GROWTH TO DATE CULTURE WILL BE HELD FOR 5 DAYS BEFORE ISSUING A FINAL NEGATIVE REPORT   Report Status PENDING   Incomplete   CULTURE, BLOOD (ROUTINE X 2)     Status: Normal (Preliminary result)   Collection Time   02/09/12  7:55 PM      Component Value Range Status Comment   Specimen Description Blood   Final    Special Requests NONE   Final    Culture  Setup Time 02/10/2012 02:13   Final    Culture     Final    Value:        BLOOD CULTURE RECEIVED NO GROWTH TO DATE CULTURE WILL BE HELD FOR 5 DAYS BEFORE ISSUING A FINAL NEGATIVE REPORT   Report Status PENDING   Incomplete   ANAEROBIC CULTURE     Status: Normal (Preliminary result)   Collection Time   02/10/12  4:32 PM       Component Value Range Status Comment   Specimen Description ABSCESS LEFT CHEST   Final    Special Requests PATIENT ON FOLLOWING ANCEF VANC   Final    Gram Stain PENDING   Incomplete    Culture     Final    Value: NO ANAEROBES ISOLATED; CULTURE IN PROGRESS FOR 5 DAYS   Report Status PENDING   Incomplete   GRAM STAIN     Status: Normal   Collection Time   02/10/12  4:32 PM      Component Value Range Status Comment   Specimen Description ABSCESS LEFT CHEST   Final    Special Requests PATIENT ON FOLLOWING ANCEF VANC   Final    Gram Stain     Final    Value: ABUNDANT WBC PRESENT, PREDOMINANTLY PMN     ABUNDANT GRAM POSITIVE COCCI IN CLUSTERS     Gram Stain Report Called to,Read Back By and Verified With: P.WEATHERLY,RN 02/10/12 1725 EHOWARD   Report Status 02/10/2012 FINAL   Final   CULTURE, ROUTINE-ABSCESS     Status: Normal   Collection Time   02/10/12  4:34 PM      Component Value Range Status Comment   Specimen Description ABSCESS LEFT CHEST   Final    Special Requests PATIENT ON FOLLOWING VANC ANCEF   Final    Gram Stain     Final    Value: ABUNDANT WBC PRESENT, PREDOMINANTLY PMN     NO SQUAMOUS EPITHELIAL CELLS SEEN     ABUNDANT GRAM POSITIVE COCCI IN CLUSTERS     Gram Stain Report Called to,Read Back By and Verified With: Gram Stain Report Called to,Read Back By and Verified With: P WEATHERLY RN 02/10/12 1725 BY ZOXWRUE Performed at [facility]   Culture     Final    Value: ABUNDANT METHICILLIN RESISTANT STAPHYLOCOCCUS AUREUS     Note: RIFAMPIN AND GENTAMICIN SHOULD NOT BE USED AS SINGLE DRUGS FOR TREATMENT OF STAPH INFECTIONS. This organism  DOES NOT demonstrate inducible Clindamycin resistance in vitro. CRITICAL RESULT CALLED TO, READ BACK BY AND VERIFIED WITH: SUSAN F 1/22 @      820 BY REAMM   Report Status 02/13/2012 FINAL   Final    Organism ID, Bacteria METHICILLIN RESISTANT STAPHYLOCOCCUS AUREUS   Final   GRAM STAIN     Status: Normal   Collection Time   02/14/12   5:25 PM      Component Value Range Status Comment   Specimen Description ABSCESS NECK LEFT   Final    Special Requests PATIENT ON FOLLOWING VANCOMYCIN   Final    Gram Stain     Final    Value: RARE WBC PRESENT, PREDOMINANTLY PMN     NO ORGANISMS SEEN     Gram Stain Report Called to,Read Back By and Verified With: RN T. HARRELSON 02/14/12 1924 KERAN M.   Report Status 02/14/2012 FINAL   Final   ANAEROBIC CULTURE     Status: Normal (Preliminary result)   Collection Time   02/14/12  5:25 PM      Component Value Range Status Comment   Specimen Description ABSCESS NECK LEFT   Final    Special Requests PATIENT ON FOLLOWING VANCOMYCIN   Final    Gram Stain     Final    Value: RARE WBC PRESENT, PREDOMINANTLY PMN     NO ORGANISMS SEEN     Gram Stain Report Called to,Read Back By and Verified With: Gram Stain Report Called to,Read Back By and Verified With: RN T HARRELSON @18 :24 ON 02/14/12 BY Sallyanne Kuster M. Performed at [facility]   Culture PENDING   Incomplete    Report Status PENDING   Incomplete   CULTURE, ROUTINE-ABSCESS     Status: Normal (Preliminary result)   Collection Time   02/14/12  5:25 PM      Component Value Range Status Comment   Specimen Description ABSCESS NECK LEFT   Final    Special Requests PATIENT ON FOLLOWING VANCOMYCIN   Final    Gram Stain     Final    Value: RARE WBC PRESENT, PREDOMINANTLY PMN     NO ORGANISMS SEEN     Gram Stain Report Called to,Read Back By and Verified With: Gram Stain Report Called to,Read Back By and Verified With: RN T. HARRELSON @19 :24 ON 02/14/12 BY Sallyanne Kuster M. Performed at [facility]   Culture NO GROWTH   Final    Report Status PENDING   Incomplete     Studies/Results: Dg Cervical Spine 1 View  02/14/2012  *RADIOLOGY REPORT*  Clinical Data: Cervical laminectomy.  DG CERVICAL SPINE - 1 VIEW  Comparison: MR cervical spine 02/14/2012.  Findings: Single intraoperative view of the cervical spine in the lateral projection is provided.   Probes are identified at the level of the C2 and C3 spinous processes.  IMPRESSION: Localization as above.   Original Report Authenticated By: Holley Dexter, M.D.    Mr Laqueta Jean Wo Contrast  02/14/2012  **ADDENDUM** CREATED: 02/14/2012 15:14:30  Per discussion with Dr. Venetia Maxon 02/13/2022  3:15 p.m., possibility of cord infarct causing or contributing to the cervical cord / upper thoracic cord signal abnormality was discussed.  It is difficult to determine whether the cord signal abnormality is from compression or infarct.  **END ADDENDUM** SIGNED BY: Almedia Balls. Constance Goltz, M.D.   02/14/2012  *RADIOLOGY REPORT*  Clinical Data:  Staph Aureus septicemia.  Multiple lung cavitary lesions.  Left upper extremity deep venous  thrombosis.  Development of paralysis.  The patient was scanned while on a ventilator.  The present examination was tailored to complete the exam in  the most expedient fashion given the limitation of the time the ventilator would function within the MR room.  MRI HEAD WITHOUT AND WITH CONTRAST  Technique:  Multiplanar, multiecho pulse sequences of the brain and surrounding structures were obtained without and with intravenous contrast.  Contrast: 20mL MULTIHANCE GADOBENATE DIMEGLUMINE 529 MG/ML IV SOLN 20 ml MultiHance.  Comparison:  No comparison MR.  Findings:  No acute infarct.  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  Scattered small vessel disease type changes.  No intracranial hemorrhage.  No hydrocephalus.  Pulsation artifacts extend through the brain without discrete area of abnormal enhancement.  No intracranial mass identified.  Major intracranial vascular structures are patent.  Paranasal sinus opacification fairly significant involving the ethmoid sinus air cells and maxillary sinuses. Mastoid air cell opacification bilaterally.  Orbital structures grossly intact.  IMPRESSION: Remote infarcts and small vessel disease type  changes as noted above.  No acute infarct.  No definitive findings of abnormal intracranial enhancement.  Prominent paranasal sinus and mastoid air cell opacification.  MRI CERVICAL, THORACIC AND LUMBAR SPINE WITHOUT AND WITH CONTRAST  Technique:  Multiplanar and multiecho pulse sequences of the cervical spine, to include the craniocervical junction and cervicothoracic junction, and thoracic and lumbar spine, were obtained without and with intravenous contrast.  MRI CERVICAL SPINE  Findings:  Markedly abnormal examination.  Altered signal intensity involving portions of C2-T1 vertebral. Altered signal intensity of the surrounding soft tissue/musculature.  Heterogeneous epidural enhancement throughout the cervical spine and extending into the upper thoracic spine. Within this epidural thickenning and enhancement, there are small areas which do not enhance.  Findings are highly suspicious for infection which may have track from the chest into the cervical canal and upper thoracic canal causing epidural phlegmon/epidural abscesses which when combined with cervical spondylotic changes is causing cord compression.  Within the compressed cord, there is altered signal intensity which extends from C2 through T1.  The appearance is suggestive of edema from the cord compression.  Other causes of cord signal abnormality such as that related to infectious transverse myelitis or post infection demyelinating process felt to be secondary less likely considerations which can be considered after mass effect upon the cord has been addressed.  Vertebral arteries remain patent.  Prominent pooling of secretions in this patient is intubated.  Cervical spondylotic changes include:  C2-3:  Small broad-based protrusion most notable left paracentral position.  C3-4:  Broad-based protrusion/osteophyte.  Uncinate hypertrophy. Kyphosis centered at this level.  C4-5:  Broad-based disc osteophyte greater to the right.  Uncinate bony overgrowth  with moderate to marked bilateral foraminal narrowing.  C5-6:  Broad-based protrusion/osteophyte.  Uncinate hypertrophy. Moderate to marked right-sided and mild to moderate left-sided foraminal narrowing.  C6-7:  No significant disc disease.  C7-T1:  Mild facet joint degenerative changes.  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  Blood as cause of epidural process felt to be secondary less likely consideration.  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 In the remainder of the thoracic spine, there is mild enhancement along the periphery of the cord which may represent prominent vasculature or subtle spread of  infection but without findings of cord compression.  T7-8:  Small right paracentral disc protrusion.  Minimal cord contact.  T8-9:  Shallow protrusion.  Altered signal intensity vertebra may reflect underlying changes of anemia.  Significant lung parenchymal changes.  Please see recent CT report.  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 In the remainder of the thoracic spine, there is mild enhancement along the periphery of the cord which may represent prominent vasculature or subtle spread of infection but without findings of cord compression caused by infection.  Please see above.  MRI LUMBAR SPINE  Findings: Conus upper L1 level.  There may be a subtle enhancement nerve roots indicating spread of infection however, no evidence of spinal stenosis or nerve root compression caused by infection.  L2-3:  Mild facet joint degenerative changes.  L3-4:  Mild facet joint degenerative changes.  L4-5:  Facet joint degenerative changes.  Mild bulge.  L5-S1:  Minimal facet joint degenerative changes.  Bilateral renal lesions some of  which can be confirmed as cysts whereas others are too small to characterize.  Altered signal intensity bone marrow may reflect changes of underlying anemia.  IMPRESSION: There may be a subtle enhancement nerve roots indicating spread of infection however, no evidence of spinal stenosis or nerve root compression caused by infection.  Critical Value/emergent results were called by telephone at the time of interpretation on 02/14/2012  at 1:40 p.m. to Dr. Amada Jupiter, who verbally acknowledged these results. Neurosurgical consultation recommended.  Original Report Authenticated By: Lacy Duverney, M.D.    Mr Cervical Spine W Wo Contrast  02/14/2012  **ADDENDUM** CREATED: 02/14/2012 15:14:30  Per discussion with Dr. Venetia Maxon 02/13/2022  3:15 p.m., possibility of cord infarct causing or contributing to the cervical cord / upper thoracic cord signal abnormality was discussed.  It is difficult to determine whether the cord signal abnormality is from compression or infarct.  **END ADDENDUM** SIGNED BY: Almedia Balls. Constance Goltz, M.D.   02/14/2012  *RADIOLOGY REPORT*  Clinical Data:  Staph Aureus septicemia.  Multiple lung cavitary lesions.  Left upper extremity deep venous thrombosis.  Development of paralysis.  The patient was scanned while on a ventilator.  The present examination was tailored to complete the exam in  the most expedient fashion given the limitation of the time the ventilator would function within the MR room.  MRI HEAD WITHOUT AND WITH CONTRAST  Technique:  Multiplanar, multiecho pulse sequences of the brain and surrounding structures were obtained without and with intravenous contrast.  Contrast: 20mL MULTIHANCE GADOBENATE DIMEGLUMINE 529 MG/ML IV SOLN 20 ml MultiHance.  Comparison:  No comparison MR.  Findings:  No acute infarct.  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  Scattered small vessel disease type changes.  No intracranial hemorrhage.  No  hydrocephalus.  Pulsation artifacts extend through the brain without discrete area of abnormal enhancement.  No intracranial mass identified.  Major intracranial vascular structures are patent.  Paranasal sinus opacification fairly significant involving the ethmoid sinus air cells and maxillary sinuses. Mastoid air cell opacification bilaterally.  Orbital structures grossly intact.  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  No acute infarct.  No definitive findings of abnormal intracranial enhancement.  Prominent paranasal sinus and mastoid air cell opacification.  MRI CERVICAL, THORACIC AND LUMBAR SPINE WITHOUT AND WITH CONTRAST  Technique:  Multiplanar and multiecho pulse sequences of the cervical spine, to include the craniocervical junction and  cervicothoracic junction, and thoracic and lumbar spine, were obtained without and with intravenous contrast.  MRI CERVICAL SPINE  Findings:  Markedly abnormal examination.  Altered signal intensity involving portions of C2-T1 vertebral. Altered signal intensity of the surrounding soft tissue/musculature.  Heterogeneous epidural enhancement throughout the cervical spine and extending into the upper thoracic spine. Within this epidural thickenning and enhancement, there are small areas which do not enhance.  Findings are highly suspicious for infection which may have track from the chest into the cervical canal and upper thoracic canal causing epidural phlegmon/epidural abscesses which when combined with cervical spondylotic changes is causing cord compression.  Within the compressed cord, there is altered signal intensity which extends from C2 through T1.  The appearance is suggestive of edema from the cord compression.  Other causes of cord signal abnormality such as that related to infectious transverse myelitis or post infection demyelinating process felt to be secondary less likely considerations which can be considered after  mass effect upon the cord has been addressed.  Vertebral arteries remain patent.  Prominent pooling of secretions in this patient is intubated.  Cervical spondylotic changes include:  C2-3:  Small broad-based protrusion most notable left paracentral position.  C3-4:  Broad-based protrusion/osteophyte.  Uncinate hypertrophy. Kyphosis centered at this level.  C4-5:  Broad-based disc osteophyte greater to the right.  Uncinate bony overgrowth with moderate to marked bilateral foraminal narrowing.  C5-6:  Broad-based protrusion/osteophyte.  Uncinate hypertrophy. Moderate to marked right-sided and mild to moderate left-sided foraminal narrowing.  C6-7:  No significant disc disease.  C7-T1:  Mild facet joint degenerative changes.  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  Blood as cause of epidural process felt to be secondary less likely consideration.  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 In the remainder of the thoracic spine, there is mild enhancement along the periphery of the cord which may represent prominent vasculature or subtle spread of infection but without findings of cord compression.  T7-8:  Small right paracentral disc protrusion.  Minimal cord contact.  T8-9:  Shallow protrusion.  Altered signal intensity vertebra may reflect underlying changes of anemia.  Significant lung parenchymal changes.  Please see recent CT report.  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 In the remainder of the thoracic spine, there is mild enhancement along the periphery of the cord which may represent prominent vasculature or subtle spread of infection but without findings of cord compression caused by infection.  Please see above.  MRI  LUMBAR SPINE  Findings: Conus upper L1 level.  There may be a subtle enhancement nerve roots indicating spread of infection however, no evidence of spinal stenosis or nerve root compression caused by infection.  L2-3:  Mild facet joint degenerative changes.  L3-4:  Mild facet joint degenerative changes.  L4-5:  Facet joint degenerative changes.  Mild bulge.  L5-S1:  Minimal facet joint degenerative changes.  Bilateral renal lesions some of which can be confirmed as cysts whereas others are too small to characterize.  Altered signal intensity bone marrow may reflect changes of underlying anemia.  IMPRESSION: There may be a subtle enhancement nerve roots indicating spread of infection however, no evidence of spinal stenosis or nerve root compression caused by infection.  Critical Value/emergent results were called by telephone  at the time of interpretation on 02/14/2012  at 1:40 p.m. to Dr. Amada Jupiter, who verbally acknowledged these results. Neurosurgical consultation recommended.  Original Report Authenticated By: Lacy Duverney, M.D.    Mr Thoracic Spine W Wo Contrast  02/14/2012  **ADDENDUM** CREATED: 02/14/2012 15:14:30  Per discussion with Dr. Venetia Maxon 02/13/2022  3:15 p.m., possibility of cord infarct causing or contributing to the cervical cord / upper thoracic cord signal abnormality was discussed.  It is difficult to determine whether the cord signal abnormality is from compression or infarct.  **END ADDENDUM** SIGNED BY: Almedia Balls. Constance Goltz, M.D.   02/14/2012  *RADIOLOGY REPORT*  Clinical Data:  Staph Aureus septicemia.  Multiple lung cavitary lesions.  Left upper extremity deep venous thrombosis.  Development of paralysis.  The patient was scanned while on a ventilator.  The present examination was tailored to complete the exam in  the most expedient fashion given the limitation of the time the ventilator would function within the MR room.  MRI HEAD WITHOUT AND WITH CONTRAST  Technique:  Multiplanar, multiecho  pulse sequences of the brain and surrounding structures were obtained without and with intravenous contrast.  Contrast: 20mL MULTIHANCE GADOBENATE DIMEGLUMINE 529 MG/ML IV SOLN 20 ml MultiHance.  Comparison:  No comparison MR.  Findings:  No acute infarct.  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  Scattered small vessel disease type changes.  No intracranial hemorrhage.  No hydrocephalus.  Pulsation artifacts extend through the brain without discrete area of abnormal enhancement.  No intracranial mass identified.  Major intracranial vascular structures are patent.  Paranasal sinus opacification fairly significant involving the ethmoid sinus air cells and maxillary sinuses. Mastoid air cell opacification bilaterally.  Orbital structures grossly intact.  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  No acute infarct.  No definitive findings of abnormal intracranial enhancement.  Prominent paranasal sinus and mastoid air cell opacification.  MRI CERVICAL, THORACIC AND LUMBAR SPINE WITHOUT AND WITH CONTRAST  Technique:  Multiplanar and multiecho pulse sequences of the cervical spine, to include the craniocervical junction and cervicothoracic junction, and thoracic and lumbar spine, were obtained without and with intravenous contrast.  MRI CERVICAL SPINE  Findings:  Markedly abnormal examination.  Altered signal intensity involving portions of C2-T1 vertebral. Altered signal intensity of the surrounding soft tissue/musculature.  Heterogeneous epidural enhancement throughout the cervical spine and extending into the upper thoracic spine. Within this epidural thickenning and enhancement, there are small areas which do not enhance.  Findings are highly suspicious for infection which may have track from the chest into the cervical canal and upper thoracic canal causing epidural phlegmon/epidural abscesses which when  combined with cervical spondylotic changes is causing cord compression.  Within the compressed cord, there is altered signal intensity which extends from C2 through T1.  The appearance is suggestive of edema from the cord compression.  Other causes of cord signal abnormality such as that related to infectious transverse myelitis or post infection demyelinating process felt to be secondary less likely considerations which can be considered after mass effect upon the cord has been addressed.  Vertebral arteries remain patent.  Prominent pooling of secretions in this patient is intubated.  Cervical spondylotic changes include:  C2-3:  Small broad-based protrusion most notable left paracentral position.  C3-4:  Broad-based protrusion/osteophyte.  Uncinate hypertrophy. Kyphosis centered at this level.  C4-5:  Broad-based disc osteophyte greater to the right.  Uncinate bony overgrowth with moderate to marked bilateral  foraminal narrowing.  C5-6:  Broad-based protrusion/osteophyte.  Uncinate hypertrophy. Moderate to marked right-sided and mild to moderate left-sided foraminal narrowing.  C6-7:  No significant disc disease.  C7-T1:  Mild facet joint degenerative changes.  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  Blood as cause of epidural process felt to be secondary less likely consideration.  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 In the remainder of the thoracic spine, there is mild enhancement along the periphery of the cord which may represent prominent vasculature or subtle spread of infection but without findings of cord compression.  T7-8:  Small right paracentral disc protrusion.  Minimal cord contact.  T8-9:  Shallow protrusion.  Altered signal intensity vertebra may reflect  underlying changes of anemia.  Significant lung parenchymal changes.  Please see recent CT report.  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 In the remainder of the thoracic spine, there is mild enhancement along the periphery of the cord which may represent prominent vasculature or subtle spread of infection but without findings of cord compression caused by infection.  Please see above.  MRI LUMBAR SPINE  Findings: Conus upper L1 level.  There may be a subtle enhancement nerve roots indicating spread of infection however, no evidence of spinal stenosis or nerve root compression caused by infection.  L2-3:  Mild facet joint degenerative changes.  L3-4:  Mild facet joint degenerative changes.  L4-5:  Facet joint degenerative changes.  Mild bulge.  L5-S1:  Minimal facet joint degenerative changes.  Bilateral renal lesions some of which can be confirmed as cysts whereas others are too small to characterize.  Altered signal intensity bone marrow may reflect changes of underlying anemia.  IMPRESSION: There may be a subtle enhancement nerve roots indicating spread of infection however, no evidence of spinal stenosis or nerve root compression caused by infection.  Critical Value/emergent results were called by telephone at the time of interpretation on 02/14/2012  at 1:40 p.m. to Dr. Amada Jupiter, who verbally acknowledged these results. Neurosurgical consultation recommended.  Original Report Authenticated By: Lacy Duverney, M.D.    Mr Lumbar Spine W Wo Contrast  02/14/2012  **ADDENDUM** CREATED: 02/14/2012 15:14:30  Per discussion with Dr. Venetia Maxon 02/13/2022  3:15 p.m., possibility of cord infarct causing or contributing to the cervical cord / upper thoracic cord signal abnormality was discussed.  It is difficult to determine whether the cord signal abnormality is from compression or infarct.  **END ADDENDUM** SIGNED BY: Almedia Balls. Constance Goltz, M.D.   02/14/2012  *RADIOLOGY  REPORT*  Clinical Data:  Staph Aureus septicemia.  Multiple lung cavitary lesions.  Left upper extremity deep venous thrombosis.  Development of paralysis.  The patient was scanned while on a ventilator.  The present examination was tailored to complete the exam in  the most expedient fashion given the limitation of the time the ventilator would function within the MR room.  MRI HEAD WITHOUT AND WITH CONTRAST  Technique:  Multiplanar, multiecho pulse sequences of the brain and surrounding structures were obtained without and with intravenous contrast.  Contrast: 20mL MULTIHANCE GADOBENATE DIMEGLUMINE 529 MG/ML IV SOLN 20 ml MultiHance.  Comparison:  No comparison MR.  Findings:  No acute infarct.  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  Scattered small  vessel disease type changes.  No intracranial hemorrhage.  No hydrocephalus.  Pulsation artifacts extend through the brain without discrete area of abnormal enhancement.  No intracranial mass identified.  Major intracranial vascular structures are patent.  Paranasal sinus opacification fairly significant involving the ethmoid sinus air cells and maxillary sinuses. Mastoid air cell opacification bilaterally.  Orbital structures grossly intact.  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  No acute infarct.  No definitive findings of abnormal intracranial enhancement.  Prominent paranasal sinus and mastoid air cell opacification.  MRI CERVICAL, THORACIC AND LUMBAR SPINE WITHOUT AND WITH CONTRAST  Technique:  Multiplanar and multiecho pulse sequences of the cervical spine, to include the craniocervical junction and cervicothoracic junction, and thoracic and lumbar spine, were obtained without and with intravenous contrast.  MRI CERVICAL SPINE  Findings:  Markedly abnormal examination.  Altered signal intensity involving portions of C2-T1 vertebral. Altered signal  intensity of the surrounding soft tissue/musculature.  Heterogeneous epidural enhancement throughout the cervical spine and extending into the upper thoracic spine. Within this epidural thickenning and enhancement, there are small areas which do not enhance.  Findings are highly suspicious for infection which may have track from the chest into the cervical canal and upper thoracic canal causing epidural phlegmon/epidural abscesses which when combined with cervical spondylotic changes is causing cord compression.  Within the compressed cord, there is altered signal intensity which extends from C2 through T1.  The appearance is suggestive of edema from the cord compression.  Other causes of cord signal abnormality such as that related to infectious transverse myelitis or post infection demyelinating process felt to be secondary less likely considerations which can be considered after mass effect upon the cord has been addressed.  Vertebral arteries remain patent.  Prominent pooling of secretions in this patient is intubated.  Cervical spondylotic changes include:  C2-3:  Small broad-based protrusion most notable left paracentral position.  C3-4:  Broad-based protrusion/osteophyte.  Uncinate hypertrophy. Kyphosis centered at this level.  C4-5:  Broad-based disc osteophyte greater to the right.  Uncinate bony overgrowth with moderate to marked bilateral foraminal narrowing.  C5-6:  Broad-based protrusion/osteophyte.  Uncinate hypertrophy. Moderate to marked right-sided and mild to moderate left-sided foraminal narrowing.  C6-7:  No significant disc disease.  C7-T1:  Mild facet joint degenerative changes.  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  Blood as cause of epidural process felt to be secondary less likely consideration.  MRI THORACIC SPINE  Findings:  Ventral epidural infection and altered cord signal intensity ends at the T1 leve. In the remainder of the thoracic spine, there is mild enhancement along the periphery of the cord which may represent prominent vasculature or subtle spread of infection but without findings of cord compression.  T7-8:  Small right paracentral disc protrusion.  Minimal cord contact.  T8-9:  Shallow protrusion.  Altered signal intensity vertebra may reflect underlying changes of anemia.  Significant lung parenchymal changes.  Please see recent CT report.  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 In the remainder of the thoracic spine, there is mild enhancement along the periphery of the cord which may represent prominent vasculature or subtle spread of infection but without findings of cord compression caused by infection.  Please see above.  MRI LUMBAR SPINE  Findings: Conus upper L1 level.  There  may be a subtle enhancement nerve roots indicating spread of infection however, no evidence of spinal stenosis or nerve root compression caused by infection.  L2-3:  Mild facet joint degenerative changes.  L3-4:  Mild facet joint degenerative changes.  L4-5:  Facet joint degenerative changes.  Mild bulge.  L5-S1:  Minimal facet joint degenerative changes.  Bilateral renal lesions some of which can be confirmed as cysts whereas others are too small to characterize.  Altered signal intensity bone marrow may reflect changes of underlying anemia.  IMPRESSION: There may be a subtle enhancement nerve roots indicating spread of infection however, no evidence of spinal stenosis or nerve root compression caused by infection.  Critical Value/emergent results were called by telephone at the time of interpretation on 02/14/2012  at 1:40 p.m. to Dr. Amada Jupiter, who verbally acknowledged these results. Neurosurgical consultation recommended.  Original Report Authenticated By: Lacy Duverney, M.D.    Dg  Chest Port 1 View  02/15/2012  *RADIOLOGY REPORT*  Clinical Data: Evaluate endotracheal tube and lines.  Recent postop for left lung and chest wall abscesses.  PORTABLE CHEST - 1 VIEW  Comparison: 02/14/2012  Findings: Endotracheal tube 7.5 cm above carina.  Left IJ central line tip at mid SVC.  The nasogastric tube is not well visualized distally, and likely terminates in the mid to lower thoracic esophagus.  A left-sided chest tube is unchanged in position.  Normal heart size.  Probable small left pleural effusion. No pneumothorax.  Persistent left greater than right lower lobe predominant airspace disease.  Remote right rib trauma.  IMPRESSION:  1. NG tube not well visualized distally; likely terminates at the mid to lower thoracic esophagus.  Consider repositioning.   These results will be called to the ordering clinician or representative by the Radiologist Assistant, and communication documented in the PACS Dashboard. 2.  Otherwise, similar appearance of left greater than right lower lobe predominant airspace disease. 3.  Left chest tube in place; no pneumothorax.   Original Report Authenticated By: Jeronimo Greaves, M.D.    Dg Chest Port 1 View  02/14/2012  *RADIOLOGY REPORT*  Clinical Data: Postop from left lung and chest wall abscess.  PORTABLE CHEST - 1 VIEW  Comparison: 02/13/2012  Findings: Support apparatus including left chest tube remain in appropriate position.  No pneumothorax identified.  Asymmetric airspace disease is again seen involving left lung greater than right, without significant change.  Heart size is normal.  IMPRESSION: No significant change in asymmetric bilateral airspace disease.  No pneumothorax identified.   Original Report Authenticated By: Myles Rosenthal, M.D.      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 His LFTs are slightly elevated  so will avoid rifampin.  Suspect that his disseminated infection is related to the duration of his infection (chest mass noted 1-13) rather than antibiotic failure.  Hopeful waiting with regard to his neuro function.  Gave general recomendations about MRSA hygiene to his family member.   Total days of antibiotics: 7 (vanco)          Johny Sax Infectious Diseases [phone] 02/15/2012, 10:12 AM   LOS: 7 days

## 2012-02-15 NOTE — Progress Notes (Addendum)
ANTIBIOTIC CONSULT NOTE - FOLLOW UP  Pharmacy Consult for vancomycin Indication: MRSA PNA and bacteremia  No Known Allergies  Patient Measurements: Height: 5\' 10"  (177.8 cm) Weight: 189 lb 13.1 oz (86.1 kg) IBW/kg (Calculated) : 73    Vital Signs: Temp: 101.8 F (38.8 C) (01/24 0749) Temp src: Oral (01/24 0749) BP: 171/60 mmHg (01/24 0942) Pulse Rate: 112  (01/24 0942) Intake/Output from previous day: 01/23 0701 - 01/24 0700 In: 3827.4 [I.V.:3327.4; IV Piggyback:500] Out: 2614 [Urine:2350; Drains:160; Blood:100; Chest Tube:4] Intake/Output from this shift: Total I/O In: 811.4 [I.V.:811.4] Out: 200 [Urine:200]  Labs:  [facility] 02/15/12 0450 02/14/12 0429 02/13/12 0422  WBC 21.8* 26.8* 27.2*  HGB 7.9* 8.5* 9.0*  PLT 574* 477* 384  LABCREA -- -- --  CREATININE 0.68 0.75 0.70   Estimated Creatinine Clearance: 114.1 ml/min (by C-G formula based on Cr of 0.68). No results found for this basename: VANCOTROUGH:2,VANCOPEAK:2,VANCORANDOM:2,GENTTROUGH:2,GENTPEAK:2,GENTRANDOM:2,TOBRATROUGH:2,TOBRAPEAK:2,TOBRARND:2,AMIKACINPEAK:2,AMIKACINTROU:2,AMIKACIN:2, in the last 72 hours   Microbiology: Recent Results (from the past 720 hour(s))  TECHNOLOGIST REVIEW     Status: Normal   Collection Time   02/06/12  9:42 AM      Component Value Range Status Comment   Technologist Review Occ Metas and Myelocytes present   Final   CULTURE, BLOOD (ROUTINE X 2)     Status: Normal   Collection Time   02/08/12  3:39 PM      Component Value Range Status Comment   Specimen Description BLOOD RIGHT ANTECUBITAL   Final    Special Requests BOTTLES DRAWN AEROBIC AND ANAEROBIC 5CC   Final    Culture  Setup Time 02/08/2012 23:18   Final    Culture     Final    Value: METHICILLIN RESISTANT STAPHYLOCOCCUS AUREUS     Note: RIFAMPIN AND GENTAMICIN SHOULD NOT BE USED AS SINGLE DRUGS FOR TREATMENT OF STAPH INFECTIONS. This organism DOES NOT demonstrate inducible Clindamycin resistance in vitro. CRITICAL  RESULT CALLED TO, READ BACK BY AND VERIFIED WITH: MANDY ROLLS      02/10/12 @ 9:03PM BY RUSCA.     Note: Gram Stain Report Called to,Read Back By and Verified With: MEELY RICHARDSON 02/09/12 @ 2:26PM BY RUSCA.   Report Status 02/11/2012 FINAL   Final    Organism ID, Bacteria METHICILLIN RESISTANT STAPHYLOCOCCUS AUREUS   Final   CULTURE, BLOOD (ROUTINE X 2)     Status: Normal   Collection Time   02/08/12  3:49 PM      Component Value Range Status Comment   Specimen Description BLOOD LEFT HAND   Final    Special Requests BOTTLES DRAWN AEROBIC ONLY 2CC   Final    Culture  Setup Time 02/08/2012 23:19   Final    Culture     Final    Value: STAPHYLOCOCCUS AUREUS     Note: SUSCEPTIBILITIES PERFORMED ON PREVIOUS CULTURE WITHIN THE LAST 5 DAYS. CRITICAL RESULT CALLED TO, READ BACK BY AND VERIFIED WITH: MANDY ROLLS 02/10/12 @ 9:03PM BY RUSCA.     Note: Gram Stain Report Called to,Read Back By and Verified With: MEELY RICHARDSON 02/09/12 @ 2:26PM BY RUSCA.   Report Status 02/11/2012 FINAL   Final   URINE CULTURE     Status: Normal   Collection Time   02/08/12  4:58 PM      Component Value Range Status Comment   Specimen Description URINE, CATHETERIZED   Final    Special Requests NONE   Final    Culture  Setup Time 02/09/2012 01:48  Final    Colony Count NO GROWTH   Final    Culture NO GROWTH   Final    Report Status 02/10/2012 FINAL   Final   CULTURE, EXPECTORATED SPUTUM-ASSESSMENT     Status: Normal   Collection Time   02/08/12  5:54 PM      Component Value Range Status Comment   Specimen Description SPUTUM   Final    Special Requests NONE   Final    Sputum evaluation     Final    Value: THIS SPECIMEN IS ACCEPTABLE. RESPIRATORY CULTURE REPORT TO FOLLOW.   Report Status 02/08/2012 FINAL   Final   MRSA PCR SCREENING     Status: Abnormal   Collection Time   02/08/12  5:54 PM      Component Value Range Status Comment   MRSA by PCR POSITIVE (*) NEGATIVE Final   CULTURE, RESPIRATORY     Status:  Normal   Collection Time   02/08/12  5:54 PM      Component Value Range Status Comment   Specimen Description SPUTUM   Final    Special Requests NONE   Final    Gram Stain     Final    Value: ABUNDANT WBC PRESENT,BOTH PMN AND MONONUCLEAR     RARE SQUAMOUS EPITHELIAL CELLS PRESENT     ABUNDANT GRAM POSITIVE COCCI     IN PAIRS IN CLUSTERS   Culture     Final    Value: ABUNDANT METHICILLIN RESISTANT STAPHYLOCOCCUS AUREUS     Note: RIFAMPIN AND GENTAMICIN SHOULD NOT BE USED AS SINGLE DRUGS FOR TREATMENT OF STAPH INFECTIONS. This organism DOES NOT demonstrate inducible Clindamycin resistance in vitro.   Report Status 02/12/2012 FINAL   Final    Organism ID, Bacteria METHICILLIN RESISTANT STAPHYLOCOCCUS AUREUS   Final   CULTURE, BLOOD (ROUTINE X 2)     Status: Normal (Preliminary result)   Collection Time   02/09/12  7:50 PM      Component Value Range Status Comment   Specimen Description Blood   Final    Special Requests NONE   Final    Culture  Setup Time 02/10/2012 02:13   Final    Culture     Final    Value:        BLOOD CULTURE RECEIVED NO GROWTH TO DATE CULTURE WILL BE HELD FOR 5 DAYS BEFORE ISSUING A FINAL NEGATIVE REPORT   Report Status PENDING   Incomplete   CULTURE, BLOOD (ROUTINE X 2)     Status: Normal (Preliminary result)   Collection Time   02/09/12  7:55 PM      Component Value Range Status Comment   Specimen Description Blood   Final    Special Requests NONE   Final    Culture  Setup Time 02/10/2012 02:13   Final    Culture     Final    Value:        BLOOD CULTURE RECEIVED NO GROWTH TO DATE CULTURE WILL BE HELD FOR 5 DAYS BEFORE ISSUING A FINAL NEGATIVE REPORT   Report Status PENDING   Incomplete   ANAEROBIC CULTURE     Status: Normal (Preliminary result)   Collection Time   02/10/12  4:32 PM      Component Value Range Status Comment   Specimen Description ABSCESS LEFT CHEST   Final    Special Requests PATIENT ON FOLLOWING ANCEF VANC   Final    Gram Stain PENDING  Incomplete    Culture     Final    Value: NO ANAEROBES ISOLATED; CULTURE IN PROGRESS FOR 5 DAYS   Report Status PENDING   Incomplete   GRAM STAIN     Status: Normal   Collection Time   02/10/12  4:32 PM      Component Value Range Status Comment   Specimen Description ABSCESS LEFT CHEST   Final    Special Requests PATIENT ON FOLLOWING ANCEF VANC   Final    Gram Stain     Final    Value: ABUNDANT WBC PRESENT, PREDOMINANTLY PMN     ABUNDANT GRAM POSITIVE COCCI IN CLUSTERS     Gram Stain Report Called to,Read Back By and Verified With: P.WEATHERLY,RN 02/10/12 1725 EHOWARD   Report Status 02/10/2012 FINAL   Final   CULTURE, ROUTINE-ABSCESS     Status: Normal   Collection Time   02/10/12  4:34 PM      Component Value Range Status Comment   Specimen Description ABSCESS LEFT CHEST   Final    Special Requests PATIENT ON FOLLOWING VANC ANCEF   Final    Gram Stain     Final    Value: ABUNDANT WBC PRESENT, PREDOMINANTLY PMN     NO SQUAMOUS EPITHELIAL CELLS SEEN     ABUNDANT GRAM POSITIVE COCCI IN CLUSTERS     Gram Stain Report Called to,Read Back By and Verified With: Gram Stain Report Called to,Read Back By and Verified With: P WEATHERLY RN 02/10/12 1725 BY [id number] Performed at [facility]   Culture     Final    Value: ABUNDANT METHICILLIN RESISTANT STAPHYLOCOCCUS AUREUS     Note: RIFAMPIN AND GENTAMICIN SHOULD NOT BE USED AS SINGLE DRUGS FOR TREATMENT OF STAPH INFECTIONS. This organism DOES NOT demonstrate inducible Clindamycin resistance in vitro. CRITICAL RESULT CALLED TO, READ BACK BY AND VERIFIED WITH: SUSAN F 1/22 @      820 BY REAMM   Report Status 02/13/2012 FINAL   Final    Organism ID, Bacteria METHICILLIN RESISTANT STAPHYLOCOCCUS AUREUS   Final   GRAM STAIN     Status: Normal   Collection Time   02/14/12  5:25 PM      Component Value Range Status Comment   Specimen Description ABSCESS NECK LEFT   Final    Special Requests PATIENT ON FOLLOWING VANCOMYCIN   Final    Gram Stain      Final    Value: RARE WBC PRESENT, PREDOMINANTLY PMN     NO ORGANISMS SEEN     Gram Stain Report Called to,Read Back By and Verified With: RN T. HARRELSON 02/14/12 1924 KERAN M.   Report Status 02/14/2012 FINAL   Final   ANAEROBIC CULTURE     Status: Normal (Preliminary result)   Collection Time   02/14/12  5:25 PM      Component Value Range Status Comment   Specimen Description ABSCESS NECK LEFT   Final    Special Requests PATIENT ON FOLLOWING VANCOMYCIN   Final    Gram Stain     Final    Value: RARE WBC PRESENT, PREDOMINANTLY PMN     NO ORGANISMS SEEN     Gram Stain Report Called to,Read Back By and Verified With: Gram Stain Report Called to,Read Back By and Verified With: RN T HARRELSON @18 :24 ON 02/14/12 BY Sallyanne Kuster M. Performed at [facility]   Culture PENDING   Incomplete    Report Status PENDING  Incomplete   CULTURE, ROUTINE-ABSCESS     Status: Normal (Preliminary result)   Collection Time   02/14/12  5:25 PM      Component Value Range Status Comment   Specimen Description ABSCESS NECK LEFT   Final    Special Requests PATIENT ON FOLLOWING VANCOMYCIN   Final    Gram Stain     Final    Value: RARE WBC PRESENT, PREDOMINANTLY PMN     NO ORGANISMS SEEN     Gram Stain Report Called to,Read Back By and Verified With: Gram Stain Report Called to,Read Back By and Verified With: RN T. HARRELSON @19 :24 ON 02/14/12 BY Sallyanne Kuster M. Performed at [facility]   Culture NO GROWTH   Final    Report Status PENDING   Incomplete     Anti-infectives     Start     Dose/Rate Route Frequency Ordered Stop   02/15/12 1130   gentamicin (GARAMYCIN) IVPB 100 mg        100 mg 200 mL/hr over 30 Minutes Intravenous Every 8 hours 02/15/12 1037     02/14/12 1654   bacitracin 50,000 Units in sodium chloride irrigation 0.9 % 500 mL irrigation  Status:  Discontinued          As needed 02/14/12 1706 02/14/12 1822   02/12/12 2000   vancomycin (VANCOCIN) 1,250 mg in sodium chloride 0.9 % 250 mL IVPB          1,250 mg 166.7 mL/hr over 90 Minutes Intravenous Every 8 hours 02/12/12 1333     02/11/12 1100   vancomycin (VANCOCIN) IVPB 1000 mg/200 mL premix  Status:  Discontinued        1,000 mg 200 mL/hr over 60 Minutes Intravenous Every 8 hours 02/11/12 1042 02/12/12 1332   02/11/12 0045   vancomycin (VANCOCIN) IVPB 1000 mg/200 mL premix  Status:  Discontinued     Comments: CONTINUE TILL FURTHER NOTICE      1,000 mg 200 mL/hr over 60 Minutes Intravenous Every 12 hours 02/10/12 1803 02/11/12 1041   02/10/12 2200   ceFAZolin (ANCEF) IVPB 2 g/50 mL premix  Status:  Discontinued        2 g 100 mL/hr over 30 Minutes Intravenous 3 times per day 02/10/12 1610 02/10/12 1755   02/10/12 2200   piperacillin-tazobactam (ZOSYN) IVPB 3.375 g  Status:  Discontinued        3.375 g 12.5 mL/hr over 240 Minutes Intravenous 3 times per day 02/10/12 1834 02/11/12 1130   02/10/12 1400   vancomycin (VANCOCIN) IVPB 1000 mg/200 mL premix  Status:  Discontinued        1,000 mg 200 mL/hr over 60 Minutes Intravenous Every 8 hours 02/10/12 1111 02/10/12 1755   02/10/12 1200   cefTRIAXone (ROCEPHIN) 1 g in dextrose 5 % 50 mL IVPB  Status:  Discontinued        1 g 100 mL/hr over 30 Minutes Intravenous Every 12 hours 02/10/12 1108 02/10/12 1610   02/09/12 1800   vancomycin (VANCOCIN) IVPB 1000 mg/200 mL premix  Status:  Discontinued        1,000 mg 200 mL/hr over 60 Minutes Intravenous Every 12 hours 02/09/12 1318 02/10/12 1111   02/09/12 0600   vancomycin (VANCOCIN) 1,250 mg in sodium chloride 0.9 % 250 mL IVPB  Status:  Discontinued        1,250 mg 166.7 mL/hr over 90 Minutes Intravenous Every 24 hours 02/08/12 1720 02/09/12 1318   02/08/12 2200  piperacillin-tazobactam (ZOSYN) IVPB 3.375 g  Status:  Discontinued        3.375 g 12.5 mL/hr over 240 Minutes Intravenous 3 times per day 02/08/12 1649 02/10/12 1108   02/08/12 2000   oseltamivir (TAMIFLU) capsule 75 mg  Status:  Discontinued        75 mg Oral 2  times daily 02/08/12 1720 02/10/12 1108   02/08/12 1700   vancomycin (VANCOCIN) IVPB 1000 mg/200 mL premix  Status:  Discontinued        1,000 mg 200 mL/hr over 60 Minutes Intravenous Every 12 hours 02/08/12 1649 02/08/12 1720   02/08/12 1530   vancomycin (VANCOCIN) IVPB 1000 mg/200 mL premix        1,000 mg 200 mL/hr over 60 Minutes Intravenous  Once 02/08/12 1527 02/08/12 2000   02/08/12 1530  piperacillin-tazobactam (ZOSYN) IVPB 3.375 g       3.375 g 12.5 mL/hr over 240 Minutes Intravenous  Once 02/08/12 1527 02/08/12 2004          Assessment: Patient is a  52 y.o M with respiratory and blood cultures positive for MRSA.  Pt had acute quadriplegia yesterday likely due to spinal infection. Adding gent for staph syngergy. Since he is also on vanc, gent will have added toxicity so I will be a little less aggressive with dosing.   Goal of Therapy:  Gent peak ~3-4, trough <1  Plan:  1. Gent 80mg  IV q8 2. Get trough 1/25

## 2012-02-15 NOTE — Progress Notes (Addendum)
NUTRITION FOLLOW UP  Intervention:    Resume Oxepa formula at 15 ml/hr and increase by 10 ml in 4 hours to goal rate of 25 ml/hr with Prostat liquid protein 30 ml 6 times daily to provide 1500 kcals, 128 gm protein, 471 ml of free water ---> rest of kcals being met with Propofol infusion  Liquid MVI daily via tube RD to follow for nutrition care plan  Nutrition Dx:   Inadequate oral intake related to inability to eat as evidenced by NPO status, ongoing  Goal:   EN to meet >/= 90% of estimated nutrition needs, currently unmet  Monitor:   EN regimen & tolerance, respiratory status, weight, labs, I/O's  Assessment:   Patient remains intubated on ventilator support MV: 7.8 Temp: 38.9 Propofol: 23.7 ml/hr ---> 625 fat kcals   Patient s/p procedure 1/23: POSTERIOR CERVICAL LAMINECTOMY FOR EPIDURAL ABSCESS  Patient s/p procedures 1/19:  IRRIGATION AND DEBRIDEMENT ABSCESS (Left)  MINI/LIMITED THORACOTOMY (Left)  EN regimen with Oxepa formula not resumed yet post procedure; per RN to re-start tomorrow.  Height: Ht Readings from Last 1 Encounters:  02/10/12 5\' 10"  (1.778 m)    Weight Status:   Wt Readings from Last 1 Encounters:  02/15/12 189 lb 13.1 oz (86.1 kg)    Re-estimated needs:  Kcal: 2200-2300 Protein: 130-140 gm Fluid: 2.2-2.3 L  Skin: chest wound VAC  Diet Order: NPO   Intake/Output Summary (Last 24 hours) at 02/15/12 1441 Last data filed at 02/15/12 1400  Gross per 24 hour  Intake 5769.7 ml  Output   2959 ml  Net 2810.7 ml    Labs:   Lab 02/15/12 0450 02/14/12 0429 02/13/12 0422 02/12/12 0400 02/09/12 0335  NA 146* 145 144 -- --  K 4.5 4.1 3.9 -- --  CL 111 112 110 -- --  CO2 29 28 26  -- --  BUN 18 21 21  -- --  CREATININE 0.68 0.75 0.70 -- --  CALCIUM 8.2* 8.1* 8.3* -- --  MG 2.3 -- -- 2.1 2.5  PHOS 3.0 -- -- 2.9 4.1  GLUCOSE 134* 148* 125* -- --    CBG (last 3)   Basename 02/15/12 1149 02/15/12 0745 02/15/12 0443  GLUCAP 133* 110*  125*    Scheduled Meds:   . sodium chloride   Intravenous Once  . ipratropium  0.5 mg Nebulization Q6H   And  . albuterol  2.5 mg Nebulization Q6H  . antiseptic oral rinse  1 application Mouth Rinse QID  . bisacodyl  10 mg Oral Daily  . chlorhexidine  15 mL Mouth Rinse BID  . feeding supplement  30 mL Per Tube QID  . folic acid  1 mg Intravenous Daily  . gentamicin  80 mg Intravenous Q8H  . insulin aspart  2-6 Units Subcutaneous Q4H  . pantoprazole sodium  40 mg Per Tube Daily  . sodium chloride  10-40 mL Intracatheter Q12H  . sodium chloride  3 mL Intravenous Q12H  . thiamine  100 mg Intravenous Daily  . vancomycin  1,250 mg Intravenous Q8H    Continuous Infusions:   . sodium chloride    . dextrose 5 % and 0.45 % NaCl with KCl 20 mEq/L 75 mL/hr at 02/15/12 1400  . feeding supplement (OXEPA) 1,000 mL (02/12/12 2348)  . fentaNYL infusion INTRAVENOUS 350 mcg/hr (02/15/12 1400)  . lactated ringers 20 mL/hr at 02/15/12 1400  . propofol 45.091 mcg/kg/min (02/15/12 1400)    Maureen Chatters, RD, LDN Pager #: [phone] After-Hours Pager #:  [phone]     

## 2012-02-15 NOTE — Progress Notes (Signed)
PS increased to 10.  

## 2012-02-15 NOTE — Plan of Care (Signed)
Problem: Problem: Skin/Wound Progression Goal: ADEQUATE MOBILITY PROGRESSION Outcome: Not Met (add Reason) Inappropriate to progress at this point

## 2012-02-15 NOTE — Progress Notes (Signed)
02/15/2012 Cipriano Mile OTR/L Pager [phone] Office [phone]

## 2012-02-15 NOTE — Progress Notes (Signed)
PCCM Progress Note  Pt profile: 11M with recently discovered LUL/chest wall mass, admitted 1/17 with severe chest wall pain and hypoxemia, suspected PNA.  Further history obtained 1/19: Pt developed  Small lesion on middle finger of L hand around 1/03. Developed severe upper L chest pain 1/08 and seen by primary MD 1/09. CXR normal @ that time. 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 Seen by Onc 1/15 and plans for eval of presumed lung cancer. Admitted via ED 1/17 with severe chest pain, hypoxemia, presumed PNA and continued presumption of LUL malignancy. Blood cultures drawn, abx started. Blood cx's turned positive on 1/18.   Studies/Events: 1/13 CT chest: Left upper lobe soft tissue mass anteriorly abutting the pleura  of 7.3 x 4.4 x 5.0 cm. There does appear to be surrounding infiltrate. This is worrisome for primary lung carcinoma. A portion of this soft tissue mass extends through the left anterior chest wall and involves the left pectoralis musculature. Multiple bilateral nodules worrisome for mets 1/19 concern for chest wall abscess rather than malignancy. Transferred to [facility] for TCTS eval 1/19 Irrigation and debridement L chest wall abscess, mini-thoracotomy, placement of VAC and L chest tube Tyrone Sage) 1/20 TTE >> normal LV fxn but grade 2 diastolic dysfxn, mild L atrial dilation, poor view of AV, no evidence MR 1/23 To OR for C2-C7 laminectomies for cord edema and compression 1/23 TEE >> no vegetations, no atrial thrombus  Lines, Tubes, etc: ETT 1/19 >>  L IJ CVL 1/19 >>  R radial A-line 1/19 >>   Microbiology: MRSA PCR 1/17 >> POSITIVE Resp 1/17 >> MRSA Urine 1/17 >> negative Blood 1/17 >> 2/2  MRSA  Blood 1/18 >>  Abscess 1/19 >> MRSA  Antibiotics:  Osletamivir 1/17 >> 1/19 Zosyn 1/17 >> 1/20 Vanc 1/17 >>   Consults:  TCTS ID Neuro NSGY  Best Practice: DVT: LMWH 1/18 SUP:  N/I Nutrition: Reg diet Glycemic control: N/I  Sedation/analgesia: propofol, fentanyl  Subj To OR 1/23 for urgent C2-C7 laminectomies for cord edema and compression Remains paraplegic  Obj: Filed Vitals:   02/15/12 0900  BP:   Pulse: 106  Temp:   Resp: 15    Intake/Output Summary (Last 24 hours) at 02/15/12 0924 Last data filed at 02/15/12 0800  Gross per 24 hour  Intake 4443.5 ml  Output   2639 ml  Net 1804.5 ml   Gen: chronically ill appearing HEENT: WNL Neck: Fullness in L side of neck and supraclav region. No discrete nodes Chest: L upper chest wound,  Diffuse scattered rhonchi Cardiac: tachy, reg, no M Abd: soft, NT, NABS Ext: warm, no edema, erythematous L middle finger with mild L forearm erythema Neuro: R 4mm, L 3mm and react, unable to move either UE or LE, sensation is intact. Moves head and neck, clearly follows commands  BMET  Lab 02/15/12 0450 02/14/12 0429 02/13/12 0422 02/12/12 0400 02/11/12 0359 02/09/12 0335  NA 146* 145 144 138 132* --  K 4.5 4.1 -- -- -- --  CL 111 112 110 104 100 --  CO2 29 28 26 24 24  --  GLUCOSE 134* 148* 125* 116* 121* --  BUN 18 21 21 21  29* --  CREATININE 0.68 0.75 0.70 0.78 0.89 --  CALCIUM 8.2* 8.1* 8.3* 8.2* 7.7* --  MG 2.3 -- -- 2.1 -- 2.5  PHOS 3.0 -- -- 2.9 -- 4.1    CBC  Lab 02/15/12 0450  02/14/12 0429 02/13/12 0422  HGB 7.9* 8.5* 9.0*  HCT 25.8* 26.4* 27.0*  WBC 21.8* 26.8* 27.2*  PLT 574* 477* 384    Lab 02/15/12 0450 02/13/12 0422 02/11/12 0359 02/09/12 0335  PROCALCITON 0.34 0.75 2.81 9.36    CXR: 1/24 LUL opacity resolving, stable B AS dz esp L base, L chest tube  IMPRESSION: Patient Active Problem List  Diagnosis  . H/O HTN  . H/O hypercholesteremia  . H/O stroke  . Acute respiratory failure  . Hypoxemia  . Pain, chest wall  . Chest wall abscess  . Lung abscess  . Staphylococcus aureus bacteremia with sepsis  . Status post thoracotomy, 1/19 Tyrone Sage  . Paraplegia  . DVT of upper  extremity (deep vein thrombosis)  . Quadriplegia    PLAN/RECS:  - paraspinal abscess vs edema from acute infarct, s/p laminectomies, appreciate Dr Fredrich Birks help - LUE DVT noted 1/22; will hold restart of heparin gtt until at least 1/25. Will confirm with Dr Venetia Maxon that this is acceptable before starting - Further debridement/dressing changes at bedside, next 1/23  - Vanco per ID recs, TEE negative - Blood cx from 1/18 (on abx) pending (negative so far). If positive then will change lines. NO PICC placement until we confirm cx's have gone negative - ? Timing for repeat imaging, CT scan chest. Will discuss with Dr Tyrone Sage, depends on clinical course and CXR appearance - Begin WUA's and SBT's - Propofol for sedation - reposition GT and start TF's on 1/25 - Note he will likely be at risk for EtOH withdrawal when we move to extubation; thiamine, folate - Acute renal failure resolved with volume resuscitation - follow BMP  30 minutes CC time   Levy Pupa, MD, PhD 02/15/2012, 9:24 AM [facility] [phone] or if no answer [phone]

## 2012-02-15 NOTE — Progress Notes (Signed)
Patient ID: Frank Brock, male   DOB: 12/11/1960, 52 y.o.   MRN: [id number] TCTS DAILY PROGRESS NOTE                   [address]            [place] [id number]          [phone]      1 Day Post-Op Procedure(s) (LRB): POSTERIOR CERVICAL LAMINECTOMY FOR EPIDURAL ABSCESS (N/A)  Total Length of Stay:  LOS: 7 days   Subjective: Sedated on vent   Objective: Vital signs in last 24 hours: Temp:  [100.4 F (38 C)-101.8 F (38.8 C)] 101.8 F (38.8 C) (01/24 0749) Pulse Rate:  [93-112] 99  (01/24 0600) Cardiac Rhythm:  [-] Sinus tachycardia (01/24 0000) Resp:  [14-28] 15  (01/24 0600) BP: (121-161)/(59-81) 121/59 mmHg (01/23 2100) SpO2:  [91 %-99 %] 96 % (01/24 0600) Arterial Line BP: (119-214)/(41-89) 129/48 mmHg (01/24 0600) FiO2 (%):  [30 %-50 %] 40 % (01/24 0318) Weight:  [189 lb 13.1 oz (86.1 kg)] 189 lb 13.1 oz (86.1 kg) (01/24 0400)  Filed Weights   02/13/12 0500 02/14/12 0438 02/15/12 0400  Weight: 186 lb 4.6 oz (84.5 kg) 192 lb 7.4 oz (87.3 kg) 189 lb 13.1 oz (86.1 kg)    Weight change: -2 lb 10.3 oz (-1.2 kg)   Hemodynamic parameters for last 24 hours:    Intake/Output from previous day: 01/23 0701 - 01/24 0700 In: 3827.4 [I.V.:3327.4; IV Piggyback:500] Out: 2614 [Urine:2350; Drains:160; Blood:100; Chest Tube:4]  Intake/Output this shift:    Current Meds: Scheduled Meds:   . sodium chloride   Intravenous Once  . ipratropium  0.5 mg Nebulization Q6H   And  . albuterol  2.5 mg Nebulization Q6H  . antiseptic oral rinse  1 application Mouth Rinse QID  . bisacodyl  10 mg Oral Daily  . chlorhexidine  15 mL Mouth Rinse BID  . feeding supplement  30 mL Per Tube QID  . folic acid  1 mg Intravenous Daily  . insulin aspart  2-6 Units Subcutaneous Q4H  . pantoprazole sodium  40 mg Per Tube Daily  . sodium chloride  10-40 mL Intracatheter Q12H  . sodium chloride  3 mL Intravenous Q12H  . thiamine  100 mg Intravenous Daily  . vancomycin   1,250 mg Intravenous Q8H   Continuous Infusions:   . sodium chloride    . dextrose 5 % and 0.45 % NaCl with KCl 20 mEq/L 75 mL/hr at 02/14/12 2100  . feeding supplement (OXEPA) 1,000 mL (02/12/12 2348)  . fentaNYL infusion INTRAVENOUS 300 mcg/hr (02/15/12 0622)  . lactated ringers 20 mL/hr at 02/14/12 1050  . propofol 45 mcg/kg/min (02/15/12 0710)   PRN Meds:.acetaminophen, acetaminophen, albuterol, fentaNYL, hydrALAZINE, HYDROmorphone (DILAUDID) injection, menthol-cetylpyridinium, meperidine (DEMEROL) injection, metoprolol, ondansetron (ZOFRAN) IV, phenol, potassium chloride, sodium chloride, sodium chloride  Sedated on vent, will change  Vac  today     Lab Results: CBC: Basename 02/15/12 0450 02/14/12 0429  WBC 21.8* 26.8*  HGB 7.9* 8.5*  HCT 25.8* 26.4*  PLT 574* 477*   BMET:  Basename 02/15/12 0450 02/14/12 0429  NA 146* 145  K 4.5 4.1  CL 111 112  CO2 29 28  GLUCOSE 134* 148*  BUN 18 21  CREATININE 0.68 0.75  CALCIUM 8.2* 8.1*    PT/INR: No results found for this basename: LABPROT,INR in the last 72 hours Radiology: Dg Cervical Spine 1 View  02/14/2012  *  RADIOLOGY REPORT*  Clinical Data: Cervical laminectomy.  DG CERVICAL SPINE - 1 VIEW  Comparison: MR cervical spine 02/14/2012.  Findings: Single intraoperative view of the cervical spine in the lateral projection is provided.  Probes are identified at the level of the C2 and C3 spinous processes.  IMPRESSION: Localization as above.   Original Report Authenticated By: Holley Dexter, M.D.    Mr Laqueta Jean Wo Contrast  02/14/2012  **ADDENDUM** CREATED: 02/14/2012 15:14:30  Per discussion with Dr. Venetia Maxon 02/13/2022  3:15 p.m., possibility of cord infarct causing or contributing to the cervical cord / upper thoracic cord signal abnormality was discussed.  It is difficult to determine whether the cord signal abnormality is from compression or infarct.  **END ADDENDUM** SIGNED BY: Almedia Balls. Constance Goltz, M.D.   02/14/2012  *RADIOLOGY  REPORT*  Clinical Data:  Staph Aureus septicemia.  Multiple lung cavitary lesions.  Left upper extremity deep venous thrombosis.  Development of paralysis.  The patient was scanned while on a ventilator.  The present examination was tailored to complete the exam in  the most expedient fashion given the limitation of the time the ventilator would function within the MR room.  MRI HEAD WITHOUT AND WITH CONTRAST  Technique:  Multiplanar, multiecho pulse sequences of the brain and surrounding structures were obtained without and with intravenous contrast.  Contrast: 20mL MULTIHANCE GADOBENATE DIMEGLUMINE 529 MG/ML IV SOLN 20 ml MultiHance.  Comparison:  No comparison MR.  Findings:  No acute infarct.  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  Scattered small vessel disease type changes.  No intracranial hemorrhage.  No hydrocephalus.  Pulsation artifacts extend through the brain without discrete area of abnormal enhancement.  No intracranial mass identified.  Major intracranial vascular structures are patent.  Paranasal sinus opacification fairly significant involving the ethmoid sinus air cells and maxillary sinuses. Mastoid air cell opacification bilaterally.  Orbital structures grossly intact.  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  No acute infarct.  No definitive findings of abnormal intracranial enhancement.  Prominent paranasal sinus and mastoid air cell opacification.  MRI CERVICAL, THORACIC AND LUMBAR SPINE WITHOUT AND WITH CONTRAST  Technique:  Multiplanar and multiecho pulse sequences of the cervical spine, to include the craniocervical junction and cervicothoracic junction, and thoracic and lumbar spine, were obtained without and with intravenous contrast.  MRI CERVICAL SPINE  Findings:  Markedly abnormal examination.  Altered signal intensity involving portions of C2-T1 vertebral. Altered signal  intensity of the surrounding soft tissue/musculature.  Heterogeneous epidural enhancement throughout the cervical spine and extending into the upper thoracic spine. Within this epidural thickenning and enhancement, there are small areas which do not enhance.  Findings are highly suspicious for infection which may have track from the chest into the cervical canal and upper thoracic canal causing epidural phlegmon/epidural abscesses which when combined with cervical spondylotic changes is causing cord compression.  Within the compressed cord, there is altered signal intensity which extends from C2 through T1.  The appearance is suggestive of edema from the cord compression.  Other causes of cord signal abnormality such as that related to infectious transverse myelitis or post infection demyelinating process felt to be secondary less likely considerations which can be considered after mass effect upon the cord has been addressed.  Vertebral arteries remain patent.  Prominent pooling of secretions in this patient is intubated.  Cervical spondylotic changes include:  C2-3:  Small broad-based protrusion most notable left paracentral position.  C3-4:  Broad-based protrusion/osteophyte.  Uncinate hypertrophy. Kyphosis centered at this level.  C4-5:  Broad-based disc osteophyte greater to the right.  Uncinate bony overgrowth with moderate to marked bilateral foraminal narrowing.  C5-6:  Broad-based protrusion/osteophyte.  Uncinate hypertrophy. Moderate to marked right-sided and mild to moderate left-sided foraminal narrowing.  C6-7:  No significant disc disease.  C7-T1:  Mild facet joint degenerative changes.  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  Blood as cause of epidural process felt to be secondary less likely consideration.  MRI THORACIC SPINE  Findings:  Ventral epidural infection and altered cord signal intensity ends at the T1 leve. In the remainder of the thoracic spine, there is mild enhancement along the periphery of the cord which may represent prominent vasculature or subtle spread of infection but without findings of cord compression.  T7-8:  Small right paracentral disc protrusion.  Minimal cord contact.  T8-9:  Shallow protrusion.  Altered signal intensity vertebra may reflect underlying changes of anemia.  Significant lung parenchymal changes.  Please see recent CT report.  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 In the remainder of the thoracic spine, there is mild enhancement along the periphery of the cord which may represent prominent vasculature or subtle spread of infection but without findings of cord compression caused by infection.  Please see above.  MRI LUMBAR SPINE  Findings: Conus upper L1 level.  There may be a subtle enhancement nerve roots indicating spread of infection however, no evidence of spinal stenosis or nerve root compression caused by infection.  L2-3:  Mild facet joint degenerative changes.  L3-4:  Mild facet joint degenerative changes.  L4-5:  Facet joint degenerative changes.  Mild bulge.  L5-S1:  Minimal facet joint degenerative changes.  Bilateral renal lesions some of which can be confirmed as cysts whereas others are too small to characterize.  Altered signal intensity bone marrow may reflect changes of underlying anemia.  IMPRESSION: There may be a subtle enhancement nerve roots indicating spread of infection however, no evidence of spinal stenosis or nerve root compression caused by infection.  Critical Value/emergent results were called by telephone at the time of interpretation on 02/14/2012  at 1:40 p.m. to Dr. Amada Jupiter, who verbally acknowledged these results. Neurosurgical consultation recommended.  Original Report Authenticated By: Lacy Duverney, M.D.    Mr  Cervical Spine W Wo Contrast  02/14/2012  **ADDENDUM** CREATED: 02/14/2012 15:14:30  Per discussion with Dr. Venetia Maxon 02/13/2022  3:15 p.m., possibility of cord infarct causing or contributing to the cervical cord / upper thoracic cord signal abnormality was discussed.  It is difficult to determine whether the cord signal abnormality is from compression or infarct.  **END ADDENDUM** SIGNED BY: Almedia Balls. Constance Goltz, M.D.   02/14/2012  *RADIOLOGY REPORT*  Clinical Data:  Staph Aureus septicemia.  Multiple lung cavitary lesions.  Left upper extremity deep venous thrombosis.  Development of paralysis.  The patient was scanned while on a ventilator.  The present examination was tailored to complete the exam in  the most expedient fashion given the limitation of the time the ventilator would function within the MR room.  MRI HEAD WITHOUT AND WITH CONTRAST  Technique:  Multiplanar, multiecho pulse sequences of the brain and surrounding structures were obtained without and with intravenous contrast.  Contrast: 20mL MULTIHANCE GADOBENATE DIMEGLUMINE 529 MG/ML IV SOLN 20  ml MultiHance.  Comparison:  No comparison MR.  Findings:  No acute infarct.  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  Scattered small vessel disease type changes.  No intracranial hemorrhage.  No hydrocephalus.  Pulsation artifacts extend through the brain without discrete area of abnormal enhancement.  No intracranial mass identified.  Major intracranial vascular structures are patent.  Paranasal sinus opacification fairly significant involving the ethmoid sinus air cells and maxillary sinuses. Mastoid air cell opacification bilaterally.  Orbital structures grossly intact.  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  No acute infarct.  No definitive findings of abnormal intracranial enhancement.  Prominent paranasal sinus and mastoid air cell opacification.   MRI CERVICAL, THORACIC AND LUMBAR SPINE WITHOUT AND WITH CONTRAST  Technique:  Multiplanar and multiecho pulse sequences of the cervical spine, to include the craniocervical junction and cervicothoracic junction, and thoracic and lumbar spine, were obtained without and with intravenous contrast.  MRI CERVICAL SPINE  Findings:  Markedly abnormal examination.  Altered signal intensity involving portions of C2-T1 vertebral. Altered signal intensity of the surrounding soft tissue/musculature.  Heterogeneous epidural enhancement throughout the cervical spine and extending into the upper thoracic spine. Within this epidural thickenning and enhancement, there are small areas which do not enhance.  Findings are highly suspicious for infection which may have track from the chest into the cervical canal and upper thoracic canal causing epidural phlegmon/epidural abscesses which when combined with cervical spondylotic changes is causing cord compression.  Within the compressed cord, there is altered signal intensity which extends from C2 through T1.  The appearance is suggestive of edema from the cord compression.  Other causes of cord signal abnormality such as that related to infectious transverse myelitis or post infection demyelinating process felt to be secondary less likely considerations which can be considered after mass effect upon the cord has been addressed.  Vertebral arteries remain patent.  Prominent pooling of secretions in this patient is intubated.  Cervical spondylotic changes include:  C2-3:  Small broad-based protrusion most notable left paracentral position.  C3-4:  Broad-based protrusion/osteophyte.  Uncinate hypertrophy. Kyphosis centered at this level.  C4-5:  Broad-based disc osteophyte greater to the right.  Uncinate bony overgrowth with moderate to marked bilateral foraminal narrowing.  C5-6:  Broad-based protrusion/osteophyte.  Uncinate hypertrophy. Moderate to marked right-sided and mild to  moderate left-sided foraminal narrowing.  C6-7:  No significant disc disease.  C7-T1:  Mild facet joint degenerative changes.  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  Blood as cause of epidural process felt to be secondary less likely consideration.  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 In the remainder of the thoracic spine, there is mild enhancement along the periphery of the cord which may represent prominent vasculature or subtle spread of infection but without findings of cord compression.  T7-8:  Small right paracentral disc protrusion.  Minimal cord contact.  T8-9:  Shallow protrusion.  Altered signal intensity vertebra may reflect underlying changes of anemia.  Significant lung parenchymal changes.  Please see recent CT report.  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 In the remainder of the thoracic spine, there is mild enhancement along the periphery of the cord which may represent prominent  vasculature or subtle spread of infection but without findings of cord compression caused by infection.  Please see above.  MRI LUMBAR SPINE  Findings: Conus upper L1 level.  There may be a subtle enhancement nerve roots indicating spread of infection however, no evidence of spinal stenosis or nerve root compression caused by infection.  L2-3:  Mild facet joint degenerative changes.  L3-4:  Mild facet joint degenerative changes.  L4-5:  Facet joint degenerative changes.  Mild bulge.  L5-S1:  Minimal facet joint degenerative changes.  Bilateral renal lesions some of which can be confirmed as cysts whereas others are too small to characterize.  Altered signal intensity bone marrow may reflect changes of underlying anemia.   IMPRESSION: There may be a subtle enhancement nerve roots indicating spread of infection however, no evidence of spinal stenosis or nerve root compression caused by infection.  Critical Value/emergent results were called by telephone at the time of interpretation on 02/14/2012  at 1:40 p.m. to Dr. Amada Jupiter, who verbally acknowledged these results. Neurosurgical consultation recommended.  Original Report Authenticated By: Lacy Duverney, M.D.    Mr Thoracic Spine W Wo Contrast  02/14/2012  **ADDENDUM** CREATED: 02/14/2012 15:14:30  Per discussion with Dr. Venetia Maxon 02/13/2022  3:15 p.m., possibility of cord infarct causing or contributing to the cervical cord / upper thoracic cord signal abnormality was discussed.  It is difficult to determine whether the cord signal abnormality is from compression or infarct.  **END ADDENDUM** SIGNED BY: Almedia Balls. Constance Goltz, M.D.   02/14/2012  *RADIOLOGY REPORT*  Clinical Data:  Staph Aureus septicemia.  Multiple lung cavitary lesions.  Left upper extremity deep venous thrombosis.  Development of paralysis.  The patient was scanned while on a ventilator.  The present examination was tailored to complete the exam in  the most expedient fashion given the limitation of the time the ventilator would function within the MR room.  MRI HEAD WITHOUT AND WITH CONTRAST  Technique:  Multiplanar, multiecho pulse sequences of the brain and surrounding structures were obtained without and with intravenous contrast.  Contrast: 20mL MULTIHANCE GADOBENATE DIMEGLUMINE 529 MG/ML IV SOLN 20 ml MultiHance.  Comparison:  No comparison MR.  Findings:  No acute infarct.  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  Scattered small vessel disease type changes.  No intracranial hemorrhage.  No hydrocephalus.  Pulsation artifacts extend through the brain without discrete area of abnormal enhancement.  No intracranial mass identified.  Major  intracranial vascular structures are patent.  Paranasal sinus opacification fairly significant involving the ethmoid sinus air cells and maxillary sinuses. Mastoid air cell opacification bilaterally.  Orbital structures grossly intact.  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  No acute infarct.  No definitive findings of abnormal intracranial enhancement.  Prominent paranasal sinus and mastoid air cell opacification.  MRI CERVICAL, THORACIC AND LUMBAR SPINE WITHOUT AND WITH CONTRAST  Technique:  Multiplanar and multiecho pulse sequences of the cervical spine, to include the craniocervical junction and cervicothoracic junction, and thoracic and lumbar spine, were obtained without and with intravenous contrast.  MRI CERVICAL SPINE  Findings:  Markedly abnormal examination.  Altered signal intensity involving portions of C2-T1 vertebral. Altered signal intensity of the surrounding soft tissue/musculature.  Heterogeneous epidural enhancement throughout the cervical spine and extending into the upper thoracic spine. Within this epidural thickenning and enhancement, there are small areas which do not enhance.  Findings are highly suspicious for infection which may have track from  the chest into the cervical canal and upper thoracic canal causing epidural phlegmon/epidural abscesses which when combined with cervical spondylotic changes is causing cord compression.  Within the compressed cord, there is altered signal intensity which extends from C2 through T1.  The appearance is suggestive of edema from the cord compression.  Other causes of cord signal abnormality such as that related to infectious transverse myelitis or post infection demyelinating process felt to be secondary less likely considerations which can be considered after mass effect upon the cord has been addressed.  Vertebral arteries remain patent.  Prominent pooling of secretions in this patient is intubated.   Cervical spondylotic changes include:  C2-3:  Small broad-based protrusion most notable left paracentral position.  C3-4:  Broad-based protrusion/osteophyte.  Uncinate hypertrophy. Kyphosis centered at this level.  C4-5:  Broad-based disc osteophyte greater to the right.  Uncinate bony overgrowth with moderate to marked bilateral foraminal narrowing.  C5-6:  Broad-based protrusion/osteophyte.  Uncinate hypertrophy. Moderate to marked right-sided and mild to moderate left-sided foraminal narrowing.  C6-7:  No significant disc disease.  C7-T1:  Mild facet joint degenerative changes.  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  Blood as cause of epidural process felt to be secondary less likely consideration.  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 In the remainder of the thoracic spine, there is mild enhancement along the periphery of the cord which may represent prominent vasculature or subtle spread of infection but without findings of cord compression.  T7-8:  Small right paracentral disc protrusion.  Minimal cord contact.  T8-9:  Shallow protrusion.  Altered signal intensity vertebra may reflect underlying changes of anemia.  Significant lung parenchymal changes.  Please see recent CT report.  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 In the remainder of the thoracic spine, there is mild enhancement along the periphery of the cord which may represent prominent vasculature or subtle spread of infection but without findings of cord compression caused by infection.  Please see above.  MRI LUMBAR SPINE  Findings: Conus upper L1 level.  There may be a subtle enhancement nerve roots indicating spread of infection however, no evidence of  spinal stenosis or nerve root compression caused by infection.  L2-3:  Mild facet joint degenerative changes.  L3-4:  Mild facet joint degenerative changes.  L4-5:  Facet joint degenerative changes.  Mild bulge.  L5-S1:  Minimal facet joint degenerative changes.  Bilateral renal lesions some of which can be confirmed as cysts whereas others are too small to characterize.  Altered signal intensity bone marrow may reflect changes of underlying anemia.  IMPRESSION: There may be a subtle enhancement nerve roots indicating spread of infection however, no evidence of spinal stenosis or nerve root compression caused by infection.  Critical Value/emergent results were called by telephone at the time of interpretation on 02/14/2012  at 1:40 p.m. to Dr. Amada Jupiter, who verbally acknowledged these results. Neurosurgical consultation recommended.  Original Report Authenticated By: Lacy Duverney, M.D.    Mr Lumbar Spine W Wo Contrast  02/14/2012  **ADDENDUM** CREATED: 02/14/2012 15:14:30  Per discussion with Dr. Venetia Maxon 02/13/2022  3:15 p.m., possibility of cord infarct causing or contributing to the cervical cord / upper thoracic cord signal abnormality was discussed.  It is difficult to determine whether  the cord signal abnormality is from compression or infarct.  **END ADDENDUM** SIGNED BY: Almedia Balls. Constance Goltz, M.D.   02/14/2012  *RADIOLOGY REPORT*  Clinical Data:  Staph Aureus septicemia.  Multiple lung cavitary lesions.  Left upper extremity deep venous thrombosis.  Development of paralysis.  The patient was scanned while on a ventilator.  The present examination was tailored to complete the exam in  the most expedient fashion given the limitation of the time the ventilator would function within the MR room.  MRI HEAD WITHOUT AND WITH CONTRAST  Technique:  Multiplanar, multiecho pulse sequences of the brain and surrounding structures were obtained without and with intravenous contrast.  Contrast: 20mL MULTIHANCE GADOBENATE  DIMEGLUMINE 529 MG/ML IV SOLN 20 ml MultiHance.  Comparison:  No comparison MR.  Findings:  No acute infarct.  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  Scattered small vessel disease type changes.  No intracranial hemorrhage.  No hydrocephalus.  Pulsation artifacts extend through the brain without discrete area of abnormal enhancement.  No intracranial mass identified.  Major intracranial vascular structures are patent.  Paranasal sinus opacification fairly significant involving the ethmoid sinus air cells and maxillary sinuses. Mastoid air cell opacification bilaterally.  Orbital structures grossly intact.  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  No acute infarct.  No definitive findings of abnormal intracranial enhancement.  Prominent paranasal sinus and mastoid air cell opacification.  MRI CERVICAL, THORACIC AND LUMBAR SPINE WITHOUT AND WITH CONTRAST  Technique:  Multiplanar and multiecho pulse sequences of the cervical spine, to include the craniocervical junction and cervicothoracic junction, and thoracic and lumbar spine, were obtained without and with intravenous contrast.  MRI CERVICAL SPINE  Findings:  Markedly abnormal examination.  Altered signal intensity involving portions of C2-T1 vertebral. Altered signal intensity of the surrounding soft tissue/musculature.  Heterogeneous epidural enhancement throughout the cervical spine and extending into the upper thoracic spine. Within this epidural thickenning and enhancement, there are small areas which do not enhance.  Findings are highly suspicious for infection which may have track from the chest into the cervical canal and upper thoracic canal causing epidural phlegmon/epidural abscesses which when combined with cervical spondylotic changes is causing cord compression.  Within the compressed cord, there is altered signal intensity which extends from  C2 through T1.  The appearance is suggestive of edema from the cord compression.  Other causes of cord signal abnormality such as that related to infectious transverse myelitis or post infection demyelinating process felt to be secondary less likely considerations which can be considered after mass effect upon the cord has been addressed.  Vertebral arteries remain patent.  Prominent pooling of secretions in this patient is intubated.  Cervical spondylotic changes include:  C2-3:  Small broad-based protrusion most notable left paracentral position.  C3-4:  Broad-based protrusion/osteophyte.  Uncinate hypertrophy. Kyphosis centered at this level.  C4-5:  Broad-based disc osteophyte greater to the right.  Uncinate bony overgrowth with moderate to marked bilateral foraminal narrowing.  C5-6:  Broad-based protrusion/osteophyte.  Uncinate hypertrophy. Moderate to marked right-sided and mild to moderate left-sided foraminal narrowing.  C6-7:  No significant disc disease.  C7-T1:  Mild facet joint degenerative changes.  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  Blood as cause of epidural process felt to be secondary less likely consideration.  MRI THORACIC  SPINE  Findings: Ventral epidural infection and altered cord signal intensity ends at the T1 leve. In the remainder of the thoracic spine, there is mild enhancement along the periphery of the cord which may represent prominent vasculature or subtle spread of infection but without findings of cord compression.  T7-8:  Small right paracentral disc protrusion.  Minimal cord contact.  T8-9:  Shallow protrusion.  Altered signal intensity vertebra may reflect underlying changes of anemia.  Significant lung parenchymal changes.  Please see recent CT report.  IMPRESSION: Ventral epidural infection and altered cord  signal intensity ends at the T1 leve. In the remainder of the thoracic spine, there is mild enhancement along the periphery of the cord which may represent prominent vasculature or subtle spread of infection but without findings of cord compression caused by infection.  Please see above.  MRI LUMBAR SPINE  Findings: Conus upper L1 level.  There may be a subtle enhancement nerve roots indicating spread of infection however, no evidence of spinal stenosis or nerve root compression caused by infection.  L2-3:  Mild facet joint degenerative changes.  L3-4:  Mild facet joint degenerative changes.  L4-5:  Facet joint degenerative changes.  Mild bulge.  L5-S1:  Minimal facet joint degenerative changes.  Bilateral renal lesions some of which can be confirmed as cysts whereas others are too small to characterize.  Altered signal intensity bone marrow may reflect changes of underlying anemia.  IMPRESSION: There may be a subtle enhancement nerve roots indicating spread of infection however, no evidence of spinal stenosis or nerve root compression caused by infection.  Critical Value/emergent results were called by telephone at the time of interpretation on 02/14/2012  at 1:40 p.m. to Dr. Amada Jupiter, who verbally acknowledged these results. Neurosurgical consultation recommended.  Original Report Authenticated By: Lacy Duverney, M.D.    Dg Chest Port 1 View  02/14/2012  *RADIOLOGY REPORT*  Clinical Data: Postop from left lung and chest wall abscess.  PORTABLE CHEST - 1 VIEW  Comparison: 02/13/2012  Findings: Support apparatus including left chest tube remain in appropriate position.  No pneumothorax identified.  Asymmetric airspace disease is again seen involving left lung greater than right, without significant change.  Heart size is normal.  IMPRESSION: No significant change in asymmetric bilateral airspace disease.  No pneumothorax identified.   Original Report Authenticated By: Myles Rosenthal, M.D.       Assessment/Plan: S/P Procedure(s) (LRB): POSTERIOR CERVICAL LAMINECTOMY FOR EPIDURAL ABSCESS (N/A) Continue foley due to patient critically ill, patient in ICU and urinary output monitoring Leave ct Change vac   Taiki Buckwalter B 02/15/2012 7:57 AM

## 2012-02-15 NOTE — Progress Notes (Signed)
Subjective/events: Pt underwent decompressive surgery yesterday.   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 If the cord edema is from infarct rather than directly from compression, then etiology is likely compressive+inflamation.  His prognosis in that case for improved motor function would be guarded at best.   Recommendations: 1) At this time, no further recommendations other than PT when patient is able. Neurology will sign off at this tim.   Ritta Slot, MD [facility] [phone]  If 7pm- 7am, please page neurology on call at [phone].

## 2012-02-15 NOTE — Progress Notes (Signed)
Drain output 160 cc.  Continue today until output decreases.

## 2012-02-15 NOTE — Progress Notes (Signed)
PT/OT Cancellation Note  Patient Details Name: Frank Brock MRN: [id number] DOB: Jul 24, 1960   Cancelled Treatment:    Reason Eval/Treat Not Completed: Medical issues which prohibited therapy;Other (comment) (pt intubated, sedated, quad post surgery). Per RN, will let nursing and medical staff manage medically over the weekend and attempt evaluation on Monday 27th, unless order to start over the weekend.  Thanks.  02/15/2012  [place] Bing, PT [phone] (pager)    [name], Eliseo Gum 02/15/2012, 9:41 AM

## 2012-02-15 NOTE — Progress Notes (Signed)
Subjective: Patient reports (sedated, vent)  Objective: Vital signs in last 24 hours: Temp:  [100.4 F (38 C)-101.8 F (38.8 C)] 101.8 F (38.8 C) (01/24 0749) Pulse Rate:  [93-112] 99  (01/24 0600) Resp:  [14-28] 15  (01/24 0600) BP: (121-161)/(59-81) 121/59 mmHg (01/23 2100) SpO2:  [91 %-99 %] 96 % (01/24 0600) Arterial Line BP: (119-214)/(41-89) 129/48 mmHg (01/24 0600) FiO2 (%):  [30 %-50 %] 40 % (01/24 0318) Weight:  [86.1 kg (189 lb 13.1 oz)] 86.1 kg (189 lb 13.1 oz) (01/24 0400)  Intake/Output from previous day: 01/23 0701 - 01/24 0700 In: 3827.4 [I.V.:3327.4; IV Piggyback:500] Out: 2614 [Urine:2350; Drains:160; Blood:100; Chest Tube:4] Intake/Output this shift:    Sedated, on vent. Nursing reports eye opening while sedation light, but no movement in extremities this am. Drsg intact.  Lab Results:  [facility] 02/15/12 0450 02/14/12 0429  WBC 21.8* 26.8*  HGB 7.9* 8.5*  HCT 25.8* 26.4*  PLT 574* 477*   BMET  Basename 02/15/12 0450 02/14/12 0429  NA 146* 145  K 4.5 4.1  CL 111 112  CO2 29 28  GLUCOSE 134* 148*  BUN 18 21  CREATININE 0.68 0.75  CALCIUM 8.2* 8.1*    Studies/Results: Dg Cervical Spine 1 View  02/14/2012  *RADIOLOGY REPORT*  Clinical Data: Cervical laminectomy.  DG CERVICAL SPINE - 1 VIEW  Comparison: MR cervical spine 02/14/2012.  Findings: Single intraoperative view of the cervical spine in the lateral projection is provided.  Probes are identified at the level of the C2 and C3 spinous processes.  IMPRESSION: Localization as above.   Original Report Authenticated By: Holley Dexter, M.D.    Mr Laqueta Jean Wo Contrast  02/14/2012  **ADDENDUM** CREATED: 02/14/2012 15:14:30  Per discussion with Dr. Venetia Maxon 02/13/2022  3:15 p.m., possibility of cord infarct causing or contributing to the cervical cord / upper thoracic cord signal abnormality was discussed.  It is difficult to determine whether the cord signal abnormality is from compression or infarct.   **END ADDENDUM** SIGNED BY: Almedia Balls. Constance Goltz, M.D.   02/14/2012  *RADIOLOGY REPORT*  Clinical Data:  Staph Aureus septicemia.  Multiple lung cavitary lesions.  Left upper extremity deep venous thrombosis.  Development of paralysis.  The patient was scanned while on a ventilator.  The present examination was tailored to complete the exam in  the most expedient fashion given the limitation of the time the ventilator would function within the MR room.  MRI HEAD WITHOUT AND WITH CONTRAST  Technique:  Multiplanar, multiecho pulse sequences of the brain and surrounding structures were obtained without and with intravenous contrast.  Contrast: 20mL MULTIHANCE GADOBENATE DIMEGLUMINE 529 MG/ML IV SOLN 20 ml MultiHance.  Comparison:  No comparison MR.  Findings:  No acute infarct.  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  Scattered small vessel disease type changes.  No intracranial hemorrhage.  No hydrocephalus.  Pulsation artifacts extend through the brain without discrete area of abnormal enhancement.  No intracranial mass identified.  Major intracranial vascular structures are patent.  Paranasal sinus opacification fairly significant involving the ethmoid sinus air cells and maxillary sinuses. Mastoid air cell opacification bilaterally.  Orbital structures grossly intact.  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  No acute infarct.  No definitive findings of abnormal intracranial enhancement.  Prominent paranasal sinus and mastoid air cell opacification.  MRI CERVICAL, THORACIC AND LUMBAR SPINE WITHOUT AND WITH CONTRAST  Technique:  Multiplanar and multiecho pulse  sequences of the cervical spine, to include the craniocervical junction and cervicothoracic junction, and thoracic and lumbar spine, were obtained without and with intravenous contrast.  MRI CERVICAL SPINE  Findings:  Markedly abnormal examination.   Altered signal intensity involving portions of C2-T1 vertebral. Altered signal intensity of the surrounding soft tissue/musculature.  Heterogeneous epidural enhancement throughout the cervical spine and extending into the upper thoracic spine. Within this epidural thickenning and enhancement, there are small areas which do not enhance.  Findings are highly suspicious for infection which may have track from the chest into the cervical canal and upper thoracic canal causing epidural phlegmon/epidural abscesses which when combined with cervical spondylotic changes is causing cord compression.  Within the compressed cord, there is altered signal intensity which extends from C2 through T1.  The appearance is suggestive of edema from the cord compression.  Other causes of cord signal abnormality such as that related to infectious transverse myelitis or post infection demyelinating process felt to be secondary less likely considerations which can be considered after mass effect upon the cord has been addressed.  Vertebral arteries remain patent.  Prominent pooling of secretions in this patient is intubated.  Cervical spondylotic changes include:  C2-3:  Small broad-based protrusion most notable left paracentral position.  C3-4:  Broad-based protrusion/osteophyte.  Uncinate hypertrophy. Kyphosis centered at this level.  C4-5:  Broad-based disc osteophyte greater to the right.  Uncinate bony overgrowth with moderate to marked bilateral foraminal narrowing.  C5-6:  Broad-based protrusion/osteophyte.  Uncinate hypertrophy. Moderate to marked right-sided and mild to moderate left-sided foraminal narrowing.  C6-7:  No significant disc disease.  C7-T1:  Mild facet joint degenerative changes.  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  Blood as cause of epidural process  felt to be secondary less likely consideration.  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 In the remainder of the thoracic spine, there is mild enhancement along the periphery of the cord which may represent prominent vasculature or subtle spread of infection but without findings of cord compression.  T7-8:  Small right paracentral disc protrusion.  Minimal cord contact.  T8-9:  Shallow protrusion.  Altered signal intensity vertebra may reflect underlying changes of anemia.  Significant lung parenchymal changes.  Please see recent CT report.  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 In the remainder of the thoracic spine, there is mild enhancement along the periphery of the cord which may represent prominent vasculature or subtle spread of infection but without findings of cord compression caused by infection.  Please see above.  MRI LUMBAR SPINE  Findings: Conus upper L1 level.  There may be a subtle enhancement nerve roots indicating spread of infection however, no evidence of spinal stenosis or nerve root compression caused by infection.  L2-3:  Mild facet joint degenerative changes.  L3-4:  Mild facet joint degenerative changes.  L4-5:  Facet joint degenerative changes.  Mild bulge.  L5-S1:  Minimal facet joint degenerative changes.  Bilateral renal lesions some of which can be confirmed as cysts whereas others are too small to characterize.  Altered signal intensity bone marrow may reflect changes of underlying anemia.  IMPRESSION: There may be a subtle enhancement nerve roots indicating spread of infection however, no evidence of spinal stenosis or nerve root compression  caused by infection.  Critical Value/emergent results were called by telephone at the time of interpretation on 02/14/2012  at 1:40 p.m. to Dr. Amada Jupiter, who verbally acknowledged these results. Neurosurgical  consultation recommended.  Original Report Authenticated By: Lacy Duverney, M.D.    Mr Cervical Spine W Wo Contrast  02/14/2012  **ADDENDUM** CREATED: 02/14/2012 15:14:30  Per discussion with Dr. Venetia Maxon 02/13/2022  3:15 p.m., possibility of cord infarct causing or contributing to the cervical cord / upper thoracic cord signal abnormality was discussed.  It is difficult to determine whether the cord signal abnormality is from compression or infarct.  **END ADDENDUM** SIGNED BY: Almedia Balls. Constance Goltz, M.D.   02/14/2012  *RADIOLOGY REPORT*  Clinical Data:  Staph Aureus septicemia.  Multiple lung cavitary lesions.  Left upper extremity deep venous thrombosis.  Development of paralysis.  The patient was scanned while on a ventilator.  The present examination was tailored to complete the exam in  the most expedient fashion given the limitation of the time the ventilator would function within the MR room.  MRI HEAD WITHOUT AND WITH CONTRAST  Technique:  Multiplanar, multiecho pulse sequences of the brain and surrounding structures were obtained without and with intravenous contrast.  Contrast: 20mL MULTIHANCE GADOBENATE DIMEGLUMINE 529 MG/ML IV SOLN 20 ml MultiHance.  Comparison:  No comparison MR.  Findings:  No acute infarct.  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  Scattered small vessel disease type changes.  No intracranial hemorrhage.  No hydrocephalus.  Pulsation artifacts extend through the brain without discrete area of abnormal enhancement.  No intracranial mass identified.  Major intracranial vascular structures are patent.  Paranasal sinus opacification fairly significant involving the ethmoid sinus air cells and maxillary sinuses. Mastoid air cell opacification bilaterally.  Orbital structures grossly intact.  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  No acute infarct.  No definitive findings of abnormal  intracranial enhancement.  Prominent paranasal sinus and mastoid air cell opacification.  MRI CERVICAL, THORACIC AND LUMBAR SPINE WITHOUT AND WITH CONTRAST  Technique:  Multiplanar and multiecho pulse sequences of the cervical spine, to include the craniocervical junction and cervicothoracic junction, and thoracic and lumbar spine, were obtained without and with intravenous contrast.  MRI CERVICAL SPINE  Findings:  Markedly abnormal examination.  Altered signal intensity involving portions of C2-T1 vertebral. Altered signal intensity of the surrounding soft tissue/musculature.  Heterogeneous epidural enhancement throughout the cervical spine and extending into the upper thoracic spine. Within this epidural thickenning and enhancement, there are small areas which do not enhance.  Findings are highly suspicious for infection which may have track from the chest into the cervical canal and upper thoracic canal causing epidural phlegmon/epidural abscesses which when combined with cervical spondylotic changes is causing cord compression.  Within the compressed cord, there is altered signal intensity which extends from C2 through T1.  The appearance is suggestive of edema from the cord compression.  Other causes of cord signal abnormality such as that related to infectious transverse myelitis or post infection demyelinating process felt to be secondary less likely considerations which can be considered after mass effect upon the cord has been addressed.  Vertebral arteries remain patent.  Prominent pooling of secretions in this patient is intubated.  Cervical spondylotic changes include:  C2-3:  Small broad-based protrusion most notable left paracentral position.  C3-4:  Broad-based protrusion/osteophyte.  Uncinate hypertrophy. Kyphosis centered at this level.  C4-5:  Broad-based disc osteophyte greater to  the right.  Uncinate bony overgrowth with moderate to marked bilateral foraminal narrowing.  C5-6:  Broad-based  protrusion/osteophyte.  Uncinate hypertrophy. Moderate to marked right-sided and mild to moderate left-sided foraminal narrowing.  C6-7:  No significant disc disease.  C7-T1:  Mild facet joint degenerative changes.  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  Blood as cause of epidural process felt to be secondary less likely consideration.  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 In the remainder of the thoracic spine, there is mild enhancement along the periphery of the cord which may represent prominent vasculature or subtle spread of infection but without findings of cord compression.  T7-8:  Small right paracentral disc protrusion.  Minimal cord contact.  T8-9:  Shallow protrusion.  Altered signal intensity vertebra may reflect underlying changes of anemia.  Significant lung parenchymal changes.  Please see recent CT report.  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 In the remainder of the thoracic spine, there is mild enhancement along the periphery of the cord which may represent prominent vasculature or subtle spread of infection but without findings of cord compression caused by infection.  Please see above.  MRI LUMBAR SPINE  Findings: Conus upper L1 level.  There may be a subtle enhancement nerve roots indicating spread of infection however, no evidence of spinal stenosis or nerve root compression caused by infection.  L2-3:  Mild facet joint degenerative changes.  L3-4:  Mild facet joint degenerative changes.  L4-5:  Facet joint degenerative changes.  Mild bulge.  L5-S1:  Minimal facet joint degenerative changes.  Bilateral renal lesions some of which can be confirmed as cysts whereas others are too small to characterize.   Altered signal intensity bone marrow may reflect changes of underlying anemia.  IMPRESSION: There may be a subtle enhancement nerve roots indicating spread of infection however, no evidence of spinal stenosis or nerve root compression caused by infection.  Critical Value/emergent results were called by telephone at the time of interpretation on 02/14/2012  at 1:40 p.m. to Dr. Amada Jupiter, who verbally acknowledged these results. Neurosurgical consultation recommended.  Original Report Authenticated By: Lacy Duverney, M.D.    Mr Thoracic Spine W Wo Contrast  02/14/2012  **ADDENDUM** CREATED: 02/14/2012 15:14:30  Per discussion with Dr. Venetia Maxon 02/13/2022  3:15 p.m., possibility of cord infarct causing or contributing to the cervical cord / upper thoracic cord signal abnormality was discussed.  It is difficult to determine whether the cord signal abnormality is from compression or infarct.  **END ADDENDUM** SIGNED BY: Almedia Balls. Constance Goltz, M.D.   02/14/2012  *RADIOLOGY REPORT*  Clinical Data:  Staph Aureus septicemia.  Multiple lung cavitary lesions.  Left upper extremity deep venous thrombosis.  Development of paralysis.  The patient was scanned while on a ventilator.  The present examination was tailored to complete the exam in  the most expedient fashion given the limitation of the time the ventilator would function within the MR room.  MRI HEAD WITHOUT AND WITH CONTRAST  Technique:  Multiplanar, multiecho pulse sequences of the brain and surrounding structures were obtained without and with intravenous contrast.  Contrast: 20mL MULTIHANCE GADOBENATE DIMEGLUMINE 529 MG/ML IV SOLN 20 ml MultiHance.  Comparison:  No comparison MR.  Findings:  No acute infarct.  Remote small infarcts basal ganglia/posterior  limb left internal capsule and right periventricular region.  Scattered small vessel disease type changes.  No intracranial hemorrhage.  No hydrocephalus.  Pulsation artifacts extend through the brain without discrete  area of abnormal enhancement.  No intracranial mass identified.  Major intracranial vascular structures are patent.  Paranasal sinus opacification fairly significant involving the ethmoid sinus air cells and maxillary sinuses. Mastoid air cell opacification bilaterally.  Orbital structures grossly intact.  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  No acute infarct.  No definitive findings of abnormal intracranial enhancement.  Prominent paranasal sinus and mastoid air cell opacification.  MRI CERVICAL, THORACIC AND LUMBAR SPINE WITHOUT AND WITH CONTRAST  Technique:  Multiplanar and multiecho pulse sequences of the cervical spine, to include the craniocervical junction and cervicothoracic junction, and thoracic and lumbar spine, were obtained without and with intravenous contrast.  MRI CERVICAL SPINE  Findings:  Markedly abnormal examination.  Altered signal intensity involving portions of C2-T1 vertebral. Altered signal intensity of the surrounding soft tissue/musculature.  Heterogeneous epidural enhancement throughout the cervical spine and extending into the upper thoracic spine. Within this epidural thickenning and enhancement, there are small areas which do not enhance.  Findings are highly suspicious for infection which may have track from the chest into the cervical canal and upper thoracic canal causing epidural phlegmon/epidural abscesses which when combined with cervical spondylotic changes is causing cord compression.  Within the compressed cord, there is altered signal intensity which extends from C2 through T1.  The appearance is suggestive of edema from the cord compression.  Other causes of cord signal abnormality such as that related to infectious transverse myelitis or post infection demyelinating process felt to be secondary less likely considerations which can be considered after mass effect upon the cord has been addressed.  Vertebral arteries remain  patent.  Prominent pooling of secretions in this patient is intubated.  Cervical spondylotic changes include:  C2-3:  Small broad-based protrusion most notable left paracentral position.  C3-4:  Broad-based protrusion/osteophyte.  Uncinate hypertrophy. Kyphosis centered at this level.  C4-5:  Broad-based disc osteophyte greater to the right.  Uncinate bony overgrowth with moderate to marked bilateral foraminal narrowing.  C5-6:  Broad-based protrusion/osteophyte.  Uncinate hypertrophy. Moderate to marked right-sided and mild to moderate left-sided foraminal narrowing.  C6-7:  No significant disc disease.  C7-T1:  Mild facet joint degenerative changes.  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  Blood as cause of epidural process felt to be secondary less likely consideration.  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 In the remainder of the thoracic spine, there is mild enhancement along the periphery of the cord which may represent prominent vasculature or subtle spread of infection but without findings of cord compression.  T7-8:  Small right paracentral disc protrusion.  Minimal cord contact.  T8-9:  Shallow protrusion.  Altered signal intensity vertebra may reflect underlying changes of anemia.  Significant lung parenchymal changes.  Please see recent CT report.  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 In the remainder of the thoracic spine, there is mild enhancement along the periphery of the cord which may represent prominent vasculature or subtle spread of infection but without findings of cord compression caused by infection.  Please see above.  MRI LUMBAR SPINE  Findings: Conus upper L1 level.  There may be a subtle  enhancement nerve roots indicating spread of infection however, no evidence of spinal stenosis or nerve root compression caused by infection.  L2-3:  Mild facet joint degenerative changes.  L3-4:  Mild facet joint degenerative changes.  L4-5:  Facet joint degenerative changes.  Mild bulge.  L5-S1:  Minimal facet joint degenerative changes.  Bilateral renal lesions some of which can be confirmed as cysts whereas others are too small to characterize.  Altered signal intensity bone marrow may reflect changes of underlying anemia.  IMPRESSION: There may be a subtle enhancement nerve roots indicating spread of infection however, no evidence of spinal stenosis or nerve root compression caused by infection.  Critical Value/emergent results were called by telephone at the time of interpretation on 02/14/2012  at 1:40 p.m. to Dr. Amada Jupiter, who verbally acknowledged these results. Neurosurgical consultation recommended.  Original Report Authenticated By: Lacy Duverney, M.D.    Mr Lumbar Spine W Wo Contrast  02/14/2012  **ADDENDUM** CREATED: 02/14/2012 15:14:30  Per discussion with Dr. Venetia Maxon 02/13/2022  3:15 p.m., possibility of cord infarct causing or contributing to the cervical cord / upper thoracic cord signal abnormality was discussed.  It is difficult to determine whether the cord signal abnormality is from compression or infarct.  **END ADDENDUM** SIGNED BY: Almedia Balls. Constance Goltz, M.D.   02/14/2012  *RADIOLOGY REPORT*  Clinical Data:  Staph Aureus septicemia.  Multiple lung cavitary lesions.  Left upper extremity deep venous thrombosis.  Development of paralysis.  The patient was scanned while on a ventilator.  The present examination was tailored to complete the exam in  the most expedient fashion given the limitation of the time the ventilator would function within the MR room.  MRI HEAD WITHOUT AND WITH CONTRAST  Technique:  Multiplanar, multiecho pulse sequences of the brain and surrounding structures were obtained  without and with intravenous contrast.  Contrast: 20mL MULTIHANCE GADOBENATE DIMEGLUMINE 529 MG/ML IV SOLN 20 ml MultiHance.  Comparison:  No comparison MR.  Findings:  No acute infarct.  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  Scattered small vessel disease type changes.  No intracranial hemorrhage.  No hydrocephalus.  Pulsation artifacts extend through the brain without discrete area of abnormal enhancement.  No intracranial mass identified.  Major intracranial vascular structures are patent.  Paranasal sinus opacification fairly significant involving the ethmoid sinus air cells and maxillary sinuses. Mastoid air cell opacification bilaterally.  Orbital structures grossly intact.  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  No acute infarct.  No definitive findings of abnormal intracranial enhancement.  Prominent paranasal sinus and mastoid air cell opacification.  MRI CERVICAL, THORACIC AND LUMBAR SPINE WITHOUT AND WITH CONTRAST  Technique:  Multiplanar and multiecho pulse sequences of the cervical spine, to include the craniocervical junction and cervicothoracic junction, and thoracic and lumbar spine, were obtained without and with intravenous contrast.  MRI CERVICAL SPINE  Findings:  Markedly abnormal examination.  Altered signal intensity involving portions of C2-T1 vertebral. Altered signal intensity of the surrounding soft tissue/musculature.  Heterogeneous epidural enhancement throughout the cervical spine and extending into the upper thoracic spine. Within this epidural thickenning and enhancement, there are small areas which do not enhance.  Findings are highly suspicious for infection which may have track from the chest into the cervical canal and upper thoracic canal causing epidural phlegmon/epidural abscesses which when combined with cervical spondylotic  changes is causing cord compression.   Within the compressed cord, there is altered signal intensity which extends from C2 through T1.  The appearance is suggestive of edema from the cord compression.  Other causes of cord signal abnormality such as that related to infectious transverse myelitis or post infection demyelinating process felt to be secondary less likely considerations which can be considered after mass effect upon the cord has been addressed.  Vertebral arteries remain patent.  Prominent pooling of secretions in this patient is intubated.  Cervical spondylotic changes include:  C2-3:  Small broad-based protrusion most notable left paracentral position.  C3-4:  Broad-based protrusion/osteophyte.  Uncinate hypertrophy. Kyphosis centered at this level.  C4-5:  Broad-based disc osteophyte greater to the right.  Uncinate bony overgrowth with moderate to marked bilateral foraminal narrowing.  C5-6:  Broad-based protrusion/osteophyte.  Uncinate hypertrophy. Moderate to marked right-sided and mild to moderate left-sided foraminal narrowing.  C6-7:  No significant disc disease.  C7-T1:  Mild facet joint degenerative changes.  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  Blood as cause of epidural process felt to be secondary less likely consideration.  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 In the remainder of the thoracic spine, there is mild enhancement along the periphery of the cord which may represent prominent vasculature or subtle spread of infection but without findings of cord compression.  T7-8:  Small right paracentral disc protrusion.  Minimal cord contact.  T8-9:  Shallow protrusion.  Altered signal intensity vertebra may reflect underlying changes of anemia.  Significant lung parenchymal changes.  Please  see recent CT report.  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 In the remainder of the thoracic spine, there is mild enhancement along the periphery of the cord which may represent prominent vasculature or subtle spread of infection but without findings of cord compression caused by infection.  Please see above.  MRI LUMBAR SPINE  Findings: Conus upper L1 level.  There may be a subtle enhancement nerve roots indicating spread of infection however, no evidence of spinal stenosis or nerve root compression caused by infection.  L2-3:  Mild facet joint degenerative changes.  L3-4:  Mild facet joint degenerative changes.  L4-5:  Facet joint degenerative changes.  Mild bulge.  L5-S1:  Minimal facet joint degenerative changes.  Bilateral renal lesions some of which can be confirmed as cysts whereas others are too small to characterize.  Altered signal intensity bone marrow may reflect changes of underlying anemia.  IMPRESSION: There may be a subtle enhancement nerve roots indicating spread of infection however, no evidence of spinal stenosis or nerve root compression caused by infection.  Critical Value/emergent results were called by telephone at the time of interpretation on 02/14/2012  at 1:40 p.m. to Dr. Amada Jupiter, who verbally acknowledged these results. Neurosurgical consultation recommended.  Original Report Authenticated By: Lacy Duverney, M.D.    Dg Chest Port 1 View  02/15/2012  *RADIOLOGY REPORT*  Clinical Data: Evaluate endotracheal tube and lines.  Recent postop for left lung and chest wall abscesses.  PORTABLE CHEST - 1 VIEW  Comparison: 02/14/2012  Findings: Endotracheal tube 7.5 cm above carina.  Left IJ central line tip at mid SVC.  The nasogastric tube is not well visualized distally, and likely terminates in the mid  to lower thoracic esophagus.  A left-sided chest tube is unchanged in position.  Normal heart size.  Probable small left pleural  effusion. No pneumothorax.  Persistent left greater than right lower lobe predominant airspace disease.  Remote right rib trauma.  IMPRESSION:  1. NG tube not well visualized distally; likely terminates at the mid to lower thoracic esophagus.  Consider repositioning.   These results will be called to the ordering clinician or representative by the Radiologist Assistant, and communication documented in the PACS Dashboard. 2.  Otherwise, similar appearance of left greater than right lower lobe predominant airspace disease. 3.  Left chest tube in place; no pneumothorax.   Original Report Authenticated By: Jeronimo Greaves, M.D.    Dg Chest Port 1 View  02/14/2012  *RADIOLOGY REPORT*  Clinical Data: Postop from left lung and chest wall abscess.  PORTABLE CHEST - 1 VIEW  Comparison: 02/13/2012  Findings: Support apparatus including left chest tube remain in appropriate position.  No pneumothorax identified.  Asymmetric airspace disease is again seen involving left lung greater than right, without significant change.  Heart size is normal.  IMPRESSION: No significant change in asymmetric bilateral airspace disease.  No pneumothorax identified.   Original Report Authenticated By: Myles Rosenthal, M.D.     Assessment/Plan:   LOS: 7 days  Continue support. Spoke with wife: uncomplicated surgery, pressure removed from spinal cord with laminectomy - generalized edema in area. Cord edema persists (no surgical remedy) - will monitor for changes/improvement.  Reassured wife as to her (& her family's) timely seeking of medical care; as she voices concern that she "should've done something sooner".   Georgiann Cocker 02/15/2012, 8:39 AM

## 2012-02-15 NOTE — Consult Note (Signed)
WOC follow up  Wound type: surgical s/p debridement left upper chest wall abscess Measurement: 3cm x 8.5cm x 3cm Wound bed: pink, moist, viable tissue/muscle Drainage (amount, consistency, odor) moderate to heavy under sponge, serous, no odor Periwound:intact  Dressing procedure/placement/frequency: 1pc of white foam placed in base of wound, then 1pc of black foam placed to fill remainder of the wound bed, drape and seal obtained at . Pt sedated and bolus given for dressing change.    WOC will follow along with you for [facility] dressing changes Morad Tal Eliberto Ivory RN,CWOCN [phone]

## 2012-02-15 NOTE — Progress Notes (Signed)
ANTIBIOTIC CONSULT NOTE - FOLLOW UP  Pharmacy Consult for vanc/gent Indication: MRSA PNA and bacteremia   No Known Allergies  Patient Measurements: Height: 5\' 10"  (177.8 cm) Weight: 189 lb 13.1 oz (86.1 kg) IBW/kg (Calculated) : 73    Vital Signs: Temp: 101.4 F (38.6 C) (01/24 1638) Temp src: Axillary (01/24 1638) BP: 141/64 mmHg (01/24 1600) Pulse Rate: 110  (01/24 1900) Intake/Output from previous day: 01/23 0701 - 01/24 0700 In: 3827.4 [I.V.:3327.4; IV Piggyback:500] Out: 2614 [Urine:2350; Drains:160; Blood:100; Chest Tube:4] Intake/Output from this shift:    Labs:  [facility] 02/15/12 0450 02/14/12 0429 02/13/12 0422  WBC 21.8* 26.8* 27.2*  HGB 7.9* 8.5* 9.0*  PLT 574* 477* 384  LABCREA -- -- --  CREATININE 0.68 0.75 0.70   Estimated Creatinine Clearance: 114.1 ml/min (by C-G formula based on Cr of 0.68).  Basename 02/15/12 1853  VANCOTROUGH 17.4  VANCOPEAK --  Drue Dun --  GENTTROUGH --  GENTPEAK --  GENTRANDOM --  TOBRATROUGH --  Nolen Mu --  TOBRARND --  AMIKACINPEAK --  AMIKACINTROU --  AMIKACIN --     Microbiology: Recent Results (from the past 720 hour(s))  TECHNOLOGIST REVIEW     Status: Normal   Collection Time   02/06/12  9:42 AM      Component Value Range Status Comment   Technologist Review Occ Metas and Myelocytes present   Final   CULTURE, BLOOD (ROUTINE X 2)     Status: Normal   Collection Time   02/08/12  3:39 PM      Component Value Range Status Comment   Specimen Description BLOOD RIGHT ANTECUBITAL   Final    Special Requests BOTTLES DRAWN AEROBIC AND ANAEROBIC 5CC   Final    Culture  Setup Time 02/08/2012 23:18   Final    Culture     Final    Value: METHICILLIN RESISTANT STAPHYLOCOCCUS AUREUS     Note: RIFAMPIN AND GENTAMICIN SHOULD NOT BE USED AS SINGLE DRUGS FOR TREATMENT OF STAPH INFECTIONS. This organism DOES NOT demonstrate inducible Clindamycin resistance in vitro. CRITICAL RESULT CALLED TO, READ BACK BY AND VERIFIED  WITH: MANDY ROLLS      02/10/12 @ 9:03PM BY RUSCA.     Note: Gram Stain Report Called to,Read Back By and Verified With: MEELY RICHARDSON 02/09/12 @ 2:26PM BY RUSCA.   Report Status 02/11/2012 FINAL   Final    Organism ID, Bacteria METHICILLIN RESISTANT STAPHYLOCOCCUS AUREUS   Final   CULTURE, BLOOD (ROUTINE X 2)     Status: Normal   Collection Time   02/08/12  3:49 PM      Component Value Range Status Comment   Specimen Description BLOOD LEFT HAND   Final    Special Requests BOTTLES DRAWN AEROBIC ONLY 2CC   Final    Culture  Setup Time 02/08/2012 23:19   Final    Culture     Final    Value: STAPHYLOCOCCUS AUREUS     Note: SUSCEPTIBILITIES PERFORMED ON PREVIOUS CULTURE WITHIN THE LAST 5 DAYS. CRITICAL RESULT CALLED TO, READ BACK BY AND VERIFIED WITH: MANDY ROLLS 02/10/12 @ 9:03PM BY RUSCA.     Note: Gram Stain Report Called to,Read Back By and Verified With: MEELY RICHARDSON 02/09/12 @ 2:26PM BY RUSCA.   Report Status 02/11/2012 FINAL   Final   URINE CULTURE     Status: Normal   Collection Time   02/08/12  4:58 PM      Component Value Range Status Comment   Specimen  Description URINE, CATHETERIZED   Final    Special Requests NONE   Final    Culture  Setup Time 02/09/2012 01:48   Final    Colony Count NO GROWTH   Final    Culture NO GROWTH   Final    Report Status 02/10/2012 FINAL   Final   CULTURE, EXPECTORATED SPUTUM-ASSESSMENT     Status: Normal   Collection Time   02/08/12  5:54 PM      Component Value Range Status Comment   Specimen Description SPUTUM   Final    Special Requests NONE   Final    Sputum evaluation     Final    Value: THIS SPECIMEN IS ACCEPTABLE. RESPIRATORY CULTURE REPORT TO FOLLOW.   Report Status 02/08/2012 FINAL   Final   MRSA PCR SCREENING     Status: Abnormal   Collection Time   02/08/12  5:54 PM      Component Value Range Status Comment   MRSA by PCR POSITIVE (*) NEGATIVE Final   CULTURE, RESPIRATORY     Status: Normal   Collection Time   02/08/12  5:54 PM        Component Value Range Status Comment   Specimen Description SPUTUM   Final    Special Requests NONE   Final    Gram Stain     Final    Value: ABUNDANT WBC PRESENT,BOTH PMN AND MONONUCLEAR     RARE SQUAMOUS EPITHELIAL CELLS PRESENT     ABUNDANT GRAM POSITIVE COCCI     IN PAIRS IN CLUSTERS   Culture     Final    Value: ABUNDANT METHICILLIN RESISTANT STAPHYLOCOCCUS AUREUS     Note: RIFAMPIN AND GENTAMICIN SHOULD NOT BE USED AS SINGLE DRUGS FOR TREATMENT OF STAPH INFECTIONS. This organism DOES NOT demonstrate inducible Clindamycin resistance in vitro.   Report Status 02/12/2012 FINAL   Final    Organism ID, Bacteria METHICILLIN RESISTANT STAPHYLOCOCCUS AUREUS   Final   CULTURE, BLOOD (ROUTINE X 2)     Status: Normal (Preliminary result)   Collection Time   02/09/12  7:50 PM      Component Value Range Status Comment   Specimen Description Blood   Final    Special Requests NONE   Final    Culture  Setup Time 02/10/2012 02:13   Final    Culture     Final    Value:        BLOOD CULTURE RECEIVED NO GROWTH TO DATE CULTURE WILL BE HELD FOR 5 DAYS BEFORE ISSUING A FINAL NEGATIVE REPORT   Report Status PENDING   Incomplete   CULTURE, BLOOD (ROUTINE X 2)     Status: Normal (Preliminary result)   Collection Time   02/09/12  7:55 PM      Component Value Range Status Comment   Specimen Description Blood   Final    Special Requests NONE   Final    Culture  Setup Time 02/10/2012 02:13   Final    Culture     Final    Value:        BLOOD CULTURE RECEIVED NO GROWTH TO DATE CULTURE WILL BE HELD FOR 5 DAYS BEFORE ISSUING A FINAL NEGATIVE REPORT   Report Status PENDING   Incomplete   ANAEROBIC CULTURE     Status: Normal   Collection Time   02/10/12  4:32 PM      Component Value Range Status Comment   Specimen Description ABSCESS LEFT CHEST  Final    Special Requests PATIENT ON FOLLOWING ANCEF VANC   Final    Gram Stain     Final    Value: ABUNDANT WBC PRESENT, PREDOMINANTLY PMN     NO SQUAMOUS  EPITHELIAL CELLS SEEN     ABUNDANT GRAM POSITIVE COCCI     IN CLUSTERS   Culture NO ANAEROBES ISOLATED   Final    Report Status 02/15/2012 FINAL   Final   GRAM STAIN     Status: Normal   Collection Time   02/10/12  4:32 PM      Component Value Range Status Comment   Specimen Description ABSCESS LEFT CHEST   Final    Special Requests PATIENT ON FOLLOWING ANCEF VANC   Final    Gram Stain     Final    Value: ABUNDANT WBC PRESENT, PREDOMINANTLY PMN     ABUNDANT GRAM POSITIVE COCCI IN CLUSTERS     Gram Stain Report Called to,Read Back By and Verified With: P.WEATHERLY,RN 02/10/12 1725 EHOWARD   Report Status 02/10/2012 FINAL   Final   CULTURE, ROUTINE-ABSCESS     Status: Normal   Collection Time   02/10/12  4:34 PM      Component Value Range Status Comment   Specimen Description ABSCESS LEFT CHEST   Final    Special Requests PATIENT ON FOLLOWING VANC ANCEF   Final    Gram Stain     Final    Value: ABUNDANT WBC PRESENT, PREDOMINANTLY PMN     NO SQUAMOUS EPITHELIAL CELLS SEEN     ABUNDANT GRAM POSITIVE COCCI IN CLUSTERS     Gram Stain Report Called to,Read Back By and Verified With: Gram Stain Report Called to,Read Back By and Verified With: P WEATHERLY RN 02/10/12 1725 BY ZOXWRUE Performed at [facility]   Culture     Final    Value: ABUNDANT METHICILLIN RESISTANT STAPHYLOCOCCUS AUREUS     Note: RIFAMPIN AND GENTAMICIN SHOULD NOT BE USED AS SINGLE DRUGS FOR TREATMENT OF STAPH INFECTIONS. This organism DOES NOT demonstrate inducible Clindamycin resistance in vitro. CRITICAL RESULT CALLED TO, READ BACK BY AND VERIFIED WITH: SUSAN F 1/22 @      820 BY REAMM   Report Status 02/13/2012 FINAL   Final    Organism ID, Bacteria METHICILLIN RESISTANT STAPHYLOCOCCUS AUREUS   Final   GRAM STAIN     Status: Normal   Collection Time   02/14/12  5:25 PM      Component Value Range Status Comment   Specimen Description ABSCESS NECK LEFT   Final    Special Requests PATIENT ON FOLLOWING VANCOMYCIN    Final    Gram Stain     Final    Value: RARE WBC PRESENT, PREDOMINANTLY PMN     NO ORGANISMS SEEN     Gram Stain Report Called to,Read Back By and Verified With: RN T. HARRELSON 02/14/12 1924 KERAN M.   Report Status 02/14/2012 FINAL   Final   ANAEROBIC CULTURE     Status: Normal (Preliminary result)   Collection Time   02/14/12  5:25 PM      Component Value Range Status Comment   Specimen Description ABSCESS NECK LEFT   Final    Special Requests PATIENT ON FOLLOWING VANCOMYCIN   Final    Gram Stain     Final    Value: RARE WBC PRESENT, PREDOMINANTLY PMN     NO ORGANISMS SEEN     Gram Stain Report Called  to,Read Back By and Verified With: Gram Stain Report Called to,Read Back By and Verified With: RN T HARRELSON @18 :24 ON 02/14/12 BY Sallyanne Kuster M. Performed at [facility]   Culture     Final    Value: NO ANAEROBES ISOLATED; CULTURE IN PROGRESS FOR 5 DAYS   Report Status PENDING   Incomplete   CULTURE, ROUTINE-ABSCESS     Status: Normal (Preliminary result)   Collection Time   02/14/12  5:25 PM      Component Value Range Status Comment   Specimen Description ABSCESS NECK LEFT   Final    Special Requests PATIENT ON FOLLOWING VANCOMYCIN   Final    Gram Stain     Final    Value: RARE WBC PRESENT, PREDOMINANTLY PMN     NO ORGANISMS SEEN     Gram Stain Report Called to,Read Back By and Verified With: Gram Stain Report Called to,Read Back By and Verified With: RN T. HARRELSON @19 :24 ON 02/14/12 BY Sallyanne Kuster M. Performed at [facility]   Culture NO GROWTH   Final    Report Status PENDING   Incomplete      Assessment: Mr. Kintz is a 52 y.o M with respiratory and blood cultures positive for MRSA. His vancomycin trough today is 17.4 mcg/ml which is within desired goal of 15-20 mcg/ml.   Goal of Therapy:  Vancomycin trough level 15-20 mcg/ml  Plan:  1. Continue vancomycin 1250 mg IV q8h 2. Continue gent 80 mg IV q8h 3. Check gent trough 02/16/12 Herby Abraham,  Pharm.D. [phone] 02/15/2012 7:53 PM

## 2012-02-16 ENCOUNTER — Inpatient Hospital Stay (HOSPITAL_COMMUNITY): Payer: Medicare HMO

## 2012-02-16 DIAGNOSIS — A4101 Sepsis due to Methicillin susceptible Staphylococcus aureus: Secondary | ICD-10-CM

## 2012-02-16 DIAGNOSIS — R7881 Bacteremia: Secondary | ICD-10-CM

## 2012-02-16 DIAGNOSIS — A419 Sepsis, unspecified organism: Secondary | ICD-10-CM

## 2012-02-16 LAB — CBC
Hemoglobin: 7.6 g/dL — ABNORMAL LOW (ref 13.0–17.0)
MCH: 29.8 pg (ref 26.0–34.0)
Platelets: 622 10*3/uL — ABNORMAL HIGH (ref 150–400)
RBC: 2.55 MIL/uL — ABNORMAL LOW (ref 4.22–5.81)
WBC: 19.4 10*3/uL — ABNORMAL HIGH (ref 4.0–10.5)

## 2012-02-16 LAB — POCT I-STAT 3, ART BLOOD GAS (G3+)
Bicarbonate: 24.5 mEq/L — ABNORMAL HIGH (ref 20.0–24.0)
pCO2 arterial: 34.7 mmHg — ABNORMAL LOW (ref 35.0–45.0)
pH, Arterial: 7.463 — ABNORMAL HIGH (ref 7.350–7.450)
pO2, Arterial: 91 mmHg (ref 80.0–100.0)

## 2012-02-16 LAB — GLUCOSE, CAPILLARY
Glucose-Capillary: 119 mg/dL — ABNORMAL HIGH (ref 70–99)
Glucose-Capillary: 120 mg/dL — ABNORMAL HIGH (ref 70–99)

## 2012-02-16 LAB — MAGNESIUM: Magnesium: 2.2 mg/dL (ref 1.5–2.5)

## 2012-02-16 LAB — CULTURE, BLOOD (ROUTINE X 2): Culture: NO GROWTH

## 2012-02-16 LAB — BASIC METABOLIC PANEL
CO2: 29 mEq/L (ref 19–32)
Chloride: 111 mEq/L (ref 96–112)
Creatinine, Ser: 0.65 mg/dL (ref 0.50–1.35)
GFR calc Af Amer: >90 mL/min (ref >90)
Potassium: 3.8 mEq/L (ref 3.5–5.1)

## 2012-02-16 LAB — PROCALCITONIN: Procalcitonin: 0.29 ng/mL

## 2012-02-16 MED ORDER — ACETAMINOPHEN 160 MG/5ML PO SOLN
650.0000 mg | ORAL | Status: DC | PRN
  Administered 2012-02-16 – 2012-02-17 (×3): 650 mg via ORAL
  Filled 2012-02-16 (×3): qty 20.3

## 2012-02-16 MED ORDER — FREE WATER
200.0000 mL | Freq: Three times a day (TID) | Status: DC
  Administered 2012-02-16 – 2012-02-17 (×3): 200 mL

## 2012-02-16 NOTE — Progress Notes (Signed)
Subjective: Patient reports Remains intubated  Objective: Vital signs in last 24 hours: Temp:  [101.1 F (38.4 C)-103.3 F (39.6 C)] 102.4 F (39.1 C) (01/25 0805) Pulse Rate:  [98-128] 124  (01/25 1000) Resp:  [15-31] 31  (01/25 1000) BP: (141-184)/(61-76) 155/70 mmHg (01/25 0800) SpO2:  [91 %-98 %] 92 % (01/25 1000) Arterial Line BP: (126-201)/(49-82) 169/68 mmHg (01/25 1000) FiO2 (%):  [40 %] 40 % (01/25 1000) Weight:  [86.7 kg (191 lb 2.2 oz)] 86.7 kg (191 lb 2.2 oz) (01/25 0455)  Intake/Output from previous day: 01/24 0701 - 01/25 0700 In: 5770.3 [I.V.:4560.3; NG/GT:300; IV Piggyback:900] Out: 3280 [Urine:3060; Emesis/NG output:100; Drains:110; Chest Tube:10] Intake/Output this shift: Total I/O In: 517 [I.V.:337; NG/GT:180] Out: 900 [Urine:650; Emesis/NG output:200; Drains:50]  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 His incision bandages dry  Lab Results:  Basename 02/16/12 0445 02/15/12 0450  WBC 19.4* 21.8*  HGB 7.6* 7.9*  HCT 24.4* 25.8*  PLT 622* 574*   BMET  Basename 02/16/12 0445 02/15/12 0450  NA 147* 146*  K 3.8 4.5  CL 111 111  CO2 29 29  GLUCOSE 130* 134*  BUN 18 18  CREATININE 0.65 0.68  CALCIUM 8.0* 8.2*    Studies/Results: Dg Cervical Spine 1 View  02/14/2012  *RADIOLOGY REPORT*  Clinical Data: Cervical laminectomy.  DG CERVICAL SPINE - 1 VIEW  Comparison: MR cervical spine 02/14/2012.  Findings: Single intraoperative view of the cervical spine in the lateral projection is provided.  Probes are identified at the level of the C2 and C3 spinous processes.  IMPRESSION: Localization as above.   Original Report Authenticated By: Holley Dexter, M.D.    Mr Laqueta Jean Wo Contrast  02/14/2012  **ADDENDUM** CREATED: 02/14/2012 15:14:30  Per discussion  with Dr. Venetia Maxon 02/13/2022  3:15 Brock.m., possibility of cord infarct causing or contributing to the cervical cord / upper thoracic cord signal abnormality was discussed.  It is difficult to determine whether the cord signal abnormality is from compression or infarct.  **END ADDENDUM** SIGNED BY: Almedia Balls. Constance Goltz, M.D.   02/14/2012  *RADIOLOGY REPORT*  Clinical Data:  Staph Aureus septicemia.  Multiple lung cavitary lesions.  Left upper extremity deep venous thrombosis.  Development of paralysis.  The patient was scanned while on a ventilator.  The present examination was tailored to complete the exam in  the most expedient fashion given the limitation of the time the ventilator would function within the MR room.  MRI HEAD WITHOUT AND WITH CONTRAST  Technique:  Multiplanar, multiecho pulse sequences of the brain and surrounding structures were obtained without and with intravenous contrast.  Contrast: 20mL MULTIHANCE GADOBENATE DIMEGLUMINE 529 MG/ML IV SOLN 20 ml MultiHance.  Comparison:  No comparison MR.  Findings:  No acute infarct.  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  Scattered small vessel disease type changes.  No intracranial hemorrhage.  No hydrocephalus.  Pulsation artifacts extend through the brain without discrete area of abnormal enhancement.  No intracranial mass identified.  Major intracranial vascular structures are patent.  Paranasal sinus opacification fairly significant involving the ethmoid sinus air cells and maxillary sinuses. Mastoid air cell opacification bilaterally.  Orbital structures grossly intact.  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  No acute infarct.  No definitive findings of abnormal intracranial enhancement.  Prominent paranasal sinus and mastoid air cell opacification.  MRI CERVICAL, THORACIC AND LUMBAR SPINE WITHOUT AND WITH CONTRAST  Technique:  Multiplanar and  multiecho pulse sequences of the cervical spine, to include the craniocervical junction and cervicothoracic junction, and thoracic and lumbar spine, were obtained without and with intravenous contrast.  MRI CERVICAL SPINE  Findings:  Markedly abnormal examination.  Altered signal intensity involving portions of C2-T1 vertebral. Altered signal intensity of the surrounding soft tissue/musculature.  Heterogeneous epidural enhancement throughout the cervical spine and extending into the upper thoracic spine. Within this epidural thickenning and enhancement, there are small areas which do not enhance.  Findings are highly suspicious for infection which may have track from the chest into the cervical canal and upper thoracic canal causing epidural phlegmon/epidural abscesses which when combined with cervical spondylotic changes is causing cord compression.  Within the compressed cord, there is altered signal intensity which extends from C2 through T1.  The appearance is suggestive of edema from the cord compression.  Other causes of cord signal abnormality such as that related to infectious transverse myelitis or post infection demyelinating process felt to be secondary less likely considerations which can be considered after mass effect upon the cord has been addressed.  Vertebral arteries remain patent.  Prominent pooling of secretions in this patient is intubated.  Cervical spondylotic changes include:  C2-3:  Small broad-based protrusion most notable left paracentral position.  C3-4:  Broad-based protrusion/osteophyte.  Uncinate hypertrophy. Kyphosis centered at this level.  C4-5:  Broad-based disc osteophyte greater to the right.  Uncinate bony overgrowth with moderate to marked bilateral foraminal narrowing.  C5-6:  Broad-based protrusion/osteophyte.  Uncinate hypertrophy. Moderate to marked right-sided and mild to moderate left-sided foraminal narrowing.  C6-7:  No significant disc disease.  C7-T1:  Mild facet  joint degenerative changes.  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  Blood as cause of epidural process felt to be secondary less likely consideration.  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 In the remainder of the thoracic spine, there is mild enhancement along the periphery of the cord which may represent prominent vasculature or subtle spread of infection but without findings of cord compression.  T7-8:  Small right paracentral disc protrusion.  Minimal cord contact.  T8-9:  Shallow protrusion.  Altered signal intensity vertebra may reflect underlying changes of anemia.  Significant lung parenchymal changes.  Please see recent CT report.  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 In the remainder of the thoracic spine, there is mild enhancement along the periphery of the cord which may represent prominent vasculature or subtle spread of infection but without findings of cord compression caused by infection.  Please see above.  MRI LUMBAR SPINE  Findings: Conus upper L1 level.  There may be a subtle enhancement nerve roots indicating spread of infection however, no evidence of spinal stenosis or nerve root compression caused by infection.  L2-3:  Mild facet joint degenerative changes.  L3-4:  Mild facet joint degenerative changes.  L4-5:  Facet joint degenerative changes.  Mild bulge.  L5-S1:  Minimal facet joint degenerative changes.  Bilateral renal lesions some of which can be confirmed as cysts whereas others are too small  to characterize.  Altered signal intensity bone marrow may reflect changes of underlying anemia.  IMPRESSION: There may be a subtle enhancement nerve roots indicating spread of infection however, no  evidence of spinal stenosis or nerve root compression caused by infection.  Critical Value/emergent results were called by telephone at the time of interpretation on 02/14/2012  at 1:40 Brock.m. to Dr. Amada Jupiter, who verbally acknowledged these results. Neurosurgical consultation recommended.  Original Report Authenticated By: Lacy Duverney, M.D.    Mr Cervical Spine W Wo Contrast  02/14/2012  **ADDENDUM** CREATED: 02/14/2012 15:14:30  Per discussion with Dr. Venetia Maxon 02/13/2022  3:15 Brock.m., possibility of cord infarct causing or contributing to the cervical cord / upper thoracic cord signal abnormality was discussed.  It is difficult to determine whether the cord signal abnormality is from compression or infarct.  **END ADDENDUM** SIGNED BY: Almedia Balls. Constance Goltz, M.D.   02/14/2012  *RADIOLOGY REPORT*  Clinical Data:  Staph Aureus septicemia.  Multiple lung cavitary lesions.  Left upper extremity deep venous thrombosis.  Development of paralysis.  The patient was scanned while on a ventilator.  The present examination was tailored to complete the exam in  the most expedient fashion given the limitation of the time the ventilator would function within the MR room.  MRI HEAD WITHOUT AND WITH CONTRAST  Technique:  Multiplanar, multiecho pulse sequences of the brain and surrounding structures were obtained without and with intravenous contrast.  Contrast: 20mL MULTIHANCE GADOBENATE DIMEGLUMINE 529 MG/ML IV SOLN 20 ml MultiHance.  Comparison:  No comparison MR.  Findings:  No acute infarct.  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  Scattered small vessel disease type changes.  No intracranial hemorrhage.  No hydrocephalus.  Pulsation artifacts extend through the brain without discrete area of abnormal enhancement.  No intracranial mass identified.  Major intracranial vascular structures are patent.  Paranasal sinus opacification fairly significant involving the  ethmoid sinus air cells and maxillary sinuses. Mastoid air cell opacification bilaterally.  Orbital structures grossly intact.  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  No acute infarct.  No definitive findings of abnormal intracranial enhancement.  Prominent paranasal sinus and mastoid air cell opacification.  MRI CERVICAL, THORACIC AND LUMBAR SPINE WITHOUT AND WITH CONTRAST  Technique:  Multiplanar and multiecho pulse sequences of the cervical spine, to include the craniocervical junction and cervicothoracic junction, and thoracic and lumbar spine, were obtained without and with intravenous contrast.  MRI CERVICAL SPINE  Findings:  Markedly abnormal examination.  Altered signal intensity involving portions of C2-T1 vertebral. Altered signal intensity of the surrounding soft tissue/musculature.  Heterogeneous epidural enhancement throughout the cervical spine and extending into the upper thoracic spine. Within this epidural thickenning and enhancement, there are small areas which do not enhance.  Findings are highly suspicious for infection which may have track from the chest into the cervical canal and upper thoracic canal causing epidural phlegmon/epidural abscesses which when combined with cervical spondylotic changes is causing cord compression.  Within the compressed cord, there is altered signal intensity which extends from C2 through T1.  The appearance is suggestive of edema from the cord compression.  Other causes of cord signal abnormality such as that related to infectious transverse myelitis or post infection demyelinating process felt to be secondary less likely considerations which can be considered after mass effect upon the cord has been addressed.  Vertebral arteries remain patent.  Prominent pooling of secretions in this patient is  intubated.  Cervical spondylotic changes include:  C2-3:  Small broad-based protrusion most notable left paracentral position.   C3-4:  Broad-based protrusion/osteophyte.  Uncinate hypertrophy. Kyphosis centered at this level.  C4-5:  Broad-based disc osteophyte greater to the right.  Uncinate bony overgrowth with moderate to marked bilateral foraminal narrowing.  C5-6:  Broad-based protrusion/osteophyte.  Uncinate hypertrophy. Moderate to marked right-sided and mild to moderate left-sided foraminal narrowing.  C6-7:  No significant disc disease.  C7-T1:  Mild facet joint degenerative changes.  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  Blood as cause of epidural process felt to be secondary less likely consideration.  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 In the remainder of the thoracic spine, there is mild enhancement along the periphery of the cord which may represent prominent vasculature or subtle spread of infection but without findings of cord compression.  T7-8:  Small right paracentral disc protrusion.  Minimal cord contact.  T8-9:  Shallow protrusion.  Altered signal intensity vertebra may reflect underlying changes of anemia.  Significant lung parenchymal changes.  Please see recent CT report.  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 In the remainder of the thoracic spine, there is mild enhancement along the periphery of the cord which may represent prominent vasculature or subtle spread of infection but without findings of cord compression caused by infection.  Please see above.  MRI LUMBAR SPINE  Findings: Conus upper L1 level.  There may be a subtle enhancement nerve roots indicating spread of infection however, no evidence of spinal stenosis or nerve root compression caused by infection.  L2-3:  Mild facet joint degenerative changes.  L3-4:   Mild facet joint degenerative changes.  L4-5:  Facet joint degenerative changes.  Mild bulge.  L5-S1:  Minimal facet joint degenerative changes.  Bilateral renal lesions some of which can be confirmed as cysts whereas others are too small to characterize.  Altered signal intensity bone marrow may reflect changes of underlying anemia.  IMPRESSION: There may be a subtle enhancement nerve roots indicating spread of infection however, no evidence of spinal stenosis or nerve root compression caused by infection.  Critical Value/emergent results were called by telephone at the time of interpretation on 02/14/2012  at 1:40 Brock.m. to Dr. Amada Jupiter, who verbally acknowledged these results. Neurosurgical consultation recommended.  Original Report Authenticated By: Lacy Duverney, M.D.    Mr Thoracic Spine W Wo Contrast  02/14/2012  **ADDENDUM** CREATED: 02/14/2012 15:14:30  Per discussion with Dr. Venetia Maxon 02/13/2022  3:15 Brock.m., possibility of cord infarct causing or contributing to the cervical cord / upper thoracic cord signal abnormality was discussed.  It is difficult to determine whether the cord signal abnormality is from compression or infarct.  **END ADDENDUM** SIGNED BY: Almedia Balls. Constance Goltz, M.D.   02/14/2012  *RADIOLOGY REPORT*  Clinical Data:  Staph Aureus septicemia.  Multiple lung cavitary lesions.  Left upper extremity deep venous thrombosis.  Development of paralysis.  The patient was scanned while on a ventilator.  The present examination was tailored to complete the exam in  the most expedient fashion given the limitation of the time the ventilator would function within the MR room.  MRI HEAD WITHOUT AND WITH CONTRAST  Technique:  Multiplanar, multiecho pulse sequences of the brain and surrounding structures  were obtained without and with intravenous contrast.  Contrast: 20mL MULTIHANCE GADOBENATE DIMEGLUMINE 529 MG/ML IV SOLN 20 ml MultiHance.  Comparison:  No comparison MR.  Findings:  No acute infarct.  Remote  small infarcts basal ganglia/posterior limb left internal capsule and right periventricular region.  Scattered small vessel disease type changes.  No intracranial hemorrhage.  No hydrocephalus.  Pulsation artifacts extend through the brain without discrete area of abnormal enhancement.  No intracranial mass identified.  Major intracranial vascular structures are patent.  Paranasal sinus opacification fairly significant involving the ethmoid sinus air cells and maxillary sinuses. Mastoid air cell opacification bilaterally.  Orbital structures grossly intact.  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  No acute infarct.  No definitive findings of abnormal intracranial enhancement.  Prominent paranasal sinus and mastoid air cell opacification.  MRI CERVICAL, THORACIC AND LUMBAR SPINE WITHOUT AND WITH CONTRAST  Technique:  Multiplanar and multiecho pulse sequences of the cervical spine, to include the craniocervical junction and cervicothoracic junction, and thoracic and lumbar spine, were obtained without and with intravenous contrast.  MRI CERVICAL SPINE  Findings:  Markedly abnormal examination.  Altered signal intensity involving portions of C2-T1 vertebral. Altered signal intensity of the surrounding soft tissue/musculature.  Heterogeneous epidural enhancement throughout the cervical spine and extending into the upper thoracic spine. Within this epidural thickenning and enhancement, there are small areas which do not enhance.  Findings are highly suspicious for infection which may have track from the chest into the cervical canal and upper thoracic canal causing epidural phlegmon/epidural abscesses which when combined with cervical spondylotic changes is causing cord compression.  Within the compressed cord, there is altered signal intensity which extends from C2 through T1.  The appearance is suggestive of edema from the cord compression.  Other causes of cord signal  abnormality such as that related to infectious transverse myelitis or post infection demyelinating process felt to be secondary less likely considerations which can be considered after mass effect upon the cord has been addressed.  Vertebral arteries remain patent.  Prominent pooling of secretions in this patient is intubated.  Cervical spondylotic changes include:  C2-3:  Small broad-based protrusion most notable left paracentral position.  C3-4:  Broad-based protrusion/osteophyte.  Uncinate hypertrophy. Kyphosis centered at this level.  C4-5:  Broad-based disc osteophyte greater to the right.  Uncinate bony overgrowth with moderate to marked bilateral foraminal narrowing.  C5-6:  Broad-based protrusion/osteophyte.  Uncinate hypertrophy. Moderate to marked right-sided and mild to moderate left-sided foraminal narrowing.  C6-7:  No significant disc disease.  C7-T1:  Mild facet joint degenerative changes.  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  Blood as cause of epidural process felt to be secondary less likely consideration.  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 In the remainder of the thoracic spine, there is mild enhancement along the periphery of the cord which may represent prominent vasculature or subtle spread of infection but without findings of cord compression.  T7-8:  Small right paracentral disc protrusion.  Minimal cord contact.  T8-9:  Shallow protrusion.  Altered signal intensity vertebra may reflect underlying changes of anemia.  Significant lung parenchymal changes.  Please see recent CT report.  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 In the remainder  of the thoracic spine, there is mild enhancement along the  periphery of the cord which may represent prominent vasculature or subtle spread of infection but without findings of cord compression caused by infection.  Please see above.  MRI LUMBAR SPINE  Findings: Conus upper L1 level.  There may be a subtle enhancement nerve roots indicating spread of infection however, no evidence of spinal stenosis or nerve root compression caused by infection.  L2-3:  Mild facet joint degenerative changes.  L3-4:  Mild facet joint degenerative changes.  L4-5:  Facet joint degenerative changes.  Mild bulge.  L5-S1:  Minimal facet joint degenerative changes.  Bilateral renal lesions some of which can be confirmed as cysts whereas others are too small to characterize.  Altered signal intensity bone marrow may reflect changes of underlying anemia.  IMPRESSION: There may be a subtle enhancement nerve roots indicating spread of infection however, no evidence of spinal stenosis or nerve root compression caused by infection.  Critical Value/emergent results were called by telephone at the time of interpretation on 02/14/2012  at 1:40 Brock.m. to Dr. Amada Jupiter, who verbally acknowledged these results. Neurosurgical consultation recommended.  Original Report Authenticated By: Lacy Duverney, M.D.    Mr Lumbar Spine W Wo Contrast  02/14/2012  **ADDENDUM** CREATED: 02/14/2012 15:14:30  Per discussion with Dr. Venetia Maxon 02/13/2022  3:15 Brock.m., possibility of cord infarct causing or contributing to the cervical cord / upper thoracic cord signal abnormality was discussed.  It is difficult to determine whether the cord signal abnormality is from compression or infarct.  **END ADDENDUM** SIGNED BY: Almedia Balls. Constance Goltz, M.D.   02/14/2012  *RADIOLOGY REPORT*  Clinical Data:  Staph Aureus septicemia.  Multiple lung cavitary lesions.  Left upper extremity deep venous thrombosis.  Development of paralysis.  The patient was scanned while on a ventilator.  The present examination was tailored to complete the exam in  the  most expedient fashion given the limitation of the time the ventilator would function within the MR room.  MRI HEAD WITHOUT AND WITH CONTRAST  Technique:  Multiplanar, multiecho pulse sequences of the brain and surrounding structures were obtained without and with intravenous contrast.  Contrast: 20mL MULTIHANCE GADOBENATE DIMEGLUMINE 529 MG/ML IV SOLN 20 ml MultiHance.  Comparison:  No comparison MR.  Findings:  No acute infarct.  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  Scattered small vessel disease type changes.  No intracranial hemorrhage.  No hydrocephalus.  Pulsation artifacts extend through the brain without discrete area of abnormal enhancement.  No intracranial mass identified.  Major intracranial vascular structures are patent.  Paranasal sinus opacification fairly significant involving the ethmoid sinus air cells and maxillary sinuses. Mastoid air cell opacification bilaterally.  Orbital structures grossly intact.  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  No acute infarct.  No definitive findings of abnormal intracranial enhancement.  Prominent paranasal sinus and mastoid air cell opacification.  MRI CERVICAL, THORACIC AND LUMBAR SPINE WITHOUT AND WITH CONTRAST  Technique:  Multiplanar and multiecho pulse sequences of the cervical spine, to include the craniocervical junction and cervicothoracic junction, and thoracic and lumbar spine, were obtained without and with intravenous contrast.  MRI CERVICAL SPINE  Findings:  Markedly abnormal examination.  Altered signal intensity involving portions of C2-T1 vertebral. Altered signal intensity of the surrounding soft tissue/musculature.  Heterogeneous epidural enhancement throughout the cervical spine and extending into the upper thoracic spine. Within this epidural thickenning and enhancement, there are  small areas which do not enhance.  Findings are  highly suspicious for infection which may have track from the chest into the cervical canal and upper thoracic canal causing epidural phlegmon/epidural abscesses which when combined with cervical spondylotic changes is causing cord compression.  Within the compressed cord, there is altered signal intensity which extends from C2 through T1.  The appearance is suggestive of edema from the cord compression.  Other causes of cord signal abnormality such as that related to infectious transverse myelitis or post infection demyelinating process felt to be secondary less likely considerations which can be considered after mass effect upon the cord has been addressed.  Vertebral arteries remain patent.  Prominent pooling of secretions in this patient is intubated.  Cervical spondylotic changes include:  C2-3:  Small broad-based protrusion most notable left paracentral position.  C3-4:  Broad-based protrusion/osteophyte.  Uncinate hypertrophy. Kyphosis centered at this level.  C4-5:  Broad-based disc osteophyte greater to the right.  Uncinate bony overgrowth with moderate to marked bilateral foraminal narrowing.  C5-6:  Broad-based protrusion/osteophyte.  Uncinate hypertrophy. Moderate to marked right-sided and mild to moderate left-sided foraminal narrowing.  C6-7:  No significant disc disease.  C7-T1:  Mild facet joint degenerative changes.  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  Blood as cause of epidural process felt to be secondary less likely consideration.  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 In the remainder of the thoracic spine, there is mild enhancement along the periphery of the cord which may represent prominent vasculature or subtle spread of infection but without findings  of cord compression.  T7-8:  Small right paracentral disc protrusion.  Minimal cord contact.  T8-9:  Shallow protrusion.  Altered signal intensity vertebra may reflect underlying changes of anemia.  Significant lung parenchymal changes.  Please see recent CT report.  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 In the remainder of the thoracic spine, there is mild enhancement along the periphery of the cord which may represent prominent vasculature or subtle spread of infection but without findings of cord compression caused by infection.  Please see above.  MRI LUMBAR SPINE  Findings: Conus upper L1 level.  There may be a subtle enhancement nerve roots indicating spread of infection however, no evidence of spinal stenosis or nerve root compression caused by infection.  L2-3:  Mild facet joint degenerative changes.  L3-4:  Mild facet joint degenerative changes.  L4-5:  Facet joint degenerative changes.  Mild bulge.  L5-S1:  Minimal facet joint degenerative changes.  Bilateral renal lesions some of which can be confirmed as cysts whereas others are too small to characterize.  Altered signal intensity bone marrow may reflect changes of underlying anemia.  IMPRESSION: There may be a subtle enhancement nerve roots indicating spread of infection however, no evidence of spinal stenosis or nerve root compression caused by infection.  Critical Value/emergent results were called by telephone at the time of interpretation on 02/14/2012  at 1:40 Brock.m. to Dr. Amada Jupiter, who verbally acknowledged these results. Neurosurgical consultation recommended.  Original Report Authenticated By: Lacy Duverney, M.D.    Dg Chest Port 1 View  02/16/2012  *RADIOLOGY REPORT*  Clinical Data: Ventilator dependent respiratory failure.  Follow up edema and/or pneumonia.  PORTABLE CHEST - 1 VIEW 02/16/2012 1610  hours:  Comparison: Portable chest x-rays yesterday and dating back to 02/11/2012.   Findings: Endotracheal tube tip somewhat high, approximately 9 cm above the carina and near the thoracic inlet.  Left jugular central venous catheter tip projects at the junction of the left innominate vein and SVC.  Nasogastric tube courses below the diaphragm into the stomach.  Left chest tube in place with no pneumothorax.  No significant change in the airspace consolidation at the left lung base, with mild improvement in aeration at the right lung base. Baseline changes of COPD again noted.  IMPRESSION:  1.  Endotracheal tube tip somewhat high, projecting approximately 9 cm above the carina near the thoracic inlet. 2.  Remaining support apparatus satisfactory. 3.  Stable pneumonia at the left lung base.  Improved aeration at the right lung base since yesterday.  These results were called by telephone on 02/16/2012 at 0935 hours to [facility], the nurse caring for the patient on the 2300 unit, who verbally acknowledged these results.   Original Report Authenticated By: Hulan Saas, M.D.    Dg Chest Port 1 View  02/15/2012  *RADIOLOGY REPORT*  Clinical Data: Evaluate endotracheal tube and lines.  Recent postop for left lung and chest wall abscesses.  PORTABLE CHEST - 1 VIEW  Comparison: 02/14/2012  Findings: Endotracheal tube 7.5 cm above carina.  Left IJ central line tip at mid SVC.  The nasogastric tube is not well visualized distally, and likely terminates in the mid to lower thoracic esophagus.  A left-sided chest tube is unchanged in position.  Normal heart size.  Probable small left pleural effusion. No pneumothorax.  Persistent left greater than right lower lobe predominant airspace disease.  Remote right rib trauma.  IMPRESSION:  1. NG tube not well visualized distally; likely terminates at the mid to lower thoracic esophagus.  Consider repositioning.   These results will be called to the ordering clinician or representative by the Radiologist Assistant, and communication documented in the PACS  Dashboard. 2.  Otherwise, similar appearance of left greater than right lower lobe predominant airspace disease. 3.  Left chest tube in place; no pneumothorax.   Original Report Authenticated By: Jeronimo Greaves, M.D.     Assessment/Plan: Posterior day 2 from cervical laminectomy decompression for epidural abscess. 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 It is okay to DC his Hemovac I would hold off on 3 anticoagulation until Dr. Venetia Maxon comes back on Monday is too soon in my opinion after a multilevel cervical laminectomy restart heparin. Patient is not necessarily a cervical collar for stability purposes.  LOS: 8 days     Frank Brock 02/16/2012, 10:33 AM

## 2012-02-16 NOTE — Progress Notes (Signed)
2 Days Post-Op Procedure(s) (LRB): POSTERIOR CERVICAL LAMINECTOMY FOR EPIDURAL ABSCESS (N/A) Subjective:  Left chest wall abscess and left empyema from MRSA Ventilator dependent, neurologic status unchanged with quadriplegia Minimal chest tube drainage White count slightly improved  Objective: Vital signs in last 24 hours: Temp:  [101.1 F (38.4 C)-103.3 F (39.6 C)] 101.8 F (38.8 C) (01/25 1612) Pulse Rate:  [98-128] 107  (01/25 1600) Cardiac Rhythm:  [-] Sinus tachycardia (01/25 1500) Resp:  [14-31] 20  (01/25 1600) BP: (152-184)/(61-80) 152/80 mmHg (01/25 1200) SpO2:  [92 %-98 %] 97 % (01/25 1600) Arterial Line BP: (126-201)/(49-82) 142/57 mmHg (01/25 1500) FiO2 (%):  [40 %] 40 % (01/25 1600) Weight:  [191 lb 2.2 oz (86.7 kg)] 191 lb 2.2 oz (86.7 kg) (01/25 0455)  Hemodynamic parameters for last 24 hours:    Intake/Output from previous day: 01/24 0701 - 01/25 0700 In: 5770.3 [I.V.:4560.3; NG/GT:300; IV Piggyback:900] Out: 3280 [Urine:3060; Emesis/NG output:100; Drains:110; Chest Tube:10] Intake/Output this shift: Total I/O In: 1440.1 [I.V.:885.1; NG/GT:255; IV Piggyback:300] Out: 2100 [Urine:1350; Emesis/NG output:650; Drains:100]  Chest tube and wound VAC in place  Lab Results:  [facility] 02/16/12 0445 02/15/12 0450  WBC 19.4* 21.8*  HGB 7.6* 7.9*  HCT 24.4* 25.8*  PLT 622* 574*   BMET:  Basename 02/16/12 0445 02/15/12 0450  NA 147* 146*  K 3.8 4.5  CL 111 111  CO2 29 29  GLUCOSE 130* 134*  BUN 18 18  CREATININE 0.65 0.68  CALCIUM 8.0* 8.2*    PT/INR: No results found for this basename: LABPROT,INR in the last 72 hours ABG    Component Value Date/Time   PHART 7.337* 02/15/2012 0449   HCO3 27.9* 02/15/2012 0449   TCO2 29 02/15/2012 0449   ACIDBASEDEF 1.0 02/10/2012 1945   O2SAT 94.0 02/15/2012 0449   CBG (last 3)   Basename 02/16/12 1608 02/16/12 1236 02/16/12 0759  GLUCAP 123* 138* 120*    Assessment/Plan: S/P Procedure(s) (LRB): POSTERIOR  CERVICAL LAMINECTOMY FOR EPIDURAL ABSCESS (N/A) Continue current care for left chest wall abscess and empyema   LOS: 8 days    VAN TRIGT III,Frank Brock 02/16/2012

## 2012-02-16 NOTE — Progress Notes (Signed)
Patient ID: JARVIS SAWA, male   DOB: Jan 27, 1960, 52 y.o.   MRN: [id number]    [facility]    Date of Admission:  02/08/2012           Day 9 vancomycin        Day 2 gentamicin Principal Problem:  *Staphylococcus aureus bacteremia with sepsis Active Problems:  H/O hypercholesteremia  H/O stroke  Acute respiratory failure  Chest wall abscess  Lung abscess  Status post thoracotomy, 1/19 - Gerhardt  Paraplegia  DVT of upper extremity (deep vein thrombosis)  Quadriplegia  Objective: Temp:  [101.1 F (38.4 C)-103.3 F (39.6 C)] 102.4 F (39.1 C) (01/25 0805) Pulse Rate:  [98-128] 115  (01/25 1209) Resp:  [15-31] 27  (01/25 1209) BP: (141-184)/(61-76) 155/70 mmHg (01/25 0800) SpO2:  [91 %-98 %] 95 % (01/25 1209) Arterial Line BP: (126-201)/(49-82) 169/68 mmHg (01/25 1000) FiO2 (%):  [40 %] 40 % (01/25 1209) Weight:  [191 lb 2.2 oz (86.7 kg)] 191 lb 2.2 oz (86.7 kg) (01/25 0455)  General: Sedated on vent  Lab Results Lab Results  Component Value Date   WBC 19.4* 02/16/2012   HGB 7.6* 02/16/2012   HCT 24.4* 02/16/2012   MCV 95.7 02/16/2012   PLT 622* 02/16/2012    Lab Results  Component Value Date   CREATININE 0.65 02/16/2012   BUN 18 02/16/2012   NA 147* 02/16/2012   K 3.8 02/16/2012   CL 111 02/16/2012   CO2 29 02/16/2012   Assessment: He has 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  Plan: 1. Continue vancomycin and gentamicin for now  Cliffton Asters, MD [facility] [phone] pager   [phone] cell 02/16/2012, 1:08 PM

## 2012-02-16 NOTE — Progress Notes (Signed)
PCCM Progress Note  Pt profile: 84M with recently discovered LUL/chest wall mass, admitted 1/17 with severe chest wall pain and hypoxemia, suspected PNA. Further history obtained 1/19: Pt developed  Small lesion on middle finger of L hand around 1/03. Developed severe upper L chest pain 1/08 and seen by primary MD 1/09. CXR normal @ that time. 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 Seen by Onc 1/15 and plans for eval of presumed lung cancer. Admitted via ED 1/17 with severe chest pain, hypoxemia, presumed PNA and continued presumption of LUL malignancy. Blood cultures drawn, abx started. Blood cx's turned positive on 1/18.   Studies/Events: 1/13 CT chest: Left upper lobe soft tissue mass anteriorly abutting the pleura  of 7.3 x 4.4 x 5.0 cm. There does appear to be surrounding infiltrate. This is worrisome for primary lung carcinoma. A portion of this soft tissue mass extends through the left anterior chest wall and involves the left pectoralis musculature. Multiple bilateral nodules worrisome for mets 1/19 concern for chest wall abscess rather than malignancy. Transferred to [facility] for TCTS eval 1/19 Irrigation and debridement L chest wall abscess, mini-thoracotomy, placement of VAC and L chest tube Tyrone Sage) 1/20 TTE >> normal LV fxn but grade 2 diastolic dysfxn, mild L atrial dilation, poor view of AV, no evidence MR 1/23 To OR for C2-C7 laminectomies for cord edema and compression 1/23 TEE >> no vegetations, no atrial thrombus  Lines, Tubes, etc: ETT 1/19 >>  L IJ CVL 1/19 >>  R radial A-line 1/19 >>   Microbiology: MRSA PCR 1/17 >> POSITIVE Resp 1/17 >> MRSA Urine 1/17 >> negative Blood 1/17 >> 2/2  MRSA  Blood 1/18 >>  Abscess 1/19 >> MRSA  Antibiotics:  Osletamivir 1/17 >> 1/19 Zosyn 1/17 >> 1/20 Vanc 1/17 >>   Consults:  TCTS ID Neuro NSGY  Best Practice: DVT: LMWH 1/18 SUP:  N/I Nutrition: Reg diet Glycemic control: N/I  Sedation/analgesia: propofol, fentanyl  Subj To OR 1/23 for urgent C2-C7 laminectomies for cord edema and compression Remains paraplegic  Obj: Temp:  [101.1 F (38.4 C)-103.3 F (39.6 C)] 102.4 F (39.1 C) (01/25 0805) Pulse Rate:  [98-128] 124  (01/25 1000) Resp:  [15-31] 31  (01/25 1000) BP: (141-184)/(61-76) 155/70 mmHg (01/25 0800) SpO2:  [91 %-98 %] 92 % (01/25 1000) Arterial Line BP: (126-201)/(49-82) 169/68 mmHg (01/25 1000) FiO2 (%):  [40 %] 40 % (01/25 1000) Weight:  [86.7 kg (191 lb 2.2 oz)] 86.7 kg (191 lb 2.2 oz) (01/25 0455)  Intake/Output Summary (Last 24 hours) at 02/16/12 1100 Last data filed at 02/16/12 1000  Gross per 24 hour  Intake   4896 ml  Output   3680 ml  Net   1216 ml   Gen: chronically ill appearing HEENT: WNL Neck: Fullness in L side of neck and supraclav region. No discrete nodes Chest: L upper chest wound,  Diffuse scattered rhonchi Cardiac: tachy, reg, no M Abd: soft, NT, NABS Ext: warm, no edema, erythematous L middle finger with mild L forearm erythema Neuro: R 4mm, L 3mm and react, unable to move either UE or LE, sensation is intact. Moves head and neck, clearly follows commands  BMET  Lab 02/16/12 0445 02/15/12 0450 02/14/12 0429 02/13/12 0422 02/12/12 0400  NA 147* 146* 145 144 138  K 3.8 4.5 -- -- --  CL 111 111 112 110 104  CO2 29 29 28 26  24  GLUCOSE 130* 134* 148* 125* 116*  BUN 18 18 21 21 21   CREATININE 0.65 0.68 0.75 0.70 0.78  CALCIUM 8.0* 8.2* 8.1* 8.3* 8.2*  MG 2.2 2.3 -- -- 2.1  PHOS 3.2 3.0 -- -- 2.9    CBC  Lab 02/16/12 0445 02/15/12 0450 02/14/12 0429  HGB 7.6* 7.9* 8.5*  HCT 24.4* 25.8* 26.4*  WBC 19.4* 21.8* 26.8*  PLT 622* 574* 477*    Lab 02/16/12 0445 02/15/12 0450 02/13/12 0422 02/11/12 0359  PROCALCITON 0.29 0.34 0.75 2.81    CXR: 1/25 LUL opacity resolving, stable B AS dz esp L base, L chest tube, no sig change when c/w 1/24  IMPRESSION: Patient  Active Problem List  Diagnosis  . H/O HTN  . H/O hypercholesteremia  . H/O stroke  . Acute respiratory failure  . Hypoxemia  . Pain, chest wall  . Chest wall abscess  . Lung abscess  . Staphylococcus aureus bacteremia with sepsis  . Status post thoracotomy, 1/19 Tyrone Sage  . Paraplegia  . DVT of upper extremity (deep vein thrombosis)  . Quadriplegia   MRSA pulmonary abscess s/p VATS thoracotomy 1/19 paraspinal epidural abscess  s/p laminectomies, appreciate Dr Fredrich Birks help. Remains plegic  MRSA bacteremia and sepsis ventilator dependence: failed attempt at PSV. Not sure how much his C spine injury is affecting him. He remains quadraplegic. Will most-likely need trach  plan -Post-op care per N surg and thoracics -ABX per ID - Further debridement/dressing changes at bedside, next 1/23  - ? Timing for repeat imaging, CT scan chest. Will discuss with Dr Tyrone Sage, depends on clinical course and CXR  LUE DVT noted 1/22; will hold restart of heparin gtt until cleared by N-surg. They would like to hold thru the weekend  Anemia  No evidence of bleeding Plan Pas Cont trend cbc Transfuse for hgb < 7   Hypernatremia  Plan Free water   CC time 35 min.  Patient seen and examined, agree with above note.  I dictated the care and orders written for this patient under my direction.  Alyson Reedy, MD [phone]

## 2012-02-17 ENCOUNTER — Inpatient Hospital Stay (HOSPITAL_COMMUNITY): Payer: Medicare HMO

## 2012-02-17 LAB — COMPREHENSIVE METABOLIC PANEL
AST: 72 U/L — ABNORMAL HIGH (ref 0–37)
Albumin: 1.3 g/dL — ABNORMAL LOW (ref 3.5–5.2)
Alkaline Phosphatase: 93 U/L (ref 39–117)
BUN: 22 mg/dL (ref 6–23)
CO2: 29 mEq/L (ref 19–32)
Chloride: 112 mEq/L (ref 96–112)
Creatinine, Ser: 0.65 mg/dL (ref 0.50–1.35)
GFR calc non Af Amer: >90 mL/min (ref >90)
Potassium: 3.4 mEq/L — ABNORMAL LOW (ref 3.5–5.1)
Total Bilirubin: 0.4 mg/dL (ref 0.3–1.2)

## 2012-02-17 LAB — CULTURE, ROUTINE-ABSCESS

## 2012-02-17 LAB — CBC
HCT: 22 % — ABNORMAL LOW (ref 39.0–52.0)
MCV: 94 fL (ref 78.0–100.0)
Platelets: 601 10*3/uL — ABNORMAL HIGH (ref 150–400)
RBC: 2.34 MIL/uL — ABNORMAL LOW (ref 4.22–5.81)
WBC: 15 10*3/uL — ABNORMAL HIGH (ref 4.0–10.5)

## 2012-02-17 LAB — GLUCOSE, CAPILLARY
Glucose-Capillary: 112 mg/dL — ABNORMAL HIGH (ref 70–99)
Glucose-Capillary: 112 mg/dL — ABNORMAL HIGH (ref 70–99)
Glucose-Capillary: 113 mg/dL — ABNORMAL HIGH (ref 70–99)
Glucose-Capillary: 129 mg/dL — ABNORMAL HIGH (ref 70–99)
Glucose-Capillary: 146 mg/dL — ABNORMAL HIGH (ref 70–99)

## 2012-02-17 LAB — BLOOD GAS, ARTERIAL
Bicarbonate: 27.7 mEq/L — ABNORMAL HIGH (ref 20.0–24.0)
Drawn by: 331001
O2 Saturation: 99.4 %
PEEP: 5 cmH2O
RATE: 14 resp/min
pCO2 arterial: 52 mmHg — ABNORMAL HIGH (ref 35.0–45.0)
pO2, Arterial: 136 mmHg — ABNORMAL HIGH (ref 80.0–100.0)

## 2012-02-17 LAB — PREPARE RBC (CROSSMATCH)

## 2012-02-17 LAB — CORTISOL-AM, BLOOD: Cortisol - AM: 22.6 ug/dL — ABNORMAL HIGH (ref 4.3–22.4)

## 2012-02-17 MED ORDER — PANTOPRAZOLE SODIUM 40 MG PO PACK
40.0000 mg | PACK | Freq: Every day | ORAL | Status: DC
  Filled 2012-02-17: qty 20

## 2012-02-17 MED ORDER — ACETAMINOPHEN 325 MG PO TABS
650.0000 mg | ORAL_TABLET | Freq: Four times a day (QID) | ORAL | Status: DC | PRN
  Administered 2012-02-21: 325 mg via ORAL
  Administered 2012-02-22: 650 mg via ORAL
  Filled 2012-02-17: qty 2
  Filled 2012-02-17: qty 1
  Filled 2012-02-17: qty 2

## 2012-02-17 MED ORDER — FREE WATER: 200.0000 mL | Freq: Three times a day (TID) | Status: DC

## 2012-02-17 MED ORDER — FENTANYL CITRATE 0.05 MG/ML IJ SOLN
25.0000 ug | INTRAMUSCULAR | Status: DC | PRN
  Administered 2012-02-17: 100 ug via INTRAVENOUS
  Administered 2012-02-17: 50 ug via INTRAVENOUS
  Filled 2012-02-17 (×2): qty 2

## 2012-02-17 MED ORDER — FENTANYL BOLUS VIA INFUSION
25.0000 ug | INTRAVENOUS | Status: DC | PRN
  Filled 2012-02-17: qty 100

## 2012-02-17 MED ORDER — FENTANYL CITRATE 0.05 MG/ML IJ SOLN
INTRAMUSCULAR | Status: AC
  Administered 2012-02-17: 50 ug
  Filled 2012-02-17: qty 2

## 2012-02-17 MED ORDER — OXYCODONE HCL 5 MG PO TABS
5.0000 mg | ORAL_TABLET | ORAL | Status: DC | PRN
  Administered 2012-02-17 – 2012-02-19 (×9): 10 mg via ORAL
  Administered 2012-02-19: 5 mg via ORAL
  Administered 2012-02-19: 10 mg via ORAL
  Administered 2012-02-20: 5 mg via ORAL
  Administered 2012-02-20 – 2012-02-21 (×3): 10 mg via ORAL
  Administered 2012-02-21: 5 mg via ORAL
  Administered 2012-02-22: 10 mg via ORAL
  Administered 2012-02-22: 5 mg via ORAL
  Administered 2012-02-23 – 2012-02-24 (×5): 10 mg via ORAL
  Administered 2012-02-24 – 2012-02-25 (×2): 5 mg via ORAL
  Administered 2012-02-25 (×2): 10 mg via ORAL
  Filled 2012-02-17 (×8): qty 2
  Filled 2012-02-17 (×3): qty 1
  Filled 2012-02-17 (×6): qty 2
  Filled 2012-02-17 (×2): qty 1
  Filled 2012-02-17 (×6): qty 2
  Filled 2012-02-17: qty 1
  Filled 2012-02-17 (×3): qty 2

## 2012-02-17 MED ORDER — OXYCODONE-ACETAMINOPHEN 5-325 MG PO TABS
1.0000 | ORAL_TABLET | Freq: Four times a day (QID) | ORAL | Status: DC | PRN
  Administered 2012-02-17: 1 via ORAL
  Filled 2012-02-17: qty 1

## 2012-02-17 MED ORDER — SODIUM CHLORIDE 0.9 % IV SOLN
INTRAVENOUS | Status: DC
  Administered 2012-02-17 – 2012-02-21 (×2): via INTRAVENOUS

## 2012-02-17 NOTE — Progress Notes (Signed)
Subjective: Patient reports Is feeling well with limited pain just on some ice chips  Objective: Vital signs in last 24 hours: Temp:  [99.3 F (37.4 C)-102.2 F (39 C)] 99.3 F (37.4 C) (01/26 0400) Pulse Rate:  [82-119] 92  (01/26 1000) Resp:  [13-27] 24  (01/26 1000) BP: (101-152)/(45-80) 122/55 mmHg (01/26 0825) SpO2:  [94 %-100 %] 97 % (01/26 1000) Arterial Line BP: (106-176)/(43-75) 155/63 mmHg (01/26 1000) FiO2 (%):  [40 %] 40 % (01/26 0900) Weight:  [84.5 kg (186 lb 4.6 oz)] 84.5 kg (186 lb 4.6 oz) (01/26 0400)  Intake/Output from previous day: 01/25 0701 - 01/26 0700 In: 4250.2 [I.V.:2585.2; NG/GT:715; IV Piggyback:950] Out: 3425 [Urine:2575; Emesis/NG output:650; Drains:200] Intake/Output this shift: Total I/O In: 241.4 [I.V.:66.4; NG/GT:125; IV Piggyback:50] Out: 775 [Urine:325; Emesis/NG output:450]  Pressure staying increased movement in his arms or his legs he is able to the one to 2 grip and wiggle his toes bilaterally wound is clean and dry  Lab Results:  Basename 02/17/12 0401 02/16/12 0445  WBC 15.0* 19.4*  HGB 7.0* 7.6*  HCT 22.0* 24.4*  PLT 601* 622*   BMET  Basename 02/17/12 0401 02/16/12 0445  NA 145 147*  K 3.4* 3.8  CL 112 111  CO2 29 29  GLUCOSE 126* 130*  BUN 22 18  CREATININE 0.65 0.65  CALCIUM 7.8* 8.0*    Studies/Results: Dg Chest Port 1 View  02/17/2012  *RADIOLOGY REPORT*  Clinical Data: Respiratory failure and status post evacuation of chest wall abscess with extension into the left hemithorax.  PORTABLE CHEST - 1 VIEW  Comparison: 02/16/2012  Findings: Endotracheal tube tip approximately 6 cm above the carina.  Central line positioning stable.  Lungs show stable chronic disease and patchy areas of residual atelectasis and infiltrates bilaterally.  Single left chest tube shows stable position.  No pneumothorax or significant pleural fluid identified.  IMPRESSION: Stable chest x-ray with residual patchy bilateral infiltrates.  No  evidence of pneumothorax.   Original Report Authenticated By: Irish Lack, M.D.    Dg Chest Port 1 View  02/16/2012  *RADIOLOGY REPORT*  Clinical Data: Ventilator dependent respiratory failure.  Follow up edema and/or pneumonia.  PORTABLE CHEST - 1 VIEW 02/16/2012 0653 hours:  Comparison: Portable chest x-rays yesterday and dating back to 02/11/2012.  Findings: Endotracheal tube tip somewhat high, approximately 9 cm above the carina and near the thoracic inlet.  Left jugular central venous catheter tip projects at the junction of the left innominate vein and SVC.  Nasogastric tube courses below the diaphragm into the stomach.  Left chest tube in place with no pneumothorax.  No significant change in the airspace consolidation at the left lung base, with mild improvement in aeration at the right lung base. Baseline changes of COPD again noted.  IMPRESSION:  1.  Endotracheal tube tip somewhat high, projecting approximately 9 cm above the carina near the thoracic inlet. 2.  Remaining support apparatus satisfactory. 3.  Stable pneumonia at the left lung base.  Improved aeration at the right lung base since yesterday.  These results were called by telephone on 02/16/2012 at 0935 hours to [facility], the nurse caring for the patient on the 2300 unit, who verbally acknowledged these results.   Original Report Authenticated By: Hulan Saas, M.D.     Assessment/Plan: Postop day 3 decompressive cervical anatomy as she started to show some signs of movement in both lower and upper extremities  LOS: 9 days     Tylique Aull P 02/17/2012, 10:46  AM     

## 2012-02-17 NOTE — Progress Notes (Signed)
3 Days Post-Op Procedure(s) (LRB): POSTERIOR CERVICAL LAMINECTOMY FOR EPIDURAL ABSCESS (N/A) Subjective: VAC for L  Chest wall abscess, L chest tube  Weaned and extubated today Some improvement in neuro fx  Objective: Vital signs in last 24 hours: Temp:  [97.9 F (36.6 C)-102.2 F (39 C)] 97.9 F (36.6 C) (01/26 2000) Pulse Rate:  [76-101] 91  (01/26 2100) Cardiac Rhythm:  [-] Normal sinus rhythm (01/26 2100) Resp:  [13-31] 31  (01/26 2100) BP: (101-161)/(45-88) 161/88 mmHg (01/26 2000) SpO2:  [93 %-100 %] 96 % (01/26 2100) Arterial Line BP: (106-155)/(43-63) 151/61 mmHg (01/26 1100) FiO2 (%):  [40 %] 40 % (01/26 0900) Weight:  [186 lb 4.6 oz (84.5 kg)] 186 lb 4.6 oz (84.5 kg) (01/26 0400)  Hemodynamic parameters for last 24 hours:   nsr Intake/Output from previous day: 01/25 0701 - 01/26 0700 In: 4250.2 [I.V.:2585.2; NG/GT:715; IV Piggyback:950] Out: 3425 [Urine:2575; Emesis/NG output:650; Drains:200] Intake/Output this shift: Total I/O In: 270 [I.V.:20; IV Piggyback:250] Out: 225 [Urine:225]  Min CT drainage, CXR clear  Lab Results:  Basename 02/17/12 0401 02/16/12 0445  WBC 15.0* 19.4*  HGB 7.0* 7.6*  HCT 22.0* 24.4*  PLT 601* 622*   BMET:  Basename 02/17/12 0401 02/16/12 0445  NA 145 147*  K 3.4* 3.8  CL 112 111  CO2 29 29  GLUCOSE 126* 130*  BUN 22 18  CREATININE 0.65 0.65  CALCIUM 7.8* 8.0*    PT/INR: No results found for this basename: LABPROT,INR in the last 72 hours ABG    Component Value Date/Time   PHART 7.356 02/17/2012 0322   HCO3 27.7* 02/17/2012 0322   TCO2 29.1 02/17/2012 0322   ACIDBASEDEF 1.0 02/10/2012 1945   O2SAT 99.4 02/17/2012 0322   CBG (last 3)   Basename 02/17/12 2007 02/17/12 1624 02/17/12 1210  GLUCAP 96 123* 112*    Assessment/Plan: S/P  Cont  Current care of chest infection-    LOS: 9 days chg VAC tomorrow   VAN TRIGT III,PETER 02/17/2012

## 2012-02-17 NOTE — Procedures (Signed)
Extubation Procedure Note  Patient Details:   Name: PARRISH BONN DOB: [birth year] MRN: [id number]   Airway Documentation:     Evaluation  O2 sats: stable throughout Complications: No apparent complications Patient did tolerate procedure well. Bilateral Breath Sounds: Clear Suctioning: Airway;Oral Yes  Pt extubated per MD order. Pt placed on 4L Nasal cannula.  Pt tolerating well at this time.  RT will continue to monitor.  Tylene Fantasia 02/17/2012, 10:03 AM

## 2012-02-17 NOTE — Progress Notes (Addendum)
Patient extubated and IV fentanyl and propofol discontinued.  20 ml of IV propofol and 225 ml of IV fentanyl wasted and verified with second RN, Zeb Comfort.

## 2012-02-17 NOTE — Progress Notes (Signed)
ANTIBIOTIC CONSULT NOTE - FOLLOW UP  Pharmacy Consult for Gentamicin Synergy Indication: MRSA bacteremia  No Known Allergies  Patient Measurements: Height: 5\' 10"  (177.8 cm) Weight: 186 lb 4.6 oz (84.5 kg) IBW/kg (Calculated) : 73   Vital Signs: Temp: 97.9 F (36.6 C) (01/26 2000) Temp src: Axillary (01/26 2000) BP: 150/87 mmHg (01/26 2200) Pulse Rate: 84  (01/26 2200) Intake/Output from previous day: 01/25 0701 - 01/26 0700 In: 4250.2 [I.V.:2585.2; NG/GT:715; IV Piggyback:950] Out: 3425 [Urine:2575; Emesis/NG output:650; Drains:200] Intake/Output from this shift: Total I/O In: 290 [I.V.:40; IV Piggyback:250] Out: 425 [Urine:425]  Labs:  [facility] 02/17/12 0401 02/16/12 0445 02/15/12 0450  WBC 15.0* 19.4* 21.8*  HGB 7.0* 7.6* 7.9*  PLT 601* 622* 574*  LABCREA -- -- --  CREATININE 0.65 0.65 0.68   Estimated Creatinine Clearance: 114.1 ml/min (by C-G formula based on Cr of 0.65).  Basename 02/17/12 1830 02/15/12 1853  VANCOTROUGH -- 17.4  VANCOPEAK -- --  Drue Dun -- --  GENTTROUGH 0.6 --  GENTPEAK -- --  GENTRANDOM -- --  TOBRATROUGH -- --  TOBRAPEAK -- --  TOBRARND -- --  AMIKACINPEAK -- --  AMIKACINTROU -- --  AMIKACIN -- --     Microbiology: Recent Results (from the past 720 hour(s))  TECHNOLOGIST REVIEW     Status: Normal   Collection Time   02/06/12  9:42 AM      Component Value Range Status Comment   Technologist Review Occ Metas and Myelocytes present   Final   CULTURE, BLOOD (ROUTINE X 2)     Status: Normal   Collection Time   02/08/12  3:39 PM      Component Value Range Status Comment   Specimen Description BLOOD RIGHT ANTECUBITAL   Final    Special Requests BOTTLES DRAWN AEROBIC AND ANAEROBIC 5CC   Final    Culture  Setup Time 02/08/2012 23:18   Final    Culture     Final    Value: METHICILLIN RESISTANT STAPHYLOCOCCUS AUREUS     Note: RIFAMPIN AND GENTAMICIN SHOULD NOT BE USED AS SINGLE DRUGS FOR TREATMENT OF STAPH INFECTIONS. This  organism DOES NOT demonstrate inducible Clindamycin resistance in vitro. CRITICAL RESULT CALLED TO, READ BACK BY AND VERIFIED WITH: MANDY ROLLS      02/10/12 @ 9:03PM BY RUSCA.     Note: Gram Stain Report Called to,Read Back By and Verified With: MEELY RICHARDSON 02/09/12 @ 2:26PM BY RUSCA.   Report Status 02/11/2012 FINAL   Final    Organism ID, Bacteria METHICILLIN RESISTANT STAPHYLOCOCCUS AUREUS   Final   CULTURE, BLOOD (ROUTINE X 2)     Status: Normal   Collection Time   02/08/12  3:49 PM      Component Value Range Status Comment   Specimen Description BLOOD LEFT HAND   Final    Special Requests BOTTLES DRAWN AEROBIC ONLY 2CC   Final    Culture  Setup Time 02/08/2012 23:19   Final    Culture     Final    Value: STAPHYLOCOCCUS AUREUS     Note: SUSCEPTIBILITIES PERFORMED ON PREVIOUS CULTURE WITHIN THE LAST 5 DAYS. CRITICAL RESULT CALLED TO, READ BACK BY AND VERIFIED WITH: MANDY ROLLS 02/10/12 @ 9:03PM BY RUSCA.     Note: Gram Stain Report Called to,Read Back By and Verified With: MEELY RICHARDSON 02/09/12 @ 2:26PM BY RUSCA.   Report Status 02/11/2012 FINAL   Final   URINE CULTURE     Status: Normal   Collection Time  02/08/12  4:58 PM      Component Value Range Status Comment   Specimen Description URINE, CATHETERIZED   Final    Special Requests NONE   Final    Culture  Setup Time 02/09/2012 01:48   Final    Colony Count NO GROWTH   Final    Culture NO GROWTH   Final    Report Status 02/10/2012 FINAL   Final   CULTURE, EXPECTORATED SPUTUM-ASSESSMENT     Status: Normal   Collection Time   02/08/12  5:54 PM      Component Value Range Status Comment   Specimen Description SPUTUM   Final    Special Requests NONE   Final    Sputum evaluation     Final    Value: THIS SPECIMEN IS ACCEPTABLE. RESPIRATORY CULTURE REPORT TO FOLLOW.   Report Status 02/08/2012 FINAL   Final   MRSA PCR SCREENING     Status: Abnormal   Collection Time   02/08/12  5:54 PM      Component Value Range Status Comment     MRSA by PCR POSITIVE (*) NEGATIVE Final   CULTURE, RESPIRATORY     Status: Normal   Collection Time   02/08/12  5:54 PM      Component Value Range Status Comment   Specimen Description SPUTUM   Final    Special Requests NONE   Final    Gram Stain     Final    Value: ABUNDANT WBC PRESENT,BOTH PMN AND MONONUCLEAR     RARE SQUAMOUS EPITHELIAL CELLS PRESENT     ABUNDANT GRAM POSITIVE COCCI     IN PAIRS IN CLUSTERS   Culture     Final    Value: ABUNDANT METHICILLIN RESISTANT STAPHYLOCOCCUS AUREUS     Note: RIFAMPIN AND GENTAMICIN SHOULD NOT BE USED AS SINGLE DRUGS FOR TREATMENT OF STAPH INFECTIONS. This organism DOES NOT demonstrate inducible Clindamycin resistance in vitro.   Report Status 02/12/2012 FINAL   Final    Organism ID, Bacteria METHICILLIN RESISTANT STAPHYLOCOCCUS AUREUS   Final   CULTURE, BLOOD (ROUTINE X 2)     Status: Normal   Collection Time   02/09/12  7:50 PM      Component Value Range Status Comment   Specimen Description Blood   Final    Special Requests NONE   Final    Culture  Setup Time 02/10/2012 02:13   Final    Culture NO GROWTH 5 DAYS   Final    Report Status 02/16/2012 FINAL   Final   CULTURE, BLOOD (ROUTINE X 2)     Status: Normal   Collection Time   02/09/12  7:55 PM      Component Value Range Status Comment   Specimen Description Blood   Final    Special Requests NONE   Final    Culture  Setup Time 02/10/2012 02:13   Final    Culture NO GROWTH 5 DAYS   Final    Report Status 02/16/2012 FINAL   Final   ANAEROBIC CULTURE     Status: Normal   Collection Time   02/10/12  4:32 PM      Component Value Range Status Comment   Specimen Description ABSCESS LEFT CHEST   Final    Special Requests PATIENT ON FOLLOWING ANCEF VANC   Final    Gram Stain     Final    Value: ABUNDANT WBC PRESENT, PREDOMINANTLY PMN     NO SQUAMOUS  EPITHELIAL CELLS SEEN     ABUNDANT GRAM POSITIVE COCCI     IN CLUSTERS   Culture NO ANAEROBES ISOLATED   Final    Report Status  02/15/2012 FINAL   Final   GRAM STAIN     Status: Normal   Collection Time   02/10/12  4:32 PM      Component Value Range Status Comment   Specimen Description ABSCESS LEFT CHEST   Final    Special Requests PATIENT ON FOLLOWING ANCEF VANC   Final    Gram Stain     Final    Value: ABUNDANT WBC PRESENT, PREDOMINANTLY PMN     ABUNDANT GRAM POSITIVE COCCI IN CLUSTERS     Gram Stain Report Called to,Read Back By and Verified With: P.WEATHERLY,RN 02/10/12 1725 EHOWARD   Report Status 02/10/2012 FINAL   Final   CULTURE, ROUTINE-ABSCESS     Status: Normal   Collection Time   02/10/12  4:34 PM      Component Value Range Status Comment   Specimen Description ABSCESS LEFT CHEST   Final    Special Requests PATIENT ON FOLLOWING VANC ANCEF   Final    Gram Stain     Final    Value: ABUNDANT WBC PRESENT, PREDOMINANTLY PMN     NO SQUAMOUS EPITHELIAL CELLS SEEN     ABUNDANT GRAM POSITIVE COCCI IN CLUSTERS     Gram Stain Report Called to,Read Back By and Verified With: Gram Stain Report Called to,Read Back By and Verified With: P WEATHERLY RN 02/10/12 1725 BY [id number] Performed at [facility]   Culture     Final    Value: ABUNDANT METHICILLIN RESISTANT STAPHYLOCOCCUS AUREUS     Note: RIFAMPIN AND GENTAMICIN SHOULD NOT BE USED AS SINGLE DRUGS FOR TREATMENT OF STAPH INFECTIONS. This organism DOES NOT demonstrate inducible Clindamycin resistance in vitro. CRITICAL RESULT CALLED TO, READ BACK BY AND VERIFIED WITH: SUSAN F 1/22 @      820 BY REAMM   Report Status 02/13/2012 FINAL   Final    Organism ID, Bacteria METHICILLIN RESISTANT STAPHYLOCOCCUS AUREUS   Final   GRAM STAIN     Status: Normal   Collection Time   02/14/12  5:25 PM      Component Value Range Status Comment   Specimen Description ABSCESS NECK LEFT   Final    Special Requests PATIENT ON FOLLOWING VANCOMYCIN   Final    Gram Stain     Final    Value: RARE WBC PRESENT, PREDOMINANTLY PMN     NO ORGANISMS SEEN     Gram Stain Report Called  to,Read Back By and Verified With: RN T. HARRELSON 02/14/12 1924 KERAN M.   Report Status 02/14/2012 FINAL   Final   ANAEROBIC CULTURE     Status: Normal (Preliminary result)   Collection Time   02/14/12  5:25 PM      Component Value Range Status Comment   Specimen Description ABSCESS NECK LEFT   Final    Special Requests PATIENT ON FOLLOWING VANCOMYCIN   Final    Gram Stain     Final    Value: RARE WBC PRESENT, PREDOMINANTLY PMN     NO ORGANISMS SEEN     Gram Stain Report Called to,Read Back By and Verified With: Gram Stain Report Called to,Read Back By and Verified With: RN T HARRELSON @18 :24 ON 02/14/12 BY Sallyanne Kuster M. Performed at [facility]   Culture     Final  Value: NO ANAEROBES ISOLATED; CULTURE IN PROGRESS FOR 5 DAYS   Report Status PENDING   Incomplete   CULTURE, ROUTINE-ABSCESS     Status: Normal   Collection Time   02/14/12  5:25 PM      Component Value Range Status Comment   Specimen Description ABSCESS NECK LEFT   Final    Special Requests PATIENT ON FOLLOWING VANCOMYCIN   Final    Gram Stain     Final    Value: RARE WBC PRESENT, PREDOMINANTLY PMN     NO ORGANISMS SEEN     Gram Stain Report Called to,Read Back By and Verified With: Gram Stain Report Called to,Read Back By and Verified With: RN T. HARRELSON @19 :24 ON 02/14/12 BY Sallyanne Kuster M. Performed at [facility]   Culture     Final    Value: FEW METHICILLIN RESISTANT STAPHYLOCOCCUS AUREUS     Note: RIFAMPIN AND GENTAMICIN SHOULD NOT BE USED AS SINGLE DRUGS FOR TREATMENT OF STAPH INFECTIONS. This organism DOES NOT demonstrate inducible Clindamycin resistance in vitro.   Report Status 02/17/2012 FINAL   Final    Organism ID, Bacteria METHICILLIN RESISTANT STAPHYLOCOCCUS AUREUS   Final      Assessment: 52 y.o Male with MRSA bacteremia, epidural and chest wall abcess for gentamicin  Goal of Therapy:  Gentamicin trough < 1  Plan:  Continue gent 80 mg IV q8h  Geannie Risen, PharmD, BCPS  02/17/2012 10:59  PM

## 2012-02-17 NOTE — Progress Notes (Signed)
PCCM Progress Note  Pt profile: 2M with recently discovered LUL/chest wall mass, admitted 1/17 with severe chest wall pain and hypoxemia, suspected PNA. Further history obtained 1/19: Pt developed  Small lesion on middle finger of L hand around 1/03. Developed severe upper L chest pain 1/08 and seen by primary MD 1/09. CXR normal @ that time. 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 Seen by Onc 1/15 and plans for eval of presumed lung cancer. Admitted via ED 1/17 with severe chest pain, hypoxemia, presumed PNA and continued presumption of LUL malignancy. Blood cultures drawn, abx started. Blood cx's turned positive on 1/18.   Studies/Events: 1/13 CT chest: Left upper lobe soft tissue mass anteriorly abutting the pleura  of 7.3 x 4.4 x 5.0 cm. There does appear to be surrounding infiltrate. This is worrisome for primary lung carcinoma. A portion of this soft tissue mass extends through the left anterior chest wall and involves the left pectoralis musculature. Multiple bilateral nodules worrisome for mets 1/19 concern for chest wall abscess rather than malignancy. Transferred to [facility] for TCTS eval 1/19 Irrigation and debridement L chest wall abscess, mini-thoracotomy, placement of VAC and L chest tube Tyrone Sage) 1/20 TTE >> normal LV fxn but grade 2 diastolic dysfxn, mild L atrial dilation, poor view of AV, no evidence MR 1/23 To OR for C2-C7 laminectomies for cord edema and compression 1/23 TEE >> no vegetations, no atrial thrombus  Lines, Tubes, etc: ETT 1/19 >> 1/26 L IJ CVL 1/19 >>  R radial A-line 1/19 >> 1/26  Microbiology: MRSA PCR 1/17 >> POSITIVE Resp 1/17 >> MRSA Urine 1/17 >> negative Blood 1/17 >> 2/2  MRSA  Blood 1/18 >> neg  Abscess 1/19 >> MRSA  Antibiotics:  Osletamivir 1/17 >> 1/19 Zosyn 1/17 >> 1/20 Vanc 1/17 >>   Consults:  TCTS ID Neuro NSGY  Best Practice: DVT: LMWH 1/18 SUP:  N/I Nutrition: Reg diet Glycemic control: N/I  Sedation/analgesia: propofol, fentanyl  Subj To OR 1/23 for urgent C2-C7 laminectomies for cord edema and compression Remains paraplegic w/ only gross motor  Passed SBT   Obj: Temp:  [99.3 F (37.4 C)-102.2 F (39 C)] 99.3 F (37.4 C) (01/26 0400) Pulse Rate:  [82-119] 88  (01/26 0900) Resp:  [13-27] 19  (01/26 0900) BP: (101-152)/(45-80) 122/55 mmHg (01/26 0825) SpO2:  [94 %-100 %] 98 % (01/26 0900) Arterial Line BP: (106-176)/(43-75) 131/53 mmHg (01/26 0900) FiO2 (%):  [40 %] 40 % (01/26 0900) Weight:  [84.5 kg (186 lb 4.6 oz)] 84.5 kg (186 lb 4.6 oz) (01/26 0400)  Intake/Output Summary (Last 24 hours) at 02/17/12 1009 Last data filed at 02/17/12 0900  Gross per 24 hour  Intake 3966.7 ml  Output   2675 ml  Net 1291.7 ml   Gen: chronically ill appearing HEENT: WNL, neck steristrips intact  Neck: Fullness in L side of neck and supraclav region. No discrete nodes Chest: L upper chest wound,  Diffuse scattered rhonchi Cardiac: tachy, reg, no M Abd: soft, NT, NABS Ext: warm, no edema, erythematous L middle finger with mild L forearm erythema Neuro: R 4mm, L 3mm and react, unable to move either UE or LE, sensation is intact. Moves head and neck, clearly follows commands  BMET  Lab 02/17/12 0401 02/16/12 0445 02/15/12 0450 02/14/12 0429 02/13/12 0422 02/12/12 0400  NA 145 147* 146* 145 144 --  K 3.4* 3.8 -- -- -- --  CL  112 111 111 112 110 --  CO2 29 29 29 28 26  --  GLUCOSE 126* 130* 134* 148* 125* --  BUN 22 18 18 21 21  --  CREATININE 0.65 0.65 0.68 0.75 0.70 --  CALCIUM 7.8* 8.0* 8.2* 8.1* 8.3* --  MG -- 2.2 2.3 -- -- 2.1  PHOS -- 3.2 3.0 -- -- 2.9    CBC  Lab 02/17/12 0401 02/16/12 0445 02/15/12 0450  HGB 7.0* 7.6* 7.9*  HCT 22.0* 24.4* 25.8*  WBC 15.0* 19.4* 21.8*  PLT 601* 622* 574*    Lab 02/16/12 0445 02/15/12 0450 02/13/12 0422 02/11/12 0359  PROCALCITON 0.29 0.34 0.75 2.81    CXR: 1/26 LUL opacity  resolving, stable B AS dz esp L base, L chest tube, no sig change when c/w 1/24 and 1/25  IMPRESSION: Patient Active Problem List  Diagnosis  . H/O HTN  . H/O hypercholesteremia  . H/O stroke  . Acute respiratory failure  . Hypoxemia  . Pain, chest wall  . Chest wall abscess  . Lung abscess  . Staphylococcus aureus bacteremia with sepsis  . Status post thoracotomy, 1/19 Tyrone Sage  . Paraplegia  . DVT of upper extremity (deep vein thrombosis)  . Quadriplegia   MRSA pulmonary abscess s/p VATS thoracotomy 1/19 paraspinal epidural abscess  s/p laminectomies, appreciate Dr Fredrich Birks help. Remains plegic w/ only gross motor movement in RUE, 1+hand grip on LUE and minimal st in lowers.  MRSA bacteremia and sepsis ventilator dependence: extubated 1/26 plan -Post-op care per N surg and thoracics -ABX per ID - Further debridement/dressing changes at bedside - ? Timing for repeat imaging, CT scan chest. Will discuss with Dr Tyrone Sage, depends on clinical course and CXR -focus on pulm hygiene now that extubated -advance diet at tol   LUE DVT noted 1/22; will hold restart of heparin gtt until cleared by N-surg. They would like to hold thru the weekend  Anemia  No evidence of bleeding Plan Pas Cont trend cbc Transfusion 1/26 per surgical team   Hypernatremia and Hypokalemia Plan BMET in AM. Replace K   CC time 35 min.  Patient seen and examined, agree with above note.  I dictated the care and orders written for this patient under my direction.  Alyson Reedy, MD [phone]

## 2012-02-17 NOTE — Progress Notes (Signed)
Pt tolerating wean well at this time.  Gave good effort with VC .98L.  SBI 39.  Will continue to monitor and consult MD for extubation.

## 2012-02-18 ENCOUNTER — Encounter (HOSPITAL_COMMUNITY): Payer: Self-pay | Admitting: Pulmonary Disease

## 2012-02-18 ENCOUNTER — Inpatient Hospital Stay (HOSPITAL_COMMUNITY): Payer: Medicare HMO

## 2012-02-18 DIAGNOSIS — G062 Extradural and subdural abscess, unspecified: Secondary | ICD-10-CM

## 2012-02-18 DIAGNOSIS — R509 Fever, unspecified: Secondary | ICD-10-CM

## 2012-02-18 LAB — GLUCOSE, CAPILLARY
Glucose-Capillary: 106 mg/dL — ABNORMAL HIGH (ref 70–99)
Glucose-Capillary: 111 mg/dL — ABNORMAL HIGH (ref 70–99)

## 2012-02-18 LAB — COMPREHENSIVE METABOLIC PANEL
ALT: 83 U/L — ABNORMAL HIGH (ref 0–53)
AST: 95 U/L — ABNORMAL HIGH (ref 0–37)
Albumin: 1.5 g/dL — ABNORMAL LOW (ref 3.5–5.2)
Alkaline Phosphatase: 96 U/L (ref 39–117)
BUN: 21 mg/dL (ref 6–23)
CO2: 24 mEq/L (ref 19–32)
Calcium: 8 mg/dL — ABNORMAL LOW (ref 8.4–10.5)
Chloride: 104 mEq/L (ref 96–112)
Creatinine, Ser: 0.5 mg/dL (ref 0.50–1.35)
GFR calc Af Amer: >90 mL/min (ref >90)
GFR calc non Af Amer: >90 mL/min (ref >90)
Glucose, Bld: 104 mg/dL — ABNORMAL HIGH (ref 70–99)
Potassium: 3.2 mEq/L — ABNORMAL LOW (ref 3.5–5.1)
Sodium: 139 mEq/L (ref 135–145)
Total Bilirubin: 0.7 mg/dL (ref 0.3–1.2)
Total Protein: 5.9 g/dL — ABNORMAL LOW (ref 6.0–8.3)

## 2012-02-18 LAB — TYPE AND SCREEN
ABO/RH(D): A NEG
Antibody Screen: NEGATIVE
Unit division: 0
Unit division: 0

## 2012-02-18 LAB — CBC
HCT: 28.8 % — ABNORMAL LOW (ref 39.0–52.0)
Hemoglobin: 9.9 g/dL — ABNORMAL LOW (ref 13.0–17.0)
MCH: 30.8 pg (ref 26.0–34.0)
MCHC: 34.4 g/dL (ref 30.0–36.0)
MCV: 89.7 fL (ref 78.0–100.0)
Platelets: 582 10*3/uL — ABNORMAL HIGH (ref 150–400)
RBC: 3.21 MIL/uL — ABNORMAL LOW (ref 4.22–5.81)
RDW: 14.1 % (ref 11.5–15.5)
WBC: 18.8 10*3/uL — ABNORMAL HIGH (ref 4.0–10.5)

## 2012-02-18 MED ORDER — FENTANYL CITRATE 0.05 MG/ML IJ SOLN
25.0000 ug | INTRAMUSCULAR | Status: DC | PRN
  Administered 2012-02-19: 50 ug via INTRAVENOUS
  Filled 2012-02-18: qty 2

## 2012-02-18 MED ORDER — SODIUM CHLORIDE 0.9 % IV SOLN
INTRAVENOUS | Status: DC
  Administered 2012-02-19: 04:00:00 via INTRAVENOUS

## 2012-02-18 MED ORDER — PANTOPRAZOLE SODIUM 40 MG PO TBEC
40.0000 mg | DELAYED_RELEASE_TABLET | Freq: Every day | ORAL | Status: DC
  Administered 2012-02-18 – 2012-02-20 (×3): 40 mg via ORAL
  Filled 2012-02-18 (×3): qty 1

## 2012-02-18 MED ORDER — WHITE PETROLATUM GEL
Status: DC | PRN
  Administered 2012-02-19: 1 via TOPICAL
  Administered 2012-02-20: 0.2 via TOPICAL
  Filled 2012-02-18: qty 5

## 2012-02-18 MED ORDER — TRAZODONE HCL 50 MG PO TABS
50.0000 mg | ORAL_TABLET | Freq: Every evening | ORAL | Status: DC | PRN
  Administered 2012-02-18 – 2012-02-23 (×5): 50 mg via ORAL
  Filled 2012-02-18 (×6): qty 1

## 2012-02-18 MED ORDER — POTASSIUM CHLORIDE 10 MEQ/50ML IV SOLN
10.0000 meq | INTRAVENOUS | Status: AC
  Administered 2012-02-18 (×2): 10 meq via INTRAVENOUS

## 2012-02-18 NOTE — Progress Notes (Signed)
INFECTIOUS DISEASE PROGRESS NOTE  ID: MARVION BASTIDAS is a 52 y.o. male with   Principal Problem:  *Staphylococcus aureus bacteremia with sepsis Active Problems:  H/O hypercholesteremia  H/O stroke  Acute respiratory failure  Chest wall abscess  Lung abscess  Status post thoracotomy, 1/19 - [facility]  Paraplegia  DVT of upper extremity (deep vein thrombosis)  Quadriplegia  Subjective: "peachy"  Abtx:  Anti-infectives     Start     Dose/Rate Route Frequency Ordered Stop   02/15/12 1130   gentamicin (GARAMYCIN) IVPB 100 mg  Status:  Discontinued        100 mg 200 mL/hr over 30 Minutes Intravenous Every 8 hours 02/15/12 1037 02/15/12 1042   02/15/12 1130   gentamicin (GARAMYCIN) IVPB 80 mg        80 mg 100 mL/hr over 30 Minutes Intravenous Every 8 hours 02/15/12 1042     02/14/12 1654   bacitracin 50,000 Units in sodium chloride irrigation 0.9 % 500 mL irrigation  Status:  Discontinued          As needed 02/14/12 1706 02/14/12 1822   02/12/12 2000   vancomycin (VANCOCIN) 1,250 mg in sodium chloride 0.9 % 250 mL IVPB        1,250 mg 166.7 mL/hr over 90 Minutes Intravenous Every 8 hours 02/12/12 1333     02/11/12 1100   vancomycin (VANCOCIN) IVPB 1000 mg/200 mL premix  Status:  Discontinued        1,000 mg 200 mL/hr over 60 Minutes Intravenous Every 8 hours 02/11/12 1042 02/12/12 1332   02/11/12 0045   vancomycin (VANCOCIN) IVPB 1000 mg/200 mL premix  Status:  Discontinued     Comments: CONTINUE TILL FURTHER NOTICE      1,000 mg 200 mL/hr over 60 Minutes Intravenous Every 12 hours 02/10/12 1803 02/11/12 1041   02/10/12 2200   ceFAZolin (ANCEF) IVPB 2 g/50 mL premix  Status:  Discontinued        2 g 100 mL/hr over 30 Minutes Intravenous 3 times per day 02/10/12 1610 02/10/12 1755   02/10/12 2200   piperacillin-tazobactam (ZOSYN) IVPB 3.375 g  Status:  Discontinued        3.375 g 12.5 mL/hr over 240 Minutes Intravenous 3 times per day 02/10/12 1834 02/11/12 1130   02/10/12 1400   vancomycin (VANCOCIN) IVPB 1000 mg/200 mL premix  Status:  Discontinued        1,000 mg 200 mL/hr over 60 Minutes Intravenous Every 8 hours 02/10/12 1111 02/10/12 1755   02/10/12 1200   cefTRIAXone (ROCEPHIN) 1 g in dextrose 5 % 50 mL IVPB  Status:  Discontinued        1 g 100 mL/hr over 30 Minutes Intravenous Every 12 hours 02/10/12 1108 02/10/12 1610   02/09/12 1800   vancomycin (VANCOCIN) IVPB 1000 mg/200 mL premix  Status:  Discontinued        1,000 mg 200 mL/hr over 60 Minutes Intravenous Every 12 hours 02/09/12 1318 02/10/12 1111   02/09/12 0600   vancomycin (VANCOCIN) 1,250 mg in sodium chloride 0.9 % 250 mL IVPB  Status:  Discontinued        1,250 mg 166.7 mL/hr over 90 Minutes Intravenous Every 24 hours 02/08/12 1720 02/09/12 1318   02/08/12 2200   piperacillin-tazobactam (ZOSYN) IVPB 3.375 g  Status:  Discontinued        3.375 g 12.5 mL/hr over 240 Minutes Intravenous 3 times per day 02/08/12 1649 02/10/12 1108  02/08/12 2000   oseltamivir (TAMIFLU) capsule 75 mg  Status:  Discontinued        75 mg Oral 2 times daily 02/08/12 1720 02/10/12 1108   02/08/12 1700   vancomycin (VANCOCIN) IVPB 1000 mg/200 mL premix  Status:  Discontinued        1,000 mg 200 mL/hr over 60 Minutes Intravenous Every 12 hours 02/08/12 1649 02/08/12 1720   02/08/12 1530   vancomycin (VANCOCIN) IVPB 1000 mg/200 mL premix        1,000 mg 200 mL/hr over 60 Minutes Intravenous  Once 02/08/12 1527 02/08/12 2000   02/08/12 1530  piperacillin-tazobactam (ZOSYN) IVPB 3.375 g       3.375 g 12.5 mL/hr over 240 Minutes Intravenous  Once 02/08/12 1527 02/08/12 2004          Medications:  Scheduled:   . ipratropium  0.5 mg Nebulization Q6H   And  . albuterol  2.5 mg Nebulization Q6H  . antiseptic oral rinse  1 application Mouth Rinse QID  . bisacodyl  10 mg Oral Daily  . chlorhexidine  15 mL Mouth Rinse BID  . gentamicin  80 mg Intravenous Q8H  . insulin aspart  2-6 Units  Subcutaneous Q4H  . pantoprazole sodium  40 mg Oral Daily  . sodium chloride  10-40 mL Intracatheter Q12H  . sodium chloride  3 mL Intravenous Q12H  . vancomycin  1,250 mg Intravenous Q8H    Objective: Vital signs in last 24 hours: Temp:  [97.9 F (36.6 C)-98.7 F (37.1 C)] 98.1 F (36.7 C) (01/27 0816) Pulse Rate:  [75-92] 82  (01/27 0800) Resp:  [22-31] 27  (01/27 0800) BP: (127-161)/(73-91) 142/78 mmHg (01/27 0800) SpO2:  [92 %-97 %] 95 % (01/27 0800) Arterial Line BP: (151-155)/(61-63) 151/61 mmHg (01/26 1100)   General appearance: alert, cooperative, no distress and frustrated Resp: clear to auscultation bilaterally Cardio: regular rate and rhythm GI: normal findings: bowel sounds normal and soft, non-tender Neurologic: Motor: is able to move toes (with help of his thigh muscles), moves fingers.   Lab Results  Basename 02/18/12 0400 02/17/12 0401  WBC 18.8* 15.0*  HGB 9.9* 7.0*  HCT 28.8* 22.0*  NA 139 145  K 3.2* 3.4*  CL 104 112  CO2 24 29  BUN 21 22  CREATININE 0.50 0.65  GLU -- --   Liver Panel  Basename 02/18/12 0400 02/17/12 0401  PROT 5.9* 5.6*  ALBUMIN 1.5* 1.3*  AST 95* 72*  ALT 83* 59*  ALKPHOS 96 93  BILITOT 0.7 0.4  BILIDIR -- --  IBILI -- --   Sedimentation Rate No results found for this basename: ESRSEDRATE in the last 72 hours C-Reactive Protein No results found for this basename: CRP:2 in the last 72 hours  Microbiology: Recent Results (from the past 240 hour(s))  CULTURE, BLOOD (ROUTINE X 2)     Status: Normal   Collection Time   02/08/12  3:39 PM      Component Value Range Status Comment   Specimen Description BLOOD RIGHT ANTECUBITAL   Final    Special Requests BOTTLES DRAWN AEROBIC AND ANAEROBIC 5CC   Final    Culture  Setup Time 02/08/2012 23:18   Final    Culture     Final    Value: METHICILLIN RESISTANT STAPHYLOCOCCUS AUREUS     Note: RIFAMPIN AND GENTAMICIN SHOULD NOT BE USED AS SINGLE DRUGS FOR TREATMENT OF STAPH  INFECTIONS. This organism DOES NOT demonstrate inducible Clindamycin resistance in vitro.  CRITICAL RESULT CALLED TO, READ BACK BY AND VERIFIED WITH: MANDY ROLLS      02/10/12 @ 9:03PM BY RUSCA.     Note: Gram Stain Report Called to,Read Back By and Verified With: MEELY RICHARDSON 02/09/12 @ 2:26PM BY RUSCA.   Report Status 02/11/2012 FINAL   Final    Organism ID, Bacteria METHICILLIN RESISTANT STAPHYLOCOCCUS AUREUS   Final   CULTURE, BLOOD (ROUTINE X 2)     Status: Normal   Collection Time   02/08/12  3:49 PM      Component Value Range Status Comment   Specimen Description BLOOD LEFT HAND   Final    Special Requests BOTTLES DRAWN AEROBIC ONLY 2CC   Final    Culture  Setup Time 02/08/2012 23:19   Final    Culture     Final    Value: STAPHYLOCOCCUS AUREUS     Note: SUSCEPTIBILITIES PERFORMED ON PREVIOUS CULTURE WITHIN THE LAST 5 DAYS. CRITICAL RESULT CALLED TO, READ BACK BY AND VERIFIED WITH: MANDY ROLLS 02/10/12 @ 9:03PM BY RUSCA.     Note: Gram Stain Report Called to,Read Back By and Verified With: MEELY RICHARDSON 02/09/12 @ 2:26PM BY RUSCA.   Report Status 02/11/2012 FINAL   Final   URINE CULTURE     Status: Normal   Collection Time   02/08/12  4:58 PM      Component Value Range Status Comment   Specimen Description URINE, CATHETERIZED   Final    Special Requests NONE   Final    Culture  Setup Time 02/09/2012 01:48   Final    Colony Count NO GROWTH   Final    Culture NO GROWTH   Final    Report Status 02/10/2012 FINAL   Final   CULTURE, EXPECTORATED SPUTUM-ASSESSMENT     Status: Normal   Collection Time   02/08/12  5:54 PM      Component Value Range Status Comment   Specimen Description SPUTUM   Final    Special Requests NONE   Final    Sputum evaluation     Final    Value: THIS SPECIMEN IS ACCEPTABLE. RESPIRATORY CULTURE REPORT TO FOLLOW.   Report Status 02/08/2012 FINAL   Final   MRSA PCR SCREENING     Status: Abnormal   Collection Time   02/08/12  5:54 PM      Component Value  Range Status Comment   MRSA by PCR POSITIVE (*) NEGATIVE Final   CULTURE, RESPIRATORY     Status: Normal   Collection Time   02/08/12  5:54 PM      Component Value Range Status Comment   Specimen Description SPUTUM   Final    Special Requests NONE   Final    Gram Stain     Final    Value: ABUNDANT WBC PRESENT,BOTH PMN AND MONONUCLEAR     RARE SQUAMOUS EPITHELIAL CELLS PRESENT     ABUNDANT GRAM POSITIVE COCCI     IN PAIRS IN CLUSTERS   Culture     Final    Value: ABUNDANT METHICILLIN RESISTANT STAPHYLOCOCCUS AUREUS     Note: RIFAMPIN AND GENTAMICIN SHOULD NOT BE USED AS SINGLE DRUGS FOR TREATMENT OF STAPH INFECTIONS. This organism DOES NOT demonstrate inducible Clindamycin resistance in vitro.   Report Status 02/12/2012 FINAL   Final    Organism ID, Bacteria METHICILLIN RESISTANT STAPHYLOCOCCUS AUREUS   Final   CULTURE, BLOOD (ROUTINE X 2)     Status: Normal   Collection Time  02/09/12  7:50 PM      Component Value Range Status Comment   Specimen Description Blood   Final    Special Requests NONE   Final    Culture  Setup Time 02/10/2012 02:13   Final    Culture NO GROWTH 5 DAYS   Final    Report Status 02/16/2012 FINAL   Final   CULTURE, BLOOD (ROUTINE X 2)     Status: Normal   Collection Time   02/09/12  7:55 PM      Component Value Range Status Comment   Specimen Description Blood   Final    Special Requests NONE   Final    Culture  Setup Time 02/10/2012 02:13   Final    Culture NO GROWTH 5 DAYS   Final    Report Status 02/16/2012 FINAL   Final   ANAEROBIC CULTURE     Status: Normal   Collection Time   02/10/12  4:32 PM      Component Value Range Status Comment   Specimen Description ABSCESS LEFT CHEST   Final    Special Requests PATIENT ON FOLLOWING ANCEF VANC   Final    Gram Stain     Final    Value: ABUNDANT WBC PRESENT, PREDOMINANTLY PMN     NO SQUAMOUS EPITHELIAL CELLS SEEN     ABUNDANT GRAM POSITIVE COCCI     IN CLUSTERS   Culture NO ANAEROBES ISOLATED   Final     Report Status 02/15/2012 FINAL   Final   GRAM STAIN     Status: Normal   Collection Time   02/10/12  4:32 PM      Component Value Range Status Comment   Specimen Description ABSCESS LEFT CHEST   Final    Special Requests PATIENT ON FOLLOWING ANCEF VANC   Final    Gram Stain     Final    Value: ABUNDANT WBC PRESENT, PREDOMINANTLY PMN     ABUNDANT GRAM POSITIVE COCCI IN CLUSTERS     Gram Stain Report Called to,Read Back By and Verified With: P.WEATHERLY,RN 02/10/12 1725 EHOWARD   Report Status 02/10/2012 FINAL   Final   CULTURE, ROUTINE-ABSCESS     Status: Normal   Collection Time   02/10/12  4:34 PM      Component Value Range Status Comment   Specimen Description ABSCESS LEFT CHEST   Final    Special Requests PATIENT ON FOLLOWING VANC ANCEF   Final    Gram Stain     Final    Value: ABUNDANT WBC PRESENT, PREDOMINANTLY PMN     NO SQUAMOUS EPITHELIAL CELLS SEEN     ABUNDANT GRAM POSITIVE COCCI IN CLUSTERS     Gram Stain Report Called to,Read Back By and Verified With: Gram Stain Report Called to,Read Back By and Verified With: P WEATHERLY RN 02/10/12 1725 BY [id number] Performed at [facility]   Culture     Final    Value: ABUNDANT METHICILLIN RESISTANT STAPHYLOCOCCUS AUREUS     Note: RIFAMPIN AND GENTAMICIN SHOULD NOT BE USED AS SINGLE DRUGS FOR TREATMENT OF STAPH INFECTIONS. This organism DOES NOT demonstrate inducible Clindamycin resistance in vitro. CRITICAL RESULT CALLED TO, READ BACK BY AND VERIFIED WITH: SUSAN F 1/22 @      820 BY REAMM   Report Status 02/13/2012 FINAL   Final    Organism ID, Bacteria METHICILLIN RESISTANT STAPHYLOCOCCUS AUREUS   Final   GRAM STAIN     Status: Normal  Collection Time   02/14/12  5:25 PM      Component Value Range Status Comment   Specimen Description ABSCESS NECK LEFT   Final    Special Requests PATIENT ON FOLLOWING VANCOMYCIN   Final    Gram Stain     Final    Value: RARE WBC PRESENT, PREDOMINANTLY PMN     NO ORGANISMS SEEN     Gram Stain  Report Called to,Read Back By and Verified With: RN T. HARRELSON 02/14/12 1924 KERAN M.   Report Status 02/14/2012 FINAL   Final   ANAEROBIC CULTURE     Status: Normal (Preliminary result)   Collection Time   02/14/12  5:25 PM      Component Value Range Status Comment   Specimen Description ABSCESS NECK LEFT   Final    Special Requests PATIENT ON FOLLOWING VANCOMYCIN   Final    Gram Stain     Final    Value: RARE WBC PRESENT, PREDOMINANTLY PMN     NO ORGANISMS SEEN     Gram Stain Report Called to,Read Back By and Verified With: Gram Stain Report Called to,Read Back By and Verified With: RN T HARRELSON @18 :24 ON 02/14/12 BY Sallyanne Kuster M. Performed at [facility]   Culture     Final    Value: NO ANAEROBES ISOLATED; CULTURE IN PROGRESS FOR 5 DAYS   Report Status PENDING   Incomplete   CULTURE, ROUTINE-ABSCESS     Status: Normal   Collection Time   02/14/12  5:25 PM      Component Value Range Status Comment   Specimen Description ABSCESS NECK LEFT   Final    Special Requests PATIENT ON FOLLOWING VANCOMYCIN   Final    Gram Stain     Final    Value: RARE WBC PRESENT, PREDOMINANTLY PMN     NO ORGANISMS SEEN     Gram Stain Report Called to,Read Back By and Verified With: Gram Stain Report Called to,Read Back By and Verified With: RN T. HARRELSON @19 :24 ON 02/14/12 BY Sallyanne Kuster M. Performed at [facility]   Culture     Final    Value: FEW METHICILLIN RESISTANT STAPHYLOCOCCUS AUREUS     Note: RIFAMPIN AND GENTAMICIN SHOULD NOT BE USED AS SINGLE DRUGS FOR TREATMENT OF STAPH INFECTIONS. This organism DOES NOT demonstrate inducible Clindamycin resistance in vitro.   Report Status 02/17/2012 FINAL   Final    Organism ID, Bacteria METHICILLIN RESISTANT STAPHYLOCOCCUS AUREUS   Final     Studies/Results: Dg Chest Port 1 View  02/18/2012  *RADIOLOGY REPORT*  Clinical Data: Extubated.  Follow-up chest tube.  PORTABLE CHEST - 1 VIEW  Comparison: 02/17/2012  Findings: Endotracheal tube has been  removed.  Left internal jugular central line has its tip in the SVC at the azygos level. Left-sided chest tube remains in place.  No pneumothorax.  Patchy pulmonary density persists particularly in the left upper lobe, left lower lobe and right lower lobe.  This appears similar or slightly more prominent.  No qualitatively new finding.  IMPRESSION: Endotracheal tube removed.  Left chest tube remains in place without pneumothorax.  Patchy pulmonary density, possibly slightly increased since yesterday's exam.   Original Report Authenticated By: Paulina Fusi, M.D.    Dg Chest Port 1 View  02/17/2012  *RADIOLOGY REPORT*  Clinical Data: Respiratory failure and status post evacuation of chest wall abscess with extension into the left hemithorax.  PORTABLE CHEST - 1 VIEW  Comparison: 02/16/2012  Findings: Endotracheal tube tip approximately 6 cm above the carina.  Central line positioning stable.  Lungs show stable chronic disease and patchy areas of residual atelectasis and infiltrates bilaterally.  Single left chest tube shows stable position.  No pneumothorax or significant pleural fluid identified.  IMPRESSION: Stable chest x-ray with residual patchy bilateral infiltrates.  No evidence of pneumothorax.   Original Report Authenticated By: Irish Lack, M.D.      Assessment/Plan: Fever  Epidural Abscess with paralysis  I & D 1-23  MRSA bacteremia, chest wall abscess  TEE (-)  Total days of antibiotics: 10 (vanco, gent day 4)   His Cr has been normal       Will stop gent as wanted only short dose.  His continued postive Cx from his neck while on therapy is somewhat concerning.  His neuro function has improved somewhat. Rehab eval... Temps improved.    Johny Sax Infectious Diseases [phone] 02/18/2012, 9:50 AM   LOS: 10 days

## 2012-02-18 NOTE — Progress Notes (Signed)
Subjective: Patient reports "My neck is just uncomfortable sometimes with positions. The only pain I have is right at my rectum. Weird."  Objective: Vital signs in last 24 hours: Temp:  [97.9 F (36.6 C)-98.7 F (37.1 C)] 98.1 F (36.7 C) (01/27 0816) Pulse Rate:  [75-92] 82  (01/27 0800) Resp:  [22-31] 27  (01/27 0800) BP: (127-161)/(73-91) 142/78 mmHg (01/27 0800) SpO2:  [92 %-97 %] 95 % (01/27 0800) Arterial Line BP: (151-155)/(61-63) 151/61 mmHg (01/26 1100)  Intake/Output from previous day: 01/26 0701 - 01/27 0700 In: 3178.9 [P.O.:720; I.V.:633.9; Blood:700; NG/GT:125; IV Piggyback:1000] Out: 3400 [Urine:2950; Emesis/NG output:450] Intake/Output this shift: Total I/O In: 70 [I.V.:20; IV Piggyback:50] Out: 325 [Urine:275; Drains:50]  Alert, conversant. Posterior cervical incision without erythema, swelling, or drainage. Steri's intact. Movement to command all extremities now, unable to lift extremities off pillows/bed, but significant improvement since Friday.   Lab Results:  Basename 02/18/12 0400 02/17/12 0401  WBC 18.8* 15.0*  HGB 9.9* 7.0*  HCT 28.8* 22.0*  PLT 582* 601*   BMET  Basename 02/18/12 0400 02/17/12 0401  NA 139 145  K 3.2* 3.4*  CL 104 112  CO2 24 29  GLUCOSE 104* 126*  BUN 21 22  CREATININE 0.50 0.65  CALCIUM 8.0* 7.8*    Studies/Results: Dg Chest Port 1 View  02/18/2012  *RADIOLOGY REPORT*  Clinical Data: Extubated.  Follow-up chest tube.  PORTABLE CHEST - 1 VIEW  Comparison: 02/17/2012  Findings: Endotracheal tube has been removed.  Left internal jugular central line has its tip in the SVC at the azygos level. Left-sided chest tube remains in place.  No pneumothorax.  Patchy pulmonary density persists particularly in the left upper lobe, left lower lobe and right lower lobe.  This appears similar or slightly more prominent.  No qualitatively new finding.  IMPRESSION: Endotracheal tube removed.  Left chest tube remains in place without  pneumothorax.  Patchy pulmonary density, possibly slightly increased since yesterday's exam.   Original Report Authenticated By: Paulina Fusi, M.D.    Dg Chest Port 1 View  02/17/2012  *RADIOLOGY REPORT*  Clinical Data: Respiratory failure and status post evacuation of chest wall abscess with extension into the left hemithorax.  PORTABLE CHEST - 1 VIEW  Comparison: 02/16/2012  Findings: Endotracheal tube tip approximately 6 cm above the carina.  Central line positioning stable.  Lungs show stable chronic disease and patchy areas of residual atelectasis and infiltrates bilaterally.  Single left chest tube shows stable position.  No pneumothorax or significant pleural fluid identified.  IMPRESSION: Stable chest x-ray with residual patchy bilateral infiltrates.  No evidence of pneumothorax.   Original Report Authenticated By: Irish Lack, M.D.     Assessment/Plan: Improving slowly  LOS: 10 days  Continue medical management. PT/OT. Rehab when medically appropriate. No special needs NU:[id number] incision/laminectomy. Wash daily with bathing, leave clean & dry.    Georgiann Cocker 02/18/2012, 9:42 AM

## 2012-02-18 NOTE — Clinical Social Work Note (Signed)
Clinical Social Work   CSW received a consult for SNF. CSW reviewed chart. Awaiting PT/OT evals for discharge recommendations. PT/OT evals are needed for prior auth for SNF placement. CSW will assess if appropriate. Please call with any questions. CSW will continue to follow.   Dede Query, MSW, Theresia Majors [phone]

## 2012-02-18 NOTE — Progress Notes (Signed)
Rehab Admissions Coordinator Note:  Patient was screened by Trish Mage for appropriateness for an Inpatient Acute Rehab Consult. Noted PT recommending CIR.   At this time, we are recommending Inpatient Rehab consult.  Trish Mage 02/18/2012, 4:01 PM  I can be reached at [phone].

## 2012-02-18 NOTE — Progress Notes (Addendum)
4 Days Post-Op Procedure(s) (LRB): POSTERIOR CERVICAL LAMINECTOMY FOR EPIDURAL ABSCESS (N/A) Subjective:  Mr. Telford states he is " peachy"  He is regaining some feeling back in his extremities.  Objective: Vital signs in last 24 hours: Temp:  [97.9 F (36.6 C)-98.7 F (37.1 C)] 98.1 F (36.7 C) (01/27 0816) Pulse Rate:  [75-91] 82  (01/27 0800) Cardiac Rhythm:  [-] Normal sinus rhythm (01/27 0900) Resp:  [22-31] 27  (01/27 0800) BP: (127-161)/(73-91) 142/78 mmHg (01/27 0800) SpO2:  [92 %-96 %] 95 % (01/27 0800)  Intake/Output from previous day: 01/26 0701 - 01/27 0700 In: 3178.9 [P.O.:720; I.V.:633.9; Blood:700; NG/GT:125; IV Piggyback:1000] Out: 3400 [Urine:2950; Emesis/NG output:450] Intake/Output this shift: Total I/O In: 300 [P.O.:120; I.V.:30; IV Piggyback:150] Out: 500 [Urine:450; Drains:50]  General appearance: alert, cooperative and no distress Heart: regular rate and rhythm Lungs: clear to auscultation bilaterally Abdomen: soft, non-tender; bowel sounds normal; no masses,  no organomegaly Wound: still with copious drainage present, doesnt appear purulent.  Wound bed is pink and clean no necrotic tissue present  Lab Results:  Basename 02/18/12 0400 02/17/12 0401  WBC 18.8* 15.0*  HGB 9.9* 7.0*  HCT 28.8* 22.0*  PLT 582* 601*   BMET:  Basename 02/18/12 0400 02/17/12 0401  NA 139 145  K 3.2* 3.4*  CL 104 112  CO2 24 29  GLUCOSE 104* 126*  BUN 21 22  CREATININE 0.50 0.65  CALCIUM 8.0* 7.8*    PT/INR: No results found for this basename: LABPROT,INR in the last 72 hours ABG    Component Value Date/Time   PHART 7.356 02/17/2012 0322   HCO3 27.7* 02/17/2012 0322   TCO2 29.1 02/17/2012 0322   ACIDBASEDEF 1.0 02/10/2012 1945   O2SAT 99.4 02/17/2012 0322   CBG (last 3)   Basename 02/18/12 0805 02/18/12 0403 02/18/12 0020  GLUCAP 106* 105* 96    Assessment/Plan: S/P Procedure(s) (LRB): POSTERIOR CERVICAL LAMINECTOMY FOR EPIDURAL ABSCESS (N/A)  1.  Chest wall abscess- continue Wound vac changes MWF 2. ID- continue ABX per recommendations 3. Care per medicine   LOS: 10 days    Lowella Dandy 02/18/2012   Consider removal of ct in am, after review of chest xray I have seen and examined Cleatis Polka and agree with the above assessment  and plan.  Delight Ovens MD Beeper [phone] Office [phone] 02/18/2012 2:34 PM

## 2012-02-18 NOTE — Consult Note (Signed)
WOC follow up Wound type: surgical debridement left upper chest wall Wound ZOX:WRUEA red, viable muscle, no necrotic tissue Drainage (amount, consistency, odor) heavy serosanguinous, non purulent  Periwound: intact Dressing procedure/placement/frequency: removed old dressing. 1pc white versafoam cut and fit into wound bed down in tunneled area at 3 o'clock, then covered with 1pc black granufoam cut to fit remainder of the wound bed. Draped and sealed at . Pt awake, given pain meds PO 1 hour prior to the dressing change. Pt tolerated without problems.  WOC will follow along with you for [facility] dressing assistance as needed. Lunabelle Oatley [place] RN,CWOCN [phone]

## 2012-02-18 NOTE — Progress Notes (Signed)
PCCM Progress Note  Pt profile: 70M with recently discovered LUL/chest wall mass, admitted 1/17 with severe chest wall pain and hypoxemia, suspected PNA. Further history obtained 1/19: Pt developed  Small lesion on middle finger of L hand around 1/03. Developed severe upper L chest pain 1/08 and seen by primary MD 1/09. CXR normal @ that time. 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 Seen by Onc 1/15 and plans for eval of presumed lung cancer. Admitted via ED 1/17 with severe chest pain, hypoxemia, presumed PNA and continued presumption of LUL malignancy. Blood cultures drawn, abx started. Blood cx's turned positive on 1/18.   Studies/Events: 1/13 CT chest: Left upper lobe soft tissue mass anteriorly abutting the pleura  of 7.3 x 4.4 x 5.0 cm. There does appear to be surrounding infiltrate. This is worrisome for primary lung carcinoma. A portion of this soft tissue mass extends through the left anterior chest wall and involves the left pectoralis musculature. Multiple bilateral nodules worrisome for mets 1/19 concern for chest wall abscess rather than malignancy. Transferred to [facility] for TCTS eval 1/19 Irrigation and debridement L chest wall abscess, mini-thoracotomy, placement of VAC and L chest tube Tyrone Sage) 1/20 TTE >> normal LV fxn but grade 2 diastolic dysfxn, mild L atrial dilation, poor view of AV, no evidence MR 1/23 To OR for C2-C7 laminectomies for cord edema and compression 1/23 TEE >> no vegetations, no atrial thrombus  Lines, Tubes, etc: ETT 1/19 >> 1/26 L IJ CVL 1/19 >>  R radial A-line 1/19 >> 1/26  Microbiology: MRSA PCR 1/17 >> POSITIVE Resp 1/17 >> MRSA Urine 1/17 >> negative Blood 1/17 >> 2/2  MRSA  Blood 1/18 >> neg  Abscess 1/19 >> MRSA  Antibiotics:  Osletamivir 1/17 >> 1/19 Zosyn 1/17 >> 1/20 Vanc 1/17 >>  Gentamycin 1/24 >>   Consults:  TCTS ID Neuro NSGY  Best  Practice: DVT: SCD SUP: Protonix Nutrition: Clear liquid  Subjective: Difficulty with sleep, and pain.  Breathing okay.  Objective: Temp:  [97.9 F (36.6 C)-98.7 F (37.1 C)] 98.1 F (36.7 C) (01/27 0816) Pulse Rate:  [75-92] 82  (01/27 0800) Resp:  [22-31] 27  (01/27 0800) BP: (127-161)/(73-91) 142/78 mmHg (01/27 0800) SpO2:  [92 %-97 %] 95 % (01/27 0800) Arterial Line BP: (151-155)/(61-63) 151/61 mmHg (01/26 1100)  Intake/Output Summary (Last 24 hours) at 02/18/12 0936 Last data filed at 02/18/12 0900  Gross per 24 hour  Intake 2965.4 ml  Output   3575 ml  Net -609.6 ml   Gen: No distress HEENT: Lt IJ site clean Chest: scattered rhonchi Cardiac: s1s2 regular Abd: soft, non tender Ext: 1+ edema Neuro: Moves hands/feet  BMET  Lab 02/18/12 0400 02/17/12 0401 02/16/12 0445 02/15/12 0450 02/14/12 0429 02/12/12 0400  NA 139 145 147* 146* 145 --  K 3.2* 3.4* -- -- -- --  CL 104 112 111 111 112 --  CO2 24 29 29 29 28  --  GLUCOSE 104* 126* 130* 134* 148* --  BUN 21 22 18 18 21  --  CREATININE 0.50 0.65 0.65 0.68 0.75 --  CALCIUM 8.0* 7.8* 8.0* 8.2* 8.1* --  MG -- -- 2.2 2.3 -- 2.1  PHOS -- -- 3.2 3.0 -- 2.9    CBC  Lab 02/18/12 0400 02/17/12 0401 02/16/12 0445  HGB 9.9* 7.0* 7.6*  HCT 28.8* 22.0* 24.4*  WBC 18.8* 15.0* 19.4*  PLT 582* 601* 622*  CXR:  Dg Chest Port 1 View  02/18/2012  *RADIOLOGY REPORT*  Clinical Data: Extubated.  Follow-up chest tube.  PORTABLE CHEST - 1 VIEW  Comparison: 02/17/2012  Findings: Endotracheal tube has been removed.  Left internal jugular central line has its tip in the SVC at the azygos level. Left-sided chest tube remains in place.  No pneumothorax.  Patchy pulmonary density persists particularly in the left upper lobe, left lower lobe and right lower lobe.  This appears similar or slightly more prominent.  No qualitatively new finding.  IMPRESSION: Endotracheal tube removed.  Left chest tube remains in place without pneumothorax.   Patchy pulmonary density, possibly slightly increased since yesterday's exam.   Original Report Authenticated By: Paulina Fusi, M.D.    Dg Chest Port 1 View  02/17/2012  *RADIOLOGY REPORT*  Clinical Data: Respiratory failure and status post evacuation of chest wall abscess with extension into the left hemithorax.  PORTABLE CHEST - 1 VIEW  Comparison: 02/16/2012  Findings: Endotracheal tube tip approximately 6 cm above the carina.  Central line positioning stable.  Lungs show stable chronic disease and patchy areas of residual atelectasis and infiltrates bilaterally.  Single left chest tube shows stable position.  No pneumothorax or significant pleural fluid identified.  IMPRESSION: Stable chest x-ray with residual patchy bilateral infiltrates.  No evidence of pneumothorax.   Original Report Authenticated By: Irish Lack, M.D.     CBG (last 3)   Basename 02/18/12 0805 02/18/12 0403 02/18/12 0020  GLUCAP 106* 105* 96     IMPRESSION:  MRSA pulmonary abscess s/p VATS thoracotomy 1/19 complicated by bacteremia, sepsis, and paraspinal epidural abscess  s/p laminectomies.Marland Kitchen  PLAN: Change IJ CVL to PICC line for long term Abx PT/OT Post-op care per TCTS and neurosurgery Abx per ID F/u CXR 1/28  Lt upper extremitity DVT noted 02/13/12. Heparin held for neurosurgery. PLAN: Resume heparin gtt when okay with neurosurgery  >> will need to transition to coumadin at some point Continue SCD's for now  Dysphagia. PLAN: Continue clear liquid diet for now Speech therapy to assess swallowing  Anemia of critical illness. Transfused 1/26 prior to surgery. PLAN: F/u CBC Transfuse for Hb < 7  Hypokalemia. PLAN: F/u and replace electrolytes as needed  Pain control, insomnia. PLAN: Prn trazodone qhs for sleep Tylenol, oxy-IR, IV fentanyl prn depending on pain level  Hyperglycemia. No hx of DM. PLAN: D/c dextrose from IV fluid Check HbA1C D/c SSI  Resolved problems >> Acute respiratory  failure, hypernatremia.  Transfer to SDU.  Will ask [facility] to assume care from 1/28 and PCCM sign off.  Coralyn Helling, MD [facility] Pulmonary/Critical Care 02/18/2012, 10:02 AM Pager:  [phone] After 3pm call: [phone]

## 2012-02-18 NOTE — Evaluation (Signed)
Clinical/Bedside Swallow Evaluation Patient Details  Name: RAYEN DAFOE MRN: [id number] Date of Birth: 09-28-1960  Today's Date: 02/18/2012 Time: 7829-5621 SLP Time Calculation (min): 23 min  Past Medical History:  Past Medical History  Diagnosis Date  . Hypercholesteremia 02/06/2012  . Stroke 02/06/2012    09/13 secondary to hypertensive crisis.   Past Surgical History:  Past Surgical History  Procedure Date  . Inguinal hernia repair   . Irrigation and debridement abscess 02/10/2012    Procedure: IRRIGATION AND DEBRIDEMENT ABSCESS;  Surgeon: Delight Ovens, MD;  Location: [facility] OR;  Service: Thoracic;  Laterality: Left;  . Thoracotomy 02/10/2012    Procedure: MINI/LIMITED THORACOTOMY;  Surgeon: Delight Ovens, MD;  Location: [facility] OR;  Service: Thoracic;  Laterality: Left;  . Chest exploration 02/11/2012    Procedure: CHEST EXPLORATION;  Surgeon: Delight Ovens, MD;  Location: [facility] OR;  Service: Thoracic;  Laterality: Left;  re-exploration of left chest wall; with wound vac change  . Minor application of wound vac 02/11/2012    Procedure: MINOR APPLICATION OF WOUND VAC;  Surgeon: Delight Ovens, MD;  Location: [facility] OR;  Service: Thoracic;  Laterality: Left;  wound vac change  . Posterior cervical laminectomy for epidural abscess 02/14/2012    Procedure: POSTERIOR CERVICAL LAMINECTOMY FOR EPIDURAL ABSCESS;  Surgeon: Maeola Harman, MD;  Location: [facility] NEURO ORS;  Service: Neurosurgery;  Laterality: N/A;  Cervical Laminectomy  for Epidural Abscess   HPI:  72M with recently discovered LUL/chest wall mass, admitted 1/17 with severe chest wall pain and hypoxemia, suspected PNA. Further history obtained 1/19: Pt developed  Small lesion on middle finger of L hand around 1/03. Developed severe upper L chest pain 1/08 and seen by primary MD 1/09. CXR normal @ that time. 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 Seen by  Onc 1/15 and plans for eval of presumed lung cancer. Admitted via ED 1/17 with severe chest pain, hypoxemia, presumed PNA and continued presumption of LUL malignancy. Blood cultures drawn, abx started. Blood cx's turned positive on 1/18. Pt found to have L chest wall abscess, MRSA sepsis. Underwent mini-thoracotomy, placement of VAC and L chest tube. 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 On 1/23 To OR for C2-C7 posterior laminectomies for cord edema and compression   Assessment / Plan / Recommendation Clinical Impression  Pt presents with appearance of adequate swallow function. Pt did not demonstrate any overt signs of aspiration. Pt did have congested cough prior to PO, and exhibited 2 coughing episodes after bites of jello. Consumption of thin liquids was otherwise normal - timely, good laryngeal elevation, dry vocal quality. There was no evidence of residual or significant pharyngeal edema. Vocal quality WNL. Pt is safe to consume a regular diet and thin liquids. Will defer upgrade to regular texture to MD.     Aspiration Risk  Mild    Diet Recommendation Regular;Thin liquid   Liquid Administration via: Cup;Straw Medication Administration: Whole meds with liquid Supervision: Full supervision/cueing for compensatory strategies Postural Changes and/or Swallow Maneuvers: Seated upright 90 degrees    Other  Recommendations Oral Care Recommendations: Oral care BID   Follow Up Recommendations       Frequency and Duration        Pertinent Vitals/Pain NA    SLP Swallow Goals     Swallow Study Prior Functional Status  Type of Home: House Lives With: Family Available Help at Discharge: Family (?  wife or mother--pt unclear) Vocation: Unemployed    General HPI: 32M with recently discovered LUL/chest wall mass, admitted 1/17 with severe chest wall pain and hypoxemia, suspected PNA. Further history obtained 1/19: Pt developed  Small lesion on middle finger of L hand  around 1/03. Developed severe upper L chest pain 1/08 and seen by primary MD 1/09. CXR normal @ that time. 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 Seen by Onc 1/15 and plans for eval of presumed lung cancer. Admitted via ED 1/17 with severe chest pain, hypoxemia, presumed PNA and continued presumption of LUL malignancy. Blood cultures drawn, abx started. Blood cx's turned positive on 1/18. Pt found to have L chest wall abscess, MRSA sepsis. Underwent mini-thoracotomy, placement of VAC and L chest tube. 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 On 1/23 To OR for C2-C7 posterior laminectomies for cord edema and compression Type of Study: Bedside swallow evaluation Diet Prior to this Study: Thin liquids Temperature Spikes Noted: No Respiratory Status: Room air History of Recent Intubation: Yes Length of Intubations (days): 8 days Date extubated: 02/17/12 Behavior/Cognition: Alert;Cooperative;Pleasant mood Oral Cavity - Dentition: Adequate natural dentition Self-Feeding Abilities: Total assist Patient Positioning: Upright in bed Baseline Vocal Quality: Clear Volitional Cough: Strong Volitional Swallow: Able to elicit    Oral/Motor/Sensory Function Overall Oral Motor/Sensory Function: Appears within functional limits for tasks assessed   Ice Chips     Thin Liquid Thin Liquid: Within functional limits Presentation: Cup;Straw    Nectar Thick Nectar Thick Liquid: Not tested   Honey Thick Honey Thick Liquid: Not tested   Puree Puree: Within functional limits   Solid   GO    Solid: Within functional limits       Knute Mazzuca, Riley Nearing 02/18/2012,4:35 PM

## 2012-02-18 NOTE — Progress Notes (Signed)
Occupational Therapy Evaluation Patient Details Name: Frank Brock MRN: [id number] DOB: [birth year] Today's Date: 02/18/2012 Time: 4098-1191 OT Time Calculation (min): 52 min  OT Assessment / Plan / Recommendation Clinical Impression  52 yo with admission due to confusion, neck pain and SOB. Pt with lung mass - lung abscess. Underwent thoracotomy due to lung abscess and L empyema from MRSA. Posterior cervical laminectomy for epidural abscess with resulting quadriplegia. intubated/extubated. chest tube. wound vac. Pt will benefit from skilled OT services due to below deficits to max independence with ADL and functional mobility for ADL to facilitate D/C to next venue of care. Rec CIR. Pt has supportive family who can modify his home if needed. Pis very motivated to return to PLOF.   Rec using soft collar to support neck during therapy.    OT Assessment  Patient needs continued OT Services    Follow Up Recommendations  CIR    Barriers to Discharge None    Equipment Recommendations  3 in 1 bedside comode;Tub/shower bench;Hospital bed;Wheelchair (measurements OT);Wheelchair cushion (measurements OT)    Recommendations for Other Services Rehab consult  Frequency  Min 3X/week    Precautions / Restrictions Precautions Precautions: Cervical;Fall;Other (comment) Precaution Comments: watch BP Required Braces or Orthoses:  (none) Restrictions Weight Bearing Restrictions: No   Pertinent Vitals/Pain 8. Neck pain. nsg aware    ADL  Eating/Feeding: +1 Total assistance Where Assessed - Eating/Feeding: Bed level Grooming: +1 Total assistance Where Assessed - Grooming: Supported sitting Upper Body Bathing: +1 Total assistance Where Assessed - Upper Body Bathing: Supported sitting Lower Body Bathing: +2 Total assistance Where Assessed - Lower Body Bathing: Supine, head of bed up;Rolling right and/or left Upper Body Dressing: +1 Total assistance Where Assessed - Upper Body Dressing:  Supported sitting Lower Body Dressing: +2 Total assistance Where Assessed - Lower Body Dressing: Supine, head of bed up;Rolling right and/or left Toilet Transfer:  (not assessed) Transfers/Ambulation Related to ADLs: not assessed at this time. Not medically ready. ADL Comments: total A at this time    OT Diagnosis: Generalized weakness;Altered mental status;Paresis;Acute pain  OT Problem List: Decreased strength;Decreased range of motion;Decreased activity tolerance;Impaired balance (sitting and/or standing);Decreased coordination;Decreased safety awareness;Decreased knowledge of use of DME or AE;Decreased knowledge of precautions;Cardiopulmonary status limiting activity;Impaired sensation;Impaired UE functional use;Pain;Increased edema OT Treatment Interventions: Self-care/ADL training;Therapeutic exercise;Neuromuscular education;Energy conservation;DME and/or AE instruction;Therapeutic activities;Cognitive remediation/compensation;Patient/family education;Balance training   OT Goals Acute Rehab OT Goals OT Goal Formulation: With patient Time For Goal Achievement: 03/03/12 Potential to Achieve Goals: Good ADL Goals Pt Will Perform Eating: with mod assist;Supported;with assist to don/doff brace/orthosis;with adaptive utensils;with cueing (comment type and amount) (mod tactile cues) ADL Goal: Eating - Progress: Goal set today Pt Will Perform Grooming: with mod assist;Supported;with adaptive equipment;with cueing (comment type and amount) (min vc) ADL Goal: Grooming - Progress: Goal set today Pt Will Perform Upper Body Bathing: with mod assist;Supine, rolling right and/or left;Supine, head of bed up;Supported ADL Goal: Upper Body Bathing - Progress: Goal set today Arm Goals Additional Arm Goal #1: Pt will independently instruct caregivers to keep BUE elevated to decrease dependent edema Arm Goal: Additional Goal #1 - Progress: Goal set today Additional Arm Goal #2: Pt will demonstrate hand to  mouth pattern with BUE in suported sitting Arm Goal: Additional Goal #2 - Progress: Goal set today Miscellaneous OT Goals Miscellaneous OT Goal #1: Pt will maintain sitting EOB x 10 min with min A OT Goal: Miscellaneous Goal #1 - Progress: Goal set today  Visit Information  Last OT Received On: 02/18/12 Assistance Needed: +2 PT/OT Co-Evaluation/Treatment: Yes    Subjective Data   will you come back tomorrow?   Prior Functioning     Home Living Lives With: Family Available Help at Discharge: Family (?wife or mother--pt unclear) Type of Home: House Home Access: Stairs to enter Secretary/administrator of Steps: 2 Home Layout: One level Bathroom Shower/Tub: Associate Professor:  (unsure) Home Adaptive Equipment: None Additional Comments: pt reports family is in [employer] business and can make home modifications he will need Prior Function Level of Independence: Independent Able to Take Stairs?: Reciprically Driving: Yes Vocation: Unemployed Comments: laid off in [place] after his stroke Communication Communication: No difficulties Dominant Hand: Right         Vision/Perception Perception Perception: Within Functional Limits Praxis Praxis: Intact   Cognition  Overall Cognitive Status: Impaired Area of Impairment: Attention;Other (comment);Awareness of deficits (internally distracted - ?due to meds/ ICU/ situational stres) Arousal/Alertness: Awake/alert Orientation Level: Appears intact for tasks assessed Behavior During Session: [facility] for tasks performed Current Attention Level: Selective Attention - Other Comments: cues to maintain on topic Awareness of Deficits: asked if he would going to outpt therapy next week    Extremity/Trunk Assessment Right Upper Extremity Assessment RUE ROM/Strength/Tone: Deficits RUE ROM/Strength/Tone Deficits: shoulder @ 2/5. elbow 2+/5 flex. 2-/5 ext. wrist flex 2_. wrist ext 2+.  unable to achieve gross grasp. unable to achieve full extension (thumb movement observed) RUE Sensation: Deficits RUE Sensation Deficits: decreased sensation throughout RUE below shoulder level RUE Coordination: Deficits RUE Coordination Deficits: unable to make fist Left Upper Extremity Assessment LUE ROM/Strength/Tone: Deficits LUE ROM/Strength/Tone Deficits: greater proximal weakness than RUE. . shoulder @ 1+/5. elbow @ 2-/flex. ext 2-. gross grasp. unable to achieve full extension. unable to oppose  LUE Sensation: Deficits LUE Sensation Deficits: decreased sensation below shoulder level LUE Coordination: Deficits LUE Coordination Deficits: nonfunctional grasp Right Lower Extremity Assessment RLE ROM/Strength/Tone: Deficits RLE ROM/Strength/Tone Deficits: AAROM WFL; hip flexion 2+, hip ext 3+, hip abdct 2+, hip add 2+; knee ext 2+; ankle DF 2+, ankle PF 3- RLE Sensation: Deficits RLE Sensation Deficits: numbness throughout, however can detect light touch in toes RLE Coordination: Deficits Left Lower Extremity Assessment LLE ROM/Strength/Tone: Deficits LLE ROM/Strength/Tone Deficits: AAROM WFL; hip flexion 2+, hip ext 3+, hip abdct 2+, hip add 2+; knee ext 2+; ankle DF 2+, ankle PF 3- LLE Sensation: Deficits LLE Sensation Deficits: numbness throughout, however can detect light touch in toes LLE Coordination: Deficits Trunk Assessment Trunk Assessment:  (unable to maintain upright posture) Trunk Exceptions: trunk flexors <2/5; extensors 2+/5     Mobility Bed Mobility Bed Mobility: Rolling Right;Rolling Left;Supine to Sit;Sitting - Scoot to [place] of Bed;Sit to Sidelying Left;Scooting to [facility] Rolling Right: 1: +2 Total assist Rolling Right: Patient Percentage: 10% Rolling Left: 1: +2 Total assist Rolling Left: Patient Percentage: 10% Supine to Sit: 1: +2 Total assist;HOB elevated (HOB 45) Supine to Sit: Patient Percentage: 10% Sitting - Scoot to Edge of Bed: 1: +2 Total  assist Sitting - Scoot to Edge of Bed: Patient Percentage: 0% Sit to Sidelying Left: 1: +2 Total assist;HOB flat Sit to Sidelying Left: Patient Percentage: 10% Scooting to HOB: 1: +2 Total assist Scooting to [facility]: Patient Percentage: 0% Details for Bed Mobility Assistance: pt able to assist with initiating movements,however too weak to execute     Shoulder Instructions     Exercise General Exercises - Upper Extremity Shoulder Flexion: AAROM Shoulder ABduction: AAROM Shoulder  ADduction: AAROM Shoulder Horizontal ABduction: AAROM Shoulder Horizontal ADduction: AAROM Elbow Flexion: AAROM Elbow Extension: AAROM Wrist Flexion: AAROM Wrist Extension: AAROM Digit Composite Flexion: AAROM Composite Extension: AAROM General Exercises - Lower Extremity Ankle Circles/Pumps: AAROM;Both;5 reps;Supine Heel Slides: AAROM;Both;5 reps;Supine Hip ABduction/ADduction: AAROM;Both;Other reps (comment);Supine   Balance Balance Balance Assessed: Yes Static Sitting Balance Static Sitting - Balance Support: Bilateral upper extremity supported;Feet supported Static Sitting - Level of Assistance: 2: Max assist Static Sitting - Comment/# of Minutes: 10 (required erst breaks to lean post on therapist) Dynamic Sitting Balance Dynamic Sitting - Balance Support: Right upper extremity supported;Left upper extremity supported;Feet supported Dynamic Sitting - Level of Assistance: 2: Max assist Dynamic Sitting - Comments: able to assist with pushing up to midline from R/L using BUE   End of Session OT - End of Session Activity Tolerance: Patient tolerated treatment well Patient left: in bed;with call bell/phone within reach;with nursing in room Nurse Communication: Mobility status  GO     Dynasti Kerman,HILLARY 02/18/2012, 5:16 PM [facility], OTR/L  [phone] 02/18/2012

## 2012-02-18 NOTE — Evaluation (Signed)
Physical Therapy Evaluation Patient Details Name: Frank Brock MRN: [id number] DOB: 12/10/1960 Today's Date: 02/18/2012 Time: 7829-5621 PT Time Calculation (min): 52 min  PT Assessment / Plan / Recommendation Clinical Impression  52 yo adm with chest pain and diagnosed with chest wall abscess requiring mulitiple I&D's and wound VAC. Pt developed LUE pain and bil UE weakness and found to have C3-C7 epidural abscess. Underwent a decompressive laminectomy. Pt with incomplete quadraplegia and will benefit from PT to incr safety with mobility to prepare for d/c home with family.    PT Assessment  Patient needs continued PT services    Follow Up Recommendations  CIR;Supervision/Assistance - 24 hour    Does the patient have the potential to tolerate intense rehabilitation     yes  Barriers to Discharge Inaccessible home environment;Other (comment) unknown caregiver support    Equipment Recommendations  Other (comment) (TBA)    Recommendations for Other Services Rehab consult   Frequency Min 4X/week    Precautions / Restrictions Precautions Precautions: Cervical;Fall;Other (comment) (multiple lines, VAC Lt chest) Required Braces or Orthoses:  (none) Restrictions Weight Bearing Restrictions: No   Pertinent Vitals/Pain Denied pain while supine; once sitting, reported neck pain 8/10 On return to supine, neck pain down to 6/10 BP stable as elevated HOB from 20 to 30 to 45 degrees and as turned to dangle at EOB HR 80s, SaO2 >95%, RR 22-35 while EOB     Mobility  Bed Mobility Bed Mobility: Rolling Right;Rolling Left;Supine to Sit;Sitting - Scoot to [place] of Bed;Sit to Sidelying Left;Scooting to [facility] Rolling Right: 1: +2 Total assist Rolling Right: Patient Percentage: 10% Rolling Left: 1: +2 Total assist Rolling Left: Patient Percentage: 10% Supine to Sit: 1: +2 Total assist;HOB elevated (HOB 45) Supine to Sit: Patient Percentage: 10% Sitting - Scoot to Edge of Bed: 1: +2 Total  assist Sitting - Scoot to Edge of Bed: Patient Percentage: 0% Sit to Sidelying Left: 1: +2 Total assist;HOB flat Sit to Sidelying Left: Patient Percentage: 10% Scooting to HOB: 1: +2 Total assist Scooting to [facility]: Patient Percentage: 0% Details for Bed Mobility Assistance: pt able to assist with initiating movements,however too weak to execute Transfers Transfers: Not assessed Ambulation/Gait Ambulation/Gait Assistance: Not tested (comment)         Exercises General Exercises - Lower Extremity Ankle Circles/Pumps: AAROM;Both;5 reps;Supine Heel Slides: AAROM;Both;5 reps;Supine Hip ABduction/ADduction: AAROM;Both;Other reps (comment);Supine   PT Diagnosis: Quadraplegia  PT Problem List: Decreased strength;Decreased activity tolerance;Decreased balance;Decreased mobility;Decreased knowledge of use of DME;Decreased safety awareness;Decreased knowledge of precautions;Impaired sensation;Impaired tone;Pain PT Treatment Interventions: DME instruction;Functional mobility training;Therapeutic activities;Therapeutic exercise;Balance training;Neuromuscular re-education;Patient/family education;Other (comment) (pre-gait training)   PT Goals Acute Rehab PT Goals PT Goal Formulation: With patient Time For Goal Achievement: 03/03/12 Potential to Achieve Goals: Good Pt will Roll Supine to Right Side: with mod assist;with rail PT Goal: Rolling Supine to Right Side - Progress: Goal set today Pt will Roll Supine to Left Side: with mod assist;with rail PT Goal: Rolling Supine to Left Side - Progress: Goal set today Pt will go Supine/Side to Sit: with +2 total assist;with HOB not 0 degrees (comment degree);with rail (HOB <45; Pt=40%) PT Goal: Supine/Side to Sit - Progress: Goal set today Pt will Sit at [facility] of Bed: with min assist;1-2 min;with bilateral upper extremity support PT Goal: Sit at Edge Of Bed - Progress: Goal set today Pt will go Sit to Supine/Side: with mod assist;with HOB 0 degrees;with  rail PT Goal: Sit to Supine/Side - Progress: Goal  set today Pt will Transfer Bed to Chair/Chair to Bed: with +2 total assist (p=40%) PT Transfer Goal: Bed to Chair/Chair to Bed - Progress: Goal set today Pt will Perform Home Exercise Program: with min assist (and self-direct caregivers to assist him) PT Goal: Perform Home Exercise Program - Progress: Goal set today Pt will Demo (or direct caregiver to perform) Pressure Relief x1 min every 30 minutes : Independently PT Goal: Demo/Caregiver Perform Pressure Relief - Progress: Goal set today  Visit Information  Last PT Received On: 02/18/12 Assistance Needed: +2 PT/OT Co-Evaluation/Treatment: Yes    Subjective Data  Subjective: "Will I be going to OP therapy by next week?" Patient Stated Goal: return to [place]; play golf   Prior Functioning  Home Living Lives With: Family Available Help at Discharge: Family (?wife or mother--pt unclear) Type of Home: House Home Access: Stairs to enter Secretary/administrator of Steps: 2 Home Layout: One level Bathroom Shower/Tub: Associate Professor:  (unsure) Home Adaptive Equipment: None Additional Comments: pt reports family is in [employer] business and can make home modifications he will need Prior Function Level of [place]: [place] Able to Take Stairs?: Reciprically Driving: Yes Vocation: Unemployed Comments: per pt, 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 Communication Communication: No difficulties Dominant Hand: Right    Cognition  Overall Cognitive Status: Impaired Area of Impairment: Attention;Awareness of deficits Arousal/Alertness: Awake/alert Behavior During Session: [facility] for tasks performed Current Attention Level: Selective Awareness of Deficits: see comments re: going to OPPT next week    Extremity/Trunk Assessment Right Lower Extremity Assessment RLE  ROM/Strength/Tone: Deficits RLE ROM/Strength/Tone Deficits: AAROM WFL; hip flexion 2+, hip ext 3+, hip abdct 2+, hip add 2+; knee ext 2+; ankle DF 2+, ankle PF 3- RLE Sensation: Deficits RLE Sensation Deficits: numbness throughout, however can detect light touch in toes Left Lower Extremity Assessment LLE ROM/Strength/Tone: Deficits LLE ROM/Strength/Tone Deficits: AAROM WFL; hip flexion 2+, hip ext 3+, hip abdct 2+, hip add 2+; knee ext 2+; ankle DF 2+, ankle PF 3- LLE Sensation: Deficits LLE Sensation Deficits: numbness throughout, however can detect light touch in toes Trunk Assessment Trunk Assessment: Other exceptions Trunk Exceptions: trunk flexors <2/5; extensors 2+/5   Balance Balance Balance Assessed: Yes Static Sitting Balance Static Sitting - Balance Support: Bilateral upper extremity supported;Feet supported Static Sitting - Level of Assistance: 2: Max assist Static Sitting - Comment/# of Minutes: sat EOB x 10 minutes; at times able to hold his head upright himself, however needed frequent rest breaks (reclined against OT behind him) due to neck pain;  Dynamic Sitting Balance Dynamic Sitting - Balance Support: Right upper extremity supported;Left upper extremity supported;Feet supported Dynamic Sitting - Level of Assistance: 2: Max assist Dynamic Sitting - Comments: leaning Lt and Rt to elicit pushing back to midline with each UE  End of Session PT - End of Session Activity Tolerance: Patient limited by pain Patient left: in bed;with call bell/phone within reach Nurse Communication: Mobility status;Patient requests pain meds  GP     Shiquita Collignon 02/18/2012, 3:48 PM  Pager [phone]

## 2012-02-19 ENCOUNTER — Inpatient Hospital Stay (HOSPITAL_COMMUNITY): Payer: Medicare HMO

## 2012-02-19 DIAGNOSIS — D649 Anemia, unspecified: Secondary | ICD-10-CM | POA: Diagnosis present

## 2012-02-19 DIAGNOSIS — E876 Hypokalemia: Secondary | ICD-10-CM | POA: Diagnosis present

## 2012-02-19 DIAGNOSIS — G062 Extradural and subdural abscess, unspecified: Secondary | ICD-10-CM

## 2012-02-19 DIAGNOSIS — R739 Hyperglycemia, unspecified: Secondary | ICD-10-CM | POA: Diagnosis present

## 2012-02-19 DIAGNOSIS — G8250 Quadriplegia, unspecified: Secondary | ICD-10-CM

## 2012-02-19 DIAGNOSIS — R1310 Dysphagia, unspecified: Secondary | ICD-10-CM | POA: Diagnosis present

## 2012-02-19 DIAGNOSIS — A419 Sepsis, unspecified organism: Secondary | ICD-10-CM | POA: Insufficient documentation

## 2012-02-19 DIAGNOSIS — M4620 Osteomyelitis of vertebra, site unspecified: Secondary | ICD-10-CM | POA: Diagnosis present

## 2012-02-19 LAB — CBC
MCV: 88.5 fL (ref 78.0–100.0)
Platelets: 602 10*3/uL — ABNORMAL HIGH (ref 150–400)
RBC: 3.14 MIL/uL — ABNORMAL LOW (ref 4.22–5.81)
WBC: 16.9 10*3/uL — ABNORMAL HIGH (ref 4.0–10.5)

## 2012-02-19 LAB — BASIC METABOLIC PANEL
CO2: 22 mEq/L (ref 19–32)
Calcium: 7.8 mg/dL — ABNORMAL LOW (ref 8.4–10.5)
GFR calc non Af Amer: >90 mL/min (ref >90)
Sodium: 138 mEq/L (ref 135–145)

## 2012-02-19 LAB — ANAEROBIC CULTURE

## 2012-02-19 LAB — HEMOGLOBIN A1C
Hgb A1c MFr Bld: 5.8 % — ABNORMAL HIGH (ref <5.7)
Mean Plasma Glucose: 120 mg/dL — ABNORMAL HIGH (ref <117)

## 2012-02-19 LAB — VANCOMYCIN, TROUGH: Vancomycin Tr: 17.7 ug/mL (ref 10.0–20.0)

## 2012-02-19 MED ORDER — POTASSIUM CHLORIDE CRYS ER 20 MEQ PO TBCR
40.0000 meq | EXTENDED_RELEASE_TABLET | ORAL | Status: AC
  Administered 2012-02-19: 40 meq via ORAL
  Filled 2012-02-19: qty 2

## 2012-02-19 MED ORDER — POTASSIUM CHLORIDE CRYS ER 20 MEQ PO TBCR
40.0000 meq | EXTENDED_RELEASE_TABLET | Freq: Once | ORAL | Status: AC
  Administered 2012-02-19: 40 meq via ORAL
  Filled 2012-02-19: qty 2

## 2012-02-19 MED ORDER — PRO-STAT SUGAR FREE PO LIQD
30.0000 mL | Freq: Two times a day (BID) | ORAL | Status: DC
  Administered 2012-02-19 – 2012-02-24 (×8): 30 mL via ORAL
  Filled 2012-02-19 (×13): qty 30

## 2012-02-19 MED ORDER — ENSURE COMPLETE PO LIQD
237.0000 mL | Freq: Two times a day (BID) | ORAL | Status: DC
  Administered 2012-02-19 – 2012-02-23 (×8): 237 mL via ORAL

## 2012-02-19 NOTE — Progress Notes (Signed)
PT Cancellation Note  Patient Details Name: Frank Brock MRN: [id number] DOB: 06-19-1960   Cancelled Treatment:    Reason Eval/Treat Not Completed: Other (comment) (RN calling report for pt to transfer to 3300).  Will get back as able today or see in a.m. 02/19/2012  [place] Frank Brock, PT [phone] (pager)    Frank Brock, Eliseo Gum 02/19/2012, 5:19 PM

## 2012-02-19 NOTE — Progress Notes (Signed)
Orthopedic Tech Progress Note Patient Details:  Frank Brock [birth year] [id number] Soft collar applied to patient with no complications. Patient stated collar was a comfortable fit.  Ortho Devices Type of Ortho Device: Soft collar Ortho Device/Splint Interventions: Application   [name] 02/19/2012, 1:14 PM

## 2012-02-19 NOTE — Progress Notes (Signed)
Marked improvement in motor function since surgery.  Now better than antigravity strength in both upper extremities and lower extremities.  Excellent Rehab candidate.

## 2012-02-19 NOTE — Progress Notes (Signed)
eLink Physician-Brief Progress Note Patient Name: Frank Brock DOB: April 04, 1960 MRN: [id number]  Date of Service  02/19/2012   HPI/Events of Note   Hypokalemia  eICU Interventions  Potassium replaced   Intervention Category Intermediate Interventions: Electrolyte abnormality - evaluation and management  [name] 02/19/2012, 5:56 AM

## 2012-02-19 NOTE — Progress Notes (Signed)
Hopefully, will continue to improve quadriparesis.  Already getting stronger with improving motor function in all 4 limbs.

## 2012-02-19 NOTE — Consult Note (Signed)
Physical Medicine and Rehabilitation Consult Reason for Consult: Quadriplegia due to spinal abscess/MRSA sepsis. Referring Physician:  Dr. Sherene Sires.    HPI: Frank Brock is a 52 y.o. male With hx of prior CVA, HTN hyperlipidemia, smoking, heavy alcohol use who developed small lesion on middle finger of L hand around 1/03. Developed severe upper L chest pain 1/08 and seen by primary MD 1/09. CXR normal @ that time. 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 Seen by Onc 1/15 and plans for eval of presumed lung cancer. Admitted via ED 1/17 with severe chest pain, hypoxemia, presumed PNA and continued presumption of LUL malignancy. Blood cultures drawn, abx started. Blood cx's turned positive on 1/18 ultimately growing MRSA in 2/2. In the interim he had deteriorated and CT showed suspected to was infact a large left-sided chest wall abscess communicating with the adjacent intrathoracic/lung abscess. He was transported emergently from [facility] to [facility] for emergent cardiothoracic surgery and underwent exploration of chest wound, minithoracotomy evacuation of chest wall abscess and intrapleural abscess with placement of chest tube and a wound vacuum dressing on 01/19 by Dr. Tyrone Sage.    ID consulted and recommended TEE for full workup. TEE revealed EF 60-65% and no thrombus. On 01/22 LUE doppler done revealing DVT in subclavian and axillary vein and patient started on heparin for treatment. On 01/23 patient was noted to have to be quadriparetic and Dr. Amada Jupiter consulted for input. MRI brain and spine done revealing remote infarcts and diffuse infection surrounding cervical and upper thoracic spine involving vertebra, soft tissue and with extension into epidural space causing cord compression C2-T1 and  Question subtle enhancement of nerve roots in lumbar spine. Dr. Venetia Maxon consulted for input and patient underwent posterior  cervical laminectomy for evacuation of abscess on the same day. White count improving. vean wean initiated and patient tolerated extubation on 02/17/12. ID expressing concerns that patient with continued positive cultures from neck and final recommendations on treatment pending.PT/OT evaluations done yesterday and patient able to sit at EOB for 10 mins with max assist and frequent rest breaks. CT removed today. MD, therapy team, CM recommending CIR.    Review of Systems  HENT: Negative for hearing loss and neck pain.   Eyes: Negative for blurred vision and double vision.  Respiratory: Negative for cough, sputum production and shortness of breath.   Cardiovascular: Negative for chest pain and palpitations.  Gastrointestinal: Positive for heartburn. Negative for nausea, vomiting and abdominal pain.  Musculoskeletal: Negative for myalgias and back pain.  Neurological: Positive for weakness. Negative for dizziness and headaches.    Past Medical History  Diagnosis Date  . Hypercholesteremia 02/06/2012  . Stroke 02/06/2012    09/13 secondary to hypertensive crisis.   Past Surgical History  Procedure Date  . Inguinal hernia repair   . Irrigation and debridement abscess 02/10/2012    Procedure: IRRIGATION AND DEBRIDEMENT ABSCESS;  Surgeon: Delight Ovens, MD;  Location: [facility] OR;  Service: Thoracic;  Laterality: Left;  . Thoracotomy 02/10/2012    Procedure: MINI/LIMITED THORACOTOMY;  Surgeon: Delight Ovens, MD;  Location: [facility] OR;  Service: Thoracic;  Laterality: Left;  . Chest exploration 02/11/2012    Procedure: CHEST EXPLORATION;  Surgeon: Delight Ovens, MD;  Location: [facility] OR;  Service: Thoracic;  Laterality: Left;  re-exploration of left chest wall; with wound vac change  . Minor application of wound vac 02/11/2012    Procedure: MINOR  APPLICATION OF WOUND VAC;  Surgeon: Delight Ovens, MD;  Location: [facility] OR;  Service: Thoracic;  Laterality: Left;  wound vac change  . Posterior cervical  laminectomy for epidural abscess 02/14/2012    Procedure: POSTERIOR CERVICAL LAMINECTOMY FOR EPIDURAL ABSCESS;  Surgeon: Maeola Harman, MD;  Location: [facility] OR[facility];  Service: Neurosurgery;  Laterality: N/A;  Cervical Laminectomy  for Epidural Abscess   Family History  Problem Relation Age of Onset  . Hypothyroidism Mother    Social History:  Lives with mother ( in 16's and works part time) and brother. He reports that he has been smoking-1/2 PPD.  He has never used smokeless tobacco. He reports that he drinks about 2-4 beers and 2 mixed drinks daily. Marland Kitchen He reports that he does not use illicit drugs. 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   Allergies: No Known Allergies  Medications Prior to Admission  Medication Sig Dispense Refill  . aspirin 81 MG tablet Take 81 mg by mouth daily.      Marland Kitchen lisinopril (PRINIVIL,ZESTRIL) 20 MG tablet Take 20 mg by mouth daily before lunch.       . morphine (MS CONTIN) 30 MG 12 hr tablet Take 1 tablet (30 mg total) by mouth 2 (two) times daily.  60 tablet  0  . Multiple Vitamin (MULTIVITAMIN) tablet Take 1 tablet by mouth daily.      Marland Kitchen oxyCODONE-acetaminophen (PERCOCET/ROXICET) 5-325 MG per tablet Take 1 tablet by mouth 2 (two) times daily.       . polyethylene glycol (MIRALAX / GLYCOLAX) packet Take 17 g by mouth daily.      . simvastatin (ZOCOR) 40 MG tablet Take 40 mg by mouth every evening.        Home: Home Living Lives With: Family Available Help at Discharge: Family (?wife or mother--pt unclear) Type of Home: House Home Access: Stairs to enter Secretary/administrator of Steps: 2 Home Layout: One level Bathroom Shower/Tub: Associate Professor:  (unsure) Home Adaptive Equipment: None Additional Comments: pt reports family is in [employer] representative business and can make home modifications he will need  Functional History: Prior Function Able to Take Stairs?: Reciprically Driving:  Yes Vocation: Unemployed Comments: laid off in [place] after his stroke Functional Status:  Mobility: Bed Mobility Bed Mobility: Rolling Right;Rolling Left;Supine to Sit;Sitting - Scoot to [place] of Bed;Sit to Sidelying Left;Scooting to [facility] Rolling Right: 1: +2 Total assist Rolling Right: Patient Percentage: 10% Rolling Left: 1: +2 Total assist Rolling Left: Patient Percentage: 10% Supine to Sit: 1: +2 Total assist;HOB elevated (HOB 45) Supine to Sit: Patient Percentage: 10% Sitting - Scoot to Edge of Bed: 1: +2 Total assist Sitting - Scoot to Edge of Bed: Patient Percentage: 0% Sit to Sidelying Left: 1: +2 Total assist;HOB flat Sit to Sidelying Left: Patient Percentage: 10% Scooting to HOB: 1: +2 Total assist Scooting to [facility]: Patient Percentage: 0% Transfers Transfers: Not assessed Ambulation/Gait Ambulation/Gait Assistance: Not tested (comment)    ADL: ADL Eating/Feeding: +1 Total assistance Where Assessed - Eating/Feeding: Bed level Grooming: +1 Total assistance Where Assessed - Grooming: Supported sitting Upper Body Bathing: +1 Total assistance Where Assessed - Upper Body Bathing: Supported sitting Lower Body Bathing: +2 Total assistance Where Assessed - Lower Body Bathing: Supine, head of bed up;Rolling right and/or left Upper Body Dressing: +1 Total assistance Where Assessed - Upper Body Dressing: Supported sitting Lower Body Dressing: +2 Total assistance Where Assessed - Lower Body  Dressing: Supine, head of bed up;Rolling right and/or left Toilet Transfer:  (not assessed) Transfers/Ambulation Related to ADLs: not assessed at this time. Not medically ready. ADL Comments: total A at this time  Cognition: Cognition Arousal/Alertness: Awake/alert Orientation Level: Oriented X4 Cognition Overall Cognitive Status: Impaired Area of Impairment: Attention;Other (comment);Awareness of deficits (internally distracted - ?due to meds/ ICU/ situational  stres) Arousal/Alertness: Awake/alert Orientation Level: Appears intact for tasks assessed Behavior During Session: [place] for tasks performed Current Attention Level: Selective Attention - Other Comments: cues to maintain on topic Awareness of Deficits: asked if he would going to outpt therapy next week  Blood pressure 140/80, pulse 76, temperature 97.7 F (36.5 C), temperature source Oral, resp. rate 23, height 5\' 10"  (1.778 m), weight 82.3 kg (181 lb 7 oz), SpO2 95.00%.  Physical Exam  Nursing note and vitals reviewed. Constitutional: He is oriented to person, place, and time. He appears well-developed and well-nourished.  HENT:  Head: Normocephalic and atraumatic.  Eyes: Pupils are equal, round, and reactive to light.  Cardiovascular: Normal rate and regular rhythm.   Pulmonary/Chest: Effort normal. No respiratory distress. He has rales in the right upper field and the right middle field. He exhibits no tenderness.  Abdominal: Soft. Bowel sounds are normal.  Musculoskeletal: He exhibits no edema and no tenderness.  Neurological: He is alert and oriented to person, place, and time. He displays abnormal reflex. Coordination abnormal.       Diminished LT below shoulders. RUE 2- to 2/5. LUE is 1+ to 2-. Lower ext's 2+ except ADF. No resting tone. dtr's slightly increased. Fair insight. Slow to process and react. Weakphonation. Is able to swallow.  Skin: Skin is warm and dry.       Chest wounds noted.   Psychiatric: He has a normal mood and affect. His behavior is normal.    Results for orders placed during the hospital encounter of 02/08/12 (from the past 24 hour(s))  GLUCOSE, CAPILLARY     Status: Abnormal   Collection Time   02/18/12  8:05 AM      Component Value Range   Glucose-Capillary 106 (*) 70 - 99 mg/dL  GLUCOSE, CAPILLARY     Status: Abnormal   Collection Time   02/18/12 11:50 AM      Component Value Range   Glucose-Capillary 111 (*) 70 - 99 mg/dL  BASIC METABOLIC PANEL      Status: Abnormal   Collection Time   02/19/12  4:45 AM      Component Value Range   Sodium 138  135 - 145 mEq/L   Potassium 2.9 (*) 3.5 - 5.1 mEq/L   Chloride 105  96 - 112 mEq/L   CO2 22  19 - 32 mEq/L   Glucose, Bld 114 (*) 70 - 99 mg/dL   BUN 18  6 - 23 mg/dL   Creatinine, Ser 1.61 (*) 0.50 - 1.35 mg/dL   Calcium 7.8 (*) 8.4 - 10.5 mg/dL   GFR calc non Af Amer >90  >90 mL/min   GFR calc Af Amer >90  >90 mL/min  CBC     Status: Abnormal   Collection Time   02/19/12  4:45 AM      Component Value Range   WBC 16.9 (*) 4.0 - 10.5 K/uL   RBC 3.14 (*) 4.22 - 5.81 MIL/uL   Hemoglobin 9.4 (*) 13.0 - 17.0 g/dL   HCT 09.6 (*) 04.5 - 40.9 %   MCV 88.5  78.0 - 100.0 fL  MCH 29.9  26.0 - 34.0 pg   MCHC 33.8  30.0 - 36.0 g/dL   RDW 16.1  09.6 - 04.5 %   Platelets 602 (*) 150 - 400 K/uL   Dg Chest Port 1 View  02/18/2012  *RADIOLOGY REPORT*  Clinical Data: Extubated.  Follow-up chest tube.  PORTABLE CHEST - 1 VIEW  Comparison: 02/17/2012  Findings: Endotracheal tube has been removed.  Left internal jugular central line has its tip in the SVC at the azygos level. Left-sided chest tube remains in place.  No pneumothorax.  Patchy pulmonary density persists particularly in the left upper lobe, left lower lobe and right lower lobe.  This appears similar or slightly more prominent.  No qualitatively new finding.  IMPRESSION: Endotracheal tube removed.  Left chest tube remains in place without pneumothorax.  Patchy pulmonary density, possibly slightly increased since yesterday's exam.   Original Report Authenticated By: Paulina Fusi, M.D.     Assessment/Plan: Diagnosis: chest wall, lung abscesses. parapsinal abscesses with cord compression and tetraplegia 1. Does the need for close, 24 hr/day medical supervision in concert with the patient's rehab needs make it unreasonable for this patient to be served in a less intensive setting? Yes 2. Co-Morbidities requiring supervision/potential complications:  anemia, wound care, sepsi 3. Due to bladder management, bowel management, safety, skin/wound care, disease management, medication administration, pain management and patient education, does the patient require 24 hr/day rehab nursing? Yes 4. Does the patient require coordinated care of a physician, rehab nurse, PT (1-2 hrs/day, 5 days/week), OT (1-2 hrs/day, 5 days/week) and SLP (1-2 hrs/day, 5 days/week) to address physical and functional deficits in the context of the above medical diagnosis(es)? Yes Addressing deficits in the following areas: balance, endurance, locomotion, strength, transferring, bowel/bladder control, bathing, dressing, feeding, grooming, toileting, speech and psychosocial support 5. Can the patient actively participate in an intensive therapy program of at least 3 hrs of therapy per day at least 5 days per week? Potentially 6. The potential for patient to make measurable gains while on inpatient rehab is good 7. Anticipated functional outcomes upon discharge from inpatient rehab are mod assist with PT, mod to max assist with OT, supervision to min assist with SLP. 8. Estimated rehab length of stay to reach the above functional goals is: 3-4 weeks 9. Does the patient have adequate social supports to accommodate these discharge functional goals? Potentially 10. Anticipated D/C setting: Home 11. Anticipated post D/C treatments: HH therapy to outpt therapy 12. Overall Rehab/Functional Prognosis: excellent  RECOMMENDATIONS: This patient's condition is appropriate for continued rehabilitative care in the following setting: CIR Patient has agreed to participate in recommended program. Yes Note that insurance prior authorization may be required for reimbursement for recommended care.  Comment:Rehab RN to follow up.   Ivory Broad, MD     02/19/2012

## 2012-02-19 NOTE — Progress Notes (Signed)
Peripherally Inserted Central Catheter/Midline Placement  The IV Nurse has discussed with the patient and/or persons authorized to consent for the patient, the purpose of this procedure and the potential benefits and risks involved with this procedure.  The benefits include less needle sticks, lab draws from the catheter and patient may be discharged home with the catheter.  Risks include, but not limited to, infection, bleeding, blood clot (thrombus formation), and puncture of an artery; nerve damage and irregular heat beat.  Alternatives to this procedure were also discussed.  PICC/Midline Placement Documentation        Stacie Glaze Horton 02/19/2012, 9:09 AM

## 2012-02-19 NOTE — Progress Notes (Signed)
ANTIBIOTIC CONSULT NOTE - FOLLOW UP  Pharmacy Consult:  Vancomycin Indication:  MRSA infections  No Known Allergies  Patient Measurements: Height: 5\' 10"  (177.8 cm) Weight: 181 lb 7 oz (82.3 kg) IBW/kg (Calculated) : 73   Vital Signs: BP: 160/75 mmHg (01/28 1100) Pulse Rate: 79  (01/28 1100) Intake/Output from previous day: 01/27 0701 - 01/28 0700 In: 1960 [P.O.:580; I.V.:480; IV Piggyback:900] Out: 2940 [Urine:2830; Drains:100; Chest Tube:10] Intake/Output from this shift: Total I/O In: 190 [P.O.:150; I.V.:40] Out: 475 [Urine:475]  Labs:  [facility] 02/19/12 0445 02/18/12 0400 02/17/12 0401  WBC 16.9* 18.8* 15.0*  HGB 9.4* 9.9* 7.0*  PLT 602* 582* 601*  LABCREA -- -- --  CREATININE 0.48* 0.50 0.65   Estimated Creatinine Clearance: 114.1 ml/min (by C-G formula based on Cr of 0.48).  Basename 02/19/12 1100 02/17/12 1830  VANCOTROUGH 17.7 --  VANCOPEAK -- --  Drue Dun -- --  GENTTROUGH -- 0.6  GENTPEAK -- --  GENTRANDOM -- --  TOBRATROUGH -- --  TOBRAPEAK -- --  TOBRARND -- --  AMIKACINPEAK -- --  AMIKACINTROU -- --  AMIKACIN -- --     Microbiology: Recent Results (from the past 720 hour(s))  TECHNOLOGIST REVIEW     Status: Normal   Collection Time   02/06/12  9:42 AM      Component Value Range Status Comment   Technologist Review Occ Metas and Myelocytes present   Final   CULTURE, BLOOD (ROUTINE X 2)     Status: Normal   Collection Time   02/08/12  3:39 PM      Component Value Range Status Comment   Specimen Description BLOOD RIGHT ANTECUBITAL   Final    Special Requests BOTTLES DRAWN AEROBIC AND ANAEROBIC 5CC   Final    Culture  Setup Time 02/08/2012 23:18   Final    Culture     Final    Value: METHICILLIN RESISTANT STAPHYLOCOCCUS AUREUS     Note: RIFAMPIN AND GENTAMICIN SHOULD NOT BE USED AS SINGLE DRUGS FOR TREATMENT OF STAPH INFECTIONS. This organism DOES NOT demonstrate inducible Clindamycin resistance in vitro. CRITICAL RESULT CALLED TO, READ BACK  BY AND VERIFIED WITH: MANDY ROLLS      02/10/12 @ 9:03PM BY RUSCA.     Note: Gram Stain Report Called to,Read Back By and Verified With: MEELY RICHARDSON 02/09/12 @ 2:26PM BY RUSCA.   Report Status 02/11/2012 FINAL   Final    Organism ID, Bacteria METHICILLIN RESISTANT STAPHYLOCOCCUS AUREUS   Final   CULTURE, BLOOD (ROUTINE X 2)     Status: Normal   Collection Time   02/08/12  3:49 PM      Component Value Range Status Comment   Specimen Description BLOOD LEFT HAND   Final    Special Requests BOTTLES DRAWN AEROBIC ONLY 2CC   Final    Culture  Setup Time 02/08/2012 23:19   Final    Culture     Final    Value: STAPHYLOCOCCUS AUREUS     Note: SUSCEPTIBILITIES PERFORMED ON PREVIOUS CULTURE WITHIN THE LAST 5 DAYS. CRITICAL RESULT CALLED TO, READ BACK BY AND VERIFIED WITH: MANDY ROLLS 02/10/12 @ 9:03PM BY RUSCA.     Note: Gram Stain Report Called to,Read Back By and Verified With: MEELY RICHARDSON 02/09/12 @ 2:26PM BY RUSCA.   Report Status 02/11/2012 FINAL   Final   URINE CULTURE     Status: Normal   Collection Time   02/08/12  4:58 PM      Component Value  Range Status Comment   Specimen Description URINE, CATHETERIZED   Final    Special Requests NONE   Final    Culture  Setup Time 02/09/2012 01:48   Final    Colony Count NO GROWTH   Final    Culture NO GROWTH   Final    Report Status 02/10/2012 FINAL   Final   CULTURE, EXPECTORATED SPUTUM-ASSESSMENT     Status: Normal   Collection Time   02/08/12  5:54 PM      Component Value Range Status Comment   Specimen Description SPUTUM   Final    Special Requests NONE   Final    Sputum evaluation     Final    Value: THIS SPECIMEN IS ACCEPTABLE. RESPIRATORY CULTURE REPORT TO FOLLOW.   Report Status 02/08/2012 FINAL   Final   MRSA PCR SCREENING     Status: Abnormal   Collection Time   02/08/12  5:54 PM      Component Value Range Status Comment   MRSA by PCR POSITIVE (*) NEGATIVE Final   CULTURE, RESPIRATORY     Status: Normal   Collection Time    02/08/12  5:54 PM      Component Value Range Status Comment   Specimen Description SPUTUM   Final    Special Requests NONE   Final    Gram Stain     Final    Value: ABUNDANT WBC PRESENT,BOTH PMN AND MONONUCLEAR     RARE SQUAMOUS EPITHELIAL CELLS PRESENT     ABUNDANT GRAM POSITIVE COCCI     IN PAIRS IN CLUSTERS   Culture     Final    Value: ABUNDANT METHICILLIN RESISTANT STAPHYLOCOCCUS AUREUS     Note: RIFAMPIN AND GENTAMICIN SHOULD NOT BE USED AS SINGLE DRUGS FOR TREATMENT OF STAPH INFECTIONS. This organism DOES NOT demonstrate inducible Clindamycin resistance in vitro.   Report Status 02/12/2012 FINAL   Final    Organism ID, Bacteria METHICILLIN RESISTANT STAPHYLOCOCCUS AUREUS   Final   CULTURE, BLOOD (ROUTINE X 2)     Status: Normal   Collection Time   02/09/12  7:50 PM      Component Value Range Status Comment   Specimen Description Blood   Final    Special Requests NONE   Final    Culture  Setup Time 02/10/2012 02:13   Final    Culture NO GROWTH 5 DAYS   Final    Report Status 02/16/2012 FINAL   Final   CULTURE, BLOOD (ROUTINE X 2)     Status: Normal   Collection Time   02/09/12  7:55 PM      Component Value Range Status Comment   Specimen Description Blood   Final    Special Requests NONE   Final    Culture  Setup Time 02/10/2012 02:13   Final    Culture NO GROWTH 5 DAYS   Final    Report Status 02/16/2012 FINAL   Final   ANAEROBIC CULTURE     Status: Normal   Collection Time   02/10/12  4:32 PM      Component Value Range Status Comment   Specimen Description ABSCESS LEFT CHEST   Final    Special Requests PATIENT ON FOLLOWING ANCEF VANC   Final    Gram Stain     Final    Value: ABUNDANT WBC PRESENT, PREDOMINANTLY PMN     NO SQUAMOUS EPITHELIAL CELLS SEEN     ABUNDANT GRAM POSITIVE COCCI  IN CLUSTERS   Culture NO ANAEROBES ISOLATED   Final    Report Status 02/15/2012 FINAL   Final   GRAM STAIN     Status: Normal   Collection Time   02/10/12  4:32 PM      Component  Value Range Status Comment   Specimen Description ABSCESS LEFT CHEST   Final    Special Requests PATIENT ON FOLLOWING ANCEF VANC   Final    Gram Stain     Final    Value: ABUNDANT WBC PRESENT, PREDOMINANTLY PMN     ABUNDANT GRAM POSITIVE COCCI IN CLUSTERS     Gram Stain Report Called to,Read Back By and Verified With: P.WEATHERLY,RN 02/10/12 1725 EHOWARD   Report Status 02/10/2012 FINAL   Final   CULTURE, ROUTINE-ABSCESS     Status: Normal   Collection Time   02/10/12  4:34 PM      Component Value Range Status Comment   Specimen Description ABSCESS LEFT CHEST   Final    Special Requests PATIENT ON FOLLOWING VANC ANCEF   Final    Gram Stain     Final    Value: ABUNDANT WBC PRESENT, PREDOMINANTLY PMN     NO SQUAMOUS EPITHELIAL CELLS SEEN     ABUNDANT GRAM POSITIVE COCCI IN CLUSTERS     Gram Stain Report Called to,Read Back By and Verified With: Gram Stain Report Called to,Read Back By and Verified With: P WEATHERLY RN 02/10/12 1725 BY ZOXWRUE Performed at [facility]   Culture     Final    Value: ABUNDANT METHICILLIN RESISTANT STAPHYLOCOCCUS AUREUS     Note: RIFAMPIN AND GENTAMICIN SHOULD NOT BE USED AS SINGLE DRUGS FOR TREATMENT OF STAPH INFECTIONS. This organism DOES NOT demonstrate inducible Clindamycin resistance in vitro. CRITICAL RESULT CALLED TO, READ BACK BY AND VERIFIED WITH: SUSAN F 1/22 @      820 BY REAMM   Report Status 02/13/2012 FINAL   Final    Organism ID, Bacteria METHICILLIN RESISTANT STAPHYLOCOCCUS AUREUS   Final   GRAM STAIN     Status: Normal   Collection Time   02/14/12  5:25 PM      Component Value Range Status Comment   Specimen Description ABSCESS NECK LEFT   Final    Special Requests PATIENT ON FOLLOWING VANCOMYCIN   Final    Gram Stain     Final    Value: RARE WBC PRESENT, PREDOMINANTLY PMN     NO ORGANISMS SEEN     Gram Stain Report Called to,Read Back By and Verified With: RN T. HARRELSON 02/14/12 1924 KERAN M.   Report Status 02/14/2012 FINAL   Final    ANAEROBIC CULTURE     Status: Normal   Collection Time   02/14/12  5:25 PM      Component Value Range Status Comment   Specimen Description ABSCESS NECK LEFT   Final    Special Requests PATIENT ON FOLLOWING VANCOMYCIN   Final    Gram Stain     Final    Value: RARE WBC PRESENT, PREDOMINANTLY PMN     NO ORGANISMS SEEN     Gram Stain Report Called to,Read Back By and Verified With: Gram Stain Report Called to,Read Back By and Verified With: RN T HARRELSON @18 :24 ON 02/14/12 BY Sallyanne Kuster M. Performed at [facility]   Culture NO ANAEROBES ISOLATED   Final    Report Status 02/19/2012 FINAL   Final   CULTURE, ROUTINE-ABSCESS  Status: Normal   Collection Time   02/14/12  5:25 PM      Component Value Range Status Comment   Specimen Description ABSCESS NECK LEFT   Final    Special Requests PATIENT ON FOLLOWING VANCOMYCIN   Final    Gram Stain     Final    Value: RARE WBC PRESENT, PREDOMINANTLY PMN     NO ORGANISMS SEEN     Gram Stain Report Called to,Read Back By and Verified With: Gram Stain Report Called to,Read Back By and Verified With: RN T. HARRELSON @19 :24 ON 02/14/12 BY Sallyanne Kuster M. Performed at [facility]   Culture     Final    Value: FEW METHICILLIN RESISTANT STAPHYLOCOCCUS AUREUS     Note: RIFAMPIN AND GENTAMICIN SHOULD NOT BE USED AS SINGLE DRUGS FOR TREATMENT OF STAPH INFECTIONS. This organism DOES NOT demonstrate inducible Clindamycin resistance in vitro.   Report Status 02/17/2012 FINAL   Final    Organism ID, Bacteria METHICILLIN RESISTANT STAPHYLOCOCCUS AUREUS   Final        Assessment: 5 YOM with recently discovered LUL/chest wall mass, admitted 02/08/12 with severe chest wall pain and hypoxemia, suspected to have PNA.  S/p I&D of chest wall abscess, drainage, and thoracotomy.  Blood, respiratory, and abscess cultures grew MRSA - repeat blood cultures negative.  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  His renal function has been stable and vancomycin  trough remains therapeutic.  Noted patient with acute quadriplegia due to spinal surgery.   MRSA PCR 1/17 >> POS Resp 1/17 >> MRSA  Blood 1/17 >> MRSA  1/18 bcx x2>> negative 1/19 L chest abscess>> abundant MRSA  1/23 L neck abscess>> few MRSA  1/21 VT = 13.5 (on 1gm q8h) 1/24 VT = 17.4 on 1250 q8h 1/27 VT = 17.7 on 1250mg  q8h  Tamiflu 1/17 >> 1/19 Zosyn 1/17 >> 1/20 Vanc 1/17 >> (plan d/c 2/6) Gent 1/24 >> 1/27 (trough was therapeutic at 0.6)   Goal of Therapy:  Vancomycin trough level 15-20 mcg/ml   Plan:  - Continue Vanc 1250mg  IV Q8H - Monitor renal fxn, clinical course, weekly vanc trough - F/U resume anticoagulation when able - F/U additional K+ supplementation     Asyria Kolander D. Laney Potash, PharmD, BCPS Pager:  [phone] 02/19/2012, 12:36 PM

## 2012-02-19 NOTE — Care Management Note (Signed)
    Page 1 of 1   02/19/2012     1:33:11 PM   CARE MANAGEMENT NOTE 02/19/2012  Patient:  Frank Brock, Frank Brock   Account Number:  [id number]  Date Initiated:  02/12/2012  Documentation initiated by:  [facility]  Subjective/Objective Assessment:   Admitted with hypoxia- CP on 1-17 - intubated.  02-11-11 post op mini thoracotomy and CT and VAC placement.     Action/Plan:   Anticipated DC Date:  02/19/2012   Anticipated DC Plan:  HOME W HOME HEALTH SERVICES      DC Planning Services  CM consult      Choice offered to / List presented to:             Status of service:  In process, will continue to follow Medicare Important Message given?   (If response is "NO", the following Medicare IM given date fields will be blank) Date Medicare IM given:   Date Additional Medicare IM given:    Discharge Disposition:    Per UR Regulation:  Reviewed for med. necessity/level of care/duration of stay  If discussed at Long Length of Stay Meetings, dates discussed:    Comments:  ContactDonnivan, Frank Brock Mother [phone]                 Frank Brock, Frank Brock [phone]  02-19-11 9:23am Frank Brock, RNBSN - [phone] 5 day post op lam for abscess and VAC to chest.  Ext on 1-26, plan for CIR vs SNF. Mom at bedside.  Patient states on discharge will go home to Mom's or wife's and will have 24/7 care, however do intend from him to go to CIR or SNF for rehab prior to going home.  CM will continue to follow for further dc needs.

## 2012-02-19 NOTE — Progress Notes (Signed)
Patient ID: Frank Brock, male   DOB: 08/10/1960, 52 y.o.   MRN: [id number]                   [address]            [place] [address]          [phone]     5 Days Post-Op Procedure(s) (LRB): POSTERIOR CERVICAL LAMINECTOMY FOR EPIDURAL ABSCESS (N/A)  LOS: 11 days   Subjective: Feels better   Objective: Vital signs in last 24 hours: Patient Vitals for the past 24 hrs:  BP Temp Temp src Pulse Resp SpO2 Weight  02/19/12 0700 140/80 mmHg - - 76  23  95 % -  02/19/12 0600 141/73 mmHg - - 77  23  93 % -  02/19/12 0500 149/81 mmHg - - 87  25  95 % 181 lb 7 oz (82.3 kg)  02/19/12 0400 159/88 mmHg - - 77  21  95 % -  02/19/12 0300 152/82 mmHg - - 84  24  94 % -  02/19/12 0200 158/82 mmHg - - 80  18  94 % -  02/19/12 0100 - - - 79  24  95 % -  02/19/12 0000 151/91 mmHg 97.7 F (36.5 C) Oral 80  24  99 % -  02/18/12 2300 146/80 mmHg - - 83  24  97 % -  02/18/12 2200 149/81 mmHg - - 87  28  96 % -  02/18/12 2100 146/77 mmHg - - 80  21  95 % -  02/18/12 2031 - - - 77  28  96 % -  02/18/12 2000 142/87 mmHg 97.9 F (36.6 C) Oral 76  27  96 % -  02/18/12 1900 140/79 mmHg - - 77  26  95 % -  02/18/12 1800 150/85 mmHg - - 82  20  96 % -  02/18/12 1700 147/88 mmHg - - 80  22  99 % -  02/18/12 1600 140/83 mmHg - - 82  25  99 % -  02/18/12 1547 - 97.7 F (36.5 C) Oral - - - -  02/18/12 1500 116/80 mmHg - - 85  23  93 % -  02/18/12 1422 150/89 mmHg - - - - - -  02/18/12 1421 135/78 mmHg - - 82  27  96 % -  02/18/12 1400 133/78 mmHg - - 79  26  96 % -  02/18/12 1300 131/77 mmHg - - 86  31  98 % -  02/18/12 1200 143/74 mmHg - - 81  27  93 % -  02/18/12 1152 - 98.3 F (36.8 C) Oral - - - -  02/18/12 1100 142/77 mmHg - - 85  28  93 % -  02/18/12 1000 135/68 mmHg - - 89  26  92 % -  02/18/12 0900 137/76 mmHg - - 83  22  95 % -  02/18/12 0816 - 98.1 F (36.7 C) Oral - - - -  02/18/12 0800 142/78 mmHg - - 82  27  95 % -    Filed Weights   02/16/12 0455 02/17/12 0400  02/19/12 0500  Weight: 191 lb 2.2 oz (86.7 kg) 186 lb 4.6 oz (84.5 kg) 181 lb 7 oz (82.3 kg)    Hemodynamic parameters for last 24 hours:    Intake/Output from previous day: 01/27 0701 - 01/28 0700 In: 1960 [P.O.:580; I.V.:480; IV Piggyback:900] Out: 2940 [Urine:2830; Drains:100;  Chest Tube:10] Intake/Output this shift:    Scheduled Meds:    . ipratropium  0.5 mg Nebulization Q6H   And  . albuterol  2.5 mg Nebulization Q6H  . antiseptic oral rinse  1 application Mouth Rinse QID  . bisacodyl  10 mg Oral Daily  . chlorhexidine  15 mL Mouth Rinse BID  . pantoprazole  40 mg Oral Q1200  . potassium chloride  40 mEq Oral Q1 Hr x 2  . sodium chloride  10-40 mL Intracatheter Q12H  . sodium chloride  3 mL Intravenous Q12H  . vancomycin  1,250 mg Intravenous Q8H   Continuous Infusions:    . sodium chloride    . sodium chloride 20 mL/hr at 02/18/12 0800  . sodium chloride 20 mL/hr at 02/19/12 0400   PRN Meds:.acetaminophen, albuterol, fentaNYL, hydrALAZINE, menthol-cetylpyridinium, metoprolol, ondansetron (ZOFRAN) IV, oxyCODONE, phenol, sodium chloride, sodium chloride, traZODone, white petrolatum  General appearance: alert, cooperative and no distress Wound: vac functioning, changed yesterday  Lab Results: CBC:  Basename 02/19/12 0445 02/18/12 0400  WBC 16.9* 18.8*  HGB 9.4* 9.9*  HCT 27.8* 28.8*  PLT 602* 582*   BMET:   Basename 02/19/12 0445 02/18/12 0400  NA 138 139  K 2.9* 3.2*  CL 105 104  CO2 22 24  GLUCOSE 114* 104*  BUN 18 21  CREATININE 0.48* 0.50  CALCIUM 7.8* 8.0*    PT/INR: No results found for this basename: LABPROT,INR in the last 72 hours   Radiology Dg Chest [facility] 1 View  02/18/2012  *RADIOLOGY REPORT*  Clinical Data: Extubated.  Follow-up chest tube.  PORTABLE CHEST - 1 VIEW  Comparison: 02/17/2012  Findings: Endotracheal tube has been removed.  Left internal jugular central line has its tip in the SVC at the azygos level. Left-sided chest tube  remains in place.  No pneumothorax.  Patchy pulmonary density persists particularly in the left upper lobe, left lower lobe and right lower lobe.  This appears similar or slightly more prominent.  No qualitatively new finding.  IMPRESSION: Endotracheal tube removed.  Left chest tube remains in place without pneumothorax.  Patchy pulmonary density, possibly slightly increased since yesterday's exam.   Original Report Authenticated By: Paulina Fusi, M.D.      Assessment/Plan: S/P Procedure(s) (LRB): POSTERIOR CERVICAL LAMINECTOMY FOR EPIDURAL ABSCESS (N/A) d/c tubes/lines- d/c left chest tube today,  Change vac tomorrow K being replaced for hypokalemia   Delight Ovens MD 02/19/2012 7:53 AM

## 2012-02-19 NOTE — Progress Notes (Signed)
NUTRITION FOLLOW UP  Intervention:    Ensure Complete twice daily (350 kcals, 13 gm protein per 8 fl oz bottle)  Prostat liquid protein 30 ml twice daily (100 kcals, 15 gm protein per dose) RD to follow for nutrition care plan  New Nutrition Dx:   Increased nutrient needs related to post-op/wound healing as evidenced by estimated nutrition needs  New Goal:   Oral intake with meals & supplements to meet >/= 90% of estimated nutrition needs  Monitor:   PO & supplemental intake, weight, labs, I/O's  Assessment:   Patient extubated 1/26.  EN discontinued with extubation.  Diet advanced to Full Liquids 1/27.  Sleeping soundly upon RD visitation.  Soft collar in place.  Would benefit from addition of nutrition supplements ---> RD to order.  Patient s/p procedure 1/23:  POSTERIOR CERVICAL LAMINECTOMY FOR EPIDURAL ABSCESS   Patient s/p procedures 1/19:  IRRIGATION AND DEBRIDEMENT ABSCESS (Left)  MINI/LIMITED THORACOTOMY (Left)  Height: Ht Readings from Last 1 Encounters:  02/10/12 5\' 10"  (1.778 m)    Weight Status:   Wt Readings from Last 1 Encounters:  02/19/12 181 lb 7 oz (82.3 kg)    Re-estimated needs:  Kcal: 2100-2300 Protein: 130-140 gm Fluid: 2.1-2.3 L  Skin: chest wound VAC  Diet Order: Full Liquid   Intake/Output Summary (Last 24 hours) at 02/19/12 1348 Last data filed at 02/19/12 1300  Gross per 24 hour  Intake   1480 ml  Output   2845 ml  Net  -1365 ml    Last BM: 1/27  Labs:   Lab 02/19/12 0445 02/18/12 0400 02/17/12 0401 02/16/12 0445 02/15/12 0450  NA 138 139 145 -- --  K 2.9* 3.2* 3.4* -- --  CL 105 104 112 -- --  CO2 22 24 29  -- --  BUN 18 21 22  -- --  CREATININE 0.48* 0.50 0.65 -- --  CALCIUM 7.8* 8.0* 7.8* -- --  MG -- -- -- 2.2 2.3  PHOS -- -- -- 3.2 3.0  GLUCOSE 114* 104* 126* -- --    CBG (last 3)   Basename 02/18/12 1150 02/18/12 0805 02/18/12 0403  GLUCAP 111* 106* 105*    Scheduled Meds:   . ipratropium  0.5 mg  Nebulization Q6H   And  . albuterol  2.5 mg Nebulization Q6H  . antiseptic oral rinse  1 application Mouth Rinse QID  . bisacodyl  10 mg Oral Daily  . chlorhexidine  15 mL Mouth Rinse BID  . pantoprazole  40 mg Oral Q1200  . sodium chloride  10-40 mL Intracatheter Q12H  . sodium chloride  3 mL Intravenous Q12H  . vancomycin  1,250 mg Intravenous Q8H    Continuous Infusions:   . sodium chloride    . sodium chloride 20 mL/hr at 02/18/12 0800  . sodium chloride 20 mL/hr at 02/19/12 0400    Maureen Chatters, RD, LDN Pager #: [phone] After-Hours Pager #: [phone]

## 2012-02-19 NOTE — Progress Notes (Addendum)
[facility] Progress Note Frank Brock TEAM 1 - Stepdown/ICU TEAM   Frank Brock ZOX:[id number] DOB: [birth year] DOA: 02/08/2012 PCP: [name], MD  Brief narrative: 52 year old male patient with tobacco abuse presented to a local urgent care with complaints of left shoulder pain at which time a CXR was unremarkable. Because of persistent back pain went to a chiropractor and repeat CXR ? Mass in upper lung. CT of the chest on 02/04/12 demonstrated left upper lobe soft tissue mass and ?? Metastatic lesions. This was associated with leukocytosis.He was sent out with pain medications. He was subsequently evaluated by hematology/oncology on 02/06/2012 (Dr. [name]) where plans were to proceed with a staging workup including a PET scan as well as MRI of the brain to be done as soon as possible; plans were also to refer the patient to interventional radiology for consideration of CT guided needle aspiration of the left upper lobe lung mass for tissue diagnosis.    Patient returned back to the emergency department on 02/08/2012 with hypoxemic respiratory failure. He was admitted by pulmonary critical care medicine. During the hospitalization his respiratory status worsened and followup x-ray revealed bilateral pulmonary infiltrates. CT of the chest also revealed a large left-sided chest wall abscess that communicated with an intrathoracic pulmonary abscess. Also interval development of rapidly progressive bilateral pulmonary infiltrates c/w infiltrates and septic emboli. Thoracic surgery consultation was obtained and patient was urgently transferred to [facility] for emergency surgery for chest wall abscess presumed staphylococcal etiology. He subsequently underwent incision and drainage of the chest wall abscess and mini-thoracotomy with placement of wound VAC and insertion of left chest tube on 02/10/2012.   Subsequent blood cultures were positive for methicillin-resistant Staphylococcus  aureus. Infectious disease service was consulted. Central line placed at time of admission was removed per ID recommendations. 2-D echocardiogram did not demonstrate evidence of vegetation. He underwent a TEE on 02/14/2012 that revealed no vegetations or atrial thrombus.  On 02/13/2012 patient was found to have a left upper extremity DVT involving the subclavian axillary vein and was started on heparin. This was later stopped after neurological exam revealed patient to be quadriplegic when sedation was weaned off. Neurology initially evaluated the patient. An MRI was ordered that revealed an epidural compressive lesion in the cervical spinal cord with edema. This prompted an urgent neurosurgical consultation. Patient subsequently underwent posterior cervical laminectomy for an epidural abscess at C3-3-C 7 on 02/14/2012. In the past 24 hours patient has regained very weak gross motor activity in the upper and lower extremities.  From a respiratory standpoint he was able to be extubated on 02/17/2012 and is stable on nasal cannula oxygen. He continues on vancomycin and gentamicin for widely disseminated MRSA infections as noted above. Plans are to removehis chest tubes soon. Wound VAC remains in place. PT and OT are continued to work with the patient and when medically stable neurosurgery recommends an inpatient rehabilitation.   Assessment/Plan: Principal Problem:  *Sepsis -Due to widely disseminated MRSA-hemodynamically stable  Active Problems:  Bacteremia due to methicillin resistant Staphylococcus aureus -ID following -Continue Vanco and gentamycin -TEE w/o vegetation/endocarditis   Quadriplegia/Paraspinal epidural abscess -NS following-etiology felt to be due to spinal cord infarction although intra-op cx's were positive for MRSA -PT/OT- soft C-collar -CIR recommended -Attempting to contact neurosurgery to decide if Ok to resume Heparin for DVT ??   Acute respiratory failure with  hypoxia: A) Chest wall abscess/Lung abscess--S/P VATS (1/19) -CVTS following -Possible dc CT in am -Cont. Supportive care  DVT of upper extremity (02/13/12) -Involves left subclavian and axillary veins -Anti-coagulation stopped 2/2 acute quadraplegia from epidural abscess and need for surgery -need clarification from NS if Ok to resume Heparin   Hypokalemia -Replete- follow lytes   Acute hyperglycemia -CBG much better and likely up 2/2 dextrose IVF -HgbA1c pending   Anemia of critical illness -Baseline hgb 13- current is 9.4 -Post 2 units PRBC's this admit   H/O hypercholesteremia   H/O stroke    DVT prophylaxis: SCDs Code Status: Full Family Communication: Patient Disposition Plan: Stepdown  Consultants: Cardiothoracic surgery Infectious disease Neurology Neurosurgery  Procedures: Irrigation and debridement of left chest wall abscess with minithoracotomy, placement of left chest tube and wound VAC (02/10/2012)  Reexploration of left chest debridement site with further wound debridement and placement of wound VAC (02/12/2012)  Posterior cervical laminectomy for epidural abscess C3-C7 (02/14/2012)  TEE (02/14/2012): Study Conclusions - Left ventricle: Systolic function was normal. The estimated ejection fraction was in the range of 60% to 65%. Wall motion was normal; there were no regional wall motion abnormalities. - Left atrium: No evidence of thrombus in the atrial cavity or appendage. Impressions: - No vegetations.  2-D echocardiogram: (02/11/12): Study Conclusions - Left ventricle: The cavity size was normal. Wall thickness was normal. The estimated ejection fraction was 55%. Images were inadequate for LV wall motion assessment. Features are consistent with a pseudonormal left ventricular filling pattern, with concomitant abnormal relaxation and increased filling pressure (grade 2 diastolic dysfunction). - Aortic valve: Poorly visualized. Probably  trileaflet. There was no stenosis. - Mitral valve: No significant regurgitation. - Left atrium: The atrium was mildly dilated. - Right ventricle: The cavity size was normal. Systolic function was normal. - Pulmonary arteries: No complete TR doppler jet so unable to estimate PA systolic pressure. - Inferior vena cava: The vessel was normal in size; the respirophasic diameter changes were in the normal range (= 50%); findings are consistent with normal central venous pressure. Impressions: - Technically difficult study with very poor acoustic windows.Normal LV size with EF 55%. Unable to comment on individual wall segments due to poor images. Normal RV size and systolic function. No significant valvular abnormalities noted.  Left upper extremity venous duplex (02/13/12): Findings consistent with deep vein thrombosis involving the left subclavian vein and left axillary vein.  Microbiology:  MRSA PCR 1/17 >> POSITIVE  Resp 1/17 >> MRSA  Urine 1/17 >> negative  Blood 1/17 >> 2/2 MRSA  Blood 1/18 >> neg  Abscess 1/19 >> MRSA  Antibiotics: Osletamivir 1/17 >> 1/19  Zosyn 1/17 >> 1/20  Vanc 1/17 >>    HPI/Subjective: Patient alert and encouraged by some recovery of neurological function. No other specific complaints verbalized   Objective: Blood pressure 160/75, pulse 79, temperature 97.7 F (36.5 C), temperature source Oral, resp. rate 24, height 5\' 10"  (1.778 m), weight 82.3 kg (181 lb 7 oz), SpO2 94.00%.  Intake/Output Summary (Last 24 hours) at 02/19/12 1254 Last data filed at 02/19/12 1100  Gross per 24 hour  Intake   1400 ml  Output   2765 ml  Net  -1365 ml     Exam: General: No acute respiratory distress Lungs: Coarse but clear to auscultation bilaterally without wheezes or crackles, room air; chest tube to Pleur-evac left lateral chest; wound VAC to left anterior chest Cardiovascular: Regular rate and rhythm without murmur gallop or rub normal S1 and S2, no JVD  and no peripheral edema Abdomen: Nontender, nondistended, soft, bowel sounds positive, no rebound,  no ascites, no appreciable mass Genitourinary: Foley catheter in place with light amber urine Musculoskeletal: No significant cyanosis, clubbing of bilateral lower extremities; offloading boots to bilateral lower extremities Neurological: Alert and oriented x3, has very limited gross motor movement of upper and lower extremities as follows: LUE 1+/5, RUE 1-/5, LLE 1/5, RLE 1/5  Data Reviewed: Basic Metabolic Panel:  Lab 02/19/12 1610 02/18/12 0400 02/17/12 0401 02/16/12 0445 02/15/12 0450  NA 138 139 145 147* 146*  K 2.9* 3.2* 3.4* 3.8 4.5  CL 105 104 112 111 111  CO2 22 24 29 29 29   GLUCOSE 114* 104* 126* 130* 134*  BUN 18 21 22 18 18   CREATININE 0.48* 0.50 0.65 0.65 0.68  CALCIUM 7.8* 8.0* 7.8* 8.0* 8.2*  MG -- -- -- 2.2 2.3  PHOS -- -- -- 3.2 3.0   Liver Function Tests:  Lab 02/18/12 0400 02/17/12 0401 02/15/12 0450 02/13/12 0422  AST 95* 72* 55* 93*  ALT 83* 59* 60* 67*  ALKPHOS 96 93 122* 143*  BILITOT 0.7 0.4 0.6 1.0  PROT 5.9* 5.6* 6.2 5.9*  ALBUMIN 1.5* 1.3* 1.4* 1.5*   No results found for this basename: LIPASE:5,AMYLASE:5 in the last 168 hours No results found for this basename: AMMONIA:5 in the last 168 hours CBC:  Lab 02/19/12 0445 02/18/12 0400 02/17/12 0401 02/16/12 0445 02/15/12 0450  WBC 16.9* 18.8* 15.0* 19.4* 21.8*  NEUTROABS -- -- -- -- --  HGB 9.4* 9.9* 7.0* 7.6* 7.9*  HCT 27.8* 28.8* 22.0* 24.4* 25.8*  MCV 88.5 89.7 94.0 95.7 95.2  PLT 602* 582* 601* 622* 574*   Cardiac Enzymes: No results found for this basename: CKTOTAL:5,CKMB:5,CKMBINDEX:5,TROPONINI:5 in the last 168 hours BNP (last 3 results)  Basename 02/10/12 0358  PROBNP 707.7*   CBG:  Lab 02/18/12 1150 02/18/12 0805 02/18/12 0403 02/18/12 0020 02/17/12 2007  GLUCAP 111* 106* 105* 96 96    Recent Results (from the past 240 hour(s))  CULTURE, BLOOD (ROUTINE X 2)     Status: Normal    Collection Time   02/09/12  7:50 PM      Component Value Range Status Comment   Specimen Description Blood   Final    Special Requests NONE   Final    Culture  Setup Time 02/10/2012 02:13   Final    Culture NO GROWTH 5 DAYS   Final    Report Status 02/16/2012 FINAL   Final   CULTURE, BLOOD (ROUTINE X 2)     Status: Normal   Collection Time   02/09/12  7:55 PM      Component Value Range Status Comment   Specimen Description Blood   Final    Special Requests NONE   Final    Culture  Setup Time 02/10/2012 02:13   Final    Culture NO GROWTH 5 DAYS   Final    Report Status 02/16/2012 FINAL   Final   ANAEROBIC CULTURE     Status: Normal   Collection Time   02/10/12  4:32 PM      Component Value Range Status Comment   Specimen Description ABSCESS LEFT CHEST   Final    Special Requests PATIENT ON FOLLOWING ANCEF VANC   Final    Gram Stain     Final    Value: ABUNDANT WBC PRESENT, PREDOMINANTLY PMN     NO SQUAMOUS EPITHELIAL CELLS SEEN     ABUNDANT GRAM POSITIVE COCCI     IN CLUSTERS   Culture NO ANAEROBES  ISOLATED   Final    Report Status 02/15/2012 FINAL   Final   GRAM STAIN     Status: Normal   Collection Time   02/10/12  4:32 PM      Component Value Range Status Comment   Specimen Description ABSCESS LEFT CHEST   Final    Special Requests PATIENT ON FOLLOWING ANCEF VANC   Final    Gram Stain     Final    Value: ABUNDANT WBC PRESENT, PREDOMINANTLY PMN     ABUNDANT GRAM POSITIVE COCCI IN CLUSTERS     Gram Stain Report Called to,Read Back By and Verified With: P.WEATHERLY,RN 02/10/12 1725 EHOWARD   Report Status 02/10/2012 FINAL   Final   CULTURE, ROUTINE-ABSCESS     Status: Normal   Collection Time   02/10/12  4:34 PM      Component Value Range Status Comment   Specimen Description ABSCESS LEFT CHEST   Final    Special Requests PATIENT ON FOLLOWING VANC ANCEF   Final    Gram Stain     Final    Value: ABUNDANT WBC PRESENT, PREDOMINANTLY PMN     NO SQUAMOUS EPITHELIAL CELLS SEEN      ABUNDANT GRAM POSITIVE COCCI IN CLUSTERS     Gram Stain Report Called to,Read Back By and Verified With: Gram Stain Report Called to,Read Back By and Verified With: P WEATHERLY RN 02/10/12 1725 BY ZOXWRUE Performed at [facility]   Culture     Final    Value: ABUNDANT METHICILLIN RESISTANT STAPHYLOCOCCUS AUREUS     Note: RIFAMPIN AND GENTAMICIN SHOULD NOT BE USED AS SINGLE DRUGS FOR TREATMENT OF STAPH INFECTIONS. This organism DOES NOT demonstrate inducible Clindamycin resistance in vitro. CRITICAL RESULT CALLED TO, READ BACK BY AND VERIFIED WITH: SUSAN F 1/22 @      820 BY REAMM   Report Status 02/13/2012 FINAL   Final    Organism ID, Bacteria METHICILLIN RESISTANT STAPHYLOCOCCUS AUREUS   Final   GRAM STAIN     Status: Normal   Collection Time   02/14/12  5:25 PM      Component Value Range Status Comment   Specimen Description ABSCESS NECK LEFT   Final    Special Requests PATIENT ON FOLLOWING VANCOMYCIN   Final    Gram Stain     Final    Value: RARE WBC PRESENT, PREDOMINANTLY PMN     NO ORGANISMS SEEN     Gram Stain Report Called to,Read Back By and Verified With: RN T. HARRELSON 02/14/12 1924 KERAN M.   Report Status 02/14/2012 FINAL   Final   ANAEROBIC CULTURE     Status: Normal   Collection Time   02/14/12  5:25 PM      Component Value Range Status Comment   Specimen Description ABSCESS NECK LEFT   Final    Special Requests PATIENT ON FOLLOWING VANCOMYCIN   Final    Gram Stain     Final    Value: RARE WBC PRESENT, PREDOMINANTLY PMN     NO ORGANISMS SEEN     Gram Stain Report Called to,Read Back By and Verified With: Gram Stain Report Called to,Read Back By and Verified With: RN T HARRELSON @18 :24 ON 02/14/12 BY Sallyanne Kuster M. Performed at [facility]   Culture NO ANAEROBES ISOLATED   Final    Report Status 02/19/2012 FINAL   Final   CULTURE, ROUTINE-ABSCESS     Status: Normal   Collection Time  02/14/12  5:25 PM      Component Value Range Status Comment   Specimen  Description ABSCESS NECK LEFT   Final    Special Requests PATIENT ON FOLLOWING VANCOMYCIN   Final    Gram Stain     Final    Value: RARE WBC PRESENT, PREDOMINANTLY PMN     NO ORGANISMS SEEN     Gram Stain Report Called to,Read Back By and Verified With: Gram Stain Report Called to,Read Back By and Verified With: RN T. HARRELSON @19 :24 ON 02/14/12 BY Sallyanne Kuster M. Performed at [facility]   Culture     Final    Value: FEW METHICILLIN RESISTANT STAPHYLOCOCCUS AUREUS     Note: RIFAMPIN AND GENTAMICIN SHOULD NOT BE USED AS SINGLE DRUGS FOR TREATMENT OF STAPH INFECTIONS. This organism DOES NOT demonstrate inducible Clindamycin resistance in vitro.   Report Status 02/17/2012 FINAL   Final    Organism ID, Bacteria METHICILLIN RESISTANT STAPHYLOCOCCUS AUREUS   Final      Studies:  Recent x-ray studies have been reviewed in detail by the Attending Physician  Scheduled Meds:  Reviewed in detail by the Attending Physician   Junious Silk, ANP [facility] Office  [phone] Pager [phone]  On-Call/Text Page:      [email or web address]      password [email or web address]  If 7PM-7AM, please contact night-coverage [email or web address] Password [facility] 02/19/2012, 12:54 PM   LOS: 11 days   I have examined the patient, reviewed the chart and modified the above note which I agree with.   RIZWAN,SAIMA,MD [phone] 02/19/2012, 3:42 PM

## 2012-02-19 NOTE — Progress Notes (Signed)
Pt transferred to [id number] per bed with belongings. Nurse at bedside. Pt tolerated transfer well.

## 2012-02-19 NOTE — Progress Notes (Signed)
INFECTIOUS DISEASE PROGRESS NOTE  ID: Frank Brock is a 52 y.o. male with  Principal Problem:  *Sepsis Active Problems:  H/O hypercholesteremia  H/O stroke  Acute respiratory failure with hypoxia  Chest wall abscess  Lung abscess  Bacteremia due to methicillin resistant Staphylococcus aureus  S/P VATS (1/19)  DVT of upper extremity (deep vein thrombosis)  Quadriplegia  Paraspinal epidural abscess  Hypokalemia  Anemia  Acute hyperglycemia  Subjective: Some neck pain, improved monvement  Abtx:  Anti-infectives     Start     Dose/Rate Route Frequency Ordered Stop   02/15/12 1130   gentamicin (GARAMYCIN) IVPB 100 mg  Status:  Discontinued        100 mg 200 mL/hr over 30 Minutes Intravenous Every 8 hours 02/15/12 1037 02/15/12 1042   02/15/12 1130   gentamicin (GARAMYCIN) IVPB 80 mg  Status:  Discontinued        80 mg 100 mL/hr over 30 Minutes Intravenous Every 8 hours 02/15/12 1042 02/18/12 0958   02/14/12 1654   bacitracin 50,000 Units in sodium chloride irrigation 0.9 % 500 mL irrigation  Status:  Discontinued          As needed 02/14/12 1706 02/14/12 1822   02/12/12 2000   vancomycin (VANCOCIN) 1,250 mg in sodium chloride 0.9 % 250 mL IVPB        1,250 mg 166.7 mL/hr over 90 Minutes Intravenous Every 8 hours 02/12/12 1333     02/11/12 1100   vancomycin (VANCOCIN) IVPB 1000 mg/200 mL premix  Status:  Discontinued        1,000 mg 200 mL/hr over 60 Minutes Intravenous Every 8 hours 02/11/12 1042 02/12/12 1332   02/11/12 0045   vancomycin (VANCOCIN) IVPB 1000 mg/200 mL premix  Status:  Discontinued     Comments: CONTINUE TILL FURTHER NOTICE      1,000 mg 200 mL/hr over 60 Minutes Intravenous Every 12 hours 02/10/12 1803 02/11/12 1041   02/10/12 2200   ceFAZolin (ANCEF) IVPB 2 g/50 mL premix  Status:  Discontinued        2 g 100 mL/hr over 30 Minutes Intravenous 3 times per day 02/10/12 1610 02/10/12 1755   02/10/12 2200   piperacillin-tazobactam (ZOSYN) IVPB  3.375 g  Status:  Discontinued        3.375 g 12.5 mL/hr over 240 Minutes Intravenous 3 times per day 02/10/12 1834 02/11/12 1130   02/10/12 1400   vancomycin (VANCOCIN) IVPB 1000 mg/200 mL premix  Status:  Discontinued        1,000 mg 200 mL/hr over 60 Minutes Intravenous Every 8 hours 02/10/12 1111 02/10/12 1755   02/10/12 1200   cefTRIAXone (ROCEPHIN) 1 g in dextrose 5 % 50 mL IVPB  Status:  Discontinued        1 g 100 mL/hr over 30 Minutes Intravenous Every 12 hours 02/10/12 1108 02/10/12 1610   02/09/12 1800   vancomycin (VANCOCIN) IVPB 1000 mg/200 mL premix  Status:  Discontinued        1,000 mg 200 mL/hr over 60 Minutes Intravenous Every 12 hours 02/09/12 1318 02/10/12 1111   02/09/12 0600   vancomycin (VANCOCIN) 1,250 mg in sodium chloride 0.9 % 250 mL IVPB  Status:  Discontinued        1,250 mg 166.7 mL/hr over 90 Minutes Intravenous Every 24 hours 02/08/12 1720 02/09/12 1318   02/08/12 2200   piperacillin-tazobactam (ZOSYN) IVPB 3.375 g  Status:  Discontinued  3.375 g 12.5 mL/hr over 240 Minutes Intravenous 3 times per day 02/08/12 1649 02/10/12 1108   02/08/12 2000   oseltamivir (TAMIFLU) capsule 75 mg  Status:  Discontinued        75 mg Oral 2 times daily 02/08/12 1720 02/10/12 1108   02/08/12 1700   vancomycin (VANCOCIN) IVPB 1000 mg/200 mL premix  Status:  Discontinued        1,000 mg 200 mL/hr over 60 Minutes Intravenous Every 12 hours 02/08/12 1649 02/08/12 1720   02/08/12 1530   vancomycin (VANCOCIN) IVPB 1000 mg/200 mL premix        1,000 mg 200 mL/hr over 60 Minutes Intravenous  Once 02/08/12 1527 02/08/12 2000   02/08/12 1530  piperacillin-tazobactam (ZOSYN) IVPB 3.375 g       3.375 g 12.5 mL/hr over 240 Minutes Intravenous  Once 02/08/12 1527 02/08/12 2004          Medications:  Scheduled:   . ipratropium  0.5 mg Nebulization Q6H   And  . albuterol  2.5 mg Nebulization Q6H  . antiseptic oral rinse  1 application Mouth Rinse QID  .  bisacodyl  10 mg Oral Daily  . chlorhexidine  15 mL Mouth Rinse BID  . pantoprazole  40 mg Oral Q1200  . sodium chloride  10-40 mL Intracatheter Q12H  . sodium chloride  3 mL Intravenous Q12H  . vancomycin  1,250 mg Intravenous Q8H    Objective: Vital signs in last 24 hours: Temp:  [97.7 F (36.5 C)-97.9 F (36.6 C)] 97.7 F (36.5 C) (01/28 0000) Pulse Rate:  [76-87] 79  (01/28 1100) Resp:  [18-31] 24  (01/28 1100) BP: (116-176)/(73-91) 160/75 mmHg (01/28 1100) SpO2:  [93 %-99 %] 94 % (01/28 1100) Weight:  [82.3 kg (181 lb 7 oz)] 82.3 kg (181 lb 7 oz) (01/28 0500)   General appearance: alert, cooperative and no distress Resp: clear to auscultation bilaterally Cardio: regular rate and rhythm GI: normal findings: bowel sounds normal and soft, non-tender Neurologic: Motor: improving movement of his LE> UE. he does have mild (3/5) strength in his fingers   Lab Results  Basename 02/19/12 0445 02/18/12 0400  WBC 16.9* 18.8*  HGB 9.4* 9.9*  HCT 27.8* 28.8*  NA 138 139  K 2.9* 3.2*  CL 105 104  CO2 22 24  BUN 18 21  CREATININE 0.48* 0.50  GLU -- --   Liver Panel  Basename 02/18/12 0400 02/17/12 0401  PROT 5.9* 5.6*  ALBUMIN 1.5* 1.3*  AST 95* 72*  ALT 83* 59*  ALKPHOS 96 93  BILITOT 0.7 0.4  BILIDIR -- --  IBILI -- --   Sedimentation Rate No results found for this basename: ESRSEDRATE in the last 72 hours C-Reactive Protein No results found for this basename: CRP:2 in the last 72 hours  Microbiology: Recent Results (from the past 240 hour(s))  CULTURE, BLOOD (ROUTINE X 2)     Status: Normal   Collection Time   02/09/12  7:50 PM      Component Value Range Status Comment   Specimen Description Blood   Final    Special Requests NONE   Final    Culture  Setup Time 02/10/2012 02:13   Final    Culture NO GROWTH 5 DAYS   Final    Report Status 02/16/2012 FINAL   Final   CULTURE, BLOOD (ROUTINE X 2)     Status: Normal   Collection Time   02/09/12  7:55 PM  Component Value Range Status Comment   Specimen Description Blood   Final    Special Requests NONE   Final    Culture  Setup Time 02/10/2012 02:13   Final    Culture NO GROWTH 5 DAYS   Final    Report Status 02/16/2012 FINAL   Final   ANAEROBIC CULTURE     Status: Normal   Collection Time   02/10/12  4:32 PM      Component Value Range Status Comment   Specimen Description ABSCESS LEFT CHEST   Final    Special Requests PATIENT ON FOLLOWING ANCEF VANC   Final    Gram Stain     Final    Value: ABUNDANT WBC PRESENT, PREDOMINANTLY PMN     NO SQUAMOUS EPITHELIAL CELLS SEEN     ABUNDANT GRAM POSITIVE COCCI     IN CLUSTERS   Culture NO ANAEROBES ISOLATED   Final    Report Status 02/15/2012 FINAL   Final   GRAM STAIN     Status: Normal   Collection Time   02/10/12  4:32 PM      Component Value Range Status Comment   Specimen Description ABSCESS LEFT CHEST   Final    Special Requests PATIENT ON FOLLOWING ANCEF VANC   Final    Gram Stain     Final    Value: ABUNDANT WBC PRESENT, PREDOMINANTLY PMN     ABUNDANT GRAM POSITIVE COCCI IN CLUSTERS     Gram Stain Report Called to,Read Back By and Verified With: P.WEATHERLY,RN 02/10/12 1725 EHOWARD   Report Status 02/10/2012 FINAL   Final   CULTURE, ROUTINE-ABSCESS     Status: Normal   Collection Time   02/10/12  4:34 PM      Component Value Range Status Comment   Specimen Description ABSCESS LEFT CHEST   Final    Special Requests PATIENT ON FOLLOWING VANC ANCEF   Final    Gram Stain     Final    Value: ABUNDANT WBC PRESENT, PREDOMINANTLY PMN     NO SQUAMOUS EPITHELIAL CELLS SEEN     ABUNDANT GRAM POSITIVE COCCI IN CLUSTERS     Gram Stain Report Called to,Read Back By and Verified With: Gram Stain Report Called to,Read Back By and Verified With: P WEATHERLY RN 02/10/12 1725 BY [id number] Performed at [facility]   Culture     Final    Value: ABUNDANT METHICILLIN RESISTANT STAPHYLOCOCCUS AUREUS     Note: RIFAMPIN AND GENTAMICIN SHOULD NOT BE  USED AS SINGLE DRUGS FOR TREATMENT OF STAPH INFECTIONS. This organism DOES NOT demonstrate inducible Clindamycin resistance in vitro. CRITICAL RESULT CALLED TO, READ BACK BY AND VERIFIED WITH: SUSAN F 1/22 @      820 BY REAMM   Report Status 02/13/2012 FINAL   Final    Organism ID, Bacteria METHICILLIN RESISTANT STAPHYLOCOCCUS AUREUS   Final   GRAM STAIN     Status: Normal   Collection Time   02/14/12  5:25 PM      Component Value Range Status Comment   Specimen Description ABSCESS NECK LEFT   Final    Special Requests PATIENT ON FOLLOWING VANCOMYCIN   Final    Gram Stain     Final    Value: RARE WBC PRESENT, PREDOMINANTLY PMN     NO ORGANISMS SEEN     Gram Stain Report Called to,Read Back By and Verified With: RN T. HARRELSON 02/14/12 1924 KERAN M.   Report Status  02/14/2012 FINAL   Final   ANAEROBIC CULTURE     Status: Normal   Collection Time   02/14/12  5:25 PM      Component Value Range Status Comment   Specimen Description ABSCESS NECK LEFT   Final    Special Requests PATIENT ON FOLLOWING VANCOMYCIN   Final    Gram Stain     Final    Value: RARE WBC PRESENT, PREDOMINANTLY PMN     NO ORGANISMS SEEN     Gram Stain Report Called to,Read Back By and Verified With: Gram Stain Report Called to,Read Back By and Verified With: RN T HARRELSON @18 :24 ON 02/14/12 BY Sallyanne Kuster M. Performed at [facility]   Culture NO ANAEROBES ISOLATED   Final    Report Status 02/19/2012 FINAL   Final   CULTURE, ROUTINE-ABSCESS     Status: Normal   Collection Time   02/14/12  5:25 PM      Component Value Range Status Comment   Specimen Description ABSCESS NECK LEFT   Final    Special Requests PATIENT ON FOLLOWING VANCOMYCIN   Final    Gram Stain     Final    Value: RARE WBC PRESENT, PREDOMINANTLY PMN     NO ORGANISMS SEEN     Gram Stain Report Called to,Read Back By and Verified With: Gram Stain Report Called to,Read Back By and Verified With: RN T. HARRELSON @19 :24 ON 02/14/12 BY Sallyanne Kuster M. Performed at  [facility]   Culture     Final    Value: FEW METHICILLIN RESISTANT STAPHYLOCOCCUS AUREUS     Note: RIFAMPIN AND GENTAMICIN SHOULD NOT BE USED AS SINGLE DRUGS FOR TREATMENT OF STAPH INFECTIONS. This organism DOES NOT demonstrate inducible Clindamycin resistance in vitro.   Report Status 02/17/2012 FINAL   Final    Organism ID, Bacteria METHICILLIN RESISTANT STAPHYLOCOCCUS AUREUS   Final     Studies/Results: Dg Chest Port 1 View  02/19/2012  *RADIOLOGY REPORT*  Clinical Data: To followup chest tube  PORTABLE CHEST - 1 VIEW  Comparison: 02/18/2012  Findings: Left chest tube and left central line remain in stable position.  No pneumothorax.  Patchy bilateral airspace opacities are again noted, slightly improved since prior study.  Heart is borderline in size.  Mild hyperinflation.  No visible effusions.  IMPRESSION: Slight improvement in patchy bilateral airspace opacities.  No pneumothorax.   Original Report Authenticated By: Charlett Nose, M.D.    Dg Chest Port 1 View  02/18/2012  *RADIOLOGY REPORT*  Clinical Data: Extubated.  Follow-up chest tube.  PORTABLE CHEST - 1 VIEW  Comparison: 02/17/2012  Findings: Endotracheal tube has been removed.  Left internal jugular central line has its tip in the SVC at the azygos level. Left-sided chest tube remains in place.  No pneumothorax.  Patchy pulmonary density persists particularly in the left upper lobe, left lower lobe and right lower lobe.  This appears similar or slightly more prominent.  No qualitatively new finding.  IMPRESSION: Endotracheal tube removed.  Left chest tube remains in place without pneumothorax.  Patchy pulmonary density, possibly slightly increased since yesterday's exam.   Original Report Authenticated By: Paulina Fusi, M.D.      Assessment/Plan: Epidural Abscess with paralysis (C2-C7)  Laminectomy/I & D 1-23  MRSA bacteremia, chest wall abscess   TEE (-)  Total days of antibiotics: 11   vanco   He is making progress.    Being eval by rehab Continue vancomycin, plan for 6 weeks  from 1-23        Johny Sax Infectious Diseases [phone] 02/19/2012, 11:52 AM   LOS: 11 days

## 2012-02-19 NOTE — Progress Notes (Signed)
Subjective: Patient reports "I have some soreness in my neck, but thats not the real pain"  Objective: Vital signs in last 24 hours: Temp:  [97.7 F (36.5 C)-98.3 F (36.8 C)] 97.7 F (36.5 C) (01/28 0000) Pulse Rate:  [76-89] 76  (01/28 0700) Resp:  [18-31] 23  (01/28 0700) BP: (116-159)/(68-91) 140/80 mmHg (01/28 0700) SpO2:  [92 %-99 %] 95 % (01/28 0700) Weight:  [82.3 kg (181 lb 7 oz)] 82.3 kg (181 lb 7 oz) (01/28 0500)  Intake/Output from previous day: 01/27 0701 - 01/28 0700 In: 1960 [P.O.:580; I.V.:480; IV Piggyback:900] Out: 2940 [Urine:2830; Drains:100; Chest Tube:10] Intake/Output this shift:    Alert, conversant. Reports rectal pain persists intermittently; with neck soreness primarily when working with PT/OT, sitting. Increasing strength BLE. Movement BUE, still unable to lift arms from pillows.   Lab Results:  Basename 02/19/12 0445 02/18/12 0400  WBC 16.9* 18.8*  HGB 9.4* 9.9*  HCT 27.8* 28.8*  PLT 602* 582*   BMET  Basename 02/19/12 0445 02/18/12 0400  NA 138 139  K 2.9* 3.2*  CL 105 104  CO2 22 24  GLUCOSE 114* 104*  BUN 18 21  CREATININE 0.48* 0.50  CALCIUM 7.8* 8.0*    Studies/Results: Dg Chest Port 1 View  02/18/2012  *RADIOLOGY REPORT*  Clinical Data: Extubated.  Follow-up chest tube.  PORTABLE CHEST - 1 VIEW  Comparison: 02/17/2012  Findings: Endotracheal tube has been removed.  Left internal jugular central line has its tip in the SVC at the azygos level. Left-sided chest tube remains in place.  No pneumothorax.  Patchy pulmonary density persists particularly in the left upper lobe, left lower lobe and right lower lobe.  This appears similar or slightly more prominent.  No qualitatively new finding.  IMPRESSION: Endotracheal tube removed.  Left chest tube remains in place without pneumothorax.  Patchy pulmonary density, possibly slightly increased since yesterday's exam.   Original Report Authenticated By: Paulina Fusi, M.D.      Assessment/Plan:   LOS: 11 days  Continue PT/OT, hopeful of CIR when appropriate. Currently awaiting stepdown bed. Ok for soft cervical collar for support/comfort when mobilizing/sitting. (Does not need to be worn at all times)   Georgiann Cocker 02/19/2012, 8:09 AM

## 2012-02-20 ENCOUNTER — Inpatient Hospital Stay (HOSPITAL_COMMUNITY): Payer: Medicare HMO

## 2012-02-20 LAB — CBC
Platelets: 608 10*3/uL — ABNORMAL HIGH (ref 150–400)
RDW: 14.1 % (ref 11.5–15.5)
WBC: 17.1 10*3/uL — ABNORMAL HIGH (ref 4.0–10.5)

## 2012-02-20 LAB — COMPREHENSIVE METABOLIC PANEL
AST: 75 U/L — ABNORMAL HIGH (ref 0–37)
Albumin: 1.7 g/dL — ABNORMAL LOW (ref 3.5–5.2)
Alkaline Phosphatase: 105 U/L (ref 39–117)
Chloride: 107 mEq/L (ref 96–112)
Potassium: 3.3 mEq/L — ABNORMAL LOW (ref 3.5–5.1)
Sodium: 138 mEq/L (ref 135–145)
Total Bilirubin: 0.6 mg/dL (ref 0.3–1.2)

## 2012-02-20 MED ORDER — ENOXAPARIN SODIUM 80 MG/0.8ML ~~LOC~~ SOLN
80.0000 mg | Freq: Two times a day (BID) | SUBCUTANEOUS | Status: DC
  Administered 2012-02-21 – 2012-02-25 (×8): 80 mg via SUBCUTANEOUS
  Filled 2012-02-20 (×12): qty 0.8

## 2012-02-20 MED ORDER — POTASSIUM CHLORIDE CRYS ER 20 MEQ PO TBCR
40.0000 meq | EXTENDED_RELEASE_TABLET | Freq: Once | ORAL | Status: AC
  Administered 2012-02-20: 40 meq via ORAL
  Filled 2012-02-20: qty 2

## 2012-02-20 MED ORDER — POTASSIUM CHLORIDE CRYS ER 20 MEQ PO TBCR
30.0000 meq | EXTENDED_RELEASE_TABLET | Freq: Once | ORAL | Status: AC
  Administered 2012-02-20: 30 meq via ORAL
  Filled 2012-02-20: qty 1

## 2012-02-20 MED ORDER — WHITE PETROLATUM GEL
Status: AC
  Filled 2012-02-20: qty 5

## 2012-02-20 MED ORDER — ENOXAPARIN SODIUM 80 MG/0.8ML ~~LOC~~ SOLN
80.0000 mg | SUBCUTANEOUS | Status: AC
  Administered 2012-02-20: 80 mg via SUBCUTANEOUS
  Filled 2012-02-20: qty 0.8

## 2012-02-20 MED ORDER — SODIUM CHLORIDE 0.9 % IJ SOLN
INTRAMUSCULAR | Status: AC
  Filled 2012-02-20: qty 30

## 2012-02-20 NOTE — Progress Notes (Signed)
Rehab admissions - Evaluated for possible admission.  I spoke briefly with patient during therapy session.  Also spoke with PA with internal medicine.  Patient not medically ready yet for inpatient rehab.  Also, not able to tolerate therapies yet.  Will follow progress and seek authorization for acute inpatient rehab admission once patient is more participatory and more medically ready.  Call me for questions.  #[phone]

## 2012-02-20 NOTE — Progress Notes (Addendum)
[facility] Progress Note [place] TEAM 1 - Stepdown/ICU TEAM   VAIBHAV FOGLEMAN EAV:[id number] DOB: 04-Mar-1960 DOA: 02/08/2012 PCP: [name], MD  Brief narrative: 52 year old male patient with tobacco abuse presented to a local urgent care with complaints of left shoulder pain at which time a CXR was unremarkable. Because of persistent back pain went to a chiropractor and repeat CXR ? Mass in upper lung. CT of the chest on 02/04/12 demonstrated left upper lobe soft tissue mass and ?? Metastatic lesions. This was associated with leukocytosis.He was sent out with pain medications. He was subsequently evaluated by hematology/oncology on 02/06/2012 (Dr. [name]) where plans were to proceed with a staging workup including a PET scan as well as MRI of the brain to be done as soon as possible; plans were also to refer the patient to interventional radiology for consideration of CT guided needle aspiration of the left upper lobe lung mass for tissue diagnosis.    Patient returned back to the emergency department on 02/08/2012 with hypoxemic respiratory failure. He was admitted by pulmonary critical care medicine. During the hospitalization his respiratory status worsened and followup x-ray revealed bilateral pulmonary infiltrates. CT of the chest also revealed a large left-sided chest wall abscess that communicated with an intrathoracic pulmonary abscess. Also interval development of rapidly progressive bilateral pulmonary infiltrates c/w infiltrates and septic emboli. Thoracic surgery consultation was obtained and patient was urgently transferred to [facility] for emergency surgery for chest wall abscess presumed staphylococcal etiology. He subsequently underwent incision and drainage of the chest wall abscess and mini-thoracotomy with placement of wound VAC and insertion of left chest tube on 02/10/2012.   Subsequent blood cultures were positive for methicillin-resistant Staphylococcus  aureus. Infectious disease service was consulted. Central line placed at time of admission was removed per ID recommendations. 2-D echocardiogram did not demonstrate evidence of vegetation. He underwent a TEE on 02/14/2012 that revealed no vegetations or atrial thrombus.  On 02/13/2012 patient was found to have a left upper extremity DVT involving the subclavian axillary vein and was started on heparin. This was later stopped after neurological exam revealed patient to be quadriplegic when sedation was weaned off. Neurology initially evaluated the patient. An MRI was ordered that revealed an epidural compressive lesion in the cervical spinal cord with edema. This prompted an urgent neurosurgical consultation. Patient subsequently underwent posterior cervical laminectomy for an epidural abscess at C3-3-C 7 on 02/14/2012. In the past 24 hours patient has regained very weak gross motor activity in the upper and lower extremities.  From a respiratory standpoint he was able to be extubated on 02/17/2012 and is stable on nasal cannula oxygen. He continues on vancomycin and gentamicin for widely disseminated MRSA infections as noted above. Plans are to removehis chest tubes soon. Wound VAC remains in place. PT and OT are continued to work with the patient and when medically stable neurosurgery recommends an inpatient rehabilitation.   Assessment/Plan:  Sepsis -Due to widely disseminated MRSA-hemodynamically stable  Bacteremia due to methicillin resistant Staphylococcus aureus -ID following -Continue Vanco and gentamycin -TEE w/o vegetation/endocarditis  Quadriplegia/Paraspinal epidural abscess -NS following-etiology felt to be due to spinal cord infarction although intra-op cx's were positive for MRSA -PT/OT- soft C-collar -CIR recommended -Very deconditioned and is max assist with PT and rapidly fatigued therefore dc to CIR should be delayed to ensure with future success with therapy  Acute  respiratory failure with hypoxia: A) Chest wall abscess/Lung abscess--S/P VATS (1/19) -CVTS following -CT's have been dc'd -Cont. Supportive  care  DVT of upper extremity (02/13/12) -Involves left subclavian and axillary veins -Anti-coagulation stopped 2/2 acute quadraplegia from epidural abscess and need for surgery -reinitiate anticoag as pt is now s/o ~6 days since his last operative procedure - will need to follow clinical progress very closely to assure no acute bleeding complications related to spinal surgery   Hypokalemia -Replete- follow lytes  Acute hyperglycemia -CBG much better and likely up 2/2 dextrose IVF -HgbA1c pending  Anemia of critical illness -Baseline hgb 13- current is 9.4 -Post 2 units PRBC's this admit  H/O hypercholesteremia  H/O stroke  DVT prophylaxis: lovenox Code Status: Full Family Communication: Patient Disposition Plan: Stepdown-once more medically stable plan dc to CIR  Consultants: Cardiothoracic surgery Infectious disease Neurology Neurosurgery  Procedures: Irrigation and debridement of left chest wall abscess with minithoracotomy, placement of left chest tube and wound VAC (02/10/2012)  Reexploration of left chest debridement site with further wound debridement and placement of wound VAC (02/12/2012)  Posterior cervical laminectomy for epidural abscess C3-C7 (02/14/2012)  TEE (02/14/2012): Study Conclusions - Left ventricle: Systolic function was normal. The estimated ejection fraction was in the range of 60% to 65%. Wall motion was normal; there were no regional wall motion abnormalities. - Left atrium: No evidence of thrombus in the atrial cavity or appendage. Impressions: - No vegetations.  2-D echocardiogram: (02/11/12): Study Conclusions - Left ventricle: The cavity size was normal. Wall thickness was normal. The estimated ejection fraction was 55%. Images were inadequate for LV wall motion assessment. Features are  consistent with a pseudonormal left ventricular filling pattern, with concomitant abnormal relaxation and increased filling pressure (grade 2 diastolic dysfunction). - Aortic valve: Poorly visualized. Probably trileaflet. There was no stenosis. - Mitral valve: No significant regurgitation. - Left atrium: The atrium was mildly dilated. - Right ventricle: The cavity size was normal. Systolic function was normal. - Pulmonary arteries: No complete TR doppler jet so unable to estimate PA systolic pressure. - Inferior vena cava: The vessel was normal in size; the respirophasic diameter changes were in the normal range (= 50%); findings are consistent with normal central venous pressure. Impressions: - Technically difficult study with very poor acoustic windows.Normal LV size with EF 55%. Unable to comment on individual wall segments due to poor images. Normal RV size and systolic function. No significant valvular abnormalities noted.  Left upper extremity venous duplex (02/13/12): Findings consistent with deep vein thrombosis involving the left subclavian vein and left axillary vein.  Microbiology:  MRSA PCR 1/17 >> POSITIVE  Resp 1/17 >> MRSA  Urine 1/17 >> negative  Blood 1/17 >> 2/2 MRSA  Blood 1/18 >> neg  Abscess 1/19 >> MRSA  Antibiotics: Osletamivir 1/17 >> 1/19  Zosyn 1/17 >> 1/20  Vanc 1/17 >>    HPI/Subjective: Patient alert and c/o rectal pain w/ defecation and endorses legs are numb.   Objective: Blood pressure 165/77, pulse 83, temperature 98.2 F (36.8 C), temperature source Oral, resp. rate 28, height 5\' 10"  (1.778 m), weight 81.7 kg (180 lb 1.9 oz), SpO2 97.00%.  Intake/Output Summary (Last 24 hours) at 02/20/12 1239 Last data filed at 02/20/12 1216  Gross per 24 hour  Intake   1224 ml  Output   1512 ml  Net   -288 ml     Exam: General: No acute respiratory distress Lungs: Coarse but clear to auscultation bilaterally without wheezes or  crackles Cardiovascular: Regular rate and rhythm without murmur gallop or rub normal S1 and S2, no JVD and no  peripheral edema Abdomen: Nontender, nondistended, soft, bowel sounds positive, no rebound, no ascites, no appreciable mass Genitourinary: Foley catheter in place with light amber urine Musculoskeletal: No significant cyanosis, clubbing of bilateral lower extremities; offloading boots to bilateral lower extremities Neurological: Alert and oriented x3, has very limited gross motor movement of upper and lower extremities as follows: LUE 1+/5, RUE 1-/5, LLE 1/5, RLE 1/5-sensation present in extremities mainly expressed as numbness--unable to move limbs against gravity yet  Data Reviewed: Basic Metabolic Panel:  Lab 02/20/12 1610 02/19/12 0445 02/18/12 0400 02/17/12 0401 02/16/12 0445 02/15/12 0450  NA 138 138 139 145 147* --  K 3.3* 2.9* 3.2* 3.4* 3.8 --  CL 107 105 104 112 111 --  CO2 20 22 24 29 29  --  GLUCOSE 106* 114* 104* 126* 130* --  BUN 18 18 21 22 18  --  CREATININE 0.58 0.48* 0.50 0.65 0.65 --  CALCIUM 7.9* 7.8* 8.0* 7.8* 8.0* --  MG -- -- -- -- 2.2 2.3  PHOS -- -- -- -- 3.2 3.0   Liver Function Tests:  Lab 02/20/12 0450 02/18/12 0400 02/17/12 0401 02/15/12 0450  AST 75* 95* 72* 55*  ALT 91* 83* 59* 60*  ALKPHOS 105 96 93 122*  BILITOT 0.6 0.7 0.4 0.6  PROT 5.8* 5.9* 5.6* 6.2  ALBUMIN 1.7* 1.5* 1.3* 1.4*   CBC:  Lab 02/20/12 0450 02/19/12 0445 02/18/12 0400 02/17/12 0401 02/16/12 0445  WBC 17.1* 16.9* 18.8* 15.0* 19.4*  NEUTROABS -- -- -- -- --  HGB 9.5* 9.4* 9.9* 7.0* 7.6*  HCT 27.7* 27.8* 28.8* 22.0* 24.4*  MCV 89.9 88.5 89.7 94.0 95.7  PLT 608* 602* 582* 601* 622*   BNP (last 3 results)  Basename 02/10/12 0358  PROBNP 707.7*   CBG:  Lab 02/18/12 1150 02/18/12 0805 02/18/12 0403 02/18/12 0020 02/17/12 2007  GLUCAP 111* 106* 105* 96 96    Recent Results (from the past 240 hour(s))  ANAEROBIC CULTURE     Status: Normal   Collection Time   02/10/12   4:32 PM      Component Value Range Status Comment   Specimen Description ABSCESS LEFT CHEST   Final    Special Requests PATIENT ON FOLLOWING ANCEF VANC   Final    Gram Stain     Final    Value: ABUNDANT WBC PRESENT, PREDOMINANTLY PMN     NO SQUAMOUS EPITHELIAL CELLS SEEN     ABUNDANT GRAM POSITIVE COCCI     IN CLUSTERS   Culture NO ANAEROBES ISOLATED   Final    Report Status 02/15/2012 FINAL   Final   GRAM STAIN     Status: Normal   Collection Time   02/10/12  4:32 PM      Component Value Range Status Comment   Specimen Description ABSCESS LEFT CHEST   Final    Special Requests PATIENT ON FOLLOWING ANCEF VANC   Final    Gram Stain     Final    Value: ABUNDANT WBC PRESENT, PREDOMINANTLY PMN     ABUNDANT GRAM POSITIVE COCCI IN CLUSTERS     Gram Stain Report Called to,Read Back By and Verified With: P.WEATHERLY,RN 02/10/12 1725 EHOWARD   Report Status 02/10/2012 FINAL   Final   CULTURE, ROUTINE-ABSCESS     Status: Normal   Collection Time   02/10/12  4:34 PM      Component Value Range Status Comment   Specimen Description ABSCESS LEFT CHEST   Final    Special Requests  PATIENT ON FOLLOWING VANC ANCEF   Final    Gram Stain     Final    Value: ABUNDANT WBC PRESENT, PREDOMINANTLY PMN     NO SQUAMOUS EPITHELIAL CELLS SEEN     ABUNDANT GRAM POSITIVE COCCI IN CLUSTERS     Gram Stain Report Called to,Read Back By and Verified With: Gram Stain Report Called to,Read Back By and Verified With: P WEATHERLY RN 02/10/12 1725 BY [email or web address] Performed at [facility]   Culture     Final    Value: ABUNDANT METHICILLIN RESISTANT STAPHYLOCOCCUS AUREUS     Note: RIFAMPIN AND GENTAMICIN SHOULD NOT BE USED AS SINGLE DRUGS FOR TREATMENT OF STAPH INFECTIONS. This organism DOES NOT demonstrate inducible Clindamycin resistance in vitro. CRITICAL RESULT CALLED TO, READ BACK BY AND VERIFIED WITH: SUSAN F 1/22 @      820 BY REAMM   Report Status 02/13/2012 FINAL   Final    Organism ID, Bacteria METHICILLIN  RESISTANT STAPHYLOCOCCUS AUREUS   Final   GRAM STAIN     Status: Normal   Collection Time   02/14/12  5:25 PM      Component Value Range Status Comment   Specimen Description ABSCESS NECK LEFT   Final    Special Requests PATIENT ON FOLLOWING VANCOMYCIN   Final    Gram Stain     Final    Value: RARE WBC PRESENT, PREDOMINANTLY PMN     NO ORGANISMS SEEN     Gram Stain Report Called to,Read Back By and Verified With: RN T. HARRELSON 02/14/12 1924 KERAN M.   Report Status 02/14/2012 FINAL   Final   ANAEROBIC CULTURE     Status: Normal   Collection Time   02/14/12  5:25 PM      Component Value Range Status Comment   Specimen Description ABSCESS NECK LEFT   Final    Special Requests PATIENT ON FOLLOWING VANCOMYCIN   Final    Gram Stain     Final    Value: RARE WBC PRESENT, PREDOMINANTLY PMN     NO ORGANISMS SEEN     Gram Stain Report Called to,Read Back By and Verified With: Gram Stain Report Called to,Read Back By and Verified With: RN T HARRELSON @18 :24 ON 02/14/12 BY Sallyanne Kuster M. Performed at [facility]   Culture NO ANAEROBES ISOLATED   Final    Report Status 02/19/2012 FINAL   Final   CULTURE, ROUTINE-ABSCESS     Status: Normal   Collection Time   02/14/12  5:25 PM      Component Value Range Status Comment   Specimen Description ABSCESS NECK LEFT   Final    Special Requests PATIENT ON FOLLOWING VANCOMYCIN   Final    Gram Stain     Final    Value: RARE WBC PRESENT, PREDOMINANTLY PMN     NO ORGANISMS SEEN     Gram Stain Report Called to,Read Back By and Verified With: Gram Stain Report Called to,Read Back By and Verified With: RN T. HARRELSON @19 :24 ON 02/14/12 BY Sallyanne Kuster M. Performed at [facility]   Culture     Final    Value: FEW METHICILLIN RESISTANT STAPHYLOCOCCUS AUREUS     Note: RIFAMPIN AND GENTAMICIN SHOULD NOT BE USED AS SINGLE DRUGS FOR TREATMENT OF STAPH INFECTIONS. This organism DOES NOT demonstrate inducible Clindamycin resistance in vitro.   Report Status  02/17/2012 FINAL   Final    Organism ID, Bacteria METHICILLIN RESISTANT STAPHYLOCOCCUS AUREUS  Final      Studies:  Recent x-ray studies have been reviewed in detail by the Attending Physician  Scheduled Meds:  Reviewed in detail by the Attending Physician   Junious Silk, ANP [facility] Office  [phone] Pager [phone]  On-Call/Text Page:      [email or web address]      password [email or web address]  If 7PM-7AM, please contact night-coverage [email or web address] Password [facility] 02/20/2012, 12:39 PM   LOS: 12 days   I have personally examined this patient and reviewed the entire database. I have reviewed the above note, made any necessary editorial changes, and agree with its content.  Lonia Blood, MD [facility]

## 2012-02-20 NOTE — Progress Notes (Signed)
Occupational Therapy Treatment Patient Details Name: Frank Brock MRN: [id number] DOB: [birth year] Today's Date: 02/20/2012 Time: 4696-2952 OT Time Calculation (min): 30 min  OT Assessment / Plan / Recommendation Comments on Treatment Session Pt demonstrates eagerness to engage with therapy progressing this session. Pt tolerated sit<>stand x2 this session. Pt remains excellent CIR candidate at this time.    Follow Up Recommendations  CIR    Barriers to Discharge       Equipment Recommendations  3 in 1 bedside comode;Tub/shower bench;Hospital bed;Wheelchair (measurements OT);Wheelchair cushion (measurements OT)    Recommendations for Other Services Rehab consult  Frequency Min 3X/week   Plan Discharge plan remains appropriate    Precautions / Restrictions Precautions Precautions: Cervical;Fall;Other (comment) Precaution Comments: watch BP   Pertinent Vitals/Pain Stable and monitored      ADL  Transfers/Ambulation Related to ADLs: sit<>stand Total+2 with hip flexion and requires bil LE blocked. Pt unable to maintain for >30 seconds at this time ADL Comments: Pt reports incr numbness and decr function of bil UE. Pt currently with Lt UE: gross grasp 3 out 5 cylindrical, inability to supinate, elbow flexion 2+ out 5, triceps for elbow extension 3- out 5, shoulder shrug present, shoulder flexion 2 - out 5 reports numbness, Rt UE : gross grasp of 3- out 5 cylindrical loose grasp, inability to supinate, elbow flexion 2- out 5 , elbow extension 3- out 5 , weak shoulder shrug, 2- out 5 shoulder flexion. Pt reports bil UE feeling symmetrical in numbness. Pt eager to begin threapy and verbalizes frustration with setback of transfer to step down ICU. Pt does not completely understand the "stepdown" transfer. Pt pleasant and requesting when therapy can come back.      OT Diagnosis:    OT Problem List:   OT Treatment Interventions:     OT Goals Acute Rehab OT Goals OT Goal Formulation:  With patient Time For Goal Achievement: 03/03/12 Potential to Achieve Goals: Good ADL Goals Pt Will Perform Eating: with mod assist;Supported;with assist to don/doff brace/orthosis;with adaptive utensils;with cueing (comment type and amount) ADL Goal: Eating - Progress: Progressing toward goals Pt Will Perform Grooming: with mod assist;Supported;with adaptive equipment;with cueing (comment type and amount) ADL Goal: Grooming - Progress: Progressing toward goals Pt Will Perform Upper Body Bathing: with mod assist;Supine, rolling right and/or left;Supine, head of bed up;Supported Arm Goals Additional Arm Goal #1: Pt will independently instruct caregivers to keep BUE elevated to decrease dependent edema Additional Arm Goal #2: Pt will demonstrate hand to mouth pattern with BUE in suported sitting Arm Goal: Additional Goal #2 - Progress: Progressing toward goals Miscellaneous OT Goals Miscellaneous OT Goal #1: Pt will maintain sitting EOB x 10 min with min A OT Goal: Miscellaneous Goal #1 - Progress: Progressing toward goals  Visit Information  Last OT Received On: 02/20/12 Assistance Needed: +2 PT/OT Co-Evaluation/Treatment: Yes    Subjective Data      Prior Functioning       Cognition  Overall Cognitive Status: Appears within functional limits for tasks assessed/performed Arousal/Alertness: Awake/alert Orientation Level: Appears intact for tasks assessed Behavior During Session: [facility] for tasks performed    Mobility  Shoulder Instructions Bed Mobility Bed Mobility: Supine to Sit;Sitting - Scoot to Edge of Bed;Sit to Supine Rolling Left: 2: Max assist;With rail Supine to Sit: 1: +2 Total assist;HOB elevated;With rails Supine to Sit: Patient Percentage: 30% Sitting - Scoot to Edge of Bed: 1: +2 Total assist Sitting - Scoot to Edge of Bed: Patient Percentage: 0%  Sit to Supine: 1: +2 Total assist;HOB flat Sit to Supine: Patient Percentage: 30% Details for Bed Mobility Assistance:  Pt initiating trunk and turning eyes hips toward Left side rolling. Pt positioned onto Rt elbow and activating core to pull back into upright posture. Pt posterior lean and given tacitle cues to initiate core and pt initiating  Transfers Transfers: Sit to Stand;Stand to Sit Sit to Stand: 1: +2 Total assist;From elevated surface;From bed Sit to Stand: Patient Percentage: 10% Stand to Sit: 1: +2 Total assist;To bed;To elevated surface Stand to Sit: Patient Percentage: 0% Details for Transfer Assistance: pt with bil Ue positioned on the back of a high back chair and supported. Pt required bil LE blocking. pt sit<>Stand x2 with ~10-30 seconds standing each attempt. pt with cervical flexion in soft collar, hip flexion and bil Knee buckling. pt could benefit from 3 muskeeter static standing attempt to allow better facilitation to initiate core. Pt initiating and excited to keep trying to stand       Exercises      Balance Static Sitting Balance Static Sitting - Balance Support: Bilateral upper extremity supported;Feet supported Static Sitting - Level of Assistance: 2: Max assist Static Sitting - Comment/# of Minutes: ~5 mins    End of Session OT - End of Session Activity Tolerance: Patient tolerated treatment well Patient left: in bed;with call bell/phone within reach Nurse Communication: Precautions  GO     Harrel Carina [facility] 02/20/2012, 12:03 PM Pager: [phone]

## 2012-02-20 NOTE — Consult Note (Signed)
WOC follow up Wound type: surgical Measurement:7cm x 2cm x 2cm with pocket at o'clock that is 3cm  Wound bed: beefy red, early granulation tissue, clean and moist Drainage (amount, consistency, odor) moderate serosanguinous in canister  Periwound:intact Dressing procedure/placement/frequency:1pc white foam placed in the wound bed into pocket at 3oclcok, then covered with black foam, drape and seal at , pt tolerated well. Did receive premedication prior to dressing change.  WOC will follow along for assistance with VAC dressing changes as needed  Frank Brock [employer] [phone]

## 2012-02-20 NOTE — Progress Notes (Addendum)
ANTICOAGULATION CONSULT NOTE - Follow Up Consult  Pharmacy Consult for Lovenox Indication: DVT  No Known Allergies  Patient Measurements: Height: 5\' 10"  (177.8 cm) Weight: 180 lb 1.9 oz (81.7 kg) IBW/kg (Calculated) : 73  Heparin Dosing Weight:   Vital Signs: Temp: 98.1 F (36.7 C) (01/29 1556) Temp src: Oral (01/29 1556) BP: 145/55 mmHg (01/29 1556) Pulse Rate: 82  (01/29 1556)  Labs:  Basename 02/20/12 0450 02/19/12 0445 02/18/12 0400  HGB 9.5* 9.4* --  HCT 27.7* 27.8* 28.8*  PLT 608* 602* 582*  APTT -- -- --  LABPROT -- -- --  INR -- -- --  HEPARINUNFRC -- -- --  CREATININE 0.58 0.48* 0.50  CKTOTAL -- -- --  CKMB -- -- --  TROPONINI -- -- --    Estimated Creatinine Clearance: 114.1 ml/min (by C-G formula based on Cr of 0.58).  Assessment: 51yom to start Lovenox for acute DVT of upper extremity. Patient was initially started on heparin (1/23) but then anticoagulation was stopped due to need for spinal surgery (1/23). MD has consulted pharmacy to restart anticoagulation for DVT treatment. - Hg 9.5 (low but stable), Plts 608 - No significant bleeding reported - Weight: 81kg, CrCl >100 ml/min  Goal of Therapy:  Anti-Xa level 0.6-1.2 units/ml 4hrs after LMWH dose given Monitor platelets by anticoagulation protocol: Yes   Plan:  1. Lovenox 80mg  (~1mg /kg) SQ q12h 2. CBC q72h while on Lovenox therapy 3. Follow-up long term anticoagulation plan  Cleon Dew [phone] 02/20/2012,6:06 PM

## 2012-02-20 NOTE — Progress Notes (Addendum)
                   [address]            [place] [address]          [phone]    6 Days Post-Op Procedure(s) (LRB): POSTERIOR CERVICAL LAMINECTOMY FOR EPIDURAL ABSCESS (N/A)  Subjective: Patient states his right arm feels different and he doesn't have as much strength  Objective: Vital signs in last 24 hours: Temp:  [97.2 F (36.2 C)-98.3 F (36.8 C)] 98 F (36.7 C) (01/29 0756) Pulse Rate:  [76-102] 92  (01/29 0437) Cardiac Rhythm:  [-] Normal sinus rhythm (01/28 2000) Resp:  [22-33] 24  (01/29 0437) BP: (145-176)/(65-88) 160/72 mmHg (01/29 0437) SpO2:  [89 %-98 %] 94 % (01/29 0437) Weight:  [81.7 kg (180 lb 1.9 oz)] 81.7 kg (180 lb 1.9 oz) (01/29 0437)     Intake/Output from previous day: 01/28 0701 - 01/29 0700 In: 1044 [P.O.:370; I.V.:174; IV Piggyback:500] Out: 1385 [Urine:1335; Drains:50]   Physical Exam:  Cardiovascular: RRR Pulmonary: Coarse breath sounds L>R Abdomen: Soft, non tender, bowel sounds present. Extremities: Mild bilateral lower extremity edema. Wounds: Vac in place Neurologic: able to squeeze with left hand but no right  Lab Results: CBC: Basename 02/20/12 0450 02/19/12 0445  WBC 17.1* 16.9*  HGB 9.5* 9.4*  HCT 27.7* 27.8*  PLT 608* 602*   BMET:  Basename 02/20/12 0450 02/19/12 0445  NA 138 138  K 3.3* 2.9*  CL 107 105  CO2 20 22  GLUCOSE 106* 114*  BUN 18 18  CREATININE 0.58 0.48*  CALCIUM 7.9* 7.8*    PT/INR: No results found for this basename: LABPROT,INR in the last 72 hours ABG:  INR: Will add last result for INR, ABG once components are confirmed Will add last 4 CBG results once components are confirmed  Assessment/Plan:  1. CV - SR 2.  Pulmonary - Chest CXR shows no pneumothorax and unchanged right mid and LUL air space opacities. 3.H and H stable at 9.5 and 27.7 4.WBC slightly increased to 17,100. On Vancomycin for MRSA bacteremia/chst wall abcess. Per infectious disease. 5.S/p post cervical  laminectomy for epidural abcess with acute paralysis. Regarding new complaint this am per neurosurgery 6.Supplement potassium  ZIMMERMAN,DONIELLE MPA-C 02/20/2012,9:00 AM

## 2012-02-20 NOTE — Progress Notes (Signed)
INFECTIOUS DISEASE PROGRESS NOTE  ID: Frank Brock is a 52 y.o. male with   Principal Problem:  *Sepsis Active Problems:  H/O hypercholesteremia  H/O stroke  Acute respiratory failure with hypoxia  Chest wall abscess  Lung abscess  Bacteremia due to methicillin resistant Staphylococcus aureus  S/P VATS (1/19)  DVT of upper extremity (deep vein thrombosis)  Quadriplegia  Paraspinal epidural abscess  Hypokalemia  Anemia  Acute hyperglycemia  Subjective: Frustrated with weakness.  Abtx:  Anti-infectives     Start     Dose/Rate Route Frequency Ordered Stop   02/15/12 1130   gentamicin (GARAMYCIN) IVPB 100 mg  Status:  Discontinued        100 mg 200 mL/hr over 30 Minutes Intravenous Every 8 hours 02/15/12 1037 02/15/12 1042   02/15/12 1130   gentamicin (GARAMYCIN) IVPB 80 mg  Status:  Discontinued        80 mg 100 mL/hr over 30 Minutes Intravenous Every 8 hours 02/15/12 1042 02/18/12 0958   02/14/12 1654   bacitracin 50,000 Units in sodium chloride irrigation 0.9 % 500 mL irrigation  Status:  Discontinued          As needed 02/14/12 1706 02/14/12 1822   02/12/12 2000   vancomycin (VANCOCIN) 1,250 mg in sodium chloride 0.9 % 250 mL IVPB        1,250 mg 166.7 mL/hr over 90 Minutes Intravenous Every 8 hours 02/12/12 1333     02/11/12 1100   vancomycin (VANCOCIN) IVPB 1000 mg/200 mL premix  Status:  Discontinued        1,000 mg 200 mL/hr over 60 Minutes Intravenous Every 8 hours 02/11/12 1042 02/12/12 1332   02/11/12 0045   vancomycin (VANCOCIN) IVPB 1000 mg/200 mL premix  Status:  Discontinued     Comments: CONTINUE TILL FURTHER NOTICE      1,000 mg 200 mL/hr over 60 Minutes Intravenous Every 12 hours 02/10/12 1803 02/11/12 1041   02/10/12 2200   ceFAZolin (ANCEF) IVPB 2 g/50 mL premix  Status:  Discontinued        2 g 100 mL/hr over 30 Minutes Intravenous 3 times per day 02/10/12 1610 02/10/12 1755   02/10/12 2200   piperacillin-tazobactam (ZOSYN) IVPB 3.375 g   Status:  Discontinued        3.375 g 12.5 mL/hr over 240 Minutes Intravenous 3 times per day 02/10/12 1834 02/11/12 1130   02/10/12 1400   vancomycin (VANCOCIN) IVPB 1000 mg/200 mL premix  Status:  Discontinued        1,000 mg 200 mL/hr over 60 Minutes Intravenous Every 8 hours 02/10/12 1111 02/10/12 1755   02/10/12 1200   cefTRIAXone (ROCEPHIN) 1 g in dextrose 5 % 50 mL IVPB  Status:  Discontinued        1 g 100 mL/hr over 30 Minutes Intravenous Every 12 hours 02/10/12 1108 02/10/12 1610   02/09/12 1800   vancomycin (VANCOCIN) IVPB 1000 mg/200 mL premix  Status:  Discontinued        1,000 mg 200 mL/hr over 60 Minutes Intravenous Every 12 hours 02/09/12 1318 02/10/12 1111   02/09/12 0600   vancomycin (VANCOCIN) 1,250 mg in sodium chloride 0.9 % 250 mL IVPB  Status:  Discontinued        1,250 mg 166.7 mL/hr over 90 Minutes Intravenous Every 24 hours 02/08/12 1720 02/09/12 1318   02/08/12 2200   piperacillin-tazobactam (ZOSYN) IVPB 3.375 g  Status:  Discontinued  3.375 g 12.5 mL/hr over 240 Minutes Intravenous 3 times per day 02/08/12 1649 02/10/12 1108   02/08/12 2000   oseltamivir (TAMIFLU) capsule 75 mg  Status:  Discontinued        75 mg Oral 2 times daily 02/08/12 1720 02/10/12 1108   02/08/12 1700   vancomycin (VANCOCIN) IVPB 1000 mg/200 mL premix  Status:  Discontinued        1,000 mg 200 mL/hr over 60 Minutes Intravenous Every 12 hours 02/08/12 1649 02/08/12 1720   02/08/12 1530   vancomycin (VANCOCIN) IVPB 1000 mg/200 mL premix        1,000 mg 200 mL/hr over 60 Minutes Intravenous  Once 02/08/12 1527 02/08/12 2000   02/08/12 1530  piperacillin-tazobactam (ZOSYN) IVPB 3.375 g       3.375 g 12.5 mL/hr over 240 Minutes Intravenous  Once 02/08/12 1527 02/08/12 2004          Medications:  Scheduled:   . ipratropium  0.5 mg Nebulization Q6H   And  . albuterol  2.5 mg Nebulization Q6H  . antiseptic oral rinse  1 application Mouth Rinse QID  . bisacodyl  10 mg  Oral Daily  . chlorhexidine  15 mL Mouth Rinse BID  . feeding supplement  237 mL Oral BID BM  . feeding supplement  30 mL Oral BID  . pantoprazole  40 mg Oral Q1200  . sodium chloride  10-40 mL Intracatheter Q12H  . sodium chloride  3 mL Intravenous Q12H  . sodium chloride      . vancomycin  1,250 mg Intravenous Q8H    Objective: Vital signs in last 24 hours: Temp:  [97.2 F (36.2 C)-98.3 F (36.8 C)] 98.2 F (36.8 C) (01/29 1200) Pulse Rate:  [83-102] 83  (01/29 1200) Resp:  [21-33] 28  (01/29 1200) BP: (145-175)/(65-77) 165/77 mmHg (01/29 1200) SpO2:  [89 %-98 %] 97 % (01/29 1200) Weight:  [81.7 kg (180 lb 1.9 oz)] 81.7 kg (180 lb 1.9 oz) (01/29 0437)   General appearance: alert, cooperative and mild distress Resp: clear to auscultation bilaterally Chest wall: wound vac border of L sternum Cardio: regular rate and rhythm GI: normal findings: bowel sounds normal and soft, non-tender Neurologic: Motor: increased strength LUE vs RUE. wiggles toes, can push against resistance.   Lab Results  Basename 02/20/12 0450 02/19/12 0445  WBC 17.1* 16.9*  HGB 9.5* 9.4*  HCT 27.7* 27.8*  NA 138 138  K 3.3* 2.9*  CL 107 105  CO2 20 22  BUN 18 18  CREATININE 0.58 0.48*  GLU -- --   Liver Panel  Basename 02/20/12 0450 02/18/12 0400  PROT 5.8* 5.9*  ALBUMIN 1.7* 1.5*  AST 75* 95*  ALT 91* 83*  ALKPHOS 105 96  BILITOT 0.6 0.7  BILIDIR -- --  IBILI -- --   Sedimentation Rate No results found for this basename: ESRSEDRATE in the last 72 hours C-Reactive Protein No results found for this basename: CRP:2 in the last 72 hours  Microbiology: Recent Results (from the past 240 hour(s))  ANAEROBIC CULTURE     Status: Normal   Collection Time   02/10/12  4:32 PM      Component Value Range Status Comment   Specimen Description ABSCESS LEFT CHEST   Final    Special Requests PATIENT ON FOLLOWING ANCEF VANC   Final    Gram Stain     Final    Value: ABUNDANT WBC PRESENT,  PREDOMINANTLY PMN  NO SQUAMOUS EPITHELIAL CELLS SEEN     ABUNDANT GRAM POSITIVE COCCI     IN CLUSTERS   Culture NO ANAEROBES ISOLATED   Final    Report Status 02/15/2012 FINAL   Final   GRAM STAIN     Status: Normal   Collection Time   02/10/12  4:32 PM      Component Value Range Status Comment   Specimen Description ABSCESS LEFT CHEST   Final    Special Requests PATIENT ON FOLLOWING ANCEF VANC   Final    Gram Stain     Final    Value: ABUNDANT WBC PRESENT, PREDOMINANTLY PMN     ABUNDANT GRAM POSITIVE COCCI IN CLUSTERS     Gram Stain Report Called to,Read Back By and Verified With: P.WEATHERLY,RN 02/10/12 1725 EHOWARD   Report Status 02/10/2012 FINAL   Final   CULTURE, ROUTINE-ABSCESS     Status: Normal   Collection Time   02/10/12  4:34 PM      Component Value Range Status Comment   Specimen Description ABSCESS LEFT CHEST   Final    Special Requests PATIENT ON FOLLOWING VANC ANCEF   Final    Gram Stain     Final    Value: ABUNDANT WBC PRESENT, PREDOMINANTLY PMN     NO SQUAMOUS EPITHELIAL CELLS SEEN     ABUNDANT GRAM POSITIVE COCCI IN CLUSTERS     Gram Stain Report Called to,Read Back By and Verified With: Gram Stain Report Called to,Read Back By and Verified With: P WEATHERLY RN 02/10/12 1725 BY [email or web address] Performed at [facility]   Culture     Final    Value: ABUNDANT METHICILLIN RESISTANT STAPHYLOCOCCUS AUREUS     Note: RIFAMPIN AND GENTAMICIN SHOULD NOT BE USED AS SINGLE DRUGS FOR TREATMENT OF STAPH INFECTIONS. This organism DOES NOT demonstrate inducible Clindamycin resistance in vitro. CRITICAL RESULT CALLED TO, READ BACK BY AND VERIFIED WITH: SUSAN F 1/22 @      820 BY REAMM   Report Status 02/13/2012 FINAL   Final    Organism ID, Bacteria METHICILLIN RESISTANT STAPHYLOCOCCUS AUREUS   Final   GRAM STAIN     Status: Normal   Collection Time   02/14/12  5:25 PM      Component Value Range Status Comment   Specimen Description ABSCESS NECK LEFT   Final    Special Requests  PATIENT ON FOLLOWING VANCOMYCIN   Final    Gram Stain     Final    Value: RARE WBC PRESENT, PREDOMINANTLY PMN     NO ORGANISMS SEEN     Gram Stain Report Called to,Read Back By and Verified With: RN T. HARRELSON 02/14/12 1924 KERAN M.   Report Status 02/14/2012 FINAL   Final   ANAEROBIC CULTURE     Status: Normal   Collection Time   02/14/12  5:25 PM      Component Value Range Status Comment   Specimen Description ABSCESS NECK LEFT   Final    Special Requests PATIENT ON FOLLOWING VANCOMYCIN   Final    Gram Stain     Final    Value: RARE WBC PRESENT, PREDOMINANTLY PMN     NO ORGANISMS SEEN     Gram Stain Report Called to,Read Back By and Verified With: Gram Stain Report Called to,Read Back By and Verified With: RN T HARRELSON @18 :24 ON 02/14/12 BY Sallyanne Kuster M. Performed at [facility]   Culture NO ANAEROBES ISOLATED   Final  Report Status 02/19/2012 FINAL   Final   CULTURE, ROUTINE-ABSCESS     Status: Normal   Collection Time   02/14/12  5:25 PM      Component Value Range Status Comment   Specimen Description ABSCESS NECK LEFT   Final    Special Requests PATIENT ON FOLLOWING VANCOMYCIN   Final    Gram Stain     Final    Value: RARE WBC PRESENT, PREDOMINANTLY PMN     NO ORGANISMS SEEN     Gram Stain Report Called to,Read Back By and Verified With: Gram Stain Report Called to,Read Back By and Verified With: RN T. HARRELSON @19 :24 ON 02/14/12 BY Sallyanne Kuster M. Performed at [facility]   Culture     Final    Value: FEW METHICILLIN RESISTANT STAPHYLOCOCCUS AUREUS     Note: RIFAMPIN AND GENTAMICIN SHOULD NOT BE USED AS SINGLE DRUGS FOR TREATMENT OF STAPH INFECTIONS. This organism DOES NOT demonstrate inducible Clindamycin resistance in vitro.   Report Status 02/17/2012 FINAL   Final    Organism ID, Bacteria METHICILLIN RESISTANT STAPHYLOCOCCUS AUREUS   Final     Studies/Results: Dg Chest Port 1 View  02/20/2012  *RADIOLOGY REPORT*  Clinical Data: Post chest tube removal  PORTABLE  CHEST - 1 VIEW  Comparison: 02/19/2012; 02/18/2012; 02/10/2012; chest CT - 02/10/2012  Findings:  Grossly unchanged cardiac silhouette and mediastinal contours. Interval removal of left-sided chest tube and left jugular approach central venous catheter.  No definite pneumothorax.  Grossly unchanged right mid and left upper lung heterogeneous air space opacities. There is grossly unchanged mild diffuse slightly nodular thickening of the pulmonary interstitium.  No new focal airspace opacities. No definite pleural effusion.  Unchanged bones.  IMPRESSION: 1.  Interval removal of support apparatus.  No pneumothorax. 2.  Grossly unchanged right mid and left upper lobe heterogeneous air space opacities worrisome for multifocal infection.  A follow- up chest radiograph in 4 to 6 weeks after treatment is recommended to ensure resolution.   Original Report Authenticated By: Tacey Ruiz, MD    Dg Chest Port 1 View  02/19/2012  *RADIOLOGY REPORT*  Clinical Data: To followup chest tube  PORTABLE CHEST - 1 VIEW  Comparison: 02/18/2012  Findings: Left chest tube and left central line remain in stable position.  No pneumothorax.  Patchy bilateral airspace opacities are again noted, slightly improved since prior study.  Heart is borderline in size.  Mild hyperinflation.  No visible effusions.  IMPRESSION: Slight improvement in patchy bilateral airspace opacities.  No pneumothorax.   Original Report Authenticated By: Charlett Nose, M.D.      Assessment/Plan: Epidural Abscess with paralysis (C2-C7)  Laminectomy/I & D 1-23  MRSA bacteremia, chest wall abscess  TEE (-)   Total days of antibiotics: 12 (vanco) Would continue his anbx for 6 weeks from the date of his I & D Available if questions      Frank Brock Infectious Diseases [phone] 02/20/2012, 3:02 PM   LOS: 12 days

## 2012-02-20 NOTE — Progress Notes (Signed)
Physical Therapy Treatment Patient Details Name: Frank Brock MRN: [id number] DOB: [birth year] Today's Date: 02/20/2012 Time: 1011-1040 PT Time Calculation (min): 29 min  PT Assessment / Plan / Recommendation Comments on Treatment Session  Pt very motivated and willing to work with therapy.  Pt continues to c/o cervical pain however increase tolearance sitting upright due to soft collar.  Pt continues to have difficulty keeping trunk upright.  Initiated standing to allow weight bearing through LE.  Pt unable to complete upright standing with +2 (A).  Increased overall UE weakness per pt.      Follow Up Recommendations  CIR;Supervision/Assistance - 24 hour     Equipment Recommendations  Other (comment) (TBA)    Recommendations for Other Services Rehab consult  Frequency Min 4X/week   Plan Discharge plan remains appropriate;Frequency remains appropriate    Precautions / Restrictions Precautions Precautions: Cervical;Fall;Other (comment) Precaution Comments: watch BP   Pertinent Vitals/Pain C/o mild cervical pain sitting EOB; vitals stable; premedicated    Mobility  Bed Mobility Bed Mobility: Supine to Sit;Sitting - Scoot to Edge of Bed;Sit to Supine Rolling Left: 2: Max assist;With rail Supine to Sit: 1: +2 Total assist;HOB elevated;With rails Supine to Sit: Patient Percentage: 30% Sitting - Scoot to Edge of Bed: 1: +2 Total assist Sitting - Scoot to Edge of Bed: Patient Percentage: 0% Sit to Supine: 1: +2 Total assist;HOB flat Sit to Supine: Patient Percentage: 30% Details for Bed Mobility Assistance: (A) to complete roll however pt able to initiate roll with trunk and turning eyes and hips toward left side.  (A) needed to elevate trunk OOB with max cues for technique. Transfers Transfers: Sit to Stand;Stand to Sit Sit to Stand: 1: +2 Total assist;From elevated surface;From bed Sit to Stand: Patient Percentage: 10% Stand to Sit: 1: +2 Total assist;To bed;To elevated  surface Stand to Sit: Patient Percentage: 0% Details for Transfer Assistance: pt with bil Ue positioned on the back of a high back chair and supported. Pt required bil LE blocking. pt sit<>Stand x2 with ~10-30 seconds standing each attempt. pt with cervical flexion in soft collar, hip flexion and bil Knee buckling. pt could benefit from 3 muskeeter static standing attempt to allow better facilitation to initiate core. Pt initiating and excited to keep trying to stand Ambulation/Gait Ambulation/Gait Assistance: Not tested (comment)    Exercises General Exercises - Lower Extremity Ankle Circles/Pumps: AAROM;Both;5 reps;Supine Heel Slides: AAROM;Both;5 reps;Supine Hip ABduction/ADduction: AAROM;Both;Other reps (comment);Supine   PT Diagnosis:    PT Problem List:   PT Treatment Interventions:     PT Goals Acute Rehab PT Goals PT Goal Formulation: With patient Time For Goal Achievement: 03/03/12 Potential to Achieve Goals: Good Pt will Roll Supine to Right Side: with mod assist;with rail PT Goal: Rolling Supine to Right Side - Progress: Progressing toward goal Pt will Roll Supine to Left Side: with mod assist;with rail PT Goal: Rolling Supine to Left Side - Progress: Not met Pt will go Supine/Side to Sit: with +2 total assist;with HOB not 0 degrees (comment degree);with rail PT Goal: Supine/Side to Sit - Progress: Not met Pt will Sit at [facility] of Bed: with min assist;1-2 min;with bilateral upper extremity support PT Goal: Sit at Edge Of Bed - Progress: Not met Pt will go Sit to Supine/Side: with mod assist;with HOB 0 degrees;with rail PT Goal: Sit to Supine/Side - Progress: Progressing toward goal  Visit Information  Last PT Received On: 02/20/12 Assistance Needed: +2 PT/OT Co-Evaluation/Treatment: Yes    Subjective Data  Subjective: "Are yall going to come tomorrow?" Patient Stated Goal: return to [place]; play golf   Cognition  Overall Cognitive Status: Appears within  functional limits for tasks assessed/performed Arousal/Alertness: Awake/alert Orientation Level: Appears intact for tasks assessed Behavior During Session: [facility] for tasks performed    Balance  Balance Balance Assessed: Yes Static Sitting Balance Static Sitting - Balance Support: Bilateral upper extremity supported;Feet supported Static Sitting - Level of Assistance: 2: Max assist Static Sitting - Comment/# of Minutes: ~5 minutes Dynamic Sitting Balance Dynamic Sitting - Balance Support: Right upper extremity supported;Left upper extremity supported;Feet supported Dynamic Sitting - Level of Assistance: 2: Max assist  End of Session PT - End of Session Equipment Utilized During Treatment: Gait belt (soft collar) Activity Tolerance: Patient tolerated treatment well Patient left: in bed;with call bell/phone within reach Nurse Communication: Mobility status;Patient requests pain meds   GP     Tiki Tucciarone 02/20/2012, 12:07 PM  Jake Shark, PT DPT [phone]

## 2012-02-20 NOTE — Progress Notes (Signed)
Subjective: Patient reports worked hard with PT  Objective: Vital signs in last 24 hours: Temp:  [97.2 F (36.2 C)-98.3 F (36.8 C)] 98.1 F (36.7 C) (01/29 0437) Pulse Rate:  [76-102] 92  (01/29 0437) Resp:  [21-33] 24  (01/29 0437) BP: (140-176)/(65-88) 160/72 mmHg (01/29 0437) SpO2:  [89 %-98 %] 94 % (01/29 0437) Weight:  [81.7 kg (180 lb 1.9 oz)] 81.7 kg (180 lb 1.9 oz) (01/29 0437)  Intake/Output from previous day: 01/28 0701 - 01/29 0700 In: 420 [P.O.:370; I.V.:50] Out: 1385 [Urine:1335; Drains:50] Intake/Output this shift: Total I/O In: 10 [I.V.:10] Out: 350 [Urine:350]  Physical Exam: Strength continuing to improve in both upper and lower extremities.  Denies numbness.  Neck not too painful  Lab Results:  Basename 02/20/12 0450 02/19/12 0445  WBC 17.1* 16.9*  HGB 9.5* 9.4*  HCT 27.7* 27.8*  PLT 608* 602*   BMET  Basename 02/19/12 0445 02/18/12 0400  NA 138 139  K 2.9* 3.2*  CL 105 104  CO2 22 24  GLUCOSE 114* 104*  BUN 18 21  CREATININE 0.48* 0.50  CALCIUM 7.8* 8.0*    Studies/Results: Dg Chest Port 1 View  02/19/2012  *RADIOLOGY REPORT*  Clinical Data: To followup chest tube  PORTABLE CHEST - 1 VIEW  Comparison: 02/18/2012  Findings: Left chest tube and left central line remain in stable position.  No pneumothorax.  Patchy bilateral airspace opacities are again noted, slightly improved since prior study.  Heart is borderline in size.  Mild hyperinflation.  No visible effusions.  IMPRESSION: Slight improvement in patchy bilateral airspace opacities.  No pneumothorax.   Original Report Authenticated By: Charlett Nose, M.D.    Dg Chest Port 1 View  02/18/2012  *RADIOLOGY REPORT*  Clinical Data: Extubated.  Follow-up chest tube.  PORTABLE CHEST - 1 VIEW  Comparison: 02/17/2012  Findings: Endotracheal tube has been removed.  Left internal jugular central line has its tip in the SVC at the azygos level. Left-sided chest tube remains in place.  No pneumothorax.   Patchy pulmonary density persists particularly in the left upper lobe, left lower lobe and right lower lobe.  This appears similar or slightly more prominent.  No qualitatively new finding.  IMPRESSION: Endotracheal tube removed.  Left chest tube remains in place without pneumothorax.  Patchy pulmonary density, possibly slightly increased since yesterday's exam.   Original Report Authenticated By: Paulina Fusi, M.D.     Assessment/Plan: Good candidate for Rehab.  Continued significant improvement.    LOS: 12 days    Dorian Heckle, MD 02/20/2012, 5:48 AM

## 2012-02-21 ENCOUNTER — Other Ambulatory Visit: Payer: Medicare HMO | Admitting: Lab

## 2012-02-21 ENCOUNTER — Ambulatory Visit: Payer: Medicare HMO | Admitting: Internal Medicine

## 2012-02-21 LAB — CBC
HCT: 28.3 % — ABNORMAL LOW (ref 39.0–52.0)
Hemoglobin: 9.5 g/dL — ABNORMAL LOW (ref 13.0–17.0)
MCH: 30.2 pg (ref 26.0–34.0)
MCHC: 33.6 g/dL (ref 30.0–36.0)
RBC: 3.15 MIL/uL — ABNORMAL LOW (ref 4.22–5.81)

## 2012-02-21 MED ORDER — SODIUM CHLORIDE 0.9 % IJ SOLN
INTRAMUSCULAR | Status: AC
  Filled 2012-02-21: qty 10

## 2012-02-21 MED ORDER — SODIUM CHLORIDE 0.9 % IJ SOLN
INTRAMUSCULAR | Status: AC
  Administered 2012-02-22: 10 mL
  Filled 2012-02-21: qty 10

## 2012-02-21 MED ORDER — AMLODIPINE BESYLATE 10 MG PO TABS
10.0000 mg | ORAL_TABLET | Freq: Every day | ORAL | Status: DC
  Administered 2012-02-21 – 2012-02-25 (×5): 10 mg via ORAL
  Filled 2012-02-21 (×5): qty 1

## 2012-02-21 NOTE — Progress Notes (Signed)
Physical Therapy Treatment Patient Details Name: Frank Brock MRN: [id number] DOB: 03-Jun-1960 Today's Date: 02/21/2012 Time: 0812-0850 PT Time Calculation (min): 38 min  PT Assessment / Plan / Recommendation Comments on Treatment Session  Pt very motivated about getting OOB. Pt able to tolerate using maximove and upright posture.  Pt educated on pressure relief and importance of changing position and sitting upright.    Follow Up Recommendations  CIR;Supervision/Assistance - 24 hour     Equipment Recommendations  Other (comment)    Recommendations for Other Services Rehab consult  Frequency Min 4X/week   Plan Discharge plan remains appropriate;Frequency remains appropriate    Precautions / Restrictions Precautions Precautions: Cervical;Fall;Other (comment)   Pertinent Vitals/Pain C/o 4/10 neck pain; premedicated    Mobility  Bed Mobility Bed Mobility: Rolling Right;Rolling Left Rolling Right: 2: Max assist Rolling Left: 2: Max assist Details for Bed Mobility Assistance: (A) to complete roll however pt able to initiate trunk left and right Transfers Transfers:  (Used maximove to get pt OOB.) Details for Transfer Assistance: Used maximove to get OOB and educated on the importance of pressure relief. Ambulation/Gait Ambulation/Gait Assistance: Not tested (comment)    Exercises     PT Diagnosis:    PT Problem List:   PT Treatment Interventions:     PT Goals Acute Rehab PT Goals PT Goal Formulation: With patient Time For Goal Achievement: 03/03/12 Potential to Achieve Goals: Good Pt will Roll Supine to Right Side: with mod assist;with rail PT Goal: Rolling Supine to Right Side - Progress: Progressing toward goal Pt will Roll Supine to Left Side: with mod assist;with rail PT Goal: Rolling Supine to Left Side - Progress: Progressing toward goal  Visit Information  Last PT Received On: 02/21/12 Assistance Needed: +2    Subjective Data  Subjective: "I'm excited  to get out of bed." Patient Stated Goal: return to [place]; play golf   Cognition  Overall Cognitive Status: Appears within functional limits for tasks assessed/performed Arousal/Alertness: Awake/alert Orientation Level: Appears intact for tasks assessed Behavior During Session: [facility] for tasks performed    Balance     End of Session PT - End of Session Equipment Utilized During Treatment: Gait belt Activity Tolerance: Patient tolerated treatment well Patient left: in chair;with call bell/phone within reach;with nursing in room Nurse Communication: Mobility status;Patient requests pain meds   GP     Frank Brock 02/21/2012, 10:01 AM  Jake Shark, PT DPT [phone]

## 2012-02-21 NOTE — Progress Notes (Signed)
Utilization review completed.  

## 2012-02-21 NOTE — Clinical Social Work Placement (Addendum)
    Clinical Social Work Department CLINICAL SOCIAL WORK PLACEMENT NOTE 02/21/2012  Patient:  Frank Brock, Frank Brock  Account Number:  [id number] Admit date:  02/08/2012  Clinical Social Worker:  Theresia Bough, Theresia Majors  Date/time:  02/21/2012 11:30 AM  Clinical Social Work is seeking post-discharge placement for this patient at the following level of care:   SKILLED NURSING   (*CSW will update this form in Epic as items are completed)   02/21/2012  Patient/family provided with [facility] Department of Clinical Social Work's list of facilities offering this level of care within the geographic area requested by the patient (or if unable, by the patient's family).  02/21/2012  Patient/family informed of their freedom to choose among providers that offer the needed level of care, that participate in Medicare, Medicaid or managed care program needed by the patient, have an available bed and are willing to accept the patient.  02/21/2012  Patient/family informed of [facility]' ownership interest in [facility], as well as of the fact that they are under no obligation to receive care at this facility.  PASARR submitted to [employer] on 02/21/2012 PASARR number received from [employer] on 02/21/12 [id number]  FL2 transmitted to all facilities in geographic area requested by pt/family on  02/21/2012 FL2 transmitted to all facilities within larger geographic area on    Patient informed that his/her managed care company has contracts with or will negotiate with  certain facilities, including the following:     Patient/family informed of bed offers received:  02/23/12 Patient chooses bed at  Physician recommends and patient chooses bed at    Patient to be transferred to  on   Patient to be transferred to facility by   The following physician request were entered in Epic:   Additional Comments:

## 2012-02-21 NOTE — Progress Notes (Signed)
[facility] Progress Note [place] TEAM 1 - Stepdown/ICU TEAM   Frank Brock ZHY:[id number] DOB: [birth year] DOA: 02/08/2012 PCP: [name], MD  Brief narrative: 52 year old male patient with tobacco abuse presented to a local urgent care with complaints of left shoulder pain at which time a CXR was unremarkable. Because of persistent back pain went to a chiropractor and repeat CXR ? Mass in upper lung. CT of the chest on 02/04/12 demonstrated left upper lobe soft tissue mass and ?? Metastatic lesions. This was associated with leukocytosis.He was sent out with pain medications. He was subsequently evaluated by hematology/oncology on 02/06/2012 (Dr. [name]) where plans were to proceed with a staging workup including a PET scan as well as MRI of the brain to be done as soon as possible; plans were also to refer the patient to interventional radiology for consideration of CT guided needle aspiration of the left upper lobe lung mass for tissue diagnosis.    Patient returned back to the emergency department on 02/08/2012 with hypoxemic respiratory failure. He was admitted by pulmonary critical care medicine. During the hospitalization his respiratory status worsened and followup x-ray revealed bilateral pulmonary infiltrates. CT of the chest also revealed a large left-sided chest wall abscess that communicated with an intrathoracic pulmonary abscess. Also interval development of rapidly progressive bilateral pulmonary infiltrates c/w infiltrates and septic emboli. Thoracic surgery consultation was obtained and patient was urgently transferred to [facility] for emergency surgery for chest wall abscess presumed staphylococcal etiology. He subsequently underwent incision and drainage of the chest wall abscess and mini-thoracotomy with placement of wound VAC and insertion of left chest tube on 02/10/2012.   Subsequent blood cultures were positive for methicillin-resistant Staphylococcus  aureus. Infectious disease service was consulted. Central line placed at time of admission was removed per ID recommendations. 2-D echocardiogram did not demonstrate evidence of vegetation. He underwent a TEE on 02/14/2012 that revealed no vegetations or atrial thrombus.  On 02/13/2012 patient was found to have a left upper extremity DVT involving the subclavian axillary vein and was started on heparin. This was later stopped after neurological exam revealed patient to be quadriplegic when sedation was weaned off. Neurology initially evaluated the patient. An MRI was ordered that revealed an epidural compressive lesion in the cervical spinal cord with edema. This prompted an urgent neurosurgical consultation. Patient subsequently underwent posterior cervical laminectomy for an epidural abscess at C3-3-C 7 on 02/14/2012. In the past 24 hours patient has regained very weak gross motor activity in the upper and lower extremities.  From a respiratory standpoint he was able to be extubated on 02/17/2012 and is stable on nasal cannula oxygen. He continues on vancomycin and gentamicin for widely disseminated MRSA infections as noted above. Plans are to removehis chest tubes soon. Wound VAC remains in place. PT and OT are continued to work with the patient and when medically stable neurosurgery recommends an inpatient rehabilitation.   Assessment/Plan:  Sepsis -Due to widely disseminated MRSA-hemodynamically stable  Bacteremia due to methicillin resistant Staphylococcus aureus -ID following -Continue Vanco for a total of 6 weeks from date of last I&D i.e. epidural abscess procedure -TEE w/o vegetation/endocarditis  Quadriplegia/Paraspinal epidural abscess -NS following-etiology felt to be due to spinal cord infarction although intra-op cx's were positive for MRSA -PT/OT- soft C-collar -CIR recommended -Very deconditioned and is max assist with PT and rapidly fatigued therefore dc to CIR should be  delayed to ensure future success with therapy  Acute respiratory failure with hypoxia: A) Chest wall  abscess/Lung abscess--S/P VATS (1/19) -CVTS following -CT's have been dc'd -Cont. Supportive care  DVT of upper extremity (02/13/12) -Involves left subclavian and axillary veins -Anti-coagulation stopped 2/2 acute quadraplegia from epidural abscess and need for surgery -Anticoag resumed 1/29 with Lovenox- will need to follow clinical progress very closely to assure no acute bleeding complications related to spinal surgery therefore will need to remain in step down for next 48 hours  Hypokalemia -Replete- follow lytes  Hypertension Start amlodipine  Acute hyperglycemia -CBG much better and likely up 2/2 dextrose IVF -HgbA1c pending  Anemia of critical illness -Baseline hgb 13- current is 9.4 -Post 2 units PRBC's this admit  H/O hypercholesteremia  H/O stroke  DVT prophylaxis: lovenox Code Status: Full Family Communication: Patient Disposition Plan: Stepdown for at least an additional 48 hours to monitor for complications from reinitiation of heparin the plan is to transfer to neuro floor soon so patient can proceed with daily PT  Consultants: Cardiothoracic surgery Infectious disease Neurology Neurosurgery  Procedures: Irrigation and debridement of left chest wall abscess with minithoracotomy, placement of left chest tube and wound VAC (02/10/2012)  Reexploration of left chest debridement site with further wound debridement and placement of wound VAC (02/12/2012)  Posterior cervical laminectomy for epidural abscess C3-C7 (02/14/2012)  TEE (02/14/2012): Study Conclusions - Left ventricle: Systolic function was normal. The estimated ejection fraction was in the range of 60% to 65%. Wall motion was normal; there were no regional wall motion abnormalities. - Left atrium: No evidence of thrombus in the atrial cavity or appendage. Impressions: - No vegetations.  2-D  echocardiogram: (02/11/12): Study Conclusions - Left ventricle: The cavity size was normal. Wall thickness was normal. The estimated ejection fraction was 55%. Images were inadequate for LV wall motion assessment. Features are consistent with a pseudonormal left ventricular filling pattern, with concomitant abnormal relaxation and increased filling pressure (grade 2 diastolic dysfunction). - Aortic valve: Poorly visualized. Probably trileaflet. There was no stenosis. - Mitral valve: No significant regurgitation. - Left atrium: The atrium was mildly dilated. - Right ventricle: The cavity size was normal. Systolic function was normal. - Pulmonary arteries: No complete TR doppler jet so unable to estimate PA systolic pressure. - Inferior vena cava: The vessel was normal in size; the respirophasic diameter changes were in the normal range (= 50%); findings are consistent with normal central venous pressure. Impressions: - Technically difficult study with very poor acoustic windows.Normal LV size with EF 55%. Unable to comment on individual wall segments due to poor images. Normal RV size and systolic function. No significant valvular abnormalities noted.  Left upper extremity venous duplex (02/13/12): Findings consistent with deep vein thrombosis involving the left subclavian vein and left axillary vein.  Microbiology:  MRSA PCR 1/17 >> POSITIVE  Resp 1/17 >> MRSA  Urine 1/17 >> negative  Blood 1/17 >> 2/2 MRSA  Blood 1/18 >> neg  Abscess 1/19 >> MRSA  Antibiotics: Osletamivir 1/17 >> 1/19  Zosyn 1/17 >> 1/20  Vanc 1/17 >>    HPI/Subjective: Patient alert and is up and lounge chair outside of room at external window. Endorses eagerness to begin aggressive physical therapy but understands limitations because of profound deconditioning and that this will be a long course the patient emphasizes goal is to be able to walk again.   Objective: Blood pressure 164/73, pulse 85,  temperature 97.8 F (36.6 C), temperature source Oral, resp. rate 25, height 5\' 10"  (1.778 m), weight 81.7 kg (180 lb 1.9 oz), SpO2 97.00%.  Intake/Output  Summary (Last 24 hours) at 02/21/12 1332 Last data filed at 02/21/12 0743  Gross per 24 hour  Intake   1210 ml  Output   1976 ml  Net   -766 ml     Exam: General: No acute respiratory distress Lungs: Coarse but clear to auscultation bilaterally without wheezes or crackles Cardiovascular: Regular rate and rhythm without murmur gallop or rub normal S1 and S2, no JVD and no peripheral edema Abdomen: Nontender, nondistended, soft, bowel sounds positive, no rebound, no ascites, no appreciable mass Genitourinary: Foley catheter in place with light amber urine Musculoskeletal: No significant cyanosis, clubbing of bilateral lower extremities; offloading boots to bilateral lower extremities Neurological: Alert and oriented x3, has very limited gross motor movement of upper and lower extremities as follows: LUE 1+/5, RUE 1-/5, LLE 1/5, RLE 1/5-sensation present in extremities mainly expressed as numbness--unable to move limbs against gravity yet  Data Reviewed: Basic Metabolic Panel:  Lab 02/20/12 1610 02/19/12 0445 02/18/12 0400 02/17/12 0401 02/16/12 0445 02/15/12 0450  NA 138 138 139 145 147* --  K 3.3* 2.9* 3.2* 3.4* 3.8 --  CL 107 105 104 112 111 --  CO2 20 22 24 29 29  --  GLUCOSE 106* 114* 104* 126* 130* --  BUN 18 18 21 22 18  --  CREATININE 0.58 0.48* 0.50 0.65 0.65 --  CALCIUM 7.9* 7.8* 8.0* 7.8* 8.0* --  MG -- -- -- -- 2.2 2.3  PHOS -- -- -- -- 3.2 3.0   Liver Function Tests:  Lab 02/20/12 0450 02/18/12 0400 02/17/12 0401 02/15/12 0450  AST 75* 95* 72* 55*  ALT 91* 83* 59* 60*  ALKPHOS 105 96 93 122*  BILITOT 0.6 0.7 0.4 0.6  PROT 5.8* 5.9* 5.6* 6.2  ALBUMIN 1.7* 1.5* 1.3* 1.4*   CBC:  Lab 02/21/12 0500 02/20/12 0450 02/19/12 0445 02/18/12 0400 02/17/12 0401  WBC 16.2* 17.1* 16.9* 18.8* 15.0*  NEUTROABS -- -- -- --  --  HGB 9.5* 9.5* 9.4* 9.9* 7.0*  HCT 28.3* 27.7* 27.8* 28.8* 22.0*  MCV 89.8 89.9 88.5 89.7 94.0  PLT 633* 608* 602* 582* 601*   BNP (last 3 results)  Basename 02/10/12 0358  PROBNP 707.7*   CBG:  Lab 02/18/12 1150 02/18/12 0805 02/18/12 0403 02/18/12 0020 02/17/12 2007  GLUCAP 111* 106* 105* 96 96    Recent Results (from the past 240 hour(s))  GRAM STAIN     Status: Normal   Collection Time   02/14/12  5:25 PM      Component Value Range Status Comment   Specimen Description ABSCESS NECK LEFT   Final    Special Requests PATIENT ON FOLLOWING VANCOMYCIN   Final    Gram Stain     Final    Value: RARE WBC PRESENT, PREDOMINANTLY PMN     NO ORGANISMS SEEN     Gram Stain Report Called to,Read Back By and Verified With: RN T. HARRELSON 02/14/12 1924 KERAN M.   Report Status 02/14/2012 FINAL   Final   ANAEROBIC CULTURE     Status: Normal   Collection Time   02/14/12  5:25 PM      Component Value Range Status Comment   Specimen Description ABSCESS NECK LEFT   Final    Special Requests PATIENT ON FOLLOWING VANCOMYCIN   Final    Gram Stain     Final    Value: RARE WBC PRESENT, PREDOMINANTLY PMN     NO ORGANISMS SEEN     Gram Stain Report  Called to,Read Back By and Verified With: Gram Stain Report Called to,Read Back By and Verified With: RN T HARRELSON @18 :24 ON 02/14/12 BY Sallyanne Kuster M. Performed at [facility]   Culture NO ANAEROBES ISOLATED   Final    Report Status 02/19/2012 FINAL   Final   CULTURE, ROUTINE-ABSCESS     Status: Normal   Collection Time   02/14/12  5:25 PM      Component Value Range Status Comment   Specimen Description ABSCESS NECK LEFT   Final    Special Requests PATIENT ON FOLLOWING VANCOMYCIN   Final    Gram Stain     Final    Value: RARE WBC PRESENT, PREDOMINANTLY PMN     NO ORGANISMS SEEN     Gram Stain Report Called to,Read Back By and Verified With: Gram Stain Report Called to,Read Back By and Verified With: RN T. HARRELSON @19 :24 ON 02/14/12 BY Sallyanne Kuster M.  Performed at [facility]   Culture     Final    Value: FEW METHICILLIN RESISTANT STAPHYLOCOCCUS AUREUS     Note: RIFAMPIN AND GENTAMICIN SHOULD NOT BE USED AS SINGLE DRUGS FOR TREATMENT OF STAPH INFECTIONS. This organism DOES NOT demonstrate inducible Clindamycin resistance in vitro.   Report Status 02/17/2012 FINAL   Final    Organism ID, Bacteria METHICILLIN RESISTANT STAPHYLOCOCCUS AUREUS   Final      Studies:  Recent x-ray studies have been reviewed in detail by the Attending Physician  Scheduled Meds:  Reviewed in detail by the Attending Physician   Junious Silk, ANP [facility] Office  [phone] Pager [phone]  On-Call/Text Page:      [email or web address]      password [email or web address]  If 7PM-7AM, please contact night-coverage [email or web address] Password [email or web address] 02/21/2012, 1:32 PM   LOS: 13 days    I have examined the patient, reviewed the chart and modified the above note which I agree with.   Kionte Baumgardner,MD [phone] 02/21/2012, 5:26 PM

## 2012-02-21 NOTE — Progress Notes (Signed)
Continuing to make progress in motor function.  Will need inpatient Rehab.

## 2012-02-21 NOTE — Progress Notes (Addendum)
7 Days Post-Op Procedure(s) (LRB): POSTERIOR CERVICAL LAMINECTOMY FOR EPIDURAL ABSCESS (N/A) Subjective:  Frank Brock states he is feeling better.    Objective: Vital signs in last 24 hours: Temp:  [97.8 F (36.6 C)-98.5 F (36.9 C)] 97.8 F (36.6 C) (01/30 0744) Pulse Rate:  [80-87] 85  (01/30 0744) Cardiac Rhythm:  [-] Normal sinus rhythm (01/30 0744) Resp:  [24-30] 25  (01/30 0744) BP: (138-180)/(55-82) 160/80 mmHg (01/30 0744) SpO2:  [90 %-97 %] 97 % (01/30 0851)  Intake/Output from previous day: 01/29 0701 - 01/30 0700 In: 1580 [P.O.:1080; I.V.:250; IV Piggyback:250] Out: 2128 [Urine:2100; Drains:25; Stool:3] Intake/Output this shift: Total I/O In: -  Out: 550 [Urine:550]  General appearance: alert, cooperative and no distress Heart: regular rate and rhythm Lungs: clear to auscultation bilaterally Abdomen: soft, non-tender; bowel sounds normal; no masses,  no organomegaly Wound: wound vac in place  Lab Results:  Basename 02/21/12 0500 02/20/12 0450  WBC 16.2* 17.1*  HGB 9.5* 9.5*  HCT 28.3* 27.7*  PLT 633* 608*   BMET:  Basename 02/20/12 0450 02/19/12 0445  NA 138 138  K 3.3* 2.9*  CL 107 105  CO2 20 22  GLUCOSE 106* 114*  BUN 18 18  CREATININE 0.58 0.48*  CALCIUM 7.9* 7.8*    PT/INR: No results found for this basename: LABPROT,INR in the last 72 hours ABG    Component Value Date/Time   PHART 7.356 02/17/2012 0322   HCO3 27.7* 02/17/2012 0322   TCO2 29.1 02/17/2012 0322   ACIDBASEDEF 1.0 02/10/2012 1945   O2SAT 99.4 02/17/2012 0322   CBG (last 3)   Basename 02/18/12 1150  GLUCAP 111*    Assessment/Plan: S/P Procedure(s) (LRB): POSTERIOR CERVICAL LAMINECTOMY FOR EPIDURAL ABSCESS (N/A)  1. Chest wall  Abscess- plan for wound vac change tomorrow 2. Pulmonary- off oxygen, continue IS 3. Leukocytosis stable, afebrile 4. S/P Post Cervical Laminectomy for epidural abscess- acute paralysis, neurosurgery following 5. Care per medicine   LOS: 13  days    Frank Brock 02/21/2012   I have seen and examined Frank Brock and agree with the above assessment  and plan.  Delight Ovens MD Beeper [phone] Office [phone] 02/21/2012 6:50 PM

## 2012-02-21 NOTE — Clinical Social Work Psychosocial (Signed)
     Clinical Social Work Department BRIEF PSYCHOSOCIAL ASSESSMENT 02/21/2012  Patient:  Frank Brock, Frank Brock     Account Number:  [id number]     Admit date:  02/08/2012  Clinical Social Worker:  Lourdes Sledge  Date/Time:  02/21/2012 11:12 AM  Referred by:  CSW  Date Referred:  02/21/2012 Referred for  SNF Placement   Other Referral:   Interview type:  Patient Other interview type:    PSYCHOSOCIAL DATA Living Status:  PARENTS Admitted from facility:   Level of care:   Primary support name:  Jacobe Study [phone] Primary support relationship to patient:  SPOUSE Degree of support available:   Pt states he has supportive family and friends.    CURRENT CONCERNS Current Concerns  Post-Acute Placement   Other Concerns:    SOCIAL WORK ASSESSMENT / PLAN CSW observed that PT has recommended CIR for pt.    CSW spoke with pt outside of his room as pt requested to sit outside of the room by the window. CSW explored pt living situation and pt interest in CIR or SNF placement. Pt states he would prefer CIR placement as he desires to get stronger, be able to walk and return to work. Pt states he has supportive family and friends who he can stay with if he discharges from CIR. CSW explored whether pt would be agreeable to SNF if CIR is unable to offer placement. Pt states he would prefer CIR or outpatient therapy as he is hopeful he will be able to walk once he leaves the hospital. CSW encouraged pt to consider SNF placement in the instance that pt is not strong enough to discharge home. Pt states he will consider SNf placement and will consent for CSW to do a SNF search.    CSW to do a SNF search in [place].   Assessment/plan status:  Psychosocial Support/Ongoing Assessment of Needs Other assessment/ plan:   Information/referral to community resources:   CSW provided pt with a SNF list and CSW contact information.    PATIENTS/FAMILYS RESPONSE TO PLAN OF CARE: Pt laying on  the recliner outside of room. Pt alert and oriented and very pleasant to speak to. Pt is requesting placement at CIR however will consider SNF placement.

## 2012-02-21 NOTE — Progress Notes (Signed)
Subjective: Patient reports "I feel ok. I've been working hard. My arms are a little more frustrating...not able to do as much as I'd like"  Objective: Vital signs in last 24 hours: Temp:  [97.8 F (36.6 C)-98.5 F (36.9 C)] 97.8 F (36.6 C) (01/30 0744) Pulse Rate:  [80-87] 85  (01/30 0744) Resp:  [24-30] 25  (01/30 0744) BP: (138-180)/(55-82) 160/80 mmHg (01/30 0744) SpO2:  [90 %-97 %] 97 % (01/30 0851)  Intake/Output from previous day: 01/29 0701 - 01/30 0700 In: 1580 [P.O.:1080; I.V.:250; IV Piggyback:250] Out: 2128 [Urine:2100; Drains:25; Stool:3] Intake/Output this shift: Total I/O In: -  Out: 550 [Urine:550]  Alert, conversant. Denies pain. Cervical incision healing well. No erythema, swelling, or drainage. Using soft collar prn. Improving strength all extremities. Now able to lift knees & elbows off pillows.  Lab Results:  Basename 02/21/12 0500 02/20/12 0450  WBC 16.2* 17.1*  HGB 9.5* 9.5*  HCT 28.3* 27.7*  PLT 633* 608*   BMET  Basename 02/20/12 0450 02/19/12 0445  NA 138 138  K 3.3* 2.9*  CL 107 105  CO2 20 22  GLUCOSE 106* 114*  BUN 18 18  CREATININE 0.58 0.48*  CALCIUM 7.9* 7.8*    Studies/Results: Dg Chest Port 1 View  02/20/2012  *RADIOLOGY REPORT*  Clinical Data: Post chest tube removal  PORTABLE CHEST - 1 VIEW  Comparison: 02/19/2012; 02/18/2012; 02/10/2012; chest CT - 02/10/2012  Findings:  Grossly unchanged cardiac silhouette and mediastinal contours. Interval removal of left-sided chest tube and left jugular approach central venous catheter.  No definite pneumothorax.  Grossly unchanged right mid and left upper lung heterogeneous air space opacities. There is grossly unchanged mild diffuse slightly nodular thickening of the pulmonary interstitium.  No new focal airspace opacities. No definite pleural effusion.  Unchanged bones.  IMPRESSION: 1.  Interval removal of support apparatus.  No pneumothorax. 2.  Grossly unchanged right mid and left upper  lobe heterogeneous air space opacities worrisome for multifocal infection.  A follow- up chest radiograph in 4 to 6 weeks after treatment is recommended to ensure resolution.   Original Report Authenticated By: Tacey Ruiz, MD     Assessment/Plan: Improving  LOS: 13 days  Hopeful of CIR placement when medically appropriate.   Georgiann Cocker 02/21/2012, 9:48 AM

## 2012-02-22 LAB — BASIC METABOLIC PANEL
BUN: 9 mg/dL (ref 6–23)
CO2: 21 mEq/L (ref 19–32)
GFR calc non Af Amer: >90 mL/min (ref >90)
Glucose, Bld: 112 mg/dL — ABNORMAL HIGH (ref 70–99)
Potassium: 3.1 mEq/L — ABNORMAL LOW (ref 3.5–5.1)
Sodium: 135 mEq/L (ref 135–145)

## 2012-02-22 MED ORDER — POTASSIUM CHLORIDE CRYS ER 20 MEQ PO TBCR
40.0000 meq | EXTENDED_RELEASE_TABLET | Freq: Once | ORAL | Status: AC
  Administered 2012-02-22: 40 meq via ORAL
  Filled 2012-02-22: qty 2

## 2012-02-22 MED ORDER — SODIUM CHLORIDE 0.9 % IJ SOLN
INTRAMUSCULAR | Status: AC
  Filled 2012-02-22: qty 20

## 2012-02-22 NOTE — Progress Notes (Signed)
Physical Therapy Treatment Patient Details Name: Frank Brock MRN: [id number] DOB: 1960/05/23 Today's Date: 02/22/2012 Time: 0829-0910 PT Time Calculation (min): 41 min  PT Assessment / Plan / Recommendation Comments on Treatment Session  Pt continues to be motivated to get OOB to recliner.  Pt limited this session due to neck pain.  Pt requested pain meds and RN entered room.  Pt left in recliner and educated on the importance of sitting upright and change of position.  Pt needed +2 (A) to complete pericare.  Continue to highly recommend inpatient rehab.    Follow Up Recommendations  CIR;Supervision/Assistance - 24 hour     Equipment Recommendations  Other (comment);Wheelchair cushion (measurements PT);Wheelchair (measurements PT)    Recommendations for Other Services Rehab consult  Frequency Min 4X/week   Plan Discharge plan remains appropriate;Frequency remains appropriate    Precautions / Restrictions Precautions Precautions: Cervical;Fall;Other (comment) (soft collar; premedicatedx)   Pertinent Vitals/Pain C/o neck pain but does not rate; RN entered and pain meds given    Mobility  Bed Mobility Bed Mobility: Rolling Right;Rolling Left Rolling Right: 2: Max assist Rolling Left: 2: Max assist Supine to Sit: 1: +2 Total assist;HOB elevated;With rails Supine to Sit: Patient Percentage: 30% Details for Bed Mobility Assistance: (A) to complete roll however pt able to initiate trunk left and right.  Pt able to initiate bilateral LE OOB with supine > sit.  (A) to elevate trunk OOB. Transfers Transfers:  (used maximove to transfer to recliner for pressure relief) Details for Transfer Assistance: Used maximove to get OOB and educated on the importance of pressure relief. Ambulation/Gait Ambulation/Gait Assistance: Not tested (comment)    Exercises General Exercises - Lower Extremity Hip ABduction/ADduction: AAROM;Strengthening;Both;10 reps;Supine   PT Diagnosis:    PT  Problem List:   PT Treatment Interventions:     PT Goals Acute Rehab PT Goals PT Goal Formulation: With patient Time For Goal Achievement: 03/03/12 Potential to Achieve Goals: Good Pt will Roll Supine to Right Side: with mod assist;with rail PT Goal: Rolling Supine to Right Side - Progress: Progressing toward goal Pt will Roll Supine to Left Side: with mod assist;with rail PT Goal: Rolling Supine to Left Side - Progress: Progressing toward goal Pt will go Supine/Side to Sit: with +2 total assist;with HOB not 0 degrees (comment degree);with rail PT Goal: Supine/Side to Sit - Progress: Progressing toward goal Pt will Sit at [facility] of Bed: with min assist;1-2 min;with bilateral upper extremity support PT Goal: Sit at Edge Of Bed - Progress: Progressing toward goal  Visit Information  Last PT Received On: 02/22/12 Assistance Needed: +2    Subjective Data  Subjective: "Can we go around the block in the chair?" Patient Stated Goal: To be able to walk again   Cognition  Overall Cognitive Status: Appears within functional limits for tasks assessed/performed Arousal/Alertness: Awake/alert Orientation Level: Appears intact for tasks assessed Behavior During Session: [facility] for tasks performed    Balance  Balance Balance Assessed: Yes Static Sitting Balance Static Sitting - Balance Support: Bilateral upper extremity supported;Feet supported Static Sitting - Level of Assistance: 2: Max assist Static Sitting - Comment/# of Minutes: ~ 5 minutes to promote upright posture and prevent right lean.  Pt limited due to neck pain Dynamic Sitting Balance Dynamic Sitting - Balance Support: Right upper extremity supported;Left upper extremity supported;Feet supported Dynamic Sitting - Level of Assistance: 2: Max assist Dynamic Sitting Balance - Compensations: (A) to maintain balance with manual and tactile cues to promote forward/back weight  shift to initiate trunk and lateral lean  End of Session PT -  End of Session Equipment Utilized During Treatment: Other (comment);Cervical collar (maximove, soft collar) Activity Tolerance: Patient limited by pain (pt reports increase neck pain this session ) Patient left: in chair;with call bell/phone within reach;with nursing in room Nurse Communication: Mobility status;Patient requests pain meds   GP     Frank Brock 02/22/2012, 9:23 AM Frank Brock, PT DPT [phone]

## 2012-02-22 NOTE — Progress Notes (Signed)
Patient continuing to improve.  Awaiting Rehab.

## 2012-02-22 NOTE — Progress Notes (Signed)
Subjective: Patient reports "These ladies are taking good care of me"  Objective: Vital signs in last 24 hours: Temp:  [97.8 F (36.6 C)-98 F (36.7 C)] 97.9 F (36.6 C) (01/31 0830) Pulse Rate:  [81-85] 83  (01/31 0800) Resp:  [16-23] 18  (01/31 0800) BP: (150-176)/(64-85) 174/77 mmHg (01/31 0800) SpO2:  [100 %] 100 % (01/31 0818) Weight:  [81.5 kg (179 lb 10.8 oz)] 81.5 kg (179 lb 10.8 oz) (01/31 0447)  Intake/Output from previous day: 01/30 0701 - 01/31 0700 In: -  Out: 4075 [Urine:4050] Intake/Output this shift: Total I/O In: -  Out: 1850 [Urine:1850]  Alert, conversant, smiling. In hallway with PT. No c/o pain at present.  Lab Results:  Basename 02/21/12 0500 02/20/12 0450  WBC 16.2* 17.1*  HGB 9.5* 9.5*  HCT 28.3* 27.7*  PLT 633* 608*   BMET  Basename 02/22/12 0500 02/20/12 0450  NA 135 138  K 3.1* 3.3*  CL 104 107  CO2 21 20  GLUCOSE 112* 106*  BUN 9 18  CREATININE 0.47* 0.58  CALCIUM 8.0* 7.9*    Studies/Results: No results found.  Assessment/Plan: Improving  LOS: 14 days  Continue to mobilize with PT/OT;  CIR when medically appropriate.   Georgiann Cocker 02/22/2012, 9:17 AM

## 2012-02-22 NOTE — Progress Notes (Signed)
Occupational Therapy Treatment Patient Details Name: Frank Brock MRN: [id number] DOB: 11-15-1960 Today's Date: 02/22/2012 Time: 7829-5621 OT Time Calculation (min): 56 min  OT Assessment / Plan / Recommendation Comments on Treatment Session Pt demonstrates incr activity tolerance and progressing with Rt UE participation. Pt remains excellent CIR candidate    Follow Up Recommendations  CIR    Barriers to Discharge       Equipment Recommendations  3 in 1 bedside comode;Tub/shower bench;Hospital bed;Wheelchair (measurements OT);Wheelchair cushion (measurements OT)    Recommendations for Other Services Rehab consult  Frequency Min 3X/week   Plan Discharge plan remains appropriate    Precautions / Restrictions Precautions Precautions: Cervical;Fall Precaution Comments: watch BP Required Braces or Orthoses: Cervical Brace Cervical Brace: Soft collar   Pertinent Vitals/Pain Pain cervical with eob sitting     ADL  Eating/Feeding: Moderate assistance Where Assessed - Eating/Feeding: Chair (provided straw extension and pitcher with lid) Equipment Used:  (hoyer) Transfers/Ambulation Related to ADLs: Pt completed supine <>Sit EOB for core activation ADL Comments: Pt provided peri hygiene supine log roll Rt and lt prior to mobility. pt supine<>Sit EOB. 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 Pt able to activate core postieriorly and left to right lean laterally. Pt attempting anterior pelvic tilt and needing (A). pt reports pain in neck wtih eob task in soft collar. Pt with posterior Rt lob in sitting. Pt with edema in Lt UE >Rt UE. Pt educated on elevation of Lt UE to decr edema and digit flexion/ extension. pt with incr return of Rt UE. pt able to hold pitcher and elbow flexion ~ 45 degrees to drink with extended straw. Pt smiling and grateful for self feeding. Pt fatigued at EOB and hoyer lifted to chair. pt pushed around unit to uplift patients spirits. Pt reports "it feels good to be up  and out of that room"     OT Diagnosis:    OT Problem List:   OT Treatment Interventions:     OT Goals Acute Rehab OT Goals OT Goal Formulation: With patient Time For Goal Achievement: 03/03/12 Potential to Achieve Goals: Good ADL Goals Pt Will Perform Eating: with mod assist;Supported;with assist to don/doff brace/orthosis;with adaptive utensils;with cueing (comment type and amount) ADL Goal: Eating - Progress: Progressing toward goals Pt Will Perform Grooming: with mod assist;Supported;with adaptive equipment;with cueing (comment type and amount) ADL Goal: Grooming - Progress: Progressing toward goals Pt Will Perform Upper Body Bathing: with mod assist;Supine, rolling right and/or left;Supine, head of bed up;Supported Arm Goals Additional Arm Goal #1: Pt will independently instruct caregivers to keep BUE elevated to decrease dependent edema Arm Goal: Additional Goal #1 - Progress: Progressing toward goals Additional Arm Goal #2: Pt will demonstrate hand to mouth pattern with BUE in suported sitting Arm Goal: Additional Goal #2 - Progress: Progressing toward goals Miscellaneous OT Goals Miscellaneous OT Goal #1: Pt will maintain sitting EOB x 10 min with min A OT Goal: Miscellaneous Goal #1 - Progress: Progressing toward goals  Visit Information  Last OT Received On: 02/22/12 Assistance Needed: +2 PT/OT Co-Evaluation/Treatment: Yes    Subjective Data      Prior Functioning       Cognition  Overall Cognitive Status: Appears within functional limits for tasks assessed/performed Arousal/Alertness: Awake/alert Orientation Level: Appears intact for tasks assessed Behavior During Session: [facility] for tasks performed    Mobility  Shoulder Instructions Bed Mobility Bed Mobility: Rolling Right;Rolling Left Rolling Right: 2: Max assist;With rail Rolling Left: 2: Max assist;With rail  Supine to Sit: 1: +2 Total assist;HOB elevated Supine to Sit: Patient Percentage: 30% Sitting -  Scoot to Edge of Bed: 1: +1 Total assist Details for Bed Mobility Assistance: pt demonstrates core activation at EOB sitting.  Transfers Transfers: Not assessed Details for Transfer Assistance: Used maximove to get OOB and educated on the importance of pressure relief.       Exercises  General Exercises - Upper Extremity Shoulder Flexion: AAROM;Right;Left;10 reps Shoulder ABduction: AAROM;Both;10 reps Elbow Flexion: AAROM;Both;10 reps Elbow Extension: AAROM;10 reps;Both Digit Composite Flexion: AAROM;Both;10 reps General Exercises - Lower Extremity Hip ABduction/ADduction: AAROM;Strengthening;Both;10 reps;Supine   Balance Balance Balance Assessed: Yes Static Sitting Balance Static Sitting - Balance Support: Bilateral upper extremity supported;Feet supported Static Sitting - Level of Assistance: 2: Max assist Static Sitting - Comment/# of Minutes: ~ 5 minutes to promote upright posture and prevent right lean. Pt limited due to neck pain Dynamic Sitting Balance Dynamic Sitting - Balance Support: Right upper extremity supported;Left upper extremity supported;Feet supported Dynamic Sitting - Level of Assistance: 2: Max assist Dynamic Sitting Balance - Compensations: (A) to maintain balance with manual and tactile cues to promote forward/back weight shift to initiate trunk and lateral lean   End of Session OT - End of Session Activity Tolerance: Patient tolerated treatment well Patient left: in chair;with call bell/phone within reach Nurse Communication: Mobility status;Precautions;Need for lift equipment  GO     Lucile Shutters 02/22/2012, 12:22 PM Pager: [phone]

## 2012-02-22 NOTE — Progress Notes (Signed)
[facility] Progress Note [place] TEAM 1 - Stepdown/ICU TEAM   LENNARD CAPEK ZOX:[id number] DOB: 1960-02-05 DOA: 02/08/2012 PCP: [name], MD  Brief narrative: 52 year old male patient with tobacco abuse presented to a local urgent care with complaints of left shoulder pain at which time a CXR was unremarkable. Because of persistent back pain went to a chiropractor and repeat CXR ? Mass in upper lung. CT of the chest on 02/04/12 demonstrated left upper lobe soft tissue mass and ?? Metastatic lesions. This was associated with leukocytosis.He was sent out with pain medications. He was subsequently evaluated by hematology/oncology on 02/06/2012 (Dr. [name]) where plans were to proceed with a staging workup including a PET scan as well as MRI of the brain to be done as soon as possible; plans were also to refer the patient to interventional radiology for consideration of CT guided needle aspiration of the left upper lobe lung mass for tissue diagnosis.    Patient returned back to the emergency department on 02/08/2012 with hypoxemic respiratory failure. He was admitted by pulmonary critical care medicine. During the hospitalization his respiratory status worsened and followup x-ray revealed bilateral pulmonary infiltrates. CT of the chest also revealed a large left-sided chest wall abscess that communicated with an intrathoracic pulmonary abscess. Also interval development of rapidly progressive bilateral pulmonary infiltrates c/w infiltrates and septic emboli. Thoracic surgery consultation was obtained and patient was urgently transferred to [facility] for emergency surgery for chest wall abscess presumed staphylococcal etiology. He subsequently underwent incision and drainage of the chest wall abscess and mini-thoracotomy with placement of wound VAC and insertion of left chest tube on 02/10/2012.   Subsequent blood cultures were positive for methicillin-resistant Staphylococcus  aureus. Infectious disease service was consulted. Central line placed at time of admission was removed per ID recommendations. 2-D echocardiogram did not demonstrate evidence of vegetation. He underwent a TEE on 02/14/2012 that revealed no vegetations or atrial thrombus.  On 02/13/2012 patient was found to have a left upper extremity DVT involving the subclavian axillary vein and was started on heparin. This was later stopped after neurological exam revealed patient to be quadriplegic when sedation was weaned off. Neurology initially evaluated the patient. An MRI was ordered that revealed an epidural compressive lesion in the cervical spinal cord with edema. This prompted an urgent neurosurgical consultation. Patient subsequently underwent posterior cervical laminectomy for an epidural abscess at C3-3-C 7 on 02/14/2012. In the past 24 hours patient has regained very weak gross motor activity in the upper and lower extremities.  From a respiratory standpoint he was able to be extubated on 02/17/2012 and is stable on nasal cannula oxygen. He continues on vancomycin and gentamicin for widely disseminated MRSA infections as noted above. Plans are to removehis chest tubes soon. Wound VAC remains in place. PT and OT are continued to work with the patient and when medically stable neurosurgery recommends an inpatient rehabilitation.   Assessment/Plan:  Sepsis -Due to widely disseminated MRSA-hemodynamically stable  Bacteremia due to methicillin resistant Staphylococcus aureus -ID following -Continue Vanco for a total of 6 weeks from date of last I&D i.e. epidural abscess procedure -TEE w/o vegetation/endocarditis  Quadriplegia/Paraspinal epidural abscess -NS following-etiology felt to be due to spinal cord infarction although intra-op cx's were positive for MRSA -PT/OT- soft C-collar -CIR recommended -SLOWLY having increases in motor function -Very deconditioned and is max assist with PT and rapidly  fatigued therefore dc to CIR should be delayed to ensure future success with therapy  Acute respiratory  failure with hypoxia: A) Chest wall abscess/Lung abscess--S/P VATS (1/19) -CVTS following -CT's have been dc'd -Cont. Supportive care  DVT of upper extremity (02/13/12) -Involves left subclavian and axillary veins -Anti-coagulation stopped 2/2 acute quadraplegia from epidural abscess and need for surgery -Anticoag resumed 1/29 with Lovenox- will need to follow clinical progress very closely to assure no acute bleeding complications related to spinal surgery therefore will need to remain in step down for next 48 hours  Volume depletion -Continue low rate IVF's- urine less concentrated- pt has "rigged" a long straw using multiple straws so can drink unassisted!   Hypokalemia -Replete- follow lytes  Hypertension Start amlodipine  Acute hyperglycemia -CBG much better and likely up 2/2 dextrose IVF -HgbA1c pending  Anemia of critical illness -Baseline hgb 13- current is 9.4 -Post 2 units PRBC's this admit  H/O hypercholesteremia  H/O stroke  DVT prophylaxis: lovenox Code Status: Full Family Communication: Patient Disposition Plan: Stepdown   Consultants: Cardiothoracic surgery Infectious disease Neurology Neurosurgery  Procedures: Irrigation and debridement of left chest wall abscess with minithoracotomy, placement of left chest tube and wound VAC (02/10/2012)  Reexploration of left chest debridement site with further wound debridement and placement of wound VAC (02/12/2012)  Posterior cervical laminectomy for epidural abscess C3-C7 (02/14/2012)  TEE (02/14/2012): Study Conclusions - Left ventricle: Systolic function was normal. The estimated ejection fraction was in the range of 60% to 65%. Wall motion was normal; there were no regional wall motion abnormalities. - Left atrium: No evidence of thrombus in the atrial cavity or appendage. Impressions: - No  vegetations.  2-D echocardiogram: (02/11/12): Study Conclusions - Left ventricle: The cavity size was normal. Wall thickness was normal. The estimated ejection fraction was 55%. Images were inadequate for LV wall motion assessment. Features are consistent with a pseudonormal left ventricular filling pattern, with concomitant abnormal relaxation and increased filling pressure (grade 2 diastolic dysfunction). - Aortic valve: Poorly visualized. Probably trileaflet. There was no stenosis. - Mitral valve: No significant regurgitation. - Left atrium: The atrium was mildly dilated. - Right ventricle: The cavity size was normal. Systolic function was normal. - Pulmonary arteries: No complete TR doppler jet so unable to estimate PA systolic pressure. - Inferior vena cava: The vessel was normal in size; the respirophasic diameter changes were in the normal range (= 50%); findings are consistent with normal central venous pressure. Impressions: - Technically difficult study with very poor acoustic windows.Normal LV size with EF 55%. Unable to comment on individual wall segments due to poor images. Normal RV size and systolic function. No significant valvular abnormalities noted.  Left upper extremity venous duplex (02/13/12): Findings consistent with deep vein thrombosis involving the left subclavian vein and left axillary vein.  Microbiology:  MRSA PCR 1/17 >> POSITIVE  Resp 1/17 >> MRSA  Urine 1/17 >> negative  Blood 1/17 >> 2/2 MRSA  Blood 1/18 >> neg  Abscess 1/19 >> MRSA  Antibiotics: Osletamivir 1/17 >> 1/19  Zosyn 1/17 >> 1/20  Vanc 1/17 >>    HPI/Subjective: Patient alert and notes some minor improvements in strength. Very concerned about regaining his strength.   Objective: Blood pressure 174/77, pulse 83, temperature 97.9 F (36.6 C), temperature source Oral, resp. rate 18, height 5\' 10"  (1.778 m), weight 81.5 kg (179 lb 10.8 oz), SpO2 100.00%.  Intake/Output  Summary (Last 24 hours) at 02/22/12 1102 Last data filed at 02/22/12 0830  Gross per 24 hour  Intake      0 ml  Output   5375  ml  Net  -5375 ml     Exam: General: No acute respiratory distress Lungs: Coarse but clear to auscultation bilaterally without wheezes or crackles Cardiovascular: Regular rate and rhythm without murmur gallop or rub normal S1 and S2, no JVD and no peripheral edema Abdomen: Nontender, nondistended, soft, bowel sounds positive, no rebound, no ascites, no appreciable mass Genitourinary: Foley catheter in place with light amber urine Musculoskeletal: No significant cyanosis, clubbing of bilateral lower extremities; offloading boots to bilateral lower extremities Neurological: Alert and oriented x3, has very limited gross motor movement of upper and lower extremities as follows: LUE 1/5, RUE 1+/5 with improved control noted, LLE 1+/5, RLE 1+/5-sensation present in extremities mainly expressed as numbness--able to pull back legs using quadriceps but unable to lift leg off bed  Data Reviewed: Basic Metabolic Panel:  Lab 02/22/12 1610 02/20/12 0450 02/19/12 0445 02/18/12 0400 02/17/12 0401 02/16/12 0445  NA 135 138 138 139 145 --  K 3.1* 3.3* 2.9* 3.2* 3.4* --  CL 104 107 105 104 112 --  CO2 21 20 22 24 29  --  GLUCOSE 112* 106* 114* 104* 126* --  BUN 9 18 18 21 22  --  CREATININE 0.47* 0.58 0.48* 0.50 0.65 --  CALCIUM 8.0* 7.9* 7.8* 8.0* 7.8* --  MG -- -- -- -- -- 2.2  PHOS -- -- -- -- -- 3.2   Liver Function Tests:  Lab 02/20/12 0450 02/18/12 0400 02/17/12 0401  AST 75* 95* 72*  ALT 91* 83* 59*  ALKPHOS 105 96 93  BILITOT 0.6 0.7 0.4  PROT 5.8* 5.9* 5.6*  ALBUMIN 1.7* 1.5* 1.3*   CBC:  Lab 02/21/12 0500 02/20/12 0450 02/19/12 0445 02/18/12 0400 02/17/12 0401  WBC 16.2* 17.1* 16.9* 18.8* 15.0*  NEUTROABS -- -- -- -- --  HGB 9.5* 9.5* 9.4* 9.9* 7.0*  HCT 28.3* 27.7* 27.8* 28.8* 22.0*  MCV 89.8 89.9 88.5 89.7 94.0  PLT 633* 608* 602* 582* 601*   BNP  (last 3 results)  Basename 02/10/12 0358  PROBNP 707.7*   CBG:  Lab 02/18/12 1150 02/18/12 0805 02/18/12 0403 02/18/12 0020 02/17/12 2007  GLUCAP 111* 106* 105* 96 96    Recent Results (from the past 240 hour(s))  GRAM STAIN     Status: Normal   Collection Time   02/14/12  5:25 PM      Component Value Range Status Comment   Specimen Description ABSCESS NECK LEFT   Final    Special Requests PATIENT ON FOLLOWING VANCOMYCIN   Final    Gram Stain     Final    Value: RARE WBC PRESENT, PREDOMINANTLY PMN     NO ORGANISMS SEEN     Gram Stain Report Called to,Read Back By and Verified With: RN T. HARRELSON 02/14/12 1924 KERAN M.   Report Status 02/14/2012 FINAL   Final   ANAEROBIC CULTURE     Status: Normal   Collection Time   02/14/12  5:25 PM      Component Value Range Status Comment   Specimen Description ABSCESS NECK LEFT   Final    Special Requests PATIENT ON FOLLOWING VANCOMYCIN   Final    Gram Stain     Final    Value: RARE WBC PRESENT, PREDOMINANTLY PMN     NO ORGANISMS SEEN     Gram Stain Report Called to,Read Back By and Verified With: Gram Stain Report Called to,Read Back By and Verified With: RN T HARRELSON @18 :24 ON 02/14/12 BY Sallyanne Kuster M. Performed  at [facility]   Culture NO ANAEROBES ISOLATED   Final    Report Status 02/19/2012 FINAL   Final   CULTURE, ROUTINE-ABSCESS     Status: Normal   Collection Time   02/14/12  5:25 PM      Component Value Range Status Comment   Specimen Description ABSCESS NECK LEFT   Final    Special Requests PATIENT ON FOLLOWING VANCOMYCIN   Final    Gram Stain     Final    Value: RARE WBC PRESENT, PREDOMINANTLY PMN     NO ORGANISMS SEEN     Gram Stain Report Called to,Read Back By and Verified With: Gram Stain Report Called to,Read Back By and Verified With: RN T. HARRELSON @19 :24 ON 02/14/12 BY Sallyanne Kuster M. Performed at [facility]   Culture     Final    Value: FEW METHICILLIN RESISTANT STAPHYLOCOCCUS AUREUS     Note: RIFAMPIN AND  GENTAMICIN SHOULD NOT BE USED AS SINGLE DRUGS FOR TREATMENT OF STAPH INFECTIONS. This organism DOES NOT demonstrate inducible Clindamycin resistance in vitro.   Report Status 02/17/2012 FINAL   Final    Organism ID, Bacteria METHICILLIN RESISTANT STAPHYLOCOCCUS AUREUS   Final      Studies:  Recent x-ray studies have been reviewed in detail by the Attending Physician  Scheduled Meds:  Reviewed in detail by the Attending Physician   Junious Silk, ANP [facility] Office  [phone] Pager [phone]  On-Call/Text Page:      [email or web address]      password [email or web address]  If 7PM-7AM, please contact night-coverage [email or web address] Password [email or web address] 02/22/2012, 11:02 AM   LOS: 14 days    I have examined the patient, reviewed the chart and modified the above note which I agree with.   Kallie Depolo,MD [phone] 02/22/2012, 4:24 PM

## 2012-02-22 NOTE — Progress Notes (Addendum)
8 Days Post-Op Procedure(s) (LRB): POSTERIOR CERVICAL LAMINECTOMY FOR EPIDURAL ABSCESS (N/A) Subjective: Patient up in chair this morning, wound vac changed today  Objective: Vital signs in last 24 hours: Temp:  [97.8 F (36.6 C)-98 F (36.7 C)] 97.9 F (36.6 C) (01/31 0830) Pulse Rate:  [81-85] 83  (01/31 0800) Cardiac Rhythm:  [-] Normal sinus rhythm (01/31 0800) Resp:  [16-23] 18  (01/31 0800) BP: (150-176)/(64-85) 174/77 mmHg (01/31 0800) SpO2:  [100 %] 100 % (01/31 0818) Weight:  [179 lb 10.8 oz (81.5 kg)] 179 lb 10.8 oz (81.5 kg) (01/31 0447)  Intake/Output from previous day: 01/30 0701 - 01/31 0700 In: -  Out: 4075 [Urine:4050] Intake/Output this shift: Total I/O In: -  Out: 1850 [Urine:1850]  General appearance: alert, cooperative and no distress Heart: regular rate and rhythm Lungs: clear to auscultation bilaterally Wound: wound vac in place  Lab Results:  Basename 02/21/12 0500 02/20/12 0450  WBC 16.2* 17.1*  HGB 9.5* 9.5*  HCT 28.3* 27.7*  PLT 633* 608*   BMET:  Basename 02/22/12 0500 02/20/12 0450  NA 135 138  K 3.1* 3.3*  CL 104 107  CO2 21 20  GLUCOSE 112* 106*  BUN 9 18  CREATININE 0.47* 0.58  CALCIUM 8.0* 7.9*    PT/INR: No results found for this basename: LABPROT,INR in the last 72 hours ABG    Component Value Date/Time   PHART 7.356 02/17/2012 0322   HCO3 27.7* 02/17/2012 0322   TCO2 29.1 02/17/2012 0322   ACIDBASEDEF 1.0 02/10/2012 1945   O2SAT 99.4 02/17/2012 0322   CBG (last 3)  No results found for this basename: GLUCAP:3 in the last 72 hours  Assessment/Plan: S/P Procedure(s) (LRB): POSTERIOR CERVICAL LAMINECTOMY FOR EPIDURAL ABSCESS (N/A)  1.Chest wall Abscess- wound vac changed today, per not drainage still present, wound doing well, + granulation tissue 2. Pulmonary- continue IS 3. S/P Cervical Laminectomy for epidural abscess- acute paralysis, patient slowly regaining feeling in LE 4. CV- HTN this morning on Norvasc 5. Care  per medicine   LOS: 14 days    BARRETT, Denny Peon 02/22/2012   Wound improving I have seen and examined Frank Brock and agree with the above assessment  and plan.  Delight Ovens MD Beeper [phone] Office [phone] 02/22/2012 10:35 AM

## 2012-02-22 NOTE — Consult Note (Signed)
WOC follow up  Wound type:surgical s/p debridement  Wound bed: beefy red, early granulation Drainage (amount, consistency, odor) minimal in canister, serosanguinous  Periwound:intact Dressing procedure/placement/frequency: 1pc of white foam packed down into tunnel at o'clock, then covered with 1pc black foam, drape and seal obtained . Pt premedicated PO prior to dressing change.  WOC will follow along with you for assistance with VAC dressings as needed.  Yessenia Maillet [place] RN,CWOCN [phone]

## 2012-02-23 DIAGNOSIS — E876 Hypokalemia: Secondary | ICD-10-CM

## 2012-02-23 DIAGNOSIS — M8618 Other acute osteomyelitis, other site: Secondary | ICD-10-CM

## 2012-02-23 LAB — CBC
HCT: 26.9 % — ABNORMAL LOW (ref 39.0–52.0)
Hemoglobin: 9 g/dL — ABNORMAL LOW (ref 13.0–17.0)
MCH: 30.3 pg (ref 26.0–34.0)
MCHC: 33.5 g/dL (ref 30.0–36.0)
MCV: 90.6 fL (ref 78.0–100.0)
RDW: 14.5 % (ref 11.5–15.5)

## 2012-02-23 LAB — BASIC METABOLIC PANEL
BUN: 11 mg/dL (ref 6–23)
Chloride: 104 mEq/L (ref 96–112)
Creatinine, Ser: 0.56 mg/dL (ref 0.50–1.35)
GFR calc Af Amer: >90 mL/min (ref >90)
Glucose, Bld: 118 mg/dL — ABNORMAL HIGH (ref 70–99)
Potassium: 3.4 mEq/L — ABNORMAL LOW (ref 3.5–5.1)

## 2012-02-23 MED ORDER — IPRATROPIUM BROMIDE 0.02 % IN SOLN
0.5000 mg | Freq: Three times a day (TID) | RESPIRATORY_TRACT | Status: DC
  Administered 2012-02-24 – 2012-02-25 (×5): 0.5 mg via RESPIRATORY_TRACT
  Filled 2012-02-23 (×5): qty 2.5

## 2012-02-23 MED ORDER — ALBUTEROL SULFATE (5 MG/ML) 0.5% IN NEBU
2.5000 mg | INHALATION_SOLUTION | Freq: Three times a day (TID) | RESPIRATORY_TRACT | Status: DC
  Administered 2012-02-24 – 2012-02-25 (×5): 2.5 mg via RESPIRATORY_TRACT
  Filled 2012-02-23 (×5): qty 0.5

## 2012-02-23 MED ORDER — IPRATROPIUM BROMIDE 0.02 % IN SOLN: 0.5000 mg | Freq: Once | RESPIRATORY_TRACT | Status: DC

## 2012-02-23 MED ORDER — SODIUM CHLORIDE 0.9 % IJ SOLN
INTRAMUSCULAR | Status: AC
  Filled 2012-02-23: qty 30

## 2012-02-23 MED ORDER — POTASSIUM CHLORIDE CRYS ER 20 MEQ PO TBCR
40.0000 meq | EXTENDED_RELEASE_TABLET | Freq: Two times a day (BID) | ORAL | Status: DC
  Administered 2012-02-23 – 2012-02-25 (×4): 40 meq via ORAL
  Filled 2012-02-23 (×5): qty 2

## 2012-02-23 MED ORDER — MAGIC MOUTHWASH
5.0000 mL | Freq: Four times a day (QID) | ORAL | Status: DC
  Administered 2012-02-23 – 2012-02-25 (×9): 5 mL via ORAL
  Filled 2012-02-23 (×12): qty 5

## 2012-02-23 NOTE — Progress Notes (Signed)
ANTIBIOTIC CONSULT NOTE - FOLLOW UP  Pharmacy Consult:  Vancomycin/lovenox Indication:  MRSA infections/DVT  No Known Allergies  Patient Measurements: Height: 5\' 10"  (177.8 cm) Weight: 179 lb 10.8 oz (81.5 kg) IBW/kg (Calculated) : 73   Vital Signs: Temp: 98.9 F (37.2 C) (02/01 0753) Temp src: Oral (02/01 0753) BP: 137/69 mmHg (02/01 0412) Pulse Rate: 85  (02/01 0412) Intake/Output from previous day: 01/31 0701 - 02/01 0700 In: 520 [I.V.:20; IV Piggyback:500] Out: 4550 [Urine:4450; Drains:100] Intake/Output from this shift:    Labs:  Basename 02/23/12 0500 02/22/12 0500 02/21/12 0500  WBC 10.8* -- 16.2*  HGB 9.0* -- 9.5*  PLT 587* -- 633*  LABCREA -- -- --  CREATININE 0.56 0.47* --   Estimated Creatinine Clearance: 114.1 ml/min (by C-G formula based on Cr of 0.56). No results found for this basename: VANCOTROUGH:2,VANCOPEAK:2,VANCORANDOM:2,GENTTROUGH:2,GENTPEAK:2,GENTRANDOM:2,TOBRATROUGH:2,TOBRAPEAK:2,TOBRARND:2,AMIKACINPEAK:2,AMIKACINTROU:2,AMIKACIN:2, in the last 72 hours   Microbiology: Recent Results (from the past 720 hour(s))  TECHNOLOGIST REVIEW     Status: Normal   Collection Time   02/06/12  9:42 AM      Component Value Range Status Comment   Technologist Review Occ Metas and Myelocytes present   Final   CULTURE, BLOOD (ROUTINE X 2)     Status: Normal   Collection Time   02/08/12  3:39 PM      Component Value Range Status Comment   Specimen Description BLOOD RIGHT ANTECUBITAL   Final    Special Requests BOTTLES DRAWN AEROBIC AND ANAEROBIC 5CC   Final    Culture  Setup Time 02/08/2012 23:18   Final    Culture     Final    Value: METHICILLIN RESISTANT STAPHYLOCOCCUS AUREUS     Note: RIFAMPIN AND GENTAMICIN SHOULD NOT BE USED AS SINGLE DRUGS FOR TREATMENT OF STAPH INFECTIONS. This organism DOES NOT demonstrate inducible Clindamycin resistance in vitro. CRITICAL RESULT CALLED TO, READ BACK BY AND VERIFIED WITH: MANDY ROLLS      02/10/12 @ 9:03PM BY RUSCA.   Note: Gram Stain Report Called to,Read Back By and Verified With: MEELY RICHARDSON 02/09/12 @ 2:26PM BY RUSCA.   Report Status 02/11/2012 FINAL   Final    Organism ID, Bacteria METHICILLIN RESISTANT STAPHYLOCOCCUS AUREUS   Final   CULTURE, BLOOD (ROUTINE X 2)     Status: Normal   Collection Time   02/08/12  3:49 PM      Component Value Range Status Comment   Specimen Description BLOOD LEFT HAND   Final    Special Requests BOTTLES DRAWN AEROBIC ONLY 2CC   Final    Culture  Setup Time 02/08/2012 23:19   Final    Culture     Final    Value: STAPHYLOCOCCUS AUREUS     Note: SUSCEPTIBILITIES PERFORMED ON PREVIOUS CULTURE WITHIN THE LAST 5 DAYS. CRITICAL RESULT CALLED TO, READ BACK BY AND VERIFIED WITH: MANDY ROLLS 02/10/12 @ 9:03PM BY RUSCA.     Note: Gram Stain Report Called to,Read Back By and Verified With: MEELY RICHARDSON 02/09/12 @ 2:26PM BY RUSCA.   Report Status 02/11/2012 FINAL   Final   URINE CULTURE     Status: Normal   Collection Time   02/08/12  4:58 PM      Component Value Range Status Comment   Specimen Description URINE, CATHETERIZED   Final    Special Requests NONE   Final    Culture  Setup Time 02/09/2012 01:48   Final    Colony Count NO GROWTH   Final  Culture NO GROWTH   Final    Report Status 02/10/2012 FINAL   Final   CULTURE, EXPECTORATED SPUTUM-ASSESSMENT     Status: Normal   Collection Time   02/08/12  5:54 PM      Component Value Range Status Comment   Specimen Description SPUTUM   Final    Special Requests NONE   Final    Sputum evaluation     Final    Value: THIS SPECIMEN IS ACCEPTABLE. RESPIRATORY CULTURE REPORT TO FOLLOW.   Report Status 02/08/2012 FINAL   Final   MRSA PCR SCREENING     Status: Abnormal   Collection Time   02/08/12  5:54 PM      Component Value Range Status Comment   MRSA by PCR POSITIVE (*) NEGATIVE Final   CULTURE, RESPIRATORY     Status: Normal   Collection Time   02/08/12  5:54 PM      Component Value Range Status Comment   Specimen  Description SPUTUM   Final    Special Requests NONE   Final    Gram Stain     Final    Value: ABUNDANT WBC PRESENT,BOTH PMN AND MONONUCLEAR     RARE SQUAMOUS EPITHELIAL CELLS PRESENT     ABUNDANT GRAM POSITIVE COCCI     IN PAIRS IN CLUSTERS   Culture     Final    Value: ABUNDANT METHICILLIN RESISTANT STAPHYLOCOCCUS AUREUS     Note: RIFAMPIN AND GENTAMICIN SHOULD NOT BE USED AS SINGLE DRUGS FOR TREATMENT OF STAPH INFECTIONS. This organism DOES NOT demonstrate inducible Clindamycin resistance in vitro.   Report Status 02/12/2012 FINAL   Final    Organism ID, Bacteria METHICILLIN RESISTANT STAPHYLOCOCCUS AUREUS   Final   CULTURE, BLOOD (ROUTINE X 2)     Status: Normal   Collection Time   02/09/12  7:50 PM      Component Value Range Status Comment   Specimen Description Blood   Final    Special Requests NONE   Final    Culture  Setup Time 02/10/2012 02:13   Final    Culture NO GROWTH 5 DAYS   Final    Report Status 02/16/2012 FINAL   Final   CULTURE, BLOOD (ROUTINE X 2)     Status: Normal   Collection Time   02/09/12  7:55 PM      Component Value Range Status Comment   Specimen Description Blood   Final    Special Requests NONE   Final    Culture  Setup Time 02/10/2012 02:13   Final    Culture NO GROWTH 5 DAYS   Final    Report Status 02/16/2012 FINAL   Final   ANAEROBIC CULTURE     Status: Normal   Collection Time   02/10/12  4:32 PM      Component Value Range Status Comment   Specimen Description ABSCESS LEFT CHEST   Final    Special Requests PATIENT ON FOLLOWING ANCEF VANC   Final    Gram Stain     Final    Value: ABUNDANT WBC PRESENT, PREDOMINANTLY PMN     NO SQUAMOUS EPITHELIAL CELLS SEEN     ABUNDANT GRAM POSITIVE COCCI     IN CLUSTERS   Culture NO ANAEROBES ISOLATED   Final    Report Status 02/15/2012 FINAL   Final   GRAM STAIN     Status: Normal   Collection Time   02/10/12  4:32 PM  Component Value Range Status Comment   Specimen Description ABSCESS LEFT CHEST    Final    Special Requests PATIENT ON FOLLOWING ANCEF VANC   Final    Gram Stain     Final    Value: ABUNDANT WBC PRESENT, PREDOMINANTLY PMN     ABUNDANT GRAM POSITIVE COCCI IN CLUSTERS     Gram Stain Report Called to,Read Back By and Verified With: P.WEATHERLY,RN 02/10/12 1725 EHOWARD   Report Status 02/10/2012 FINAL   Final   CULTURE, ROUTINE-ABSCESS     Status: Normal   Collection Time   02/10/12  4:34 PM      Component Value Range Status Comment   Specimen Description ABSCESS LEFT CHEST   Final    Special Requests PATIENT ON FOLLOWING VANC ANCEF   Final    Gram Stain     Final    Value: ABUNDANT WBC PRESENT, PREDOMINANTLY PMN     NO SQUAMOUS EPITHELIAL CELLS SEEN     ABUNDANT GRAM POSITIVE COCCI IN CLUSTERS     Gram Stain Report Called to,Read Back By and Verified With: Gram Stain Report Called to,Read Back By and Verified With: P WEATHERLY RN 02/10/12 1725 BY ZOXWRUE Performed at [facility]   Culture     Final    Value: ABUNDANT METHICILLIN RESISTANT STAPHYLOCOCCUS AUREUS     Note: RIFAMPIN AND GENTAMICIN SHOULD NOT BE USED AS SINGLE DRUGS FOR TREATMENT OF STAPH INFECTIONS. This organism DOES NOT demonstrate inducible Clindamycin resistance in vitro. CRITICAL RESULT CALLED TO, READ BACK BY AND VERIFIED WITH: SUSAN F 1/22 @      820 BY REAMM   Report Status 02/13/2012 FINAL   Final    Organism ID, Bacteria METHICILLIN RESISTANT STAPHYLOCOCCUS AUREUS   Final   GRAM STAIN     Status: Normal   Collection Time   02/14/12  5:25 PM      Component Value Range Status Comment   Specimen Description ABSCESS NECK LEFT   Final    Special Requests PATIENT ON FOLLOWING VANCOMYCIN   Final    Gram Stain     Final    Value: RARE WBC PRESENT, PREDOMINANTLY PMN     NO ORGANISMS SEEN     Gram Stain Report Called to,Read Back By and Verified With: RN T. HARRELSON 02/14/12 1924 KERAN M.   Report Status 02/14/2012 FINAL   Final   ANAEROBIC CULTURE     Status: Normal   Collection Time   02/14/12   5:25 PM      Component Value Range Status Comment   Specimen Description ABSCESS NECK LEFT   Final    Special Requests PATIENT ON FOLLOWING VANCOMYCIN   Final    Gram Stain     Final    Value: RARE WBC PRESENT, PREDOMINANTLY PMN     NO ORGANISMS SEEN     Gram Stain Report Called to,Read Back By and Verified With: Gram Stain Report Called to,Read Back By and Verified With: RN T HARRELSON @18 :24 ON 02/14/12 BY Sallyanne Kuster M. Performed at [facility]   Culture NO ANAEROBES ISOLATED   Final    Report Status 02/19/2012 FINAL   Final   CULTURE, ROUTINE-ABSCESS     Status: Normal   Collection Time   02/14/12  5:25 PM      Component Value Range Status Comment   Specimen Description ABSCESS NECK LEFT   Final    Special Requests PATIENT ON FOLLOWING VANCOMYCIN   Final  Gram Stain     Final    Value: RARE WBC PRESENT, PREDOMINANTLY PMN     NO ORGANISMS SEEN     Gram Stain Report Called to,Read Back By and Verified With: Gram Stain Report Called to,Read Back By and Verified With: RN T. HARRELSON @19 :24 ON 02/14/12 BY Sallyanne Kuster M. Performed at [facility]   Culture     Final    Value: FEW METHICILLIN RESISTANT STAPHYLOCOCCUS AUREUS     Note: RIFAMPIN AND GENTAMICIN SHOULD NOT BE USED AS SINGLE DRUGS FOR TREATMENT OF STAPH INFECTIONS. This organism DOES NOT demonstrate inducible Clindamycin resistance in vitro.   Report Status 02/17/2012 FINAL   Final    Organism ID, Bacteria METHICILLIN RESISTANT STAPHYLOCOCCUS AUREUS   Final        Assessment: 75 YOM with recently discovered LUL/chest wall mass, admitted 02/08/12 with severe chest wall pain and hypoxemia, suspected to have PNA.  S/p I&D of chest wall abscess, drainage, and thoracotomy.  Blood, respiratory, and abscess cultures grew MRSA - repeat blood cultures negative.  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  His renal function has been stable and vancomycin trough remains therapeutic.  S/p laminectomy for quadriplegia. Also on  lovenox for DVT. H/H have remained stable.    MRSA PCR 1/17 >> POS Resp 1/17 >> MRSA  Blood 1/17 >> MRSA  1/18 bcx x2>> negative 1/19 L chest abscess>> abundant MRSA  1/23 L neck abscess>> few MRSA  1/21 VT = 13.5 (on 1gm q8h) 1/24 VT = 17.4 on 1250 q8h 1/27 VT = 17.7 on 1250mg  q8h  Tamiflu 1/17 >> 1/19 Zosyn 1/17 >> 1/20 Vanc 1/17 >> (plan d/c 2/6) Gent 1/24 >> 1/27 (trough was therapeutic at 0.6)   Goal of Therapy:  Vancomycin trough level 15-20 mcg/ml   Plan:  - Continue Vanc 1250mg  IV Q8H - Monitor renal fxn, clinical course, weekly vanc trough - Lovenox 80mg  SQ bid

## 2012-02-23 NOTE — Progress Notes (Addendum)
[facility] Progress Note [place] TEAM 1 - Stepdown/ICU TEAM   Frank Brock ZOX:[id number] DOB: 05-16-1960 DOA: 02/08/2012 PCP: [name], MD  Brief narrative: 52 year old male patient with tobacco abuse presented to a local urgent care with complaints of left shoulder pain at which time a CXR was unremarkable. Because of persistent back pain went to a chiropractor and repeat CXR ? Mass in upper lung. CT of the chest on 02/04/12 demonstrated left upper lobe soft tissue mass and ?? Metastatic lesions. This was associated with leukocytosis.He was sent out with pain medications. He was subsequently evaluated by hematology/oncology on 02/06/2012 (Dr. [name]) where plans were to proceed with a staging workup including a PET scan as well as MRI of the brain to be done as soon as possible; plans were also to refer the patient to interventional radiology for consideration of CT guided needle aspiration of the left upper lobe lung mass for tissue diagnosis.    Patient returned back to the emergency department on 02/08/2012 with hypoxemic respiratory failure. He was admitted by pulmonary critical care medicine. During the hospitalization his respiratory status worsened and followup x-ray revealed bilateral pulmonary infiltrates. CT of the chest also revealed a large left-sided chest wall abscess that communicated with an intrathoracic pulmonary abscess. Also interval development of rapidly progressive bilateral pulmonary infiltrates c/w infiltrates and septic emboli. Thoracic surgery consultation was obtained and patient was urgently transferred to [facility] for emergency surgery for chest wall abscess presumed staphylococcal etiology. He subsequently underwent incision and drainage of the chest wall abscess and mini-thoracotomy with placement of wound VAC and insertion of left chest tube on 02/10/2012.   Subsequent blood cultures were positive for methicillin-resistant Staphylococcus  aureus. Infectious disease service was consulted. Central line placed at time of admission was removed per ID recommendations. 2-D echocardiogram did not demonstrate evidence of vegetation. He underwent a TEE on 02/14/2012 that revealed no vegetations or atrial thrombus.  On 02/13/2012 patient was found to have a left upper extremity DVT involving the subclavian axillary vein and was started on heparin. This was later stopped after neurological exam revealed patient to be quadriplegic when sedation was weaned off. Neurology initially evaluated the patient. An MRI was ordered that revealed an epidural compressive lesion in the cervical spinal cord with edema. This prompted an urgent neurosurgical consultation. Patient subsequently underwent posterior cervical laminectomy for an epidural abscess at C3-3-C 7 on 02/14/2012. In the past 24 hours patient has regained very weak gross motor activity in the upper and lower extremities.  From a respiratory standpoint he was able to be extubated on 02/17/2012 and is stable on nasal cannula oxygen. He continues on vancomycin and gentamicin for widely disseminated MRSA infections as noted above. Plans are to removehis chest tubes soon. Wound VAC remains in place. PT and OT are continued to work with the patient and when medically stable neurosurgery recommends an inpatient rehabilitation.   Assessment/Plan:  Sepsis -Due to widely disseminated MRSA-hemodynamically stable  Bacteremia due to methicillin resistant Staphylococcus aureus -WBC count improving -ID has made recommendations -Continue Vanco for a total of 6 weeks from date of last I&D i.e. epidural abscess procedure -TEE w/o vegetation/endocarditis  Quadriplegia/Paraspinal epidural abscess -NS following-etiology felt to be due to spinal cord infarction although intra-op cx's were positive for MRSA -PT/OT- soft C-collar -CIR recommended -SLOWLY having increases in motor function each day -Very  deconditioned and is max assist with PT and rapidly fatigued therefore dc to CIR should be delayed to ensure  future success with therapy  Acute respiratory failure with hypoxia: Chest wall abscess/Lung abscess--S/P VATS (1/19) -CVTS following -CT's have been dc'd -Cont. Supportive care  DVT of upper extremity (02/13/12) -Involves left subclavian and axillary veins -Anti-coagulation stopped 2/2 acute quadraplegia from epidural abscess and need for surgery -Anticoag resumed 1/29 with Lovenox-   Volume depletion -Patient is drinking much more now and admits to actually feeling thirsty. -Will discontinue IV fluids today.  Hypokalemia -Replete- follow lytes  Hypertension Start amlodipine  Acute hyperglycemia -HgbA1c 5.8-DC'd Accu-Checks  Anemia of critical illness -Baseline hgb 13- current is 9.4 -Post 2 units PRBC's this admit  H/O hypercholesteremia  H/O stroke  DVT prophylaxis: lovenox Code Status: Full Family Communication: Patient Disposition Plan: Stepdown   Consultants: Cardiothoracic surgery Infectious disease Neurology Neurosurgery  Procedures: Irrigation and debridement of left chest wall abscess with minithoracotomy, placement of left chest tube and wound VAC (02/10/2012)  Reexploration of left chest debridement site with further wound debridement and placement of wound VAC (02/12/2012)  Posterior cervical laminectomy for epidural abscess C3-C7 (02/14/2012)  TEE (02/14/2012): Study Conclusions - Left ventricle: Systolic function was normal. The estimated ejection fraction was in the range of 60% to 65%. Wall motion was normal; there were no regional wall motion abnormalities. - Left atrium: No evidence of thrombus in the atrial cavity or appendage. Impressions: - No vegetations.  2-D echocardiogram: (02/11/12): Study Conclusions - Left ventricle: The cavity size was normal. Wall thickness was normal. The estimated ejection fraction was 55%. Images  were inadequate for LV wall motion assessment. Features are consistent with a pseudonormal left ventricular filling pattern, with concomitant abnormal relaxation and increased filling pressure (grade 2 diastolic dysfunction). - Aortic valve: Poorly visualized. Probably trileaflet. There was no stenosis. - Mitral valve: No significant regurgitation. - Left atrium: The atrium was mildly dilated. - Right ventricle: The cavity size was normal. Systolic function was normal. - Pulmonary arteries: No complete TR doppler jet so unable to estimate PA systolic pressure. - Inferior vena cava: The vessel was normal in size; the respirophasic diameter changes were in the normal range (= 50%); findings are consistent with normal central venous pressure. Impressions: - Technically difficult study with very poor acoustic windows.Normal LV size with EF 55%. Unable to comment on individual wall segments due to poor images. Normal RV size and systolic function. No significant valvular abnormalities noted.  Left upper extremity venous duplex (02/13/12): Findings consistent with deep vein thrombosis involving the left subclavian vein and left axillary vein.  Microbiology:  MRSA PCR 1/17 >> POSITIVE  Resp 1/17 >> MRSA  Urine 1/17 >> negative  Blood 1/17 >> 2/2 MRSA  Blood 1/18 >> neg  Abscess 1/19 >> MRSA  Antibiotics: Osletamivir 1/17 >> 1/19  Zosyn 1/17 >> 1/20  Vanc 1/17 >>    HPI/Subjective: Patient alert and has many requests-has asked me to assist him with water twice well in the room, states he is extremely thirsty and is drinking well now. Also requesting that the therapist coming to see him today. Complains of having a coating on his tongue which is painful. Tolerating a regular diet well.   Objective: Blood pressure 136/64, pulse 85, temperature 97.7 F (36.5 C), temperature source Oral, resp. rate 17, height 5\' 10"  (1.778 m), weight 81.5 kg (179 lb 10.8 oz), SpO2  99.00%.  Intake/Output Summary (Last 24 hours) at 02/23/12 1706 Last data filed at 02/23/12 1627  Gross per 24 hour  Intake   2630 ml  Output  5175 ml  Net  -2545 ml     Exam: General: No acute respiratory distress Lungs: Coarse but clear to auscultation bilaterally without wheezes or crackles Cardiovascular: Regular rate and rhythm without murmur gallop or rub normal S1 and S2, no JVD and no peripheral edema Abdomen: Nontender, nondistended, soft, bowel sounds positive, no rebound, no ascites, no appreciable mass Genitourinary: Foley catheter in place with light amber urine Musculoskeletal: No significant cyanosis, clubbing of bilateral lower extremities; offloading boots to bilateral lower extremities Neurological: Alert and oriented x3, has very limited gross motor movement of upper and lower extremities as follows:  LUE 2/5, RUE 2/5 in forearem- not much movement at shoulder LLE 1+/5, RLE 1+/5-sensation present in extremities mainly expressed as numbness--able to pull back legs using quadriceps but unable to lift leg off bed  Data Reviewed: Basic Metabolic Panel:  Lab 02/23/12 1610 02/22/12 0500 02/20/12 0450 02/19/12 0445 02/18/12 0400  NA 134* 135 138 138 139  K 3.4* 3.1* 3.3* 2.9* 3.2*  CL 104 104 107 105 104  CO2 22 21 20 22 24   GLUCOSE 118* 112* 106* 114* 104*  BUN 11 9 18 18 21   CREATININE 0.56 0.47* 0.58 0.48* 0.50  CALCIUM 7.9* 8.0* 7.9* 7.8* 8.0*  MG -- -- -- -- --  PHOS -- -- -- -- --   Liver Function Tests:  Lab 02/20/12 0450 02/18/12 0400 02/17/12 0401  AST 75* 95* 72*  ALT 91* 83* 59*  ALKPHOS 105 96 93  BILITOT 0.6 0.7 0.4  PROT 5.8* 5.9* 5.6*  ALBUMIN 1.7* 1.5* 1.3*   CBC:  Lab 02/23/12 0500 02/21/12 0500 02/20/12 0450 02/19/12 0445 02/18/12 0400  WBC 10.8* 16.2* 17.1* 16.9* 18.8*  NEUTROABS -- -- -- -- --  HGB 9.0* 9.5* 9.5* 9.4* 9.9*  HCT 26.9* 28.3* 27.7* 27.8* 28.8*  MCV 90.6 89.8 89.9 88.5 89.7  PLT 587* 633* 608* 602* 582*   BNP (last  3 results)  Basename 02/10/12 0358  PROBNP 707.7*   CBG:  Lab 02/18/12 1150 02/18/12 0805 02/18/12 0403 02/18/12 0020 02/17/12 2007  GLUCAP 111* 106* 105* 96 96    Recent Results (from the past 240 hour(s))  GRAM STAIN     Status: Normal   Collection Time   02/14/12  5:25 PM      Component Value Range Status Comment   Specimen Description ABSCESS NECK LEFT   Final    Special Requests PATIENT ON FOLLOWING VANCOMYCIN   Final    Gram Stain     Final    Value: RARE WBC PRESENT, PREDOMINANTLY PMN     NO ORGANISMS SEEN     Gram Stain Report Called to,Read Back By and Verified With: RN T. HARRELSON 02/14/12 1924 KERAN M.   Report Status 02/14/2012 FINAL   Final   ANAEROBIC CULTURE     Status: Normal   Collection Time   02/14/12  5:25 PM      Component Value Range Status Comment   Specimen Description ABSCESS NECK LEFT   Final    Special Requests PATIENT ON FOLLOWING VANCOMYCIN   Final    Gram Stain     Final    Value: RARE WBC PRESENT, PREDOMINANTLY PMN     NO ORGANISMS SEEN     Gram Stain Report Called to,Read Back By and Verified With: Gram Stain Report Called to,Read Back By and Verified With: RN T HARRELSON @18 :24 ON 02/14/12 BY Sallyanne Kuster M. Performed at [facility]   Culture  NO ANAEROBES ISOLATED   Final    Report Status 02/19/2012 FINAL   Final   CULTURE, ROUTINE-ABSCESS     Status: Normal   Collection Time   02/14/12  5:25 PM      Component Value Range Status Comment   Specimen Description ABSCESS NECK LEFT   Final    Special Requests PATIENT ON FOLLOWING VANCOMYCIN   Final    Gram Stain     Final    Value: RARE WBC PRESENT, PREDOMINANTLY PMN     NO ORGANISMS SEEN     Gram Stain Report Called to,Read Back By and Verified With: Gram Stain Report Called to,Read Back By and Verified With: RN T. HARRELSON @19 :24 ON 02/14/12 BY Sallyanne Kuster M. Performed at [facility]   Culture     Final    Value: FEW METHICILLIN RESISTANT STAPHYLOCOCCUS AUREUS     Note: RIFAMPIN AND  GENTAMICIN SHOULD NOT BE USED AS SINGLE DRUGS FOR TREATMENT OF STAPH INFECTIONS. This organism DOES NOT demonstrate inducible Clindamycin resistance in vitro.   Report Status 02/17/2012 FINAL   Final    Organism ID, Bacteria METHICILLIN RESISTANT STAPHYLOCOCCUS AUREUS   Final      Studies:  Recent x-ray studies have been reviewed in detail by the Attending Physician  Scheduled Meds:  Reviewed in detail by the Attending Physician   Calvert Cantor, MD [phone]  If 7PM-7AM, please contact night-coverage [email or web address] Password [email or web address] 02/23/2012, 5:06 PM   LOS: 15 days

## 2012-02-23 NOTE — Progress Notes (Signed)
Subjective: Patient reports No complaints no significant changes  Objective: Vital signs in last 24 hours: Temp:  [97.9 F (36.6 C)-98.9 F (37.2 C)] 98.4 F (36.9 C) (02/01 1100) Pulse Rate:  [25-92] 85  (02/01 1100) Resp:  [17-25] 17  (02/01 1100) BP: (114-146)/(53-69) 136/64 mmHg (02/01 1100) SpO2:  [97 %-100 %] 99 % (02/01 1409)  Intake/Output from previous day: 01/31 0701 - 02/01 0700 In: 880 [P.O.:360; I.V.:20; IV Piggyback:500] Out: 4550 [Urine:4450; Drains:100] Intake/Output this shift: Total I/O In: 150 [I.V.:150] Out: 2575 [Urine:2575]  No significant change is  Lab Results:  Basename 02/23/12 0500 02/21/12 0500  WBC 10.8* 16.2*  HGB 9.0* 9.5*  HCT 26.9* 28.3*  PLT 587* 633*   BMET  Basename 02/23/12 0500 02/22/12 0500  NA 134* 135  K 3.4* 3.1*  CL 104 104  CO2 22 21  GLUCOSE 118* 112*  BUN 11 9  CREATININE 0.56 0.47*  CALCIUM 7.9* 8.0*    Studies/Results: No results found.  Assessment/Plan: Stable  LOS: 15 days  Continue supportive care   Frank Brock J 02/23/2012, 2:53 PM

## 2012-02-23 NOTE — Progress Notes (Signed)
Clinical Social Work  CSW met with patient at bedside to give SNF bed offers. Patient reports he is optimistic that [facility] will accept him. CSW explained he could review SNF offers to have alternative option. CSW will continue to follow.  [place], [place] [phone] (Weekend Coverage)

## 2012-02-23 NOTE — Progress Notes (Addendum)
                    [address]            [place] [id number]          [phone]     9 Days Post-Op Procedure(s) (LRB): POSTERIOR CERVICAL LAMINECTOMY FOR EPIDURAL ABSCESS (N/A)  Subjective: No complaints this am.   Objective: Vital signs in last 24 hours: Patient Vitals for the past 24 hrs:  BP Temp Temp src Pulse Resp SpO2  02/23/12 0753 - 98.9 F (37.2 C) Oral - - 100 %  02/23/12 0412 137/69 mmHg 98.2 F (36.8 C) Oral 85  17  100 %  02/23/12 0311 - - - - - 100 %  02/22/12 2336 126/63 mmHg 97.9 F (36.6 C) Oral - 17  100 %  02/22/12 2225 - - - - 25  -  02/22/12 2144 - - - - - 97 %  02/22/12 2035 146/66 mmHg 98.8 F (37.1 C) Oral 25  17  100 %  02/22/12 1716 - - - - 21  -  02/22/12 1608 139/68 mmHg 98.5 F (36.9 C) Oral - - -  02/22/12 1537 - - - - - 100 %  02/22/12 1140 - 98.7 F (37.1 C) Oral - - -  02/22/12 1130 138/68 mmHg - - 85  17  100 %  02/22/12 0919 163/89 mmHg - - 174  15  100 %  02/22/12 0830 - 97.9 F (36.6 C) Oral - - -  02/22/12 0818 - - - - - 100 %   Current Weight  02/22/12 179 lb 10.8 oz (81.5 kg)     Intake/Output from previous day: 01/31 0701 - 02/01 0700 In: 520 [I.V.:20; IV Piggyback:500] Out: 4550 [Urine:4450; Drains:100]    PHYSICAL EXAM:  Heart: RRR Lungs: Few exp wheezes after breathing tx Wound: Left chest VAC in place, no surrounding erythema     Lab Results: CBC: Basename 02/23/12 0500 02/21/12 0500  WBC 10.8* 16.2*  HGB 9.0* 9.5*  HCT 26.9* 28.3*  PLT 587* 633*   BMET:  Basename 02/23/12 0500 02/22/12 0500  NA 134* 135  K 3.4* 3.1*  CL 104 104  CO2 22 21  GLUCOSE 118* 112*  BUN 11 9  CREATININE 0.56 0.47*  CALCIUM 7.9* 8.0*    PT/INR: No results found for this basename: LABPROT,INR in the last 72 hours    Assessment/Plan: S/P Procedure(s) (LRB): POSTERIOR CERVICAL LAMINECTOMY FOR EPIDURAL ABSCESS (N/A) Wound stable, for VAC change Monday. CV- episode bradycardia earlier this am,  IM aware.  Will monitor. Not on beta blocker. Hypokalemia- replace K+. MRSA- Continue IV Vanc. Continue other care per IM, neurosurgery.   LOS: 15 days    COLLINS,GINA H 02/23/2012    Chart reviewed, patient examined, agree with above.

## 2012-02-23 NOTE — Progress Notes (Signed)
Pt. brady'd down to 26; came back up to 90's within 3 seconds.  Pt. Also brady'd earlier during the night to 36, NP Lynch made aware.  No new orders at this time.  Will continue to monitor.  Vivi Martens RN

## 2012-02-24 LAB — CBC
MCH: 30.2 pg (ref 26.0–34.0)
MCHC: 33.3 g/dL (ref 30.0–36.0)
MCV: 90.6 fL (ref 78.0–100.0)
Platelets: 615 10*3/uL — ABNORMAL HIGH (ref 150–400)
RDW: 14.5 % (ref 11.5–15.5)

## 2012-02-24 LAB — BASIC METABOLIC PANEL
CO2: 22 mEq/L (ref 19–32)
Calcium: 8 mg/dL — ABNORMAL LOW (ref 8.4–10.5)
Creatinine, Ser: 0.49 mg/dL — ABNORMAL LOW (ref 0.50–1.35)
GFR calc Af Amer: >90 mL/min (ref >90)
GFR calc non Af Amer: >90 mL/min (ref >90)
Sodium: 135 mEq/L (ref 135–145)

## 2012-02-24 MED ORDER — ZOLPIDEM TARTRATE 5 MG PO TABS: 5.0000 mg | ORAL_TABLET | Freq: Every evening | ORAL | Status: DC | PRN

## 2012-02-24 MED ORDER — SODIUM CHLORIDE 0.9 % IJ SOLN
INTRAMUSCULAR | Status: AC
  Administered 2012-02-24: 04:00:00
  Filled 2012-02-24: qty 10

## 2012-02-24 MED ORDER — TRAMADOL HCL 50 MG PO TABS
100.0000 mg | ORAL_TABLET | Freq: Four times a day (QID) | ORAL | Status: DC | PRN
  Administered 2012-02-24 – 2012-02-25 (×4): 100 mg via ORAL
  Filled 2012-02-24 (×4): qty 2

## 2012-02-24 MED ORDER — POTASSIUM CHLORIDE CRYS ER 20 MEQ PO TBCR: 40.0000 meq | EXTENDED_RELEASE_TABLET | Freq: Every day | ORAL | Status: DC

## 2012-02-24 MED ORDER — DIPHENHYDRAMINE HCL 50 MG PO CAPS
50.0000 mg | ORAL_CAPSULE | Freq: Every day | ORAL | Status: DC
  Administered 2012-02-24: 50 mg via ORAL
  Filled 2012-02-24 (×2): qty 1

## 2012-02-24 NOTE — Progress Notes (Addendum)
                    [address]            [place] [address]          [phone]     10 Days Post-Op Procedure(s) (LRB): POSTERIOR CERVICAL LAMINECTOMY FOR EPIDURAL ABSCESS (N/A)  Subjective: Having a lot more discomfort today in his neck and shoulders.  Not sleeping at night. Breathing stable.   Objective: Vital signs in last 24 hours: Patient Vitals for the past 24 hrs:  BP Temp Temp src Pulse Resp SpO2 Weight  02/24/12 0700 - 97.4 F (36.3 C) Oral - - - -  02/24/12 0619 - - - 73  23  100 % -  02/24/12 0400 - - - - 17  100 % 169 lb 5 oz (76.8 kg)  02/24/12 0349 172/87 mmHg 98.4 F (36.9 C) Oral 83  18  100 % -  02/23/12 2332 140/86 mmHg 98.4 F (36.9 C) Oral 85  20  100 % -  02/23/12 2240 - - - - 19  - -  02/23/12 2043 - - - - - 100 % -  02/23/12 1945 157/65 mmHg 98.5 F (36.9 C) Oral 88  22  100 % -  02/23/12 1625 - 97.7 F (36.5 C) - - - - -  02/23/12 1409 - - - - - 99 % -  02/23/12 1100 136/64 mmHg 98.4 F (36.9 C) Oral 85  17  100 % -  02/23/12 1000 114/53 mmHg - - 92  21  100 % -   Current Weight  02/24/12 169 lb 5 oz (76.8 kg)     Intake/Output from previous day: 02/01 0701 - 02/02 0700 In: 2610 [P.O.:1360; I.V.:500; IV Piggyback:750] Out: 7575 [Urine:7550; Drains:25]    PHYSICAL EXAM:  Heart: RRR Lungs: scattered wheezes bilaterally Wound: VAC in place, surrounding area without erythema   Lab Results: CBC: Basename 02/24/12 0402 02/23/12 0500  WBC 9.6 10.8*  HGB 9.3* 9.0*  HCT 27.9* 26.9*  PLT 615* 587*   BMET:  Basename 02/24/12 0402 02/23/12 0500  NA 135 134*  K 3.4* 3.4*  CL 105 104  CO2 22 22  GLUCOSE 115* 118*  BUN 9 11  CREATININE 0.49* 0.56  CALCIUM 8.0* 7.9*    PT/INR: No results found for this basename: LABPROT,INR in the last 72 hours    Assessment/Plan: S/P Procedure(s) (LRB): POSTERIOR CERVICAL LAMINECTOMY FOR EPIDURAL ABSCESS (N/A) Stable, for VAC change in am. Hypokalemia- replace K+. MRSA-  IV Vanc. Medical issues per IM, neurosurgery.   LOS: 16 days    COLLINS,GINA H 02/24/2012    Chart reviewed, patient examined, agree with above.

## 2012-02-24 NOTE — Progress Notes (Signed)
[facility] Progress Note [place] TEAM 1 - Stepdown/ICU TEAM   Frank Brock ZOX:[id number] DOB: 03/04/1960 DOA: 02/08/2012 PCP: [name], MD  Brief narrative: 52 year old male patient with tobacco abuse presented to a local urgent care with complaints of left shoulder pain at which time a CXR was unremarkable. Because of persistent back pain went to a chiropractor and repeat CXR ? Mass in upper lung. CT of the chest on 02/04/12 demonstrated left upper lobe soft tissue mass and ?? Metastatic lesions. This was associated with leukocytosis.He was sent out with pain medications. He was subsequently evaluated by hematology/oncology on 02/06/2012 (Dr. [name]) where plans were to proceed with a staging workup including a PET scan as well as MRI of the brain to be done as soon as possible; plans were also to refer the patient to interventional radiology for consideration of CT guided needle aspiration of the left upper lobe lung mass for tissue diagnosis.    Patient returned back to the emergency department on 02/08/2012 with hypoxemic respiratory failure. He was admitted by pulmonary critical care medicine. During the hospitalization his respiratory status worsened and followup x-ray revealed bilateral pulmonary infiltrates. CT of the chest also revealed a large left-sided chest wall abscess that communicated with an intrathoracic pulmonary abscess. Also interval development of rapidly progressive bilateral pulmonary infiltrates c/w infiltrates and septic emboli. Thoracic surgery consultation was obtained and patient was urgently transferred to [facility] for emergency surgery for chest wall abscess presumed staphylococcal etiology. He subsequently underwent incision and drainage of the chest wall abscess and mini-thoracotomy with placement of wound VAC and insertion of left chest tube on 02/10/2012.   Subsequent blood cultures were positive for methicillin-resistant Staphylococcus  aureus. Infectious disease service was consulted. Central line placed at time of admission was removed per ID recommendations. 2-D echocardiogram did not demonstrate evidence of vegetation. He underwent a TEE on 02/14/2012 that revealed no vegetations or atrial thrombus.  On 02/13/2012 patient was found to have a left upper extremity DVT involving the subclavian axillary vein and was started on heparin. This was later stopped after neurological exam revealed patient to be quadriplegic when sedation was weaned off. Neurology initially evaluated the patient. An MRI was ordered that revealed an epidural compressive lesion in the cervical spinal cord with edema. This prompted an urgent neurosurgical consultation. Patient subsequently underwent posterior cervical laminectomy for an epidural abscess at C3-3-C 7 on 02/14/2012. In the past 24 hours patient has regained very weak gross motor activity in the upper and lower extremities.  From a respiratory standpoint he was able to be extubated on 02/17/2012 and is stable on nasal cannula oxygen. He continues on vancomycin and gentamicin for widely disseminated MRSA infections as noted above. Plans are to removehis chest tubes soon. Wound VAC remains in place. PT and OT are continued to work with the patient and when medically stable neurosurgery recommends an inpatient rehabilitation.   Assessment/Plan:  Sepsis -Due to widely disseminated MRSA-hemodynamically stable  Bacteremia due to methicillin resistant Staphylococcus aureus -WBC count improving -ID has made recommendations -Continue Vanco for a total of 6 weeks from date of last I&D i.e. epidural abscess procedure -TEE w/o vegetation/endocarditis  Quadriplegia/Paraspinal epidural abscess -NS following-etiology felt to be due to spinal cord infarction although intra-op cx's were positive for MRSA -PT/OT- soft C-collar -CIR recommended -SLOWLY having increases in motor function each day -Very  deconditioned and is max assist with PT and rapidly fatigued therefore dc to CIR should be delayed to ensure  future success with therapy  Acute respiratory failure with hypoxia: Chest wall abscess/Lung abscess--S/P VATS (1/19) -CVTS following -CT's have been dc'd -Cont. Supportive care  DVT of upper extremity (02/13/12) -Involves left subclavian and axillary veins -Anti-coagulation stopped 2/2 acute quadraplegia from epidural abscess and need for surgery -Anticoag resumed 1/29 with Lovenox-   Volume depletion -Patient is drinking much more now and admits to actually feeling thirsty. -Will discontinue IV fluids today.  Hypokalemia -Replete- follow lytes  Hypertension Start amlodipine  Acute hyperglycemia -HgbA1c 5.8-DC'd Accu-Checks  Anemia of critical illness -Baseline hgb 13- current is 9.4 -Post 2 units PRBC's this admit  H/O hypercholesteremia  H/O stroke  DVT prophylaxis: lovenox Code Status: Full Family Communication: Patient Disposition Plan: Stepdown - should be able to be discharged to [facility] tomorrow  Consultants: Cardiothoracic surgery Infectious disease Neurology Neurosurgery  Procedures: Irrigation and debridement of left chest wall abscess with minithoracotomy, placement of left chest tube and wound VAC (02/10/2012)  Reexploration of left chest debridement site with further wound debridement and placement of wound VAC (02/12/2012)  Posterior cervical laminectomy for epidural abscess C3-C7 (02/14/2012)  TEE (02/14/2012): Study Conclusions - Left ventricle: Systolic function was normal. The estimated ejection fraction was in the range of 60% to 65%. Wall motion was normal; there were no regional wall motion abnormalities. - Left atrium: No evidence of thrombus in the atrial cavity or appendage. Impressions: - No vegetations.  2-D echocardiogram: (02/11/12): Study Conclusions - Left ventricle: The cavity size was normal. Wall thickness was normal.  The estimated ejection fraction was 55%. Images were inadequate for LV wall motion assessment. Features are consistent with a pseudonormal left ventricular filling pattern, with concomitant abnormal relaxation and increased filling pressure (grade 2 diastolic dysfunction). - Aortic valve: Poorly visualized. Probably trileaflet. There was no stenosis. - Mitral valve: No significant regurgitation. - Left atrium: The atrium was mildly dilated. - Right ventricle: The cavity size was normal. Systolic function was normal. - Pulmonary arteries: No complete TR doppler jet so unable to estimate PA systolic pressure. - Inferior vena cava: The vessel was normal in size; the respirophasic diameter changes were in the normal range (= 50%); findings are consistent with normal central venous pressure. Impressions: - Technically difficult study with very poor acoustic windows.Normal LV size with EF 55%. Unable to comment on individual wall segments due to poor images. Normal RV size and systolic function. No significant valvular abnormalities noted.  Left upper extremity venous duplex (02/13/12): Findings consistent with deep vein thrombosis involving the left subclavian vein and left axillary vein.  Microbiology:  MRSA PCR 1/17 >> POSITIVE  Resp 1/17 >> MRSA  Urine 1/17 >> negative  Blood 1/17 >> 2/2 MRSA  Blood 1/18 >> neg  Abscess 1/19 >> MRSA  Antibiotics: Osletamivir 1/17 >> 1/19  Zosyn 1/17 >> 1/20  Vanc 1/17 >>    HPI/Subjective: Patient alert. Continues to tolerate diet well and drinking water well. Noted to have increased movement with upper turbinates.   Objective: Blood pressure 151/66, pulse 73, temperature 97.4 F (36.3 C), temperature source Oral, resp. rate 24, height 5\' 10"  (1.778 m), weight 76.8 kg (169 lb 5 oz), SpO2 100.00%.  Intake/Output Summary (Last 24 hours) at 02/24/12 1331 Last data filed at 02/24/12 1249  Gross per 24 hour  Intake   2210 ml  Output    7925 ml  Net  -5715 ml     Exam: General: No acute respiratory distress Lungs: Coarse but clear to auscultation bilaterally without wheezes  or crackles Cardiovascular: Regular rate and rhythm without murmur gallop or rub normal S1 and S2, no JVD and no peripheral edema Abdomen: Nontender, nondistended, soft, bowel sounds positive, no rebound, no ascites, no appreciable mass Genitourinary: Foley catheter in place with light amber urine Musculoskeletal: No significant cyanosis, clubbing of bilateral lower extremities; offloading boots to bilateral lower extremities Neurological: Alert and oriented x3, has very limited gross motor movement of upper and lower extremities as follows:  LUE 2/5, RUE 2/5 in forearem- not much movement at shoulder LLE 1+/5, RLE 1+/5-sensation present in extremities mainly expressed as numbness--able to pull back legs using quadriceps but unable to lift leg off bed  Data Reviewed: Basic Metabolic Panel:  Lab 02/24/12 1914 02/23/12 0500 02/22/12 0500 02/20/12 0450 02/19/12 0445  NA 135 134* 135 138 138  K 3.4* 3.4* 3.1* 3.3* 2.9*  CL 105 104 104 107 105  CO2 22 22 21 20 22   GLUCOSE 115* 118* 112* 106* 114*  BUN 9 11 9 18 18   CREATININE 0.49* 0.56 0.47* 0.58 0.48*  CALCIUM 8.0* 7.9* 8.0* 7.9* 7.8*  MG 1.8 -- -- -- --  PHOS -- -- -- -- --   Liver Function Tests:  Lab 02/20/12 0450 02/18/12 0400  AST 75* 95*  ALT 91* 83*  ALKPHOS 105 96  BILITOT 0.6 0.7  PROT 5.8* 5.9*  ALBUMIN 1.7* 1.5*   CBC:  Lab 02/24/12 0402 02/23/12 0500 02/21/12 0500 02/20/12 0450 02/19/12 0445  WBC 9.6 10.8* 16.2* 17.1* 16.9*  NEUTROABS -- -- -- -- --  HGB 9.3* 9.0* 9.5* 9.5* 9.4*  HCT 27.9* 26.9* 28.3* 27.7* 27.8*  MCV 90.6 90.6 89.8 89.9 88.5  PLT 615* 587* 633* 608* 602*   BNP (last 3 results)  Basename 02/10/12 0358  PROBNP 707.7*   CBG:  Lab 02/18/12 1150 02/18/12 0805 02/18/12 0403 02/18/12 0020 02/17/12 2007  GLUCAP 111* 106* 105* 96 96    Recent Results  (from the past 240 hour(s))  GRAM STAIN     Status: Normal   Collection Time   02/14/12  5:25 PM      Component Value Range Status Comment   Specimen Description ABSCESS NECK LEFT   Final    Special Requests PATIENT ON FOLLOWING VANCOMYCIN   Final    Gram Stain     Final    Value: RARE WBC PRESENT, PREDOMINANTLY PMN     NO ORGANISMS SEEN     Gram Stain Report Called to,Read Back By and Verified With: RN T. HARRELSON 02/14/12 1924 KERAN M.   Report Status 02/14/2012 FINAL   Final   ANAEROBIC CULTURE     Status: Normal   Collection Time   02/14/12  5:25 PM      Component Value Range Status Comment   Specimen Description ABSCESS NECK LEFT   Final    Special Requests PATIENT ON FOLLOWING VANCOMYCIN   Final    Gram Stain     Final    Value: RARE WBC PRESENT, PREDOMINANTLY PMN     NO ORGANISMS SEEN     Gram Stain Report Called to,Read Back By and Verified With: Gram Stain Report Called to,Read Back By and Verified With: RN T HARRELSON @18 :24 ON 02/14/12 BY Sallyanne Kuster M. Performed at [facility]   Culture NO ANAEROBES ISOLATED   Final    Report Status 02/19/2012 FINAL   Final   CULTURE, ROUTINE-ABSCESS     Status: Normal   Collection Time   02/14/12  5:25 PM      Component Value Range Status Comment   Specimen Description ABSCESS NECK LEFT   Final    Special Requests PATIENT ON FOLLOWING VANCOMYCIN   Final    Gram Stain     Final    Value: RARE WBC PRESENT, PREDOMINANTLY PMN     NO ORGANISMS SEEN     Gram Stain Report Called to,Read Back By and Verified With: Gram Stain Report Called to,Read Back By and Verified With: RN T. HARRELSON @19 :24 ON 02/14/12 BY Sallyanne Kuster M. Performed at [facility]   Culture     Final    Value: FEW METHICILLIN RESISTANT STAPHYLOCOCCUS AUREUS     Note: RIFAMPIN AND GENTAMICIN SHOULD NOT BE USED AS SINGLE DRUGS FOR TREATMENT OF STAPH INFECTIONS. This organism DOES NOT demonstrate inducible Clindamycin resistance in vitro.   Report Status 02/17/2012 FINAL    Final    Organism ID, Bacteria METHICILLIN RESISTANT STAPHYLOCOCCUS AUREUS   Final      Studies:  Recent x-ray studies have been reviewed in detail by the Attending Physician  Scheduled Meds:  Reviewed in detail by the Attending Physician   Frank Cantor, MD [phone]  If 7PM-7AM, please contact night-coverage [email or web address] Password [email or web address] 02/24/2012, 1:31 PM   LOS: 16 days

## 2012-02-25 ENCOUNTER — Inpatient Hospital Stay (HOSPITAL_COMMUNITY)
Admission: RE | Admit: 2012-02-25 | Discharge: 2012-03-21 | DRG: 945 | Disposition: A | Discharge status: Home Health | Payer: Medicare HMO | Source: Intra-hospital | Attending: Physical Medicine & Rehabilitation | Admitting: Physical Medicine & Rehabilitation

## 2012-02-25 ENCOUNTER — Inpatient Hospital Stay (HOSPITAL_COMMUNITY): Payer: Medicare HMO

## 2012-02-25 DIAGNOSIS — M4620 Osteomyelitis of vertebra, site unspecified: Secondary | ICD-10-CM | POA: Diagnosis present

## 2012-02-25 DIAGNOSIS — N319 Neuromuscular dysfunction of bladder, unspecified: Secondary | ICD-10-CM | POA: Diagnosis present

## 2012-02-25 DIAGNOSIS — L02213 Cutaneous abscess of chest wall: Secondary | ICD-10-CM | POA: Diagnosis present

## 2012-02-25 DIAGNOSIS — E7800 Pure hypercholesterolemia, unspecified: Secondary | ICD-10-CM | POA: Diagnosis present

## 2012-02-25 DIAGNOSIS — K592 Neurogenic bowel, not elsewhere classified: Secondary | ICD-10-CM | POA: Diagnosis present

## 2012-02-25 DIAGNOSIS — I498 Other specified cardiac arrhythmias: Secondary | ICD-10-CM | POA: Diagnosis present

## 2012-02-25 DIAGNOSIS — L02219 Cutaneous abscess of trunk, unspecified: Secondary | ICD-10-CM | POA: Diagnosis present

## 2012-02-25 DIAGNOSIS — R7989 Other specified abnormal findings of blood chemistry: Secondary | ICD-10-CM | POA: Diagnosis not present

## 2012-02-25 DIAGNOSIS — G061 Intraspinal abscess and granuloma: Secondary | ICD-10-CM | POA: Diagnosis present

## 2012-02-25 DIAGNOSIS — E785 Hyperlipidemia, unspecified: Secondary | ICD-10-CM | POA: Diagnosis present

## 2012-02-25 DIAGNOSIS — M4712 Other spondylosis with myelopathy, cervical region: Secondary | ICD-10-CM | POA: Diagnosis present

## 2012-02-25 DIAGNOSIS — R339 Retention of urine, unspecified: Secondary | ICD-10-CM | POA: Diagnosis present

## 2012-02-25 DIAGNOSIS — I10 Essential (primary) hypertension: Secondary | ICD-10-CM | POA: Diagnosis present

## 2012-02-25 DIAGNOSIS — Z8673 Personal history of transient ischemic attack (TIA), and cerebral infarction without residual deficits: Secondary | ICD-10-CM

## 2012-02-25 DIAGNOSIS — D649 Anemia, unspecified: Secondary | ICD-10-CM | POA: Diagnosis present

## 2012-02-25 DIAGNOSIS — G4700 Insomnia, unspecified: Secondary | ICD-10-CM | POA: Diagnosis present

## 2012-02-25 DIAGNOSIS — I82B19 Acute embolism and thrombosis of unspecified subclavian vein: Secondary | ICD-10-CM | POA: Diagnosis present

## 2012-02-25 DIAGNOSIS — E871 Hypo-osmolality and hyponatremia: Secondary | ICD-10-CM | POA: Diagnosis present

## 2012-02-25 DIAGNOSIS — G8250 Quadriplegia, unspecified: Secondary | ICD-10-CM | POA: Diagnosis present

## 2012-02-25 DIAGNOSIS — A419 Sepsis, unspecified organism: Secondary | ICD-10-CM | POA: Diagnosis present

## 2012-02-25 DIAGNOSIS — Z5189 Encounter for other specified aftercare: Principal | ICD-10-CM

## 2012-02-25 DIAGNOSIS — R7881 Bacteremia: Secondary | ICD-10-CM | POA: Diagnosis present

## 2012-02-25 DIAGNOSIS — Z9889 Other specified postprocedural states: Secondary | ICD-10-CM

## 2012-02-25 DIAGNOSIS — I82629 Acute embolism and thrombosis of deep veins of unspecified upper extremity: Secondary | ICD-10-CM | POA: Diagnosis present

## 2012-02-25 DIAGNOSIS — D72829 Elevated white blood cell count, unspecified: Secondary | ICD-10-CM | POA: Diagnosis present

## 2012-02-25 DIAGNOSIS — J9600 Acute respiratory failure, unspecified whether with hypoxia or hypercapnia: Secondary | ICD-10-CM

## 2012-02-25 LAB — BASIC METABOLIC PANEL
BUN: 10 mg/dL (ref 6–23)
CO2: 23 mEq/L (ref 19–32)
Chloride: 102 mEq/L (ref 96–112)
Creatinine, Ser: 0.53 mg/dL (ref 0.50–1.35)
GFR calc Af Amer: >90 mL/min (ref >90)

## 2012-02-25 MED ORDER — ONDANSETRON HCL 4 MG/2ML IJ SOLN: 4.0000 mg | INTRAMUSCULAR | Status: DC | PRN

## 2012-02-25 MED ORDER — ALBUTEROL SULFATE (5 MG/ML) 0.5% IN NEBU
2.5000 mg | INHALATION_SOLUTION | Freq: Three times a day (TID) | RESPIRATORY_TRACT | Status: DC
  Administered 2012-02-25 – 2012-02-28 (×8): 2.5 mg via RESPIRATORY_TRACT
  Filled 2012-02-25 (×10): qty 0.5

## 2012-02-25 MED ORDER — VANCOMYCIN HCL 10 G IV SOLR
1250.0000 mg | Freq: Two times a day (BID) | INTRAVENOUS | Status: DC
  Administered 2012-02-25 – 2012-03-19 (×45): 1250 mg via INTRAVENOUS
  Filled 2012-02-25 (×52): qty 1250

## 2012-02-25 MED ORDER — ENOXAPARIN SODIUM 80 MG/0.8ML ~~LOC~~ SOLN: 80.0000 mg | Freq: Two times a day (BID) | SUBCUTANEOUS | Status: DC

## 2012-02-25 MED ORDER — IPRATROPIUM BROMIDE 0.02 % IN SOLN: 0.5000 mg | Freq: Once | RESPIRATORY_TRACT | Status: DC

## 2012-02-25 MED ORDER — AMLODIPINE BESYLATE 10 MG PO TABS: 10.0000 mg | ORAL_TABLET | Freq: Every day | ORAL | Status: DC

## 2012-02-25 MED ORDER — WHITE PETROLATUM GEL: Status: DC | PRN

## 2012-02-25 MED ORDER — BENEPROTEIN PO POWD
1.0000 | Freq: Three times a day (TID) | ORAL | Status: DC
  Administered 2012-02-26 – 2012-03-17 (×57): 6 g via ORAL
  Filled 2012-02-25 (×3): qty 227

## 2012-02-25 MED ORDER — IPRATROPIUM BROMIDE 0.02 % IN SOLN: 0.5000 mg | Freq: Three times a day (TID) | RESPIRATORY_TRACT | Status: DC

## 2012-02-25 MED ORDER — BIOTENE DRY MOUTH MT LIQD: 1.0000 "application " | Freq: Four times a day (QID) | OROMUCOSAL | Status: DC

## 2012-02-25 MED ORDER — ALBUTEROL SULFATE (5 MG/ML) 0.5% IN NEBU: 2.5000 mg | INHALATION_SOLUTION | Freq: Three times a day (TID) | RESPIRATORY_TRACT | Status: DC

## 2012-02-25 MED ORDER — VANCOMYCIN HCL 10 G IV SOLR
1250.0000 mg | Freq: Two times a day (BID) | INTRAVENOUS | Status: DC
  Filled 2012-02-25: qty 1250

## 2012-02-25 MED ORDER — ALUM & MAG HYDROXIDE-SIMETH 200-200-20 MG/5ML PO SUSP
30.0000 mL | ORAL | Status: DC | PRN
  Administered 2012-02-27 – 2012-03-11 (×12): 30 mL via ORAL
  Filled 2012-02-25 (×14): qty 30

## 2012-02-25 MED ORDER — AMLODIPINE BESYLATE 10 MG PO TABS
10.0000 mg | ORAL_TABLET | Freq: Every day | ORAL | Status: DC
  Administered 2012-02-26 – 2012-03-21 (×25): 10 mg via ORAL
  Filled 2012-02-25 (×28): qty 1

## 2012-02-25 MED ORDER — MAGIC MOUTHWASH: 5.0000 mL | Freq: Four times a day (QID) | ORAL | Status: DC

## 2012-02-25 MED ORDER — CHLORHEXIDINE GLUCONATE 0.12 % MT SOLN
15.0000 mL | Freq: Two times a day (BID) | OROMUCOSAL | Status: DC
  Administered 2012-02-25 – 2012-03-18 (×43): 15 mL via OROMUCOSAL
  Filled 2012-02-25 (×50): qty 15

## 2012-02-25 MED ORDER — BISACODYL 10 MG RE SUPP: 10.0000 mg | Freq: Every day | RECTAL | Status: DC | PRN

## 2012-02-25 MED ORDER — IPRATROPIUM BROMIDE 0.02 % IN SOLN
0.5000 mg | Freq: Three times a day (TID) | RESPIRATORY_TRACT | Status: DC
  Administered 2012-02-25 – 2012-02-28 (×8): 0.5 mg via RESPIRATORY_TRACT
  Filled 2012-02-25 (×10): qty 2.5

## 2012-02-25 MED ORDER — MENTHOL 3 MG MT LOZG: 1.0000 | LOZENGE | OROMUCOSAL | Status: DC | PRN

## 2012-02-25 MED ORDER — OXYCODONE HCL 5 MG PO TABS
5.0000 mg | ORAL_TABLET | ORAL | Status: DC | PRN
Start: 2012-02-25 — End: 2012-03-21
  Administered 2012-02-25 – 2012-02-26 (×6): 10 mg via ORAL
  Administered 2012-02-27: 5 mg via ORAL
  Administered 2012-02-27 (×3): 10 mg via ORAL
  Administered 2012-02-27: 5 mg via ORAL
  Administered 2012-02-28 – 2012-03-11 (×50): 10 mg via ORAL
  Administered 2012-03-12: 5 mg via ORAL
  Administered 2012-03-12 – 2012-03-21 (×41): 10 mg via ORAL
  Filled 2012-02-25 (×7): qty 2
  Filled 2012-02-25: qty 1
  Filled 2012-02-25 (×9): qty 2
  Filled 2012-02-25: qty 1
  Filled 2012-02-25 (×96): qty 2

## 2012-02-25 MED ORDER — DIPHENHYDRAMINE HCL 50 MG PO CAPS: ORAL_CAPSULE | ORAL | Status: DC

## 2012-02-25 MED ORDER — ENSURE COMPLETE PO LIQD
237.0000 mL | Freq: Two times a day (BID) | ORAL | Status: DC
  Administered 2012-02-26 – 2012-02-29 (×4): 237 mL via ORAL

## 2012-02-25 MED ORDER — ALBUTEROL SULFATE (5 MG/ML) 0.5% IN NEBU: 2.5000 mg | INHALATION_SOLUTION | RESPIRATORY_TRACT | Status: DC | PRN

## 2012-02-25 MED ORDER — DIPHENHYDRAMINE HCL 50 MG PO CAPS
50.0000 mg | ORAL_CAPSULE | Freq: Every day | ORAL | Status: DC
  Administered 2012-02-25 – 2012-03-04 (×9): 50 mg via ORAL
  Filled 2012-02-25 (×12): qty 1

## 2012-02-25 MED ORDER — WHITE PETROLATUM GEL: 1.0000 "application " | Status: DC | PRN

## 2012-02-25 MED ORDER — SODIUM CHLORIDE 0.9 % IJ SOLN: 10.0000 mL | INTRAMUSCULAR | Status: DC | PRN

## 2012-02-25 MED ORDER — ENSURE COMPLETE PO LIQD: 237.0000 mL | Freq: Two times a day (BID) | ORAL | Status: DC

## 2012-02-25 MED ORDER — PRO-STAT SUGAR FREE PO LIQD: 30.0000 mL | Freq: Two times a day (BID) | ORAL | Status: DC

## 2012-02-25 MED ORDER — METHOCARBAMOL 500 MG PO TABS
500.0000 mg | ORAL_TABLET | Freq: Four times a day (QID) | ORAL | Status: DC | PRN
  Administered 2012-02-26 – 2012-03-21 (×54): 500 mg via ORAL
  Filled 2012-02-25 (×55): qty 1

## 2012-02-25 MED ORDER — BISACODYL 10 MG RE SUPP
10.0000 mg | Freq: Every day | RECTAL | Status: DC
Start: 2012-02-26 — End: 2012-02-26

## 2012-02-25 MED ORDER — TRAMADOL HCL 50 MG PO TABS
100.0000 mg | ORAL_TABLET | Freq: Four times a day (QID) | ORAL | Status: DC | PRN
  Administered 2012-02-26 – 2012-03-21 (×55): 100 mg via ORAL
  Filled 2012-02-25 (×4): qty 2
  Filled 2012-02-25: qty 1
  Filled 2012-02-25 (×6): qty 2
  Filled 2012-02-25: qty 1
  Filled 2012-02-25 (×39): qty 2
  Filled 2012-02-25: qty 1
  Filled 2012-02-25 (×4): qty 2
  Filled 2012-02-25: qty 1
  Filled 2012-02-25 (×3): qty 2

## 2012-02-25 MED ORDER — SACCHAROMYCES BOULARDII 250 MG PO CAPS
250.0000 mg | ORAL_CAPSULE | Freq: Two times a day (BID) | ORAL | Status: DC
  Administered 2012-02-25 – 2012-03-21 (×50): 250 mg via ORAL
  Filled 2012-02-25 (×56): qty 1

## 2012-02-25 MED ORDER — ACETAMINOPHEN 325 MG PO TABS: 650.0000 mg | ORAL_TABLET | Freq: Four times a day (QID) | ORAL | Status: AC | PRN

## 2012-02-25 MED ORDER — ENOXAPARIN SODIUM 80 MG/0.8ML ~~LOC~~ SOLN
80.0000 mg | Freq: Two times a day (BID) | SUBCUTANEOUS | Status: DC
  Administered 2012-02-26 – 2012-02-27 (×3): 80 mg via SUBCUTANEOUS
  Filled 2012-02-25 (×5): qty 0.8

## 2012-02-25 MED ORDER — POTASSIUM CHLORIDE CRYS ER 20 MEQ PO TBCR: 40.0000 meq | EXTENDED_RELEASE_TABLET | Freq: Two times a day (BID) | ORAL | Status: DC

## 2012-02-25 MED ORDER — PHENOL 1.4 % MT LIQD: 1.0000 | OROMUCOSAL | Status: DC | PRN

## 2012-02-25 MED ORDER — BISACODYL 5 MG PO TBEC: 10.0000 mg | DELAYED_RELEASE_TABLET | Freq: Every day | ORAL | Status: DC

## 2012-02-25 MED ORDER — OXYCODONE HCL 5 MG PO TABS: 5.0000 mg | ORAL_TABLET | ORAL | Status: DC | PRN

## 2012-02-25 MED ORDER — ACETAMINOPHEN 325 MG PO TABS
325.0000 mg | ORAL_TABLET | ORAL | Status: DC | PRN
  Filled 2012-02-25: qty 2

## 2012-02-25 MED ORDER — DIPHENHYDRAMINE HCL 50 MG PO CAPS: 50.0000 mg | ORAL_CAPSULE | Freq: Four times a day (QID) | ORAL | Status: DC | PRN

## 2012-02-25 MED ORDER — MAGIC MOUTHWASH
5.0000 mL | Freq: Four times a day (QID) | ORAL | Status: DC
  Administered 2012-02-25 – 2012-03-18 (×84): 5 mL via ORAL
  Filled 2012-02-25 (×96): qty 5

## 2012-02-25 MED ORDER — PROCHLORPERAZINE 25 MG RE SUPP: 12.5000 mg | Freq: Four times a day (QID) | RECTAL | Status: DC | PRN

## 2012-02-25 MED ORDER — BIOTENE DRY MOUTH MT LIQD
1.0000 "application " | Freq: Four times a day (QID) | OROMUCOSAL | Status: DC
  Administered 2012-02-26 – 2012-03-21 (×61): 15 mL via OROMUCOSAL

## 2012-02-25 MED ORDER — GUAIFENESIN-DM 100-10 MG/5ML PO SYRP: 5.0000 mL | ORAL_SOLUTION | Freq: Four times a day (QID) | ORAL | Status: DC | PRN

## 2012-02-25 MED ORDER — SODIUM CHLORIDE 0.9 % IJ SOLN: 10.0000 mL | Freq: Two times a day (BID) | INTRAMUSCULAR | Status: DC

## 2012-02-25 MED ORDER — CHLORHEXIDINE GLUCONATE 0.12 % MT SOLN: 15.0000 mL | Freq: Two times a day (BID) | OROMUCOSAL | Status: DC

## 2012-02-25 MED ORDER — TRAMADOL HCL 50 MG PO TABS: 100.0000 mg | ORAL_TABLET | Freq: Four times a day (QID) | ORAL | Status: DC | PRN

## 2012-02-25 MED ORDER — PROCHLORPERAZINE EDISYLATE 5 MG/ML IJ SOLN: 5.0000 mg | Freq: Four times a day (QID) | INTRAMUSCULAR | Status: DC | PRN

## 2012-02-25 MED ORDER — PROCHLORPERAZINE MALEATE 5 MG PO TABS: 5.0000 mg | ORAL_TABLET | Freq: Four times a day (QID) | ORAL | Status: DC | PRN

## 2012-02-25 MED ORDER — VANCOMYCIN HCL 10 G IV SOLR: INTRAVENOUS | Status: DC

## 2012-02-25 NOTE — H&P (Signed)
Physical Medicine and Rehabilitation Admission H&P  Chief Complaint   Patient presents with severe weakness.  .    :  HPI: TU BAYLE is a 52 y.o. male With hx of prior CVA, HTN hyperlipidemia, smoking, heavy alcohol use who developed small lesion on middle finger of L hand around 1/03. Developed severe upper L chest pain 1/08 and seen by primary MD 1/09. CXR normal @ that time. 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 Seen by Onc 1/15 and plans for eval of presumed lung cancer. Admitted via ED 1/17 with severe chest pain, hypoxemia, presumed PNA and continued presumption of LUL malignancy. Blood cultures drawn, abx started. Blood cx's turned positive on 1/18 ultimately growing MRSA in 2/2. In the interim he had deteriorated and CT showed suspected to was infact a large left-sided chest wall abscess communicating with the adjacent intrathoracic/lung abscess. He was transported emergently from [facility] to [facility] for emergent cardiothoracic surgery and underwent exploration of chest wound, minithoracotomy evacuation of chest wall abscess and intrapleural abscess with placement of chest tube and a wound vacuum dressing on 01/19 by Dr. Tyrone Sage.  ID consulted and recommended TEE for full workup. TEE revealed EF 60-65% and no thrombus. On 01/22 LUE doppler done revealing DVT in subclavian and axillary vein and patient started on heparin for treatment. On 01/23 patient was noted to have to be quadriparetic and Dr. Amada Jupiter consulted for input. MRI brain and spine done revealing remote infarcts and diffuse infection surrounding cervical and upper thoracic spine involving vertebra, soft tissue and with extension into epidural space causing cord compression C2-T1 and Question subtle enhancement of nerve roots in lumbar spine. Dr. Venetia Maxon consulted for input and patient underwent posterior cervical laminectomy for evacuation  of abscess on the same day. Vean wean initiated and patient tolerated extubation on 02/17/12. ID recommends continuing antibiotics for 6 weeks from the date of his I & D.  VAC dressing changes ongoing MWF. Patient did have a drop in H/H ot 7.5 and heparin discontinued on 01/25. He was changed over to treatment dose lovenox once H/H stabilized. He has had some improvement in UE strength. Therapy ongoing and working on core activation at EOB and ROM for edema control BUE. He's showing improvement in endurance but continues to be limited by neck pain when up. Intermittent transient asymptomatic bradycardia noted with HR down to 30s. Leucocytosis resolving. Therapy team recommended CIR and patient admitted today for further therapies.  Review of Systems  HENT: Positive for neck pain. Negative for hearing loss.  Eyes: Negative for blurred vision and double vision.  Respiratory: Positive for cough and sputum production. Negative for shortness of breath.  Cardiovascular: Negative for chest pain and palpitations.  Gastrointestinal: Positive for constipation. Negative for heartburn and nausea.  Musculoskeletal: Positive for myalgias. Negative for falls.  Neurological: Positive for sensory change. Negative for headaches.  Psychiatric/Behavioral: The patient is nervous/anxious. The patient does not have insomnia.   Past Medical History   Diagnosis  Date   .  Hypercholesteremia  02/06/2012   .  Stroke  02/06/2012     09/13 secondary to hypertensive crisis.    Past Surgical History   Procedure  Date   .  Inguinal hernia repair    .  Irrigation and debridement abscess  02/10/2012     Procedure: IRRIGATION AND DEBRIDEMENT ABSCESS; Surgeon: Delight Ovens, MD; Location: [facility] OR; Service: Thoracic; Laterality:  Left;   .  Thoracotomy  02/10/2012     Procedure: MINI/LIMITED THORACOTOMY; Surgeon: Delight Ovens, MD; Location: [facility] OR; Service: Thoracic; Laterality: Left;   .  Chest exploration  02/11/2012      Procedure: CHEST EXPLORATION; Surgeon: Delight Ovens, MD; Location: [facility] OR; Service: Thoracic; Laterality: Left; re-exploration of left chest wall; with wound vac change   .  Minor application of wound vac  02/11/2012     Procedure: MINOR APPLICATION OF WOUND VAC; Surgeon: Delight Ovens, MD; Location: [facility] OR; Service: Thoracic; Laterality: Left; wound vac change   .  Posterior cervical laminectomy for epidural abscess  02/14/2012     Procedure: POSTERIOR CERVICAL LAMINECTOMY FOR EPIDURAL ABSCESS; Surgeon: Maeola Harman, MD; Location: [facility]UR[facility] ORS; Service: Neurosurgery; Laterality: N/A; Cervical Laminectomy for Epidural Abscess    Family History   Problem  Relation  Age of Onset   .  Hypothyroidism  Mother     Social History: Lives with mother ( in 68's and works part time) and brother. Married but does not live with wife consistently. He reports that he has been smoking-1/2 PPD. He has never used smokeless tobacco. He reports that he drinks about 2-4 beers and 2 mixed drinks daily. Marland Kitchen He reports that he does not use illicit drugs. 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  Allergies: No Known Allergies  Scheduled Meds:  .  ipratropium  0.5 mg  Nebulization  TID    And   .  albuterol  2.5 mg  Nebulization  TID   .  amLODipine  10 mg  Oral  Daily   .  antiseptic oral rinse  1 application  Mouth Rinse  QID   .  bisacodyl  10 mg  Oral  Daily   .  chlorhexidine  15 mL  Mouth Rinse  BID   .  diphenhydrAMINE  50 mg  Oral  QHS   .  enoxaparin (LOVENOX) injection  80 mg  Subcutaneous  Q12H   .  feeding supplement  237 mL  Oral  BID BM   .  feeding supplement  30 mL  Oral  BID   .  ipratropium  0.5 mg  Nebulization  Once   .  magic mouthwash  5 mL  Oral  QID   .  potassium chloride  40 mEq  Oral  BID   .  sodium chloride  10-40 mL  Intracatheter  Q12H   .  vancomycin  1,250 mg  Intravenous  Q8H    Medications Prior to Admission   Medication  Sig  Dispense  Refill   .   aspirin 81 MG tablet  Take 81 mg by mouth daily.     Marland Kitchen  lisinopril (PRINIVIL,ZESTRIL) 20 MG tablet  Take 20 mg by mouth daily before lunch.     .  morphine (MS CONTIN) 30 MG 12 hr tablet  Take 1 tablet (30 mg total) by mouth 2 (two) times daily.  60 tablet  0   .  Multiple Vitamin (MULTIVITAMIN) tablet  Take 1 tablet by mouth daily.     Marland Kitchen  oxyCODONE-acetaminophen (PERCOCET/ROXICET) 5-325 MG per tablet  Take 1 tablet by mouth 2 (two) times daily.     .  polyethylene glycol (MIRALAX / GLYCOLAX) packet  Take 17 g by mouth daily.     .  simvastatin (ZOCOR) 40 MG tablet  Take 40 mg by mouth every evening.  Home:  Home Living  Lives With: Family  Available Help at Discharge: Family (?wife or mother--pt unclear)  Type of Home: House  Home Access: Stairs to enter  Secretary/administrator of Steps: 2  Home Layout: One level  Bathroom Shower/Tub: Merchant navy officer: (unsure)  Home Adaptive Equipment: None  Additional Comments: pt reports family is in [employer] business and can make home modifications he will need  Functional History:  Prior Function  Able to Take Stairs?: Reciprically  Driving: Yes  Vocation: Unemployed  Comments: laid off in [place] after his stroke  Functional Status:  Mobility:  Bed Mobility  Bed Mobility: Rolling Right;Rolling Left  Rolling Right: 2: Max assist;With rail  Rolling Right: Patient Percentage: 10%  Rolling Left: 2: Max assist;With rail  Rolling Left: Patient Percentage: 10%  Supine to Sit: 1: +2 Total assist;HOB elevated  Supine to Sit: Patient Percentage: 30%  Sitting - Scoot to Edge of Bed: 1: +1 Total assist  Sitting - Scoot to Edge of Bed: Patient Percentage: 0%  Sit to Supine: 1: +2 Total assist;HOB flat  Sit to Supine: Patient Percentage: 30%  Sit to Sidelying Left: 1: +2 Total assist;HOB flat  Sit to Sidelying Left: Patient Percentage: 10%  Scooting to HOB: 1: +2 Total assist  Scooting  to [facility]: Patient Percentage: 0%  Transfers  Transfers: (used maximove to transfer to recliner for pressure relief)  Sit to Stand: 1: +2 Total assist;From elevated surface;From bed  Sit to Stand: Patient Percentage: 10%  Stand to Sit: 1: +2 Total assist;To bed;To elevated surface  Stand to Sit: Patient Percentage: 0%  Ambulation/Gait  Ambulation/Gait Assistance: Not tested (comment)   ADL:  ADL  Eating/Feeding: Moderate assistance  Where Assessed - Eating/Feeding: Chair (provided straw extension and pitcher with lid)  Grooming: +1 Total assistance  Where Assessed - Grooming: Supported sitting  Upper Body Bathing: +1 Total assistance  Where Assessed - Upper Body Bathing: Supported sitting  Lower Body Bathing: +2 Total assistance  Where Assessed - Lower Body Bathing: Supine, head of bed up;Rolling right and/or left  Upper Body Dressing: +1 Total assistance  Where Assessed - Upper Body Dressing: Supported sitting  Lower Body Dressing: +2 Total assistance  Where Assessed - Lower Body Dressing: Supine, head of bed up;Rolling right and/or left  Toilet Transfer: (not assessed)  Equipment Used: (hoyer)  Transfers/Ambulation Related to ADLs: Pt completed supine <>Sit EOB for core activation  ADL Comments: Pt provided peri hygiene supine log roll Rt and lt prior to mobility. pt supine<>Sit EOB. 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 Pt able to activate core postieriorly and left to right lean laterally. Pt attempting anterior pelvic tilt and needing (A). pt reports pain in neck wtih eob task in soft collar. Pt with posterior Rt lob in sitting. Pt with edema in Lt UE >Rt UE. Pt educated on elevation of Lt UE to decr edema and digit flexion/ extension. pt with incr return of Rt UE. pt able to hold pitcher and elbow flexion ~ 45 degrees to drink with extended straw. Pt smiling and grateful for self feeding. Pt fatigued at EOB and hoyer lifted to chair. pt pushed around unit to uplift patients spirits. Pt  reports "it feels good to be up and out of that room"  Cognition:  Cognition  Arousal/Alertness: Awake/alert  Orientation Level: Oriented X4  Cognition  Overall Cognitive Status: Appears within functional limits for tasks assessed/performed  Area of Impairment: Attention;Other (comment);Awareness of  deficits (internally distracted - ?due to meds/ ICU/ situational stres)  Arousal/Alertness: Awake/alert  Orientation Level: Appears intact for tasks assessed  Behavior During Session: [facility] for tasks performed  Current Attention Level: Selective  Attention - Other Comments: cues to maintain on topic  Awareness of Deficits: asked if he would going to outpt therapy next week  Blood pressure 142/76, pulse 86, temperature 98 F (36.7 C), temperature source Oral, resp. rate 23, height 5\' 10"  (1.778 m), weight 76.2 kg (167 lb 15.9 oz), SpO2 98.00%.  Physical Exam  Nursing note and vitals reviewed.  Constitutional: He is oriented to person, place, and time. He appears well-developed. He has a sickly appearance.  Cardiovascular: Normal rate and regular rhythm.  Pulmonary/Chest: Effort normal. He has rales in the left middle field.  Abdominal: Soft. Bowel sounds are normal.  Musculoskeletal: He exhibits no tenderness.  Minimal edema left forearm. Bilateral heels boggy--non tender.  Neurological: He is alert and oriented to person, place, and time.  Lying in loose, liquid stool-Incontinent of bowels--decreased sensation rectally. Decreased sensation to LT in both hands and feet.  DTR's 1+, no resting tone. Good voice/phonation, cognition is generally intact.   MOTOR TESTING:           Muscle  Right Left  Deltoid  2- 2- Triceps 3- 3- Biceps  2 2 Wrist ext 2+ 2+ HI  2 2 HF  3 3 KE  3 3 ADF  3 3 APF  3 3     Skin:  Posterior neck incision clean and dry with steri strips in place. Lower chest wall with sutured anteriorly. Upper left chest wall with VAC in place.   Results for orders placed  during the hospital encounter of 02/08/12 (from the past 48 hour(s))   CBC Status: Abnormal    Collection Time    02/24/12 4:02 AM   Component  Value  Range  Comment    WBC  9.6  4.0 - 10.5 K/uL     RBC  3.08 (*)  4.22 - 5.81 MIL/uL     Hemoglobin  9.3 (*)  13.0 - 17.0 g/dL     HCT  16.1 (*)  09.6 - 52.0 %     MCV  90.6  78.0 - 100.0 fL     MCH  30.2  26.0 - 34.0 pg     MCHC  33.3  30.0 - 36.0 g/dL     RDW  04.5  40.9 - 81.1 %     Platelets  615 (*)  150 - 400 K/uL    BASIC METABOLIC PANEL Status: Abnormal    Collection Time    02/24/12 4:02 AM   Component  Value  Range  Comment    Sodium  135  135 - 145 mEq/L     Potassium  3.4 (*)  3.5 - 5.1 mEq/L     Chloride  105  96 - 112 mEq/L     CO2  22  19 - 32 mEq/L     Glucose, Bld  115 (*)  70 - 99 mg/dL     BUN  9  6 - 23 mg/dL     Creatinine, Ser  9.14 (*)  0.50 - 1.35 mg/dL     Calcium  8.0 (*)  8.4 - 10.5 mg/dL     GFR calc non Af Amer  >90  >90 mL/min     GFR calc Af Amer  >90  >90 mL/min    MAGNESIUM Status:  Normal    Collection Time    02/24/12 4:02 AM   Component  Value  Range  Comment    Magnesium  1.8  1.5 - 2.5 mg/dL    BASIC METABOLIC PANEL Status: Abnormal    Collection Time    02/25/12 10:26 AM   Component  Value  Range  Comment    Sodium  134 (*)  135 - 145 mEq/L     Potassium  4.0  3.5 - 5.1 mEq/L     Chloride  102  96 - 112 mEq/L     CO2  23  19 - 32 mEq/L     Glucose, Bld  137 (*)  70 - 99 mg/dL     BUN  10  6 - 23 mg/dL     Creatinine, Ser  8.41  0.50 - 1.35 mg/dL     Calcium  9.1  8.4 - 10.5 mg/dL     GFR calc non Af Amer  >90  >90 mL/min     GFR calc Af Amer  >90  >90 mL/min    VANCOMYCIN, TROUGH Status: Abnormal    Collection Time    02/25/12 10:41 AM   Component  Value  Range  Comment    Vancomycin Tr  24.2 (*)  10.0 - 20.0 ug/mL     Dg Chest Port 1 View  02/25/2012 *RADIOLOGY REPORT* Clinical Data: Rechecked chest tube removal PORTABLE CHEST - 1 VIEW Comparison: Chest radiograph 01/29/ 2014 Findings:  Normal cardiac silhouette. Left PICC line in place. No evidence of pneumothorax following left chest tube removal. There is increased rounded nodularity in the left lower lobe measuring 2 cm . Mild bilateral air space disease is similar to prior. IMPRESSION: 1. No evidence pneumothorax. 2. New left lower lobe rounded nodule suggests a new focus of infection. 3. Scattered air space disease is similar to prior. Original Report Authenticated By: Genevive Bi, M.D.   Post Admission Physician Evaluation:  1. 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 Pt with central cord clinical picture. He is s/p surgical decompression. 2. Patient is admitted to receive collaborative, interdisciplinary care between the physiatrist, rehab nursing staff, and therapy team. 3. Patient's level of medical complexity and substantial therapy needs in context of that medical necessity cannot be provided at a lesser intensity of care such as a SNF. 4. Patient has experienced substantial functional loss from his/her baseline which was documented above under the "Functional History" and "Functional Status" headings. Judging by the patient's diagnosis, physical exam, and functional history, the patient has potential for functional progress which will result in measurable gains while on inpatient rehab. These gains will be of substantial and practical use upon discharge in facilitating mobility and self-care at the household level. 5. Physiatrist will provide 24 hour management of medical needs as well as oversight of the therapy plan/treatment and provide guidance as appropriate regarding the interaction of the two. 6. 24 hour rehab nursing will assist with bladder management, bowel management, safety, skin/wound care, disease management, medication administration, pain management and patient education and help integrate therapy concepts,  techniques,education, etc. 7. PT will assess and treat for: Lower extremity strength, range of motion, stamina, balance, functional mobility, safety, adaptive techniques and equipment, NMR, pain mgt, education. Goals are: min to mod assist. 8. OT will assess and treat for: ADL's, functional mobility, safety, upper extremity strength, adaptive techniques and equipment, NMR, pain mgt, orthotics. Goals are:  min to moderate assist. 9. SLP will assess and treat for: n/a. Goals are: n/a. 10. Case Management and Social Worker will assess and treat for psychological issues and discharge planning. 11. Team conference will be held weekly to assess progress toward goals and to determine barriers to discharge. 12. Patient will receive at least 3 hours of therapy per day at least 5 days per week. 13. ELOS: 3-4 weeks Prognosis: good Medical Problem List and Plan:  1. 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  2. Pain Management: Cervical collar prn for comfort.  3. Mood: Seems to have a good outlook. Will monitor along. LCSW to follow for formal evaluation.  4. Neuropsych: This patient is capable of making decisions on his/her own behalf.  5. Disseminated MRSA: S/P evacuation of abscess and minithorocotomy Vancomycin D# 17/42  6. Anemia due to infection/crutical illness: Add iron supplement.  7. Hyponatremia: recheck in am.  8. Abnormal LFTs: Likely due to sepsis. No GI symptoms. Will recheck in am.  9. Neurogenic bowel/bladder- will review plan with RN. Consider am bowel program   02/25/2012  Ranelle Oyster, MD, [place]

## 2012-02-25 NOTE — Progress Notes (Signed)
Pt is medically cleared for CIR transfer as soon as a bed is available.  Lonia Blood, MD [facility] Office  [phone] Pager [phone]  On-Call/Text Page:      [email or web address]      password [facility]

## 2012-02-25 NOTE — Progress Notes (Signed)
Pt transferring to 4035 via bed with belongings, mom aware of new room number. Frank Brock

## 2012-02-25 NOTE — Progress Notes (Signed)
Patient ID: Frank Brock, male   DOB: 18-Aug-1960, 52 y.o.   MRN: [id number] [facility] DAILY PROGRESS NOTE                   [address]            [place] [id number]          [phone]      11 Days Post-Op Procedure(s) (LRB): POSTERIOR CERVICAL LAMINECTOMY FOR EPIDURAL ABSCESS (N/A)  Total Length of Stay:  LOS: 17 days   Subjective: Feels better, gaining strength in extremities, still " like novocain in feet and hands  Objective: Vital signs in last 24 hours: Temp:  [97.9 F (36.6 C)-98.4 F (36.9 C)] 98.3 F (36.8 C) (02/03 0358) Pulse Rate:  [78-88] 86  (02/03 0759) Cardiac Rhythm:  [-] Normal sinus rhythm (02/03 0759) Resp:  [17-23] 23  (02/03 0759) BP: (129-155)/(65-76) 142/76 mmHg (02/03 0759) SpO2:  [96 %-100 %] 96 % (02/03 0759) Weight:  [167 lb 15.9 oz (76.2 kg)] 167 lb 15.9 oz (76.2 kg) (02/03 0400)  Filed Weights   02/22/12 0447 02/24/12 0400 02/25/12 0400  Weight: 179 lb 10.8 oz (81.5 kg) 169 lb 5 oz (76.8 kg) 167 lb 15.9 oz (76.2 kg)    Weight change: -1 lb 5.2 oz (-0.6 kg)   Hemodynamic parameters for last 24 hours:    Intake/Output from previous day: 02/02 0701 - 02/03 0700 In: 750 [IV Piggyback:750] Out: 3825 [Urine:3800; Drains:25]  Intake/Output this shift: Total I/O In: 240 [P.O.:240] Out: -   Current Meds: Scheduled Meds:   . ipratropium  0.5 mg Nebulization TID   And  . albuterol  2.5 mg Nebulization TID  . amLODipine  10 mg Oral Daily  . antiseptic oral rinse  1 application Mouth Rinse QID  . bisacodyl  10 mg Oral Daily  . chlorhexidine  15 mL Mouth Rinse BID  . diphenhydrAMINE  50 mg Oral QHS  . enoxaparin (LOVENOX) injection  80 mg Subcutaneous Q12H  . feeding supplement  237 mL Oral BID BM  . feeding supplement  30 mL Oral BID  . ipratropium  0.5 mg Nebulization Once  . magic mouthwash  5 mL Oral QID  . potassium chloride  40 mEq Oral BID  . sodium chloride  10-40 mL Intracatheter Q12H  . vancomycin  1,250  mg Intravenous Q8H   Continuous Infusions:  PRN Meds:.acetaminophen, albuterol, menthol-cetylpyridinium, ondansetron (ZOFRAN) IV, oxyCODONE, phenol, sodium chloride, traMADol, white petrolatum  General appearance: alert and cooperative Neurologic: extremity strength continues to improve Heart: regular rate and rhythm, S1, S2 normal, no murmur, click, rub or gallop Lungs: clear to auscultation bilaterally Abdomen: soft, non-tender; bowel sounds normal; no masses,  no organomegaly Extremities: as above Wound: vac removed todau and wound examined, granulating well  Lab Results: CBC: Basename 02/24/12 0402 02/23/12 0500  WBC 9.6 10.8*  HGB 9.3* 9.0*  HCT 27.9* 26.9*  PLT 615* 587*   BMET:  Basename 02/24/12 0402 02/23/12 0500  NA 135 134*  K 3.4* 3.4*  CL 105 104  CO2 22 22  GLUCOSE 115* 118*  BUN 9 11  CREATININE 0.49* 0.56  CALCIUM 8.0* 7.9*    PT/INR: No results found for this basename: LABPROT,INR in the last 72 hours Radiology: No results found.   Assessment/Plan: S/P Procedure(s) (LRB): POSTERIOR CERVICAL LAMINECTOMY FOR EPIDURAL ABSCESS (N/A) continue wound vac care of anterior chest wound To inpatient rehab soon     Saundra Gin  B 02/25/2012 9:00 AM

## 2012-02-25 NOTE — Progress Notes (Signed)
Clinical Child psychotherapist (CSW) informed that pt will dc to CIR today. No additional CSW needs requested at this time. CSW signing off.  Theresia Bough, MSW, Theresia Majors [phone]

## 2012-02-25 NOTE — Progress Notes (Signed)
Patient admitted to IP Rehab at 45. AOx4, wound vac intact, foley intact, no c/o pain, premedicated prior to transfer. Refused flu/PNA vaccine, VIS sheets given, patient verbalized understanding. MRSA precautions placed. Reviewed call bell system, safety plan, and rehab schedule. Patient verbalized understanding. Frank Brock

## 2012-02-25 NOTE — Progress Notes (Signed)
ANTIBIOTIC& ANTICOAGULANT CONSULT NOTE - FOLLOW UP  Pharmacy Consult for Vancomycin and Lovenox Indication: MRSA infections/ RUE DVT  No Known Allergies  Patient Measurements: Height: 5\' 10"  (177.8 cm) Weight: 167 lb 15.9 oz (76.2 kg) IBW/kg (Calculated) : 73   Vital Signs: Temp: 98 F (36.7 C) (02/03 1115) Temp src: Oral (02/03 1115) BP: 142/76 mmHg (02/03 0759) Pulse Rate: 86  (02/03 0759) Intake/Output from previous day: 02/02 0701 - 02/03 0700 In: 750 [IV Piggyback:750] Out: 3825 [Urine:3800; Drains:25] Intake/Output from this shift: Total I/O In: 240 [P.O.:240] Out: 1000 [Urine:1000]  Labs:  Basename 02/25/12 1026 02/24/12 0402 02/23/12 0500  WBC -- 9.6 10.8*  HGB -- 9.3* 9.0*  PLT -- 615* 587*  LABCREA -- -- --  CREATININE 0.53 0.49* 0.56   Estimated Creatinine Clearance: 114.1 ml/min (by C-G formula based on Cr of 0.53).  Basename 02/25/12 1041  VANCOTROUGH 24.2*  VANCOPEAK --  Drue Dun --  GENTTROUGH --  GENTPEAK --  GENTRANDOM --  TOBRATROUGH --  TOBRAPEAK --  TOBRARND --  AMIKACINPEAK --  AMIKACINTROU --  AMIKACIN --    Assessment:   Day #18 Vancomycin.  Trough level is now above goal on 1250 mg IV q8hrs. Last trough on 1/27 was 17.7 mcg/ml, at goal.  Some accumulation.  BUN/Cr about the same. UOP down some but I/O negative.  For transfer to Rehab today.  Planning Vancomycin for 6 weeks from 02/14/11->thru 3/6.    Continues on Lovenox 80 mg SQ q12hrs for RUE DVT.   Goal of Therapy:  Vancomycin trough level 15-20 mcg/ml  Plan:   Extend Vancomycin dosing interval from q8hrs to q12hrs.  Will plan to recheck trough level in ~4 days; continue to follow renal function.   Continues on Lovenox 80 mg Q12hrs.  CBC at least every 3 days.  Will follow up for transition to oral agent when possible.  Dennie Fetters, RPh Pager: [phone] 02/25/2012,1:54 PM

## 2012-02-25 NOTE — PMR Pre-admission (Signed)
PMR Admission Coordinator Pre-Admission Assessment  Patient: Frank Brock is an 52 y.o., male MRN: [id number] DOB: 02/11/1960 Height: 5\' 10"  (177.8 cm) Weight: 76.2 kg (167 lb 15.9 oz)              Insurance Information HMO: YES   PPO:       PCP:       IPA:       80/20:       OTHER:  POS 2 choice  PRIMARY: [employer] Medicare      Policy#: [id number]      Subscriber: Darolyn Rua CM Name: Lind Covert      Phone#: [phone]     Fax#: [phone] Pre-Cert#: [id number]  Auth 02/03-02/12 10 days      Employer:  Benefits:  Phone #: [phone]     Name: Saul Fordyce. Date: 01/23/12     Deduct: $0      Out of Pocket Max: None      Life Max: unlimited CIR: w/auth 80%      SNF: w/auth 80%  100 days Outpatient: 60 visits combined     Co-Pay: $40/visit Home Health: w/auth 80%      Co-Pay: 20% DME: 80%     Co-Pay: 20% Providers: in network  Emergency Contact Information Contact Information    Name Relation Home Work Mobile   [place] Mother [phone]     [place], [place] [phone]  [phone]     Current Medical History  Patient Admitting Diagnosis:  Cord compression with tetraplegia  History of Present Illness: A 52 y.o. male with hx of prior CVA, HTN hyperlipidemia, smoking, heavy alcohol use who developed small lesion on middle finger of L hand around 1/03. Developed severe upper L chest pain 1/08 and seen by primary MD 1/09. CXRrmal @ that time. 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 Seen by Onc 1/15 and plans for eval of presumed lung cancer. Admitted via ED 1/17 with severe chest pain, hypoxemia, presumed PNA and continued presumption of LUL malignancy. Blood cultures drawn, abx started. Blood cx's turned positive on 1/18 ultimately growing MRSA in 2/2. In the interim he had deteriorated and CT showed suspected to was infact a large left-sided chest wall abscess communicating with the  adjacent intrathoracic/lung abscess. He was transported emergently from [facility] to [facility] for emergent cardiothoracic surgery and underwent exploration of chest wound, minithoracotomy evacuation of chest wall abscess and intrapleural abscess with placement of chest tube and a wound vacuum dressing on 01/19 by Dr. Tyrone Sage.  ID consulted and recommended TEE for full workup. TEE revealed EF 60-65% and no thrombus. On 01/22 LUE doppler done revealing DVT in subclavian and axillary vein and patient started on heparin for treatment. On 01/23 patient was noted to have to be quadriparetic and Dr. Amada Jupiter consulted for input. MRI brain and spine done revealing remote infarcts and diffuse infection surrounding cervical and upper thoracic spine involving vertebra, soft tissue and with extension into epidural space causing cord compression C2-T1 and Question subtle enhancement of nerve roots in lumbar spine. Dr. Venetia Maxon consulted for input and patient underwent posterior cervical laminectomy for evacuation of abscess on the same day. White count improving. vean wean initiated and patient tolerated extubation on 02/17/12. ID expressing concerns that patient with continued positive cultures from neck and final recommendations on treatment pending.PT/OT evaluations done 02/18/12 and patient able to sit at EOB for 10 mins with max  assist and frequent rest breaks. CT removed 01/28.  On 02/02 able to sit up in chair for several hours and tolerated well.  Is motivated and can tolerate intense rehab program.  Past Medical History  Past Medical History  Diagnosis Date  . Hypercholesteremia 02/06/2012  . Stroke 02/06/2012    09/13 secondary to hypertensive crisis.    Family History  family history includes Hypothyroidism in his mother.  Prior Rehab/Hospitalizations: Had [facility] and outpatient therapies 3 yrs ago after collar bone injury.  Current Medications  Current facility-administered medications:acetaminophen  (TYLENOL) tablet 650 mg, 650 mg, Oral, Q6H PRN, Simonne Martinet, NP, 650 mg at 02/22/12 0900;  albuterol (PROVENTIL) (5 MG/ML) 0.5% nebulizer solution 2.5 mg, 2.5 mg, Nebulization, Q3H PRN, Alyson Reedy, MD;  albuterol (PROVENTIL) (5 MG/ML) 0.5% nebulizer solution 2.5 mg, 2.5 mg, Nebulization, TID, Calvert Cantor, MD, 2.5 mg at 02/25/12 0921 amLODipine (NORVASC) tablet 10 mg, 10 mg, Oral, Daily, Calvert Cantor, MD, 10 mg at 02/25/12 1046;  antiseptic oral rinse (BIOTENE) solution 15 mL, 1 application, Mouth Rinse, QID, Lonia Farber, MD, 15 mL at 02/25/12 1200;  bisacodyl (DULCOLAX) EC tablet 10 mg, 10 mg, Oral, Daily, Wayne E Gold, PA, 10 mg at 02/25/12 1046;  chlorhexidine (PERIDEX) 0.12 % solution 15 mL, 15 mL, Mouth Rinse, BID, Alyson Reedy, MD, 15 mL at 02/25/12 0801 diphenhydrAMINE (BENADRYL) capsule 50 mg, 50 mg, Oral, QHS, Calvert Cantor, MD, 50 mg at 02/24/12 2236;  enoxaparin (LOVENOX) injection 80 mg, 80 mg, Subcutaneous, Q12H, Dannielle Karvonen Dellrose, PHARMD, 80 mg at 02/25/12 0608;  feeding supplement (ENSURE COMPLETE) liquid 237 mL, 237 mL, Oral, BID BM, Ailene Ards, RD, 237 mL at 02/23/12 0941 feeding supplement (PRO-STAT SUGAR FREE 64) liquid 30 mL, 30 mL, Oral, BID, Ailene Ards, RD, 30 mL at 02/24/12 0909;  ipratropium (ATROVENT) nebulizer solution 0.5 mg, 0.5 mg, Nebulization, TID, Calvert Cantor, MD, 0.5 mg at 02/25/12 0865;  ipratropium (ATROVENT) nebulizer solution 0.5 mg, 0.5 mg, Nebulization, Once, Calvert Cantor, MD;  magic mouthwash, 5 mL, Oral, QID, Calvert Cantor, MD, 5 mL at 02/25/12 1047 menthol-cetylpyridinium (CEPACOL) lozenge 3 mg, 1 lozenge, Oral, PRN, Maeola Harman, MD;  ondansetron [facility]) injection 4 mg, 4 mg, Intravenous, Q4H PRN, Maeola Harman, MD;  oxyCODONE (Oxy IR/ROXICODONE) immediate release tablet 5-10 mg, 5-10 mg, Oral, Q4H PRN, Lonia Farber, MD, 10 mg at 02/25/12 7846;  phenol (CHLORASEPTIC) mouth spray 1 spray, 1 spray, Mouth/Throat,  PRN, Maeola Harman, MD potassium chloride SA (K-DUR,KLOR-CON) CR tablet 40 mEq, 40 mEq, Oral, BID, Calvert Cantor, MD, 40 mEq at 02/25/12 1046;  sodium chloride 0.9 % injection 10-40 mL, 10-40 mL, Intracatheter, Q12H, Curlene Dolphin, MD, 10 mL at 02/24/12 2049;  sodium chloride 0.9 % injection 10-40 mL, 10-40 mL, Intracatheter, PRN, Curlene Dolphin, MD, 10 mL at 02/24/12 0405 traMADol (ULTRAM) tablet 100 mg, 100 mg, Oral, Q6H PRN, Wilmon Pali, PA, 100 mg at 02/25/12 1112;  vancomycin (VANCOCIN) 1,250 mg in sodium chloride 0.9 % 250 mL IVPB, 1,250 mg, Intravenous, Q8H, Anh P Pham, PHARMD, 1,250 mg at 02/25/12 1112;  white petrolatum (VASELINE) gel, , Topical, PRN, Alyson Reedy, MD, 0.2 application at 02/20/12 0048  Patients Current Diet: Cardiac  Precautions / Restrictions Precautions Precautions: Cervical;Fall Precaution Comments: watch BP Cervical Brace: Soft collar Restrictions Weight Bearing Restrictions: No   Prior Activity Level   Home Assistive Devices / Equipment Home Assistive Devices/Equipment: None Home Adaptive Equipment: None  Prior Functional Level Prior Function  Level of Independence: Independent Able to Take Stairs?: Reciprically Driving: Yes Vocation: Unemployed Comments: laid off in [place] after his stroke  Current Functional Level Cognition  Arousal/Alertness: Awake/alert Overall Cognitive Status: Appears within functional limits for tasks assessed/performed Current Attention Level: Selective Attention - Other Comments: cues to maintain on topic Orientation Level: Oriented X4 Awareness of Deficits: asked if he would going to outpt therapy next week    Extremity Assessment (includes Sensation/Coordination)  RUE ROM/Strength/Tone: Deficits RUE ROM/Strength/Tone Deficits: shoulder @ 2/5. elbow 2+/5 flex. 2-/5 ext. wrist flex 2_. wrist ext 2+. unable to achieve gross grasp. unable to achieve full extension (thumb movement observed) RUE  Sensation: Deficits RUE Sensation Deficits: decreased sensation throughout RUE below shoulder level RUE Coordination: Deficits RUE Coordination Deficits: unable to make fist  RLE ROM/Strength/Tone: Deficits RLE ROM/Strength/Tone Deficits: AAROM WFL; hip flexion 2+, hip ext 3+, hip abdct 2+, hip add 2+; knee ext 2+; ankle DF 2+, ankle PF 3- RLE Sensation: Deficits RLE Sensation Deficits: numbness throughout, however can detect light touch in toes RLE Coordination: Deficits    ADLs  Eating/Feeding: Moderate assistance Where Assessed - Eating/Feeding: Chair (provided straw extension and pitcher with lid) Grooming: +1 Total assistance Where Assessed - Grooming: Supported sitting Upper Body Bathing: +1 Total assistance Where Assessed - Upper Body Bathing: Supported sitting Lower Body Bathing: +2 Total assistance Where Assessed - Lower Body Bathing: Supine, head of bed up;Rolling right and/or left Upper Body Dressing: +1 Total assistance Where Assessed - Upper Body Dressing: Supported sitting Lower Body Dressing: +2 Total assistance Where Assessed - Lower Body Dressing: Supine, head of bed up;Rolling right and/or left Toilet Transfer:  (not assessed) Equipment Used:  (hoyer) Transfers/Ambulation Related to ADLs: Pt completed supine <>Sit EOB for core activation ADL Comments: Pt provided peri hygiene supine log roll Rt and lt prior to mobility. pt supine<>Sit EOB. 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 Pt able to activate core postieriorly and left to right lean laterally. Pt attempting anterior pelvic tilt and needing (A). pt reports pain in neck wtih eob task in soft collar. Pt with posterior Rt lob in sitting. Pt with edema in Lt UE >Rt UE. Pt educated on elevation of Lt UE to decr edema and digit flexion/ extension. pt with incr return of Rt UE. pt able to hold pitcher and elbow flexion ~ 45 degrees to drink with extended straw. Pt smiling and grateful for self feeding. Pt fatigued at EOB and  hoyer lifted to chair. pt pushed around unit to uplift patients spirits. Pt reports "it feels good to be up and out of that room"     Mobility  Bed Mobility: Rolling Right;Rolling Left Rolling Right: 2: Max assist;With rail Rolling Right: Patient Percentage: 10% Rolling Left: 2: Max assist;With rail Rolling Left: Patient Percentage: 10% Supine to Sit: 1: +2 Total assist;HOB elevated Supine to Sit: Patient Percentage: 30% Sitting - Scoot to Edge of Bed: 1: +1 Total assist Sitting - Scoot to Edge of Bed: Patient Percentage: 0% Sit to Supine: 1: +2 Total assist;HOB flat Sit to Supine: Patient Percentage: 30% Sit to Sidelying Left: 1: +2 Total assist;HOB flat Sit to Sidelying Left: Patient Percentage: 10% Scooting to HOB: 1: +2 Total assist Scooting to [facility]: Patient Percentage: 0%    Transfers  Transfers:  (used maximove to transfer to recliner for pressure relief) Sit to Stand: 1: +2 Total assist;From elevated surface;From bed Sit to Stand: Patient Percentage: 10% Stand to Sit: 1: +2 Total assist;To bed;To elevated surface  Stand to Sit: Patient Percentage: 0%    Ambulation / Gait / Stairs / Wheelchair Mobility  Ambulation/Gait Ambulation/Gait Assistance: Not tested (comment)    Posture / Balance Static Sitting Balance Static Sitting - Balance Support: Bilateral upper extremity supported;Feet supported Static Sitting - Level of Assistance: 2: Max assist Static Sitting - Comment/# of Minutes: ~ 5 minutes to promote upright posture and prevent right lean. Pt limited due to neck pain Dynamic Sitting Balance Dynamic Sitting - Balance Support: Right upper extremity supported;Left upper extremity supported;Feet supported Dynamic Sitting - Level of Assistance: 2: Max assist Dynamic Sitting Balance - Compensations: (A) to maintain balance with manual and tactile cues to promote forward/back weight shift to initiate trunk and lateral lean Dynamic Sitting - Comments: able to assist with  pushing up to midline from R/L using BUE    Special needs/care consideration BiPAP/CPAP No CPM No Continuous Drip IV No Dialysis NO        Life Vest No Oxygen No Special Bed No Trach Size No Wound Vac (area) Yes      Location Left chest Skin  Yes, wound VAC to left chest                              Bowel mgmt: Incontinent of bowel 02/03 Bladder mgmt: Currently has a catheter Diabetic mgmt    Previous Home Environment Living Arrangements: Parent Lives With: Family Available Help at Discharge: Family (?wife or mother--pt unclear) Type of Home: House Home Layout: One level Home Access: Stairs to enter Secretary/administrator of Steps: 2 Bathroom Shower/Tub: Engineer, manufacturing systems: Pharmacist, community:  (unsure) Home Care Services: No Additional Comments: pt reports family is in [employer] representative business and can make home modifications he will need  Discharge Living Setting Plans for Discharge Living Setting: House;Lives with (comment) (Lives with mom, stepdad and brother.) Type of Home at Discharge: House Discharge Home Layout: One level Discharge Home Access: Stairs to enter Entrance Stairs-Number of Steps: 8 steps at the front and 1 at the back. Do you have any problems obtaining your medications?: No  Social/Family/Support Systems Patient Roles: Spouse;Parent Contact Information: Truxton Stupka - mother [phone]; Samarion Ehle - wife [phone] Anticipated Caregiver: Mom, brother, wife Ability/Limitations of Caregiver: Mom works days, wife stays at home, has 2 brothers who can assist as well. Caregiver Availability: Other (Comment) (Wife to assist days, mom at night, brothers to help as well.) Discharge Plan Discussed with Primary Caregiver: Yes (I spoke with wife and left message with mom.) Is Caregiver In Agreement with Plan?: Yes Does Caregiver/Family have Issues with Lodging/Transportation while Pt is in Rehab?: No  Goals/Additional  Needs Patient/Family Goal for Rehab: PT mod A, OT mod/Max A, ST S/min A goals Expected length of stay: 3-4 weeks Cultural Considerations: None Dietary Needs: Heart with thin liquids Equipment Needs: TBD Pt/Family Agrees to Admission and willing to participate: Yes Program Orientation Provided & Reviewed with Pt/Caregiver Including Roles  & Responsibilities: Yes  Decrease burden of Care through IP rehab admission: Decrease number of caregivers, Bowel and bladder program and Patient/family education   Possible need for SNF placement upon discharge: Yes.  However, family are currently working on caregivers for home after discharge from inpatient rehab.   Patient Condition: This patient's condition remains as documented in the Consult dated 02/19/12, in which the Rehabilitation Physician determined and documented that the patient's condition is appropriate for intensive rehabilitative care in an inpatient rehabilitation  facility.  The rehab consult is more that 48 hrs old.  I have reviewed all progress and have shared with rehab MD.  Patient remains appropriate for acute inpatient rehab admission.  Preadmission Screen Completed By:  Trish Mage, 02/25/2012 1:14 PM ______________________________________________________________________   Discussed status with Dr. Riley Kill on 02/25/12 at 1402 and received telephone approval for admission today.  Admission Coordinator:  Trish Mage, time1402/Date02/03/14

## 2012-02-25 NOTE — Consult Note (Addendum)
Wound care follow-up: Vac dressing changed to left chest.  Pt medicated for pain prior to procedure and tolerated well with minimal discomfort. Surgeon in to assess wound.  100% beefy red slowly decreasing in size and depth.  One piece white foam tucked into wound bed and one piece black foam applied to cont suction. Mod pink drainage in cannister, no odor. Plan dressing on Wed.  Cammie Mcgee, RN, MSN, [employer]  [phone]

## 2012-02-25 NOTE — Progress Notes (Signed)
Rehab admissions - I have approval for inpatient rehab admission from insurance carrier for today.  Bed available and can admit to acute inpatient rehab today.  Call me for questions.  #[phone]

## 2012-02-25 NOTE — Progress Notes (Signed)
Subjective: Patient reports continued progress  Objective: Vital signs in last 24 hours: Temp:  [97.9 F (36.6 C)-98.4 F (36.9 C)] 98.3 F (36.8 C) (02/03 0358) Pulse Rate:  [78-88] 86  (02/03 0759) Resp:  [17-23] 23  (02/03 0759) BP: (129-155)/(65-76) 142/76 mmHg (02/03 0759) SpO2:  [96 %-100 %] 96 % (02/03 0759) Weight:  [76.2 kg (167 lb 15.9 oz)] 76.2 kg (167 lb 15.9 oz) (02/03 0400)  Intake/Output from previous day: 02/02 0701 - 02/03 0700 In: 750 [IV Piggyback:750] Out: 3825 [Urine:3800; Drains:25] Intake/Output this shift: Total I/O In: 240 [P.O.:240] Out: -   Physical Exam: Greater than antigravity strength both arms and legs.  Significant improvement in hand function bilaterally.  Lab Results:  Basename 02/24/12 0402 02/23/12 0500  WBC 9.6 10.8*  HGB 9.3* 9.0*  HCT 27.9* 26.9*  PLT 615* 587*   BMET  Basename 02/24/12 0402 02/23/12 0500  NA 135 134*  K 3.4* 3.4*  CL 105 104  CO2 22 22  GLUCOSE 115* 118*  BUN 9 11  CREATININE 0.49* 0.56  CALCIUM 8.0* 7.9*    Studies/Results: No results found.  Assessment/Plan: To Rehab today.    LOS: 17 days    Dorian Heckle, MD 02/25/2012, 8:35 AM

## 2012-02-25 NOTE — Progress Notes (Signed)
Assessment: 89 YOM with recently discovered LUL/chest wall mass, admitted 02/08/12 with severe chest wall pain and hypoxemia, suspected to have PNA. S/p I&D of chest wall abscess, drainage, and thoracotomy.   AC: new DVT 1/23. Heparin drip started but stopped for ?bleed but MRI neg bleed. Per Dr. Delton Coombes --> hold AC. S/p emergent spinal decompression surgery 1/23 for acute quadraplegia. Now on therapeutic lovenox. Later plan Xarelto or warf  ID: Vanc x 6 wks for MRSA bacteremia and diffuse spinal infxn on MRI, s/p I&D of chest wall abscess drainage, thoracotomy, and VAC placement with chest tube 1/19, more debridement 1/20. Frustrated with weakness due to paralysis. Plan 6 wks of vanc from 1/23(date of last OR)->3/6. [employer], WBC 9.6 on 2/2  MRSA PCR 1/17 >> POS Resp 1/17 >> MRSA  Blood 1/17 >> MRSA  1/18 bcx x2>> negative 1/19 L chest abscess>> abundant MRSA  1/23 L neck abscess>> few MRSA  1/21 VT = 13.5 (on 1gm q8h) 1/24 VT = 17.4 on 1250 q8h 1/27 VT = 17.7 on 1250mg  q8h 2/3 VT = 24.2 on 1250 mg q8h ->chg to q12h  Tamiflu 1/17 >> 1/19 Zosyn 1/17 >> 1/20 Vanc 1/17 >>  Gent 1/24 >> 1/27 (trough was therapeutic at 0.6)  CV: hx HTN - 172/87, TEE neg vegetations. TCTS following for wound vac of chest wall abscess. On norvasc now   Endo: no hx - CBGs controlled.   GI / Nutrition: AST/ALT mildly elevated, alk phos/tbili WNL - on full liquid diet, bisacodyl  Neuro: acute quadriplegia 2/2 spinal infxn s/p cervical laminectomy; slow improvement in UE movements. Complained of more neck discomfort today.  Renal: SCr 0.53, about the same. K+ 4.0, UOP 3.7->2.9->1.9 ml/kg/hr. Adjust vanc to q12h today  Pulm: extubated 1/26; now on RA - on albu/atrovent nebs. 100%  HemOnc: hgb low but stable, thrombycytosis. No CBC 2/3  PTA Med Issues: lisinopril, zocor, MS contin  Best Practices: SCDs  Plan:  - Chg Vanc 1250mg  IV Q12h - Monitor renal fxn, clinical course, weekly trough, but next as ss (2/6  or 2/7) - Lovenox 80 mg bid - to rehab today

## 2012-02-25 NOTE — Discharge Summary (Signed)
Physician Discharge Summary  Frank Brock ZOX:[id number] DOB: Apr 11, 1960 DOA: 02/08/2012  PCP: [name], MD  Admit date: 02/08/2012 Discharge date: 02/25/2012  Time spent: 40 minutes  Recommendations for Outpatient Follow-up: aggressive rehabilitative therapy at [facility]  Discharge Diagnoses:   Sepsis  Bacteremia due to methicillin resistant Staphylococcus aureus  Quadriplegia  Paraspinal epidural abscess  Acute respiratory failure with hypoxia  Chest wall abscess  Lung abscess  S/P VATS (1/19)  DVT of upper extremity (deep vein thrombosis)  Hypokalemia  H/O hypercholesteremia  H/O stroke  Anemia  Acute hyperglycemia  Discharge Condition: Stable  Diet recommendation: Heart healthy  Filed Weights   02/22/12 0447 02/24/12 0400 02/25/12 0400  Weight: 81.5 kg (179 lb 10.8 oz) 76.8 kg (169 lb 5 oz) 76.2 kg (167 lb 15.9 oz)    History of present illness:  51 year old male patient with tobacco abuse presented to a local urgent care with complaints of left shoulder pain at which time a CXR was unremarkable. Because of persistent back pain went to a chiropractor and repeat CXR ? Mass in upper lung. CT of the chest on 02/04/12 demonstrated left upper lobe soft tissue mass and ?? Metastatic lesions. This was associated with leukocytosis.He was sent out with pain medications. He was subsequently evaluated by hematology/oncology on 02/06/2012 (Dr. [name]) where plans were to proceed with a staging workup including a PET scan as well as MRI of the brain to be done as soon as possible; plans were also to refer the patient to interventional radiology for consideration of CT guided needle aspiration of the left upper lobe lung mass for tissue diagnosis.   Patient returned back to the emergency department on 02/08/2012 with hypoxemic respiratory failure. He was admitted by pulmonary critical care medicine. During the hospitalization his respiratory status worsened and  followup x-ray revealed bilateral pulmonary infiltrates. CT of the chest also revealed a large left-sided chest wall abscess that communicated with an intrathoracic pulmonary abscess. Also interval development of rapidly progressive bilateral pulmonary infiltrates c/w infiltrates and septic emboli. Thoracic surgery consultation was obtained and patient was urgently transferred to [facility] for emergency surgery for chest wall abscess presumed staphylococcal etiology. He subsequently underwent incision and drainage of the chest wall abscess and mini-thoracotomy with placement of wound VAC and insertion of left chest tube on 02/10/2012.   Subsequent blood cultures were positive for methicillin-resistant Staphylococcus aureus. Infectious disease service was consulted. Central line placed at time of admission was removed per ID recommendations. 2-D echocardiogram did not demonstrate evidence of vegetation. He underwent a TEE on 02/14/2012 that revealed no vegetations or atrial thrombus.   On 02/13/2012 patient was found to have a left upper extremity DVT involving the subclavian axillary vein and was started on heparin. This was later stopped after neurological exam revealed patient to be quadriplegic when sedation was weaned off. Neurology initially evaluated the patient. An MRI was ordered that revealed an epidural compressive lesion in the cervical spinal cord with edema. This prompted an urgent neurosurgical consultation. Patient subsequently underwent posterior cervical laminectomy for an epidural abscess at C3-3-C 7 on 02/14/2012. In the past 24 hours patient has regained very weak gross motor activity in the upper and lower extremities.   From a respiratory standpoint he was able to be extubated on 02/17/2012 and is stable on nasal cannula oxygen. He continues on vancomycin and gentamicin for widely disseminated MRSA infections as noted above. Plans are to removehis chest tubes soon. Wound VAC  remains in  place. PT and OT are continued to work with the patient and when medically stable neurosurgery recommends an inpatient rehabilitation.   Hospital Course:   Sepsis Due to widely disseminated MRSA infection. Has remained hemodynamically stable for more than 7 days.  Bacteremia due to methicillin resistant Staphylococcus aureus White count has normalized. TEE completed this at admission reveals no evidence of vegetation or valvular endocarditis.  Infectious disease service has followed during the hospitalization. Their recommendation is to continue vancomycin for a total of 6 weeks from the date of the last I&D procedure which was completed on 02/14/2012.  Quadriplegia/Paraspinal epidural abscess Neurosurgery has been following and he is post cervical laminectomy on 02/14/12. Symptoms primarily felt to be related to a combination of acute spinal cord infarction in setting of spinal abscess with cultures positive for MRSA. Patient has been followed aggressively by physical therapy and occupational therapy. He continues to require soft cervical collar which does not have to be worn continuously. He has slowly been having increases in motor function each day with marked improvements over the past 72 hours. He does remain somewhat deconditioned but at this time appears appropriate for inpatient rehabilitation.  Acute respiratory failure with hypoxia:   Chest wall abscess/Lung abscess--S/P VATS (1/19) Pulmonary abscess seems to be primary etiology to the disseminated MRSA infection. Thoracic surgery has been following. All chest tubes have been discontinued without any progression of pulmonary symptoms. Serial chest x-rays have continued to show daily improvement. Supportive care continues and patient currently stable off of oxygen with room air saturations between 96 and 100%. Continue wound VAC to left anterior chest wall surgical site. Last VAC change was 02/25/2012 with clean wound bed and 100%  beefy base.  DVT of upper extremity (02/13/12) After admission patient was found to have upper extremity DVT involving primarily the left subclavian axillary veins. Anticoagulation had been stopped once patient was found to have acute quadriplegia which was later determined to be related to epidural abscess and not related to a hematoma. Subsequently after patient had been stable postoperatively from epidural abscess surgery anticoagulation with Lovenox was resumed on 02/20/2012. Patient has had no signs of neurologic compromise related to initiation of anticoagulation therefore no suspicion he has developed untoward bleeding from anticoagulation. Will eventually need pharmacy to transition patient over to warfarin or Xarelto to complete a 72month tx period total.  Anemia of critical illness Baseline hemoglobin 13. Patient has received 2 units of packed red blood cells this admission. Last hemoglobin documented at 9.3 on 02/24/2012.  Procedures: Irrigation and debridement of left chest wall abscess with minithoracotomy, placement of left chest tube and wound VAC (02/10/2012)   Reexploration of left chest debridement site with further wound debridement and placement of wound VAC (02/12/2012)   Posterior cervical laminectomy for epidural abscess C3-C7 (02/14/2012)   TEE (02/14/2012):  Study Conclusions - Left ventricle: Systolic function was normal. The estimated ejection fraction was in the range of 60% to 65%. Wall motion was normal; there were no regional wall motion abnormalities. - Left atrium: No evidence of thrombus in the atrial cavity or appendage. Impressions: - No vegetations.  2-D echocardiogram: (02/11/12):  Study Conclusions - Left ventricle: The cavity size was normal. Wall thickness was normal. The estimated ejection fraction was 55%. Images were inadequate for LV wall motion assessment. Features are consistent with a pseudonormal left ventricular filling pattern, with  concomitant abnormal relaxation and increased filling pressure (grade 2 diastolic dysfunction). - Aortic valve: Poorly visualized. Probably trileaflet. There was  no stenosis. - Mitral valve: No significant regurgitation. - Left atrium: The atrium was mildly dilated. - Right ventricle: The cavity size was normal. Systolic function was normal. - Pulmonary arteries: No complete TR doppler jet so unable to estimate PA systolic pressure. - Inferior vena cava: The vessel was normal in size; the respirophasic diameter changes were in the normal range (= 50%); findings are consistent with normal central venous pressure. Impressions: - Technically difficult study with very poor acoustic windows.Normal LV size with EF 55%. Unable to comment on individual wall segments due to poor images. Normal RV size and systolic function. No significant valvular abnormalities noted.   Left upper extremity venous duplex (02/13/12): Findings consistent with deep vein thrombosis involving the left subclavian vein and left axillary vein.   Consultations: Cardiothoracic Surgery  Infectious Disease  Neurology  Neurosurgery  Discharge Exam: Filed Vitals:   02/25/12 0400 02/25/12 0759 02/25/12 0924 02/25/12 1115  BP:  142/76    Pulse:  86    Temp:    98 F (36.7 C)  TempSrc:  Oral  Oral  Resp:  23    Height:      Weight: 76.2 kg (167 lb 15.9 oz)     SpO2:  96% 98%    General: No acute respiratory distress  Lungs: Coarse but clear to auscultation bilaterally without wheezes or crackles  Cardiovascular: Regular rate and rhythm without murmur gallop or rub normal S1 and S2, no JVD and no peripheral edema  Abdomen: Nontender, nondistended, soft, bowel sounds positive, no rebound, no ascites, no appreciable mass  Genitourinary: Foley catheter in place with light amber urine  Musculoskeletal: No significant cyanosis, clubbing of bilateral lower extremities; offloading boots to bilateral lower extremities   Neurological: Alert and oriented x3, has very limited gross motor movement of upper and lower extremities as follows:  LUE 2/5, RUE 2/5 in forearm- not much movement at shoulder  LLE 2+/5, RLE 2-/5-sensation present in extremities mainly expressed as numbness--today noted with ability to lift left leg off the bed weekly and hold briefly  Discharge Instructions      Discharge Orders    Future Orders Please Complete By Expires   Diet - low sodium heart healthy      Increase activity slowly      Scheduling Instructions:   Continue PT/OT   Discharge wound care:      Comments:   Continue wound VAC to anterior chest wall       Medication List     As of 02/25/2012  1:32 PM    STOP taking these medications         aspirin 81 MG tablet      lisinopril 20 MG tablet   Commonly known as: PRINIVIL,ZESTRIL      morphine 30 MG 12 hr tablet   Commonly known as: MS CONTIN      multivitamin tablet      oxyCODONE-acetaminophen 5-325 MG per tablet   Commonly known as: PERCOCET/ROXICET      simvastatin 40 MG tablet   Commonly known as: ZOCOR      TAKE these medications         acetaminophen 325 MG tablet   Commonly known as: TYLENOL   Take 2 tablets (650 mg total) by mouth every 6 (six) hours as needed for fever.      albuterol (5 MG/ML) 0.5% nebulizer solution   Commonly known as: PROVENTIL   Take 0.5 mLs (2.5 mg total) by nebulization every  3 (three) hours as needed for wheezing or shortness of breath.      albuterol (5 MG/ML) 0.5% nebulizer solution   Commonly known as: PROVENTIL   Take 0.5 mLs (2.5 mg total) by nebulization 3 (three) times daily.      amLODipine 10 MG tablet   Commonly known as: NORVASC   Take 1 tablet (10 mg total) by mouth daily.      antiseptic oral rinse Liqd   15 mLs by Mouth Rinse route QID.      bisacodyl 5 MG EC tablet   Commonly known as: DULCOLAX   Take 2 tablets (10 mg total) by mouth daily.      chlorhexidine 0.12 % solution   Commonly  known as: PERIDEX   Use as directed 15 mLs in the mouth or throat 2 (two) times daily.      diphenhydrAMINE 50 MG capsule   Commonly known as: BENADRYL   Take 1 capsule (50 mg total) by mouth every 6 (six) hours as needed for itching. As needed for itching      enoxaparin 80 MG/0.8ML injection   Commonly known as: LOVENOX   Inject 0.8 mLs (80 mg total) into the skin every 12 (twelve) hours.      feeding supplement Liqd   Take 237 mLs by mouth 2 (two) times daily between meals.      feeding supplement Liqd   Take 30 mLs by mouth 2 (two) times daily.      ipratropium 0.02 % nebulizer solution   Commonly known as: ATROVENT   Take 2.5 mLs (0.5 mg total) by nebulization 3 (three) times daily.      magic mouthwash Soln   Take 5 mLs by mouth 4 (four) times daily.      menthol-cetylpyridinium 3 MG lozenge   Commonly known as: CEPACOL   Take 1 lozenge (3 mg total) by mouth as needed (sore throat).      ondansetron 4 MG/2ML Soln injection   Commonly known as: ZOFRAN   Inject 2 mLs (4 mg total) into the vein every 4 (four) hours as needed for nausea or vomiting.      oxyCODONE 5 MG immediate release tablet   Commonly known as: Oxy IR/ROXICODONE   Take 1-2 tablets (5-10 mg total) by mouth every 4 (four) hours as needed.      polyethylene glycol packet   Commonly known as: MIRALAX / GLYCOLAX   Take 17 g by mouth daily.      potassium chloride SA 20 MEQ tablet   Commonly known as: K-DUR,KLOR-CON   Take 2 tablets (40 mEq total) by mouth 2 (two) times daily.      sodium chloride 0.9 % injection   10-40 mLs by Intracatheter route every 12 (twelve) hours.      sodium chloride 0.9 % injection   10-40 mLs by Intracatheter route as needed (flush).      sodium chloride 0.9 % SOLN 250 mL with vancomycin 10 G SOLR   Pharmacist to manage dosing      traMADol 50 MG tablet   Commonly known as: ULTRAM   Take 2 tablets (100 mg total) by mouth every 6 (six) hours as needed.      white  petrolatum Gel   Commonly known as: VASELINE   Apply 1 application topically as needed.         Antibiotics:  Osletamivir 1/17 >> 1/19  Zosyn 1/17 >> 1/20  Vanc 1/17 >>  Microbiology: MRSA PCR 1/17 >> POSITIVE  Resp 1/17 >> MRSA  Urine 1/17 >> negative  Blood 1/17 >> 2/2 MRSA  Blood 1/18 >> neg  Abscess 1/19 >> MRSA  Labs: Basic Metabolic Panel:  Lab 02/25/12 4540 02/24/12 0402 02/23/12 0500 02/22/12 0500 02/20/12 0450  NA 134* 135 134* 135 138  K 4.0 3.4* 3.4* 3.1* 3.3*  CL 102 105 104 104 107  CO2 23 22 22 21 20   GLUCOSE 137* 115* 118* 112* 106*  BUN 10 9 11 9 18   CREATININE 0.53 0.49* 0.56 0.47* 0.58  CALCIUM 9.1 8.0* 7.9* 8.0* 7.9*  MG -- 1.8 -- -- --  PHOS -- -- -- -- --   Liver Function Tests:  Lab 02/20/12 0450  AST 75*  ALT 91*  ALKPHOS 105  BILITOT 0.6  PROT 5.8*  ALBUMIN 1.7*   CBC:  Lab 02/24/12 0402 02/23/12 0500 02/21/12 0500 02/20/12 0450 02/19/12 0445  WBC 9.6 10.8* 16.2* 17.1* 16.9*  NEUTROABS -- -- -- -- --  HGB 9.3* 9.0* 9.5* 9.5* 9.4*  HCT 27.9* 26.9* 28.3* 27.7* 27.8*  MCV 90.6 90.6 89.8 89.9 88.5  PLT 615* 587* 633* 608* 602*   Signed:  ELLIS,ALLISON L.ANP  [facility] 02/25/2012, 1:32 PM  I have personally examined this patient and reviewed the entire database. I have reviewed the above note, made any necessary editorial changes, and agree with its content.  Lonia Blood, MD

## 2012-02-26 ENCOUNTER — Inpatient Hospital Stay (HOSPITAL_COMMUNITY): Payer: Medicare HMO | Admitting: Occupational Therapy

## 2012-02-26 ENCOUNTER — Inpatient Hospital Stay (HOSPITAL_COMMUNITY): Payer: Medicare HMO

## 2012-02-26 DIAGNOSIS — L89109 Pressure ulcer of unspecified part of back, unspecified stage: Secondary | ICD-10-CM

## 2012-02-26 LAB — COMPREHENSIVE METABOLIC PANEL
ALT: 99 U/L — ABNORMAL HIGH (ref 0–53)
Alkaline Phosphatase: 98 U/L (ref 39–117)
CO2: 21 mEq/L (ref 19–32)
Calcium: 9.1 mg/dL (ref 8.4–10.5)
GFR calc Af Amer: >90 mL/min (ref >90)
GFR calc non Af Amer: >90 mL/min (ref >90)
Glucose, Bld: 113 mg/dL — ABNORMAL HIGH (ref 70–99)
Sodium: 133 mEq/L — ABNORMAL LOW (ref 135–145)

## 2012-02-26 LAB — CBC WITH DIFFERENTIAL/PLATELET
Eosinophils Relative: 4 % (ref 0–5)
HCT: 30.6 % — ABNORMAL LOW (ref 39.0–52.0)
Lymphocytes Relative: 13 % (ref 12–46)
Lymphs Abs: 1.4 10*3/uL (ref 0.7–4.0)
MCV: 89.7 fL (ref 78.0–100.0)
Platelets: 601 10*3/uL — ABNORMAL HIGH (ref 150–400)
RBC: 3.41 MIL/uL — ABNORMAL LOW (ref 4.22–5.81)
WBC: 10.7 10*3/uL — ABNORMAL HIGH (ref 4.0–10.5)

## 2012-02-26 MED ORDER — BISACODYL 10 MG RE SUPP
10.0000 mg | Freq: Every day | RECTAL | Status: DC
  Administered 2012-02-27 – 2012-03-04 (×6): 10 mg via RECTAL
  Filled 2012-02-26 (×8): qty 1

## 2012-02-26 MED ORDER — SODIUM CHLORIDE 0.9 % IJ SOLN
10.0000 mL | INTRAMUSCULAR | Status: DC | PRN
  Administered 2012-02-26 – 2012-02-27 (×2): 10 mL
  Administered 2012-02-28: 20 mL
  Administered 2012-02-28 – 2012-03-01 (×3): 10 mL
  Administered 2012-03-01: 20 mL
  Administered 2012-03-01: 10 mL
  Administered 2012-03-02: 20 mL
  Administered 2012-03-02: 10 mL
  Administered 2012-03-03: 30 mL
  Administered 2012-03-03: 20 mL
  Administered 2012-03-06 – 2012-03-10 (×4): 10 mL
  Administered 2012-03-11: 20 mL
  Administered 2012-03-12 – 2012-03-21 (×18): 10 mL

## 2012-02-26 NOTE — Evaluation (Signed)
Occupational Therapy Assessment and Plan & Session Note  Patient Details  Name: Frank Brock MRN: [id number] Date of Birth: 29-May-1960  OT Diagnosis: abnormal posture, acute pain, muscle weakness (generalized) and quadriparesis at level C2-T1 Rehab Potential: Rehab Potential: Good ELOS: ~4 weeks   Today's Date: 02/26/2012  Problem List:  Patient Active Problem List  Diagnosis  . H/O HTN  . H/O hypercholesteremia  . H/O stroke  . Acute respiratory failure with hypoxia  . Hypoxemia  . Pain, chest wall  . Chest wall abscess  . Lung abscess  . Bacteremia due to methicillin resistant Staphylococcus aureus  . S/P VATS (1/19)  . DVT of upper extremity (deep vein thrombosis)  . Quadriplegia  . Sepsis  . Paraspinal epidural abscess  . Hypokalemia  . Anemia  . Acute hyperglycemia    Past Medical History:  Past Medical History  Diagnosis Date  . Hypercholesteremia 02/06/2012  . Stroke 02/06/2012    09/13 secondary to hypertensive crisis.   Past Surgical History:  Past Surgical History  Procedure Date  . Inguinal hernia repair   . Irrigation and debridement abscess 02/10/2012    Procedure: IRRIGATION AND DEBRIDEMENT ABSCESS;  Surgeon: Delight Ovens, MD;  Location: [facility] OR;  Service: Thoracic;  Laterality: Left;  . Thoracotomy 02/10/2012    Procedure: MINI/LIMITED THORACOTOMY;  Surgeon: Delight Ovens, MD;  Location: [facility] OR;  Service: Thoracic;  Laterality: Left;  . Chest exploration 02/11/2012    Procedure: CHEST EXPLORATION;  Surgeon: Delight Ovens, MD;  Location: [facility] OR;  Service: Thoracic;  Laterality: Left;  re-exploration of left chest wall; with wound vac change  . Minor application of wound vac 02/11/2012    Procedure: MINOR APPLICATION OF WOUND VAC;  Surgeon: Delight Ovens, MD;  Location: [facility] OR;  Service: Thoracic;  Laterality: Left;  wound vac change  . Posterior cervical laminectomy for epidural abscess 02/14/2012    Procedure: POSTERIOR CERVICAL LAMINECTOMY  FOR EPIDURAL ABSCESS;  Surgeon: Maeola Harman, MD;  Location: [facility]UR[facility] ORS;  Service: Neurosurgery;  Laterality: N/A;  Cervical Laminectomy  for Epidural Abscess    Clinical Impression: Frank Brock is a 52 y.o. male With hx of prior CVA, HTN hyperlipidemia, smoking, heavy alcohol use who developed small lesion on middle finger of L hand around 1/03. Developed severe upper L chest pain 1/08 and seen by primary MD 1/09. CXR normal @ that time. 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 Seen by Onc 1/15 and plans for eval of presumed lung cancer. Admitted via ED 1/17 with severe chest pain, hypoxemia, presumed PNA and continued presumption of LUL malignancy. Blood cultures drawn, abx started. Blood cx's turned positive on 1/18 ultimately growing MRSA in 2/2. In the interim he had deteriorated and CT showed suspected to was infact a large left-sided chest wall abscess communicating with the adjacent intrathoracic/lung abscess. He was transported emergently from [facility] for emergent cardiothoracic surgery and underwent exploration of chest wound, minithoracotomy evacuation of chest wall abscess and intrapleural abscess with placement of chest tube and a wound vacuum dressing on 01/19 by Dr. Tyrone Sage.   ID consulted and recommended TEE for full workup. TEE revealed EF 60-65% and no thrombus. On 01/22 LUE doppler done revealing DVT in subclavian and axillary vein and patient started on heparin for treatment. On 01/23 patient was noted to have to be quadriparetic and Dr. Amada Jupiter consulted for  input. MRI brain and spine done revealing remote infarcts and diffuse infection surrounding cervical and upper thoracic spine involving vertebra, soft tissue and with extension into epidural space causing cord compression C2-T1 and Question subtle enhancement of nerve roots in lumbar spine. Dr. Venetia Maxon consulted for input and  patient underwent posterior cervical laminectomy for evacuation of abscess on the same day. Vean wean initiated and patient tolerated extubation on 02/17/12. ID recommends continuing antibiotics for 6 weeks from the date of his I & D.   VAC dressing changes ongoing MWF. Patient did have a drop in H/H ot 7.5 and heparin discontinued on 01/25. He was changed over to treatment dose lovenox once H/H stabilized. He has had some improvement in UE strength. Therapy ongoing and working on core activation at EOB and ROM for edema control BUE. He's showing improvement in endurance but continues to be limited by neck pain when up. Intermittent transient asymptomatic bradycardia noted with HR down to 30s. Leucocytosis resolving. Therapy team recommended CIR and patient admitted today for further therapies. Patient transferred to CIR on 02/25/2012 .    Patient currently requires total with basic self-care skills secondary to muscle weakness, muscle joint tightness and muscle paralysis and decreased sitting balance, decreased standing balance, decreased postural control and decreased balance strategies.  Prior to hospitalization, patient could complete ADLs & IADLs independently.   Patient will benefit from skilled intervention to increase independence with basic self-care skills prior to discharge home with family.  Anticipate patient will require 24 hour supervision and minimal physical assistance and follow up home health.  OT - End of Session Activity Tolerance: Tolerates 10 - 20 min activity with multiple rests Endurance Deficit: Yes OT Assessment Rehab Potential: Good Barriers to Discharge: None (none known at this time) OT Plan OT Intensity: Minimum of 1-2 x/day, 45 to 90 minutes OT Frequency: 5 out of 7 days OT Duration/Estimated Length of Stay: ~4 weeks OT Treatment/Interventions: Balance/vestibular training;Community reintegration;Discharge planning;DME/adaptive equipment instruction;Functional  electrical stimulation;Functional mobility training;Neuromuscular re-education;Pain management;Patient/family education;Psychosocial support;Self Care/advanced ADL retraining;Skin care/wound managment;Splinting/orthotics;Therapeutic Activities;Therapeutic Exercise;UE/LE Strength taining/ROM;UE/LE Coordination activities;Wheelchair propulsion/positioning;Disease mangement/prevention OT Recommendation Patient destination: Home Follow Up Recommendations: Home health OT Equipment Recommended: 3 in 1 bedside comode;Tub/shower bench  Precautions/Restrictions  Precautions Precautions: Cervical;Fall Required Braces or Orthoses: Cervical Brace Cervical Brace: Soft collar Restrictions Weight Bearing Restrictions: No  General Chart Reviewed: Yes Family/Caregiver Present: No  Vital Signs Oxygen Therapy O2 Device: None (Room air)  Pain Pain Assessment Pain Assessment: 0-10 Pain Score:   6 Faces Pain Scale: Hurts even more Pain Type: Acute pain Pain Location: Rectum Pain Descriptors: Discomfort Pain Onset: On-going Pain Intervention(s): RN made aware;Repositioned  Home Living/Prior Functioning Home Living Lives With: Family (wife & daughter occassionally, mother at other times) Available Help at Discharge: Family (wife and mother) Type of Home: House Home Access: Stairs to enter Secretary/administrator of Steps: 2 Home Layout: One level Bathroom Shower/Tub: Tub/shower unit;Curtain Firefighter: Standard Additional Comments: patient states his step-dad works in [employer] and can assist with home modifications prn IADL History Homemaking Responsibilities: No Current License: Yes 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 Prior Function Level of Independence: Independent with basic ADLs;Independent with homemaking with ambulation;Independent with gait;Independent with transfers Driving: Yes Comments: patient states he was laid off in September after his  stroke  ADL - See FIM  Vision/Perception  Vision - History Baseline Vision: No visual deficits Vision - Assessment Vision Assessment: Vision not tested Perception Perception: Within  Functional Limits Praxis Praxis: Intact   Cognition Overall Cognitive Status: Appears within functional limits for tasks assessed Arousal/Alertness: Awake/alert Orientation Level: Oriented X4 Memory: Appears intact Awareness: Appears intact Problem Solving: Appears intact Safety/Judgment: Appears intact  Sensation Sensation Light Touch: Impaired by gross assessment Stereognosis: Impaired by gross assessment Hot/Cold: Impaired by gross assessment Proprioception: Impaired by gross assessment Additional Comments: 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, numbness and tinling in bilateral hands  Coordination Gross Motor Movements are Fluid and Coordinated: No Fine Motor Movements are Fluid and Coordinated: No Coordination and Movement Description: decreased gross and fine motor coordination and fluid movements  Motor - See Evaluation Navigator  Mobility - See Evaluation Navigator  Trunk/Postural Assessment - See Evaluation Navigator  Balance- See Evaluation Navigator  Extremity/Trunk Assessment RUE Assessment RUE Assessment: Exceptions to [facility] RUE AROM (degrees) RUE Overall AROM Comments: Decreased overall AROM thorughout RUE. Able to flex shoulder ~90*, elbow flexion & extension AROM is WFL, wrist flexion & extension AROM is WFL RUE PROM (degrees) RUE Overall PROM Comments: PROM is [facility] RUE Strength RUE Overall Strength Comments: Decreased strength throughout. 3-/5 shoulder strength, 3+/5 elbow strength LUE Assessment LUE Assessment: Exceptions to WFL LUE AROM (degrees) LUE Overall AROM Comments: decreased overall AROM -> left UE. Patient unable to actively flex shoulder. Patient able to perform elbow flexion & extension against gravity, wrist flexion & extension against gravity LUE PROM  (degrees) LUE Overall PROM Comments: PROM is WFL LUE Strength LUE Overall Strength Comments: 2-/5 shoulder strength, 3/5 elbow strength  FIM:  FIM - Eating Eating Activity: 0: Activity did not occur FIM - Grooming Grooming: 0: Activity did not occur FIM - Bathing Bathing: 0: Activity did not occur FIM - Upper Body Dressing/Undressing Upper body dressing/undressing: 0: Wears gown/pajamas-no public clothing (no public clothing; gown) FIM - Lower Body Dressing/Undressing Lower body dressing/undressing: 0: Wears Oceanographer (no public clothing; gown) FIM - Toileting Toileting: 0: Activity did not occur FIM - Banker Devices: [facility] elevated;Sliding board Bed/Chair Transfer: 2: Supine > Sit: Max A (lifting assist/Pt. 25-49%);2: Sit > Supine: Max A (lifting assist/Pt. 25-49%);2: Bed > Chair or W/C: Max A (lift and lower assist);2: Chair or W/C > Bed: Max A (lift and lower assist);1: Two helpers FIM - Archivist Transfers: 0-Activity did not occur FIM - IT consultant Transfers: 0-Activity did not occur or was simulated   Refer to Care Plan for Long Term Goals  Recommendations for other services: None  Discharge Criteria: Patient will be discharged from OT if patient refuses treatment 3 consecutive times without medical reason, if treatment goals not met, if there is a change in medical status, if patient makes no progress towards goals or if patient is discharged from hospital.  The above assessment, treatment plan, treatment alternatives and goals were discussed and mutually agreed upon: by patient  --------------------------------------------------------------------------------------------  SESSION NOTE  0915-1000 - 45 Minutes Individual Therapy Patient with 6/10 complaints of pain in rectum; RN aware Upon entering room respiratory therapist present with breathing treatment, therefore patient  missed 15 minutes of skilled occupational therapy time. Initial 1:1 occupational therapy evaluation completed. Focused skilled intervention on bed mobility (side rolling left <-> right), supine -> sit with maximal assistance from therapist, dynamic sitting balance/tolerance/endurance, overall activity tolerance/endurance, sit-> supine, and positioning in bed. At end of session left patient supine in bed with rehab tech present for next therapy session.    Tanea Moga 02/26/2012, 11:20 AM

## 2012-02-26 NOTE — Progress Notes (Signed)
Patient information reviewed and entered into eRehab system by Stefanos Haynesworth, RN, CRRN, PPS Coordinator.  Information including medical coding and functional independence measure will be reviewed and updated through discharge.     Per nursing patient was given "Data Collection Information Summary for Patients in Inpatient Rehabilitation Facilities with attached "Privacy Act Statement-Health Care Records" upon admission.  

## 2012-02-26 NOTE — Progress Notes (Signed)
Occupational Therapy Session Note  Patient Details  Name: Frank Brock MRN: [id number] Date of Birth: 05-25-1960  Today's Date: 02/26/2012 Time: 1430-1456 Time Calculation (min): 26 min  Short Term Goals: Week 1:  OT Short Term Goal 1 (Week 1): Patient will complete UB dressing sitting edge of bed with moderate assistance OT Short Term Goal 2 (Week 1): Patient will complete LB dressing in supine position with maximal assistance OT Short Term Goal 3 (Week 1): Patient will perform grooming tasks seated at sink with miminal assistance OT Short Term Goal 4 (Week 1): BSC will be introduced to patient for bowel program with nursing  Skilled Therapeutic Interventions/Progress Updates:    Pt seen for 1:1 OT with focus on bed mobility, static sitting, and BUE ROM in unsupported sitting.  Pt in bed on Rt side upon arrival, reports sore on buttocks, but willing to attempt sitting EOB.  Pt required max assist sidelying to sit and min assist to maintain upright sitting balance with simple ROM exercises to increase challenge of trunk control.  Pt with LOB to Rt with max-total assist to regain.  Pt reports need to return to bed "too tired" at end of day.  Discussed pt's goals for rehab stay and OT goals.  Pt reports noticing "small victories" each day and appeared upbeat despite current status.  Therapy Documentation Precautions:  Precautions Precautions: Cervical;Fall Required Braces or Orthoses: Cervical Brace Cervical Brace: Soft collar Restrictions Weight Bearing Restrictions: No Pain: Pain Assessment Pain Assessment: 0-10 Pain Score:   8 Pain Type: Acute pain Pain Location: Rectum Pain Frequency: Intermittent Pain Onset: On-going Pain Intervention(s): Medication (See eMAR)  See FIM for current functional status  Therapy/Group: Individual Therapy  Leonette Monarch 02/26/2012, 3:29 PM

## 2012-02-26 NOTE — Evaluation (Signed)
Physical Therapy Assessment and Plan  Patient Details  Name: Frank Brock MRN: [id number] Date of Birth: 06-Mar-1960  PT Diagnosis: Abnormal posture, Difficulty walking, Impaired sensation, Low back pain, Muscle weakness, Pain in joint and Quadriplegia Rehab Potential: Good ELOS: 3-4 weeks   Today's Date: 02/26/2012 Time: 1000-1105 Time Calculation (min): 65 min  Problem List:  Patient Active Problem List  Diagnosis  . H/O HTN  . H/O hypercholesteremia  . H/O stroke  . Acute respiratory failure with hypoxia  . Hypoxemia  . Pain, chest wall  . Chest wall abscess  . Lung abscess  . Bacteremia due to methicillin resistant Staphylococcus aureus  . S/P VATS (1/19)  . DVT of upper extremity (deep vein thrombosis)  . Quadriplegia  . Sepsis  . Paraspinal epidural abscess  . Hypokalemia  . Anemia  . Acute hyperglycemia    Past Medical History:  Past Medical History  Diagnosis Date  . Hypercholesteremia 02/06/2012  . Stroke 02/06/2012    09/13 secondary to hypertensive crisis.   Past Surgical History:  Past Surgical History  Procedure Date  . Inguinal hernia repair   . Irrigation and debridement abscess 02/10/2012    Procedure: IRRIGATION AND DEBRIDEMENT ABSCESS;  Surgeon: Delight Ovens, MD;  Location: [facility] OR;  Service: Thoracic;  Laterality: Left;  . Thoracotomy 02/10/2012    Procedure: MINI/LIMITED THORACOTOMY;  Surgeon: Delight Ovens, MD;  Location: [facility] OR;  Service: Thoracic;  Laterality: Left;  . Chest exploration 02/11/2012    Procedure: CHEST EXPLORATION;  Surgeon: Delight Ovens, MD;  Location: [facility] OR;  Service: Thoracic;  Laterality: Left;  re-exploration of left chest wall; with wound vac change  . Minor application of wound vac 02/11/2012    Procedure: MINOR APPLICATION OF WOUND VAC;  Surgeon: Delight Ovens, MD;  Location: [facility] OR;  Service: Thoracic;  Laterality: Left;  wound vac change  . Posterior cervical laminectomy for epidural abscess 02/14/2012     Procedure: POSTERIOR CERVICAL LAMINECTOMY FOR EPIDURAL ABSCESS;  Surgeon: Maeola Harman, MD;  Location: [facility] NEURO ORS;  Service: Neurosurgery;  Laterality: N/A;  Cervical Laminectomy  for Epidural Abscess    Assessment & Plan Clinical Impression: Patient is a 52 y.o. year old male with recent admission to the hospital With hx of prior CVA, HTN hyperlipidemia, smoking, heavy alcohol use who developed small lesion on middle finger of L hand around 1/03. Developed severe upper L chest pain 1/08 and seen by primary MD 1/09. CXR normal @ that time. 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 Seen by Onc 1/15 and plans for eval of presumed lung cancer. Admitted via ED 1/17 with severe chest pain, hypoxemia, presumed PNA and continued presumption of LUL malignancy. Blood cultures drawn, abx started. Blood cx's turned positive on 1/18 ultimately growing MRSA in 2/2. In the interim he had deteriorated and CT showed suspected to was infact a large left-sided chest wall abscess communicating with the adjacent intrathoracic/lung abscess. He was transported emergently from [facility] for emergent cardiothoracic surgery and underwent exploration of chest wound, minithoracotomy evacuation of chest wall abscess and intrapleural abscess with placement of chest tube and a wound vacuum dressing on 01/19 by Dr. Tyrone Sage.  ID consulted and recommended TEE for full workup. TEE revealed EF 60-65% and no thrombus. On 01/22 LUE doppler done revealing DVT in subclavian and axillary vein and patient started on heparin for treatment. On  01/23 patient was noted to have to be quadriparetic and Dr. Amada Jupiter consulted for input. MRI brain and spine done revealing remote infarcts and diffuse infection surrounding cervical and upper thoracic spine involving vertebra, soft tissue and with extension into epidural space causing cord compression C2-T1 and  Question subtle enhancement of nerve roots in lumbar spine. Dr. Venetia Maxon consulted for input and patient underwent posterior cervical laminectomy for evacuation of abscess on the same day. Vean wean initiated and patient tolerated extubation on 02/17/12. ID recommends continuing antibiotics for 6 weeks from the date of his I & D.  VAC dressing changes ongoing MWF. Patient did have a drop in H/H ot 7.5 and heparin discontinued on 01/25. He was changed over to treatment dose lovenox once H/H stabilized. He has had some improvement in UE strength. Therapy ongoing and working on core activation at EOB and ROM for edema control BUE. He's showing improvement in endurance but continues to be limited by neck pain when up. Intermittent transient asymptomatic bradycardia noted with HR down to 30s. Leucocytosis resolving. Patient transferred to CIR on 02/25/2012 .   Patient currently requires total with mobility secondary to muscle weakness, decreased cardiorespiratoy endurance, unbalanced muscle activation and decreased coordination and decreased sitting balance, decreased postural control and decreased balance strategies.  Prior to hospitalization, patient was independent  with mobility and lived with Family in a House.  Home access is 2 (1 + 1 with platform) (family member is Surveyor, minerals and can build ramp)Stairs to enter.  Patient will benefit from skilled PT intervention to maximize safe functional mobility, minimize fall risk and decrease caregiver burden for planned discharge home with 24 hour assist.  Anticipate patient will benefit from follow up [facility] at discharge.  PT - End of Session Activity Tolerance: Decreased this session Endurance Deficit: Yes PT Assessment Rehab Potential: Good Barriers to Discharge: Inaccessible home environment;Decreased caregiver support (unclear how much assist family can provide; multipe caregive) PT Plan PT Intensity: Minimum of 1-2 x/day ,45 to 90 minutes PT Frequency: 5 out of 7  days PT Duration Estimated Length of Stay: 3-4 weeks PT Treatment/Interventions: Ambulation/gait training;Balance/vestibular training;Community reintegration;Discharge planning;Disease management/prevention;DME/adaptive equipment instruction;Functional electrical stimulation;Functional mobility training;Neuromuscular re-education;Pain management;Patient/family education;Psychosocial support;Skin care/wound management;Splinting/orthotics;Stair training;Therapeutic Activities;Therapeutic Exercise;UE/LE Strength taining/ROM;UE/LE Coordination activities;Wheelchair propulsion/positioning PT Recommendation Follow Up Recommendations: Home health PT;24 hour supervision/assistance Patient destination: Home Equipment Recommended: Wheelchair (measurements);Wheelchair cushion (measurements);Sliding board  Skilled Therapeutic Intervention   PT Evaluation Precautions/Restrictions Precautions Precautions: Cervical;Fall Required Braces or Orthoses: Cervical Brace Cervical Brace: Soft collar Restrictions Weight Bearing Restrictions: No Pain Reports pain in neck.   Home Living/Prior Functioning Home Living Lives With: Family Available Help at Discharge: Family (wife and mother) Type of Home: House Home Access: Stairs to enter Secretary/administrator of Steps: 2 (1 + 1 with platform) (family member is Surveyor, minerals and can build ramp) Home Layout: One level Bathroom Shower/Tub: Forensic psychologist: None Additional Comments: patient states his step-dad works in [employer] and can assist with home modifications prn Prior Function Level of Independence: Independent with basic ADLs;Independent with homemaking with ambulation;Independent with gait;Independent with transfers Driving: Yes Comments: patient states he was laid off in September after his stroke Vision/Perception  Vision - History Baseline Vision: No visual deficits Vision -  Assessment Vision Assessment: Vision not tested Perception Perception: Within Functional Limits Praxis Praxis: Intact  Cognition Overall Cognitive Status: Appears within functional limits for tasks assessed Arousal/Alertness: Awake/alert Orientation Level: Oriented X4 Memory: Appears intact Awareness: Appears intact Problem Solving:  Appears intact Safety/Judgment: Appears intact Comments: some decreased awareness noted with mobility Sensation Sensation Light Touch: Impaired by gross assessment Stereognosis: Impaired by gross assessment Hot/Cold: Impaired by gross assessment Proprioception: Impaired by gross assessment Additional Comments: 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, numbness and tinling in bilateral hands  Coordination Gross Motor Movements are Fluid and Coordinated: No Fine Motor Movements are Fluid and Coordinated: No Coordination and Movement Description: decreased gross and fine motor coordination and fluid movements Motor  Motor Motor: Abnormal postural alignment and control;Tetraplegia     Trunk/Postural Assessment  Cervical Assessment Cervical Assessment: Exceptions to [facility] (soft cervical collar) Thoracic Assessment Thoracic Assessment: Exceptions to [facility] (flexed posture) Lumbar Assessment Lumbar Assessment: Exceptions to [facility] Postural Control Postural Control: Deficits on evaluation Trunk Control: decreased Righting Reactions: decreased  Balance Balance Balance Assessed: Yes Static Sitting Balance Static Sitting - Level of Assistance: 4: Min assist Dynamic Sitting Balance Dynamic Sitting - Level of Assistance: 2: Max assist Extremity Assessment  RUE Assessment RUE Assessment: Exceptions to [facility] RUE AROM (degrees) RUE Overall AROM Comments: Decreased overall AROM thorughout RUE. Able to flex shoulder ~90*, elbow flexion & extension AROM is WFL, wrist flexion & extension AROM is WFL RUE PROM (degrees) RUE Overall PROM Comments: PROM is [facility] RUE  Strength RUE Overall Strength Comments: Decreased strength throughout. 3-/5 shoulder strength, 3+/5 elbow strength LUE Assessment LUE Assessment: Exceptions to WFL LUE AROM (degrees) LUE Overall AROM Comments: decreased overall AROM -> left UE. Patient unable to actively flex shoulder. Patient able to perform elbow flexion & extension against gravity, wrist flexion & extension against gravity LUE PROM (degrees) LUE Overall PROM Comments: PROM is WFL LUE Strength LUE Overall Strength Comments: 2-/5 shoulder strength, 3/5 elbow strength RLE Assessment RLE Assessment: Exceptions to [facility] (3-/5 grossly in hip and knee; 3/5 ankle; no inc tone noted) LLE Assessment LLE Assessment: Exceptions to [facility] (3/5 grossly; no inc tone noted)  FIM:  FIM - Banker Devices: [name] elevated;Sliding board Bed/Chair Transfer: 2: Supine > Sit: Max A (lifting assist/Pt. 25-49%);2: Sit > Supine: Max A (lifting assist/Pt. 25-49%);2: Bed > Chair or W/C: Max A (lift and lower assist);2: Chair or W/C > Bed: Max A (lift and lower assist);1: Two helpers FIM - Locomotion: Wheelchair Locomotion: Wheelchair: 2: Travels 50 - 149 ft with moderate assistance (Pt: 50 - 74%) FIM - Locomotion: Ambulation Locomotion: Ambulation: 0: Activity did not occur (unsafe to attempt on eval) FIM - Locomotion: Stairs Locomotion: Stairs: 0: Activity did not occur (unsafe to attempt on eval)   Refer to Care Plan for Long Term Goals  Recommendations for other services: None  Discharge Criteria: Patient will be discharged from PT if patient refuses treatment 3 consecutive times without medical reason, if treatment goals not met, if there is a change in medical status, if patient makes no progress towards goals or if patient is discharged from hospital.  The above assessment, treatment plan, treatment alternatives and goals were discussed and mutually agreed upon: by patient  Individual treatment focused  on transfer technique, education on pressure relief, bed mobility, sitting balance EOB, and w/c propulsion. Applied theraband to wheels to aid with grip and propulsion. Encouraged to sit up in w/c for OOB tolerance with call bell in reach.   [name] 02/26/2012, 11:30 AM

## 2012-02-26 NOTE — Plan of Care (Signed)
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  Co-morbidities: pneumonia, sepsis, cva, etoh abuse  Functional Problem List  Patient demonstrates impairments in the following areas: Balance, Bladder, Bowel, Endurance, Medication Management, Motor, Pain, Safety, Sensory  and Skin Integrity  Basic ADL's: eating, grooming, bathing, dressing and toileting Advanced ADL's: n/a at this time  Transfers:  bed mobility, bed to chair, toilet, tub/shower, car and furniture Locomotion:  ambulation, wheelchair mobility and stairs  Additional Impairments:  Leisure Awareness  Anticipated Outcomes Item Anticipated Outcome  Eating/Swallowing    Basic self-care  Overall supervision -> min assist  Tolieting  Min assist  Bowel/Bladder  Cont of bowel and bladder  Transfers  Min A overall  Locomotion  Mod I w/c propulsion  Communication    Cognition    Pain  less or equal to 3  Safety/Judgment  Free of fall  Other     Therapy Plan: PT Intensity: Minimum of 1-2 x/day ,45 to 90 minutes PT Frequency: 5 out of 7 days PT Duration Estimated Length of Stay: 3-4 weeks OT Intensity: Minimum of 1-2 x/day, 45 to 90 minutes OT Frequency: 5 out of 7 days OT Duration/Estimated Length of Stay: ~4 weeks      Team Interventions: Item RN PT OT SLP SW TR Other  Self Care/Advanced ADL Retraining  x x      Neuromuscular Re-Education  x x      Therapeutic Activities  x x      UE/LE Strength Training/ROM  x x      UE/LE Coordination Activities  x x      Visual/Perceptual Remediation/Compensation         DME/Adaptive Equipment Instruction  x x      Therapeutic Exercise  x x      Balance/Vestibular Training  x x      Patient/Family Education x x x      Cognitive Remediation/Compensation         Functional Mobility Training  x x      Ambulation/Gait Training   x x      Stair Training  x       Wheelchair Propulsion/Positioning  x x      Functional Electrical Stimulation  x x      Community Reintegration  x x      Dysphagia/Aspiration Film/video editor         Bladder Management x        Bowel Management x        Disease Management/Prevention x x x      Pain Management x x x      Medication Management x        Skin Care/Wound Management x x x      Splinting/Orthotics  x x      Discharge Planning x x x      Psychosocial Support x x x                             Team Discharge Planning: Destination: PT-Home ,OT- Home , SLP-  Projected Follow-up: PT-Home health PT;24 hour supervision/assistance, OT-  Home health OT, SLP-  Projected Equipment Needs: PT-Wheelchair (measurements);Wheelchair cushion (measurements);Sliding board, OT- 3 in 1 bedside comode;Tub/shower bench, SLP-  Patient/family involved in discharge planning: PT-  Patient,  OT-Patient, SLP-   MD ELOS: 3.5 weeks Medical Rehab Prognosis:  Good Assessment: The patient has been admitted for CIR therapies. The team will be addressing, functional mobility, strength, stamina, balance, safety, adaptive techniques/equipment, self-care, bowel and bladder mgt, patient and caregiver education, NMR, pain mgt, orthotics. Goals have been set at minimal assistance to supervision with basic self-care and mobility.      See Team Conference Notes for weekly updates to the plan of care

## 2012-02-26 NOTE — Progress Notes (Signed)
Occupational Therapy Session Note  Patient Details  Name: Frank Brock MRN: [id number] Date of Birth: 07-31-1960  Today's Date: 02/26/2012 Time: 9147-8295 Time Calculation (min): 40 min  Short Term Goals: Week 1:  OT Short Term Goal 1 (Week 1): Patient will complete UB dressing sitting edge of bed with moderate assistance OT Short Term Goal 2 (Week 1): Patient will complete LB dressing in supine position with maximal assistance OT Short Term Goal 3 (Week 1): Patient will perform grooming tasks seated at sink with miminal assistance OT Short Term Goal 4 (Week 1): BSC will be introduced to patient for bowel program with nursing  Skilled Therapeutic Interventions/Progress Updates:  Patient supine in bed with HOB flat upon arrival.  Patient stating, "I just had to det out of that w/c cause the boil on my bottom hurt so bad".   Focused session on patient understanding cause for sacral sore as sell as management of the sore to produce healing.  Patient able to state that nutrition helps and medication.  Reviewed need for pressure relief and need for patient to initiate assistance when position changes in bed and w/c are necessary and patient agreed.  Introduced handheld bed control option and with practice, patient able to perform HOB up and down.  Introduced use of one straw in drinking pitcher and patient using right hand on the handle and stabilize bottom of pitcher with left hand and patient able to perform this task after initial practice with HOB up.  Patient has been using an adapted long straw and now will use one straw if not lying down.  Patient retrieved small round snacks from bowl and feed self with RUE after 3-4 failed attempts.  Initially patient did not want to attempt tasks due to impairments stating, "when I get strong I can do...." yet when assisted to perform tasks he began to state that he is now getting the concept of rehab.  Patient assisted in right sidelying with all items within  reach and bed alarm engaged.  Therapy Documentation Precautions:  Precautions Precautions: Cervical;Fall Required Braces or Orthoses: Cervical Brace Cervical Brace: Soft collar Restrictions Weight Bearing Restrictions: No Pain: Pain in neck varies from 2/10-8/10 - "seems to get worse when sitting upright", Sacral sore also varies from 2/10 - 10/10.  Sometimes the pain comes on even when he is on his side with no pressure on the wound.  Changed positions, adapted session and RN aware. ADL: See FIM for current functional status  Therapy/Group: Individual Therapy  Rhiannan Kievit 02/26/2012, 5:36 PM

## 2012-02-26 NOTE — Progress Notes (Signed)
Subjective/Complaints: Slept well. Having some pain (usually rectal, related to stool)-knows as or after he's had bm A 12 point review of systems has been performed and if not noted above is otherwise negative.   Objective: Vital Signs: Blood pressure 127/81, pulse 83, temperature 98.3 F (36.8 C), temperature source Oral, resp. rate 19, SpO2 94.00%. Dg Chest Port 1 View  02/25/2012  *RADIOLOGY REPORT*  Clinical Data: Rechecked chest tube removal  PORTABLE CHEST - 1 VIEW  Comparison: Chest radiograph 01/29/ 2014  Findings: Normal cardiac silhouette.  Left PICC line in place.  No evidence of pneumothorax following left chest tube removal.  There is increased rounded nodularity in the left lower lobe measuring 2 cm .  Mild bilateral air space disease is similar to prior.  IMPRESSION:  1.  No evidence pneumothorax. 2.  New left lower lobe rounded nodule suggests a new focus of infection. 3.  Scattered air space disease is similar to prior.   Original Report Authenticated By: Genevive Bi, M.D.     Basename 02/26/12 0500 02/24/12 0402  WBC 10.7* 9.6  HGB 10.1* 9.3*  HCT 30.6* 27.9*  PLT 601* 615*    Basename 02/26/12 0500 02/25/12 1026  NA 133* 134*  K 4.1 4.0  CL 99 102  GLUCOSE 113* 137*  BUN 8 10  CREATININE 0.53 0.53  CALCIUM 9.1 9.1   CBG (last 3)  No results found for this basename: GLUCAP:3 in the last 72 hours  Wt Readings from Last 3 Encounters:  02/25/12 76.2 kg (167 lb 15.9 oz)  02/25/12 76.2 kg (167 lb 15.9 oz)  02/25/12 76.2 kg (167 lb 15.9 oz)    Physical Exam:  Constitutional: He is oriented to person, place, and time. He appears well-developed. He has a sickly appearance.  Cardiovascular: Normal rate and regular rhythm.  Pulmonary/Chest: Effort normal. He has rales in the left middle field.  Abdominal: Soft. Bowel sounds are normal.  Musculoskeletal: He exhibits no tenderness.  Minimal edema left forearm. Bilateral heels boggy--non tender.  Neurological: He  is alert and oriented to person, place, and time. A bit distracted but generally appropriate  decreased sensation rectally. Decreased sensation to LT in both hands and feet.  DTR's 1+, no resting tone. Good voice/phonation, cognition is generally intact.  MOTOR TESTING:  Muscle Right Left  Deltoid 2- 2-  Triceps 3- 3-  Biceps 2 2  Wrist ext 2+ 2+  HI 2 2  HF 3 3  KE 3 3  ADF 3 3  APF 3 3  Skin:  Posterior neck incision clean and dry with steri strips in place. Lower chest wall with sutures anteriorly. Upper left chest wall with VAC in place and sealed   Assessment/Plan: 1. 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 Pt with central cord clinical picture. He is s/p surgical decompression which require 3+ hours per day of interdisciplinary therapy in a comprehensive inpatient rehab setting. Physiatrist is providing close team supervision and 24 hour management of active medical problems listed below. Physiatrist and rehab team continue to assess barriers to discharge/monitor patient progress toward functional and medical goals. FIM:                   Comprehension Comprehension Mode: Auditory Comprehension: 6-Follows complex conversation/direction: With extra time/assistive device  Expression Expression Mode: Verbal Expression: 6-Expresses complex ideas: With extra time/assistive device  Social Interaction Social Interaction: 6-Interacts appropriately with others with medication  or extra time (anti-anxiety, antidepressant).  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  2. Pain Management: Cervical collar prn for comfort. He claims a lot of his pain is "rectal" 3. Mood: Seems to have a good outlook. Will monitor along. LCSW  to follow for formal evaluation.  4. Neuropsych: This patient is capable of making decisions on his/her own behalf.  5. Disseminated MRSA: S/P evacuation of abscess and minithorocotomy Vancomycin D# 18/42  6. Anemia due to infection/crutical illness: Added iron supplement.  7. Hyponatremia: stable- recheck later this week 8. Abnormal LFTs: Likely due to sepsis. No GI symptoms. Stable to improved today 9. Neurogenic bowel/bladder- will work on scheduled bowel program  -initiate am suppository tomorrow to help with continence  -continue foley for now  LOS (Days) 1 A FACE TO FACE EVALUATION WAS PERFORMED  SWARTZ,ZACHARY T 02/26/2012 8:17 AM

## 2012-02-27 ENCOUNTER — Inpatient Hospital Stay (HOSPITAL_COMMUNITY): Payer: Medicare HMO

## 2012-02-27 ENCOUNTER — Inpatient Hospital Stay (HOSPITAL_COMMUNITY): Payer: Medicare HMO | Admitting: Occupational Therapy

## 2012-02-27 DIAGNOSIS — G061 Intraspinal abscess and granuloma: Secondary | ICD-10-CM

## 2012-02-27 DIAGNOSIS — G8250 Quadriplegia, unspecified: Secondary | ICD-10-CM

## 2012-02-27 DIAGNOSIS — J9600 Acute respiratory failure, unspecified whether with hypoxia or hypercapnia: Secondary | ICD-10-CM

## 2012-02-27 MED ORDER — ALTEPLASE 2 MG IJ SOLR
2.0000 mg | Freq: Once | INTRAMUSCULAR | Status: AC
  Administered 2012-02-27: 2 mg
  Filled 2012-02-27 (×2): qty 2

## 2012-02-27 MED ORDER — SORBITOL 70 % SOLN
45.0000 mL | Freq: Once | Status: AC
  Administered 2012-02-27: 45 mL via ORAL
  Filled 2012-02-27: qty 60

## 2012-02-27 MED ORDER — ENOXAPARIN SODIUM 80 MG/0.8ML ~~LOC~~ SOLN
70.0000 mg | Freq: Two times a day (BID) | SUBCUTANEOUS | Status: DC
  Administered 2012-02-28 – 2012-03-03 (×10): 70 mg via SUBCUTANEOUS
  Filled 2012-02-27 (×12): qty 0.8

## 2012-02-27 MED ORDER — COLLAGENASE 250 UNIT/GM EX OINT
TOPICAL_OINTMENT | Freq: Every day | CUTANEOUS | Status: DC
  Administered 2012-02-27 – 2012-03-04 (×7): via TOPICAL
  Filled 2012-02-27 (×2): qty 30

## 2012-02-27 MED ORDER — ALTEPLASE 2 MG IJ SOLR
2.0000 mg | Freq: Once | INTRAMUSCULAR | Status: DC
  Filled 2012-02-27: qty 2

## 2012-02-27 NOTE — Progress Notes (Signed)
Bowel program performed with supp.  Dig stim performed without results.  Moderate amt soft stool noted in rectum.

## 2012-02-27 NOTE — Progress Notes (Signed)
INITIAL NUTRITION ASSESSMENT  DOCUMENTATION CODES Per approved criteria  -Severe malnutrition in the context of acute illness or injury   INTERVENTION: Consider diet change to Regular Continue Ensure Complete bid Continue Beneprotein, 1 scoop tid  NUTRITION DIAGNOSIS: Inadequate oral intake related to poor appetite as evidenced by documentation..   Goal: Meet >90% estimated needs with po intake of meals and supplements.  Monitor:  Intake, labs, weight  Reason for Assessment: Discussed in rounds, poor po intake.  52 y.o. 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  ASSESSMENT: Patient with poor intake 25-50% of meals.  Patient meets criteria for Severe malnutrition related to acute illness AEB >20 lb weight loss in the last 2 weeks (12%) and decreased muscle mass and body fat.    Height: Ht Readings from Last 1 Encounters:  02/10/12 5\' 10"  (1.778 m)    Weight: Wt Readings from Last 1 Encounters:  02/27/12 159 lb 13.3 oz (72.5 kg)    Ideal Body Weight: 75 kg  % Ideal Body Weight: 97  Wt Readings from Last 10 Encounters:  02/27/12 159 lb 13.3 oz (72.5 kg)  02/25/12 167 lb 15.9 oz (76.2 kg)  02/25/12 167 lb 15.9 oz (76.2 kg)  02/25/12 167 lb 15.9 oz (76.2 kg)  02/25/12 167 lb 15.9 oz (76.2 kg)  02/06/12 181 lb 14.4 oz (82.509 kg)    Usual Body Weight: 181 lbs 2 weeks ago  % Usual Body Weight: 88  BMI:  22.9  Estimated Nutritional Needs: Kcal: 2200-2300 Protein: 110-130 gm Fluid: 2.2-2.3L  Skin: surgical debridement of left chest mass, new unstageable pressure ulcer, periwound  Diet Order: Cardiac  EDUCATION NEEDS: -No education needs identified at this time   Intake/Output Summary (Last 24 hours) at 02/27/12 1404 Last data filed at 02/27/12 0910  Gross per 24 hour  Intake    680 ml  Output   3350 ml  Net  -2670 ml    Labs:   Lab 02/26/12 0500 02/25/12 1026  02/24/12 0402  NA 133* 134* 135  K 4.1 4.0 3.4*  CL 99 102 105  CO2 21 23 22   BUN 8 10 9   CREATININE 0.53 0.53 0.49*  CALCIUM 9.1 9.1 8.0*  MG -- -- 1.8  PHOS -- -- --  GLUCOSE 113* 137* 115*    CBG (last 3)  No results found for this basename: GLUCAP:3 in the last 72 hours  Scheduled Meds:   . ipratropium  0.5 mg Nebulization TID   And  . albuterol  2.5 mg Nebulization TID  . alteplase  2 mg Intracatheter Once  . amLODipine  10 mg Oral Daily  . antiseptic oral rinse  1 application Mouth Rinse QID  . bisacodyl  10 mg Rectal Q0600  . chlorhexidine  15 mL Mouth Rinse BID  . collagenase   Topical Daily  . diphenhydrAMINE  50 mg Oral QHS  . enoxaparin (LOVENOX) injection  70 mg Subcutaneous Q12H  . feeding supplement  237 mL Oral BID BM  . magic mouthwash  5 mL Oral QID  . protein supplement  1 scoop Oral TID WC  . saccharomyces boulardii  250 mg Oral BID  . vancomycin  1,250 mg Intravenous Q12H    Continuous Infusions:   Past Medical History  Diagnosis Date  . Hypercholesteremia 02/06/2012  . Stroke 02/06/2012    09/13 secondary to hypertensive crisis.    Past Surgical History  Procedure Date  .  Inguinal hernia repair   . Irrigation and debridement abscess 02/10/2012    Procedure: IRRIGATION AND DEBRIDEMENT ABSCESS;  Surgeon: Delight Ovens, MD;  Location: [facility] OR;  Service: Thoracic;  Laterality: Left;  . Thoracotomy 02/10/2012    Procedure: MINI/LIMITED THORACOTOMY;  Surgeon: Delight Ovens, MD;  Location: [facility] OR;  Service: Thoracic;  Laterality: Left;  . Chest exploration 02/11/2012    Procedure: CHEST EXPLORATION;  Surgeon: Delight Ovens, MD;  Location: [facility] OR;  Service: Thoracic;  Laterality: Left;  re-exploration of left chest wall; with wound vac change  . Minor application of wound vac 02/11/2012    Procedure: MINOR APPLICATION OF WOUND VAC;  Surgeon: Delight Ovens, MD;  Location: [facility] OR;  Service: Thoracic;  Laterality: Left;  wound vac change  .  Posterior cervical laminectomy for epidural abscess 02/14/2012    Procedure: POSTERIOR CERVICAL LAMINECTOMY FOR EPIDURAL ABSCESS;  Surgeon: Maeola Harman, MD;  Location: [facility] ORS;  Service: Neurosurgery;  Laterality: N/A;  Cervical Laminectomy  for Epidural Abscess    Oran Rein, RD, LDN Clinical Inpatient Dietitian Pager:  [phone] Weekend and after hours pager:  [phone]

## 2012-02-27 NOTE — Progress Notes (Signed)
Subjective/Complaints: Slept well. Having some pain (usually rectal, related to stool)-knows as or after he's had bm A 12 point review of systems has been performed and if not noted above is otherwise negative.   Objective: Vital Signs: Blood pressure 119/60, pulse 83, temperature 98.2 F (36.8 C), temperature source Oral, resp. rate 20, weight 72.5 kg (159 lb 13.3 oz), SpO2 94.00%. Dg Chest Port 1 View  02/25/2012  *RADIOLOGY REPORT*  Clinical Data: Rechecked chest tube removal  PORTABLE CHEST - 1 VIEW  Comparison: Chest radiograph 01/29/ 2014  Findings: Normal cardiac silhouette.  Left PICC line in place.  No evidence of pneumothorax following left chest tube removal.  There is increased rounded nodularity in the left lower lobe measuring 2 cm .  Mild bilateral air space disease is similar to prior.  IMPRESSION:  1.  No evidence pneumothorax. 2.  New left lower lobe rounded nodule suggests a new focus of infection. 3.  Scattered air space disease is similar to prior.   Original Report Authenticated By: Genevive Bi, M.D.     Basename 02/26/12 0500  WBC 10.7*  HGB 10.1*  HCT 30.6*  PLT 601*    Basename 02/26/12 0500 02/25/12 1026  NA 133* 134*  K 4.1 4.0  CL 99 102  GLUCOSE 113* 137*  BUN 8 10  CREATININE 0.53 0.53  CALCIUM 9.1 9.1   CBG (last 3)  No results found for this basename: GLUCAP:3 in the last 72 hours  Wt Readings from Last 3 Encounters:  02/27/12 72.5 kg (159 lb 13.3 oz)  02/25/12 76.2 kg (167 lb 15.9 oz)  02/25/12 76.2 kg (167 lb 15.9 oz)    Physical Exam:  Constitutional: He is oriented to person, place, and time. He appears well-developed. He has a sickly appearance.  Cardiovascular: Normal rate and regular rhythm.  Pulmonary/Chest: Effort normal. He has rales in the left middle field.  Abdominal: Soft. Bowel sounds are normal.  Musculoskeletal: He exhibits no tenderness.  Minimal edema left forearm. Bilateral heels boggy--non tender.  Neurological: He  is alert and oriented to person, place, and time. A bit distracted but generally appropriate  decreased sensation rectally. Decreased sensation to LT in both hands and feet.  DTR's 1+, no resting tone. Good voice/phonation, cognition is generally intact.  MOTOR TESTING:  Muscle Right Left  Deltoid 2- 2-  Triceps 3- 3-  Biceps 2 2  Wrist ext 2+ 2+  HI 2 2  HF 3 3  KE 3 3  ADF 3 3  APF 3 3  Skin:  Posterior neck incision clean and dry with steri strips in place. Lower chest wall with sutures anteriorly. Upper left chest wall with VAC in place and sealed. Sacrum with small area fibronecrotic tissue near sacrum   Assessment/Plan: 1. 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 Pt with central cord clinical picture. He is s/p surgical decompression which require 3+ hours per day of interdisciplinary therapy in a comprehensive inpatient rehab setting. Physiatrist is providing close team supervision and 24 hour management of active medical problems listed below. Physiatrist and rehab team continue to assess barriers to discharge/monitor patient progress toward functional and medical goals. FIM: FIM - Bathing Bathing: 0: Activity did not occur  FIM - Upper Body Dressing/Undressing Upper body dressing/undressing: 0: Wears gown/pajamas-no public clothing (no public clothing; gown) FIM - Lower Body Dressing/Undressing Lower body dressing/undressing: 0: Wears gown/pajamas-no public clothing (no public clothing; gown)  FIM - Toileting Toileting: 0: Activity did not occur  FIM - Archivist Transfers: 0-Activity did not occur  FIM - Banker Devices: [facility] elevated;Sliding board Bed/Chair Transfer: 2: Supine > Sit: Max A (lifting assist/Pt. 25-49%);2: Sit > Supine: Max A (lifting assist/Pt. 25-49%);2: Bed > Chair or W/C: Max A (lift and lower assist);2: Chair or  W/C > Bed: Max A (lift and lower assist);1: Two helpers  FIM - Locomotion: Wheelchair Locomotion: Wheelchair: 2: Travels 50 - 149 ft with moderate assistance (Pt: 50 - 74%) FIM - Locomotion: Ambulation Locomotion: Ambulation: 0: Activity did not occur (unsafe to attempt on eval)  Comprehension Comprehension Mode: Auditory Comprehension: 6-Follows complex conversation/direction: With extra time/assistive device  Expression Expression Mode: Verbal Expression: 6-Expresses complex ideas: With extra time/assistive device  Social Interaction Social Interaction: 6-Interacts appropriately with others with medication or extra time (anti-anxiety, antidepressant).  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  2. Pain Management: Cervical collar prn for comfort. He claims a lot of his pain is "rectal" 3. Mood: Seems to have a good outlook. Will monitor along. LCSW to follow for formal evaluation.  4. Neuropsych: This patient is capable of making decisions on his/her own behalf.  5. Disseminated MRSA: S/P evacuation of abscess and minithorocotomy Vancomycin D# 18/42  6. Anemia due to infection/crutical illness: Added iron supplement.  7. Hyponatremia: stable- recheck later this week 8. Abnormal LFTs: Likely due to sepsis. No GI symptoms. Stable to improved today 9. Neurogenic bowel/bladder- will work on scheduled bowel program  -initiate am suppository tomorrow to help with continence  -probiotic also added  -continue foley for now 10. Wound care: continue vac  -will add santyl to sacral wound daily  -already has air mattress and working on pressure relief.  LOS (Days) 2 A FACE TO FACE EVALUATION WAS PERFORMED  Chemere Steffler T 02/27/2012 6:57 AM

## 2012-02-27 NOTE — Consult Note (Signed)
WOC follow up Wound type:surgical debridement left chest mass; new Unstageable pressure ulcer Measurement:Unstageable pressure ulcer: new 3.0cm x 2.0cm x 0.2cm; apex of gluteal skin folds, located to the left of this fold Did not measure chest wound today Wound bed: Unstagable pressure ulcer : 90% yellow slough with pink at wound edges (10%) Chest: 100% pink, moist, viable muscle, 5% slough at 9 o'clock Drainage (amount, consistency, odor) buttock: none at time of assessment, no dressing in place.  Chest:serous, no odor Periwound:both areas intact  Dressing procedure/placement/frequency: enzymatic debridement ointment applied to buttock wound, cover with dry dressing, change daily.  Limit use of incontinent briefs when not in therapy to avoid excess moisture.  Air mattress overlay added per PA.   1pc white foam cut into pencil shape into pocket at 3 o'clock, then covered by black foam to fill wound bed, drape and seal obtained at , pt tolerated well.  WOC will follow along with you for VAC and monitor of pressure ulcer. Makana Rostad Floyd RN,CWOCN [phone]

## 2012-02-27 NOTE — Progress Notes (Signed)
Occupational Therapy Session Note  Patient Details  Name: Frank Brock MRN: [id number] Date of Birth: 30-Aug-1960  Today's Date: 02/27/2012 Time: 4098-1191 Time Calculation (min): 30 min  Short Term Goals: Week 1:  OT Short Term Goal 1 (Week 1): Patient will complete UB dressing sitting edge of bed with moderate assistance OT Short Term Goal 2 (Week 1): Patient will complete LB dressing in supine position with maximal assistance OT Short Term Goal 3 (Week 1): Patient will perform grooming tasks seated at sink with miminal assistance OT Short Term Goal 4 (Week 1): BSC will be introduced to patient for bowel program with nursing  Skilled Therapeutic Interventions/Progress Updates:    Pt seen for 1:1 OT with focus on transfers, static sitting balance, trunk control, and BUE strengthening.  Pt completed transfer from w/c to mat with max assist using slide board to Rt.  Engaged in weight shifting sitting edge of mat with goal of finding midline static sitting balance.  Pt reports need to develop UB strength to further assist with slide board transfer.  Use of 1# dowel rod with chest presses and bicep curls 2 reps of 10 for UB strengthening while requiring tactile cues for upright sitting posture.  Pt required reclined position between reps.  Slide board transfer back to w/c with mod assist +2 secondary to pain.  Pt reclined in reclining w/c for pressure relief and call bell in reach, encouraged to sit up 20-30 mins.  Therapy Documentation Precautions:  Precautions Precautions: Cervical;Fall Required Braces or Orthoses: Cervical Brace Cervical Brace: Soft collar Restrictions Weight Bearing Restrictions: No General: General Missed Time Reason: Nursing care Vital Signs: Oxygen Therapy SpO2: 98 % O2 Device: None (Room air) Pain: Pain Assessment Pain Assessment: 0-10 Pain Score:   5  See FIM for current functional status  Therapy/Group: Individual Therapy  Leonette Monarch 02/27/2012,  3:34 PM

## 2012-02-27 NOTE — Progress Notes (Signed)
Discussed duration of patient's antibiotic regimen with Dr. Daiva Eves. He recommended 6 total weeks IV antibiotic starting from f  I and D of back surgery.

## 2012-02-27 NOTE — Patient Care Conference (Signed)
Inpatient RehabilitationTeam Conference and Plan of Care Update Date: 02/26/2012   Time: 2:00 PM    Patient Name: Frank Brock      Medical Record Number: [id number]  Date of Birth: 1960/10/05 Sex: Male         Room/Bed: 4035/4035-01 Payor Info: Payor: [employer] MEDICARE  Plan: [employer] MEDICARE HMO/PPO  Product Type: *No Product type*     Admitting Diagnosis: CORD COMPRESSION WITH QUADRAPLEGIA  Admit Date/Time:  02/25/2012  5:31 PM Admission Comments: No comment available   Primary Diagnosis:  Quadriplegia Principal Problem: Quadriplegia  Patient Active Problem List   Diagnosis Date Noted  . Sepsis 02/19/2012  . 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 Hypokalemia 02/19/2012  . Anemia 02/19/2012  . Acute hyperglycemia 02/19/2012  . DVT of upper extremity (deep vein thrombosis) 02/14/2012  . Quadriplegia 02/14/2012  . Bacteremia due to methicillin resistant Staphylococcus aureus 02/11/2012  . S/P VATS (1/19) 02/11/2012  . Chest wall abscess 02/10/2012  . Lung abscess 02/10/2012  . Acute respiratory failure with hypoxia 02/08/2012  . Hypoxemia 02/08/2012  . H/O HTN 02/06/2012  . H/O hypercholesteremia 02/06/2012  . H/O stroke 02/06/2012    Expected Discharge Date: Expected Discharge Date: 03/21/12  Team Members Present: Physician leading conference: Dr. Faith Rogue Social Worker Present: Amada Jupiter, LCSW Nurse Present: Daryll Brod, RN PT Present: [name], PT;Other (comment) Corinna Capra., PT) OT Present: Mackie Pai, OT;Patricia Mat Carne, OT Other (Discipline and Name): Oran Rein, RD ; Tora Duck, PPS Coordinator;  Ottie Glazier, RN     Current Status/Progress Goal Weekly Team Focus  Medical   soft tissue abscess with spinal cord compression. central cord syndrom  treat wounds, bowel  mgt  bowel porgram, wound care    Bowel/Bladder   inc of bowel / had a foley catheter  cont of bowel and bladder  cont of bowel and bladder   Swallow/Nutrition/ Hydration         ADL's   overall total assist  overall min assist -> supervision  ADL retraining, functional use of bilateral UEs, dynamic sitting balance/tolerance/endurance, transfers   Mobility   overall max/total A  min A overall transfers; mod I w/c mobility  activity tolerance, OOB tolerance, sitting balance, transfers, w/c mobility   Communication             Safety/Cognition/ Behavioral Observations            Pain   denied pain at this time/ Acetaminophen 325-650 mg PRN, Oxy IR 5-10 mg PRN  less or equal to 3  free of pain   Skin   stage 2 pressure ulcer to sacrum/ allevyn dressing applied to it  free of any new skin breakdown/ infection  free of any new skin breakdown and infection    Rehab Goals Patient on target to meet rehab goals: Yes *See Interdisciplinary Assessment and Plan and progress notes for long and short-term goals  Barriers to Discharge: neurological deficits, ability of care givers to provide needed care    Possible Resolutions to Barriers:  family ed, supervision at home, home mod    Discharge Planning/Teaching Needs:  Family working to develop plan for 24/7 assistance - may have to consider SNF if this cannot be arranged.      Team Discussion:  New pt and first conference.  Anticipate pt will need 24/7 physical assist and need to confirm that caregivers are prepared to meet these needs and are realistic - SW to follow up after conference.  Pt  reports making home modifications will not be difficult - several people able to assist.  Will need a ramp.  May need to d/c home with IV abx.  Revisions to Treatment Plan:  None at this time.   Continued Need for Acute Rehabilitation Level of Care: The patient requires daily medical management by a physician with specialized training in physical medicine and rehabilitation for the following conditions: Daily direction of a multidisciplinary physical rehabilitation program to ensure safe treatment while eliciting the  highest outcome that is of practical value to the patient.: Yes Daily medical management of patient stability for increased activity during participation in an intensive rehabilitation regime.: Yes Daily analysis of laboratory values and/or radiology reports with any subsequent need for medication adjustment of medical intervention for : Neurological problems;Post surgical problems;Pulmonary problems (wound care)  Frank Brock 02/27/2012, 9:55 AM

## 2012-02-27 NOTE — Progress Notes (Signed)
Physical Therapy Session Note  Patient Details  Name: Frank Brock MRN: [id number] Date of Birth: 1960-03-12  Today's Date: 02/27/2012 Time: 1308-6578 Time Calculation (min): 45 min  Short Term Goals: Week 1:  PT Short Term Goal 1 (Week 1): Pt will be able to demonstrate bed mobility with mod A PT Short Term Goal 2 (Week 1): Pt will be able to transfer with mod A PT Short Term Goal 3 (Week 1): Pt will be able to demonstrate dynamic sitting balance with mod A during functional task PT Short Term Goal 4 (Week 1): Pt will be able to propel w/c x 100' with S  Skilled Therapeutic Interventions/Progress Updates:    Pain limiting therapy during this session. Focused on bed mobility including rolling to R and L as well as supine <-> sit, attempted sitting balance and transfer OOB but pt unable to tolerate sitting EOB on bottom greater than 30 seconds x2 before needing to return to sidelying. RN from IV team needed to change dressing and pt missed 15 minutes due to this procedure. Due to pain and unable to tolerate OOB/sitting EOB worked on supine therex for LE strengthening including heel slides, hip abduction, and SAQ x 10 reps each bilaterally. ROHO cushion placed in w/c for future use to see if pt able to tolerate sitting up in w/c any more.   Therapy Documentation Precautions:  Precautions Precautions: Cervical;Fall Required Braces or Orthoses: Cervical Brace Cervical Brace: Soft collar Restrictions Weight Bearing Restrictions: No General: Amount of Missed PT Time (min): 15 Minutes Missed Time Reason: Nursing care  Pain: C/o wound pain on bottom - RN notified and discussed pain medication scheduling.  See FIM for current functional status  Therapy/Group: Individual Therapy  [name] [facility] 02/27/2012, 12:14 PM

## 2012-02-27 NOTE — Progress Notes (Signed)
Occupational Therapy Session Notes  Patient Details  Name: Frank Brock MRN: [id number] Date of Birth: 1960/04/11  Today's Date: 02/27/2012  Short Term Goals: Week 1:  OT Short Term Goal 1 (Week 1): Patient will complete UB dressing sitting edge of bed with moderate assistance OT Short Term Goal 2 (Week 1): Patient will complete LB dressing in supine position with maximal assistance OT Short Term Goal 3 (Week 1): Patient will perform grooming tasks seated at sink with miminal assistance OT Short Term Goal 4 (Week 1): BSC will be introduced to patient for bowel program with nursing  Skilled Therapeutic Interventions/Progress Updates:   Session #1 4098-1191 - 55 Minutes Individual Therapy Patient with complaints of pain on sacrum and wound vac site; RN aware Patient found supine in bed. Patient engaged in bed mobility for LB bathing & dressing and would care from RN -> sacrum area. Patient then sat edge of bed for UB bathing & dressing. Focused skilled intervention on bed mobility, UB/LB bathing & dressing, dynamic sitting balance/tolerance/endurance, and overall activity tolerance/endurance. Patient left supine in bed on right side for pressure relief to sacrum with soft call bell left within reach. Patient barely able to sit edge of bed without complaints of excruciating pain on sacrum.   Session #2 [id number] - 45 Minutes Individual Therapy No complaints of pain Patient found supine in bed. Patient engaged in bed mobility and transferred edge of bed -> w/c using slide board with total assist X2. Therapist assisted patient with w/c positioning, then positioned patient in front of sink for grooming tasks of brushing teeth, washing hands, and washing & combing hair. Patient then started propelling self from room -> therapy gym and met up with therapist in hall for next therapy session.   Precautions:  Precautions Precautions: Cervical;Fall Required Braces or Orthoses: Cervical  Brace Cervical Brace: Soft collar Restrictions Weight Bearing Restrictions: No  See FIM for current functional status  Laurynn Mccorvey 02/27/2012, 7:42 AM

## 2012-02-28 ENCOUNTER — Inpatient Hospital Stay (HOSPITAL_COMMUNITY): Payer: Medicare HMO | Admitting: Occupational Therapy

## 2012-02-28 ENCOUNTER — Inpatient Hospital Stay (HOSPITAL_COMMUNITY): Payer: Medicare HMO

## 2012-02-28 MED ORDER — ALBUTEROL SULFATE (5 MG/ML) 0.5% IN NEBU: 2.5000 mg | INHALATION_SOLUTION | Freq: Four times a day (QID) | RESPIRATORY_TRACT | Status: DC | PRN

## 2012-02-28 MED ORDER — ALBUTEROL SULFATE (5 MG/ML) 0.5% IN NEBU: 2.5000 mg | INHALATION_SOLUTION | Freq: Three times a day (TID) | RESPIRATORY_TRACT | Status: DC

## 2012-02-28 MED ORDER — IPRATROPIUM BROMIDE 0.02 % IN SOLN: 0.5000 mg | Freq: Four times a day (QID) | RESPIRATORY_TRACT | Status: DC | PRN

## 2012-02-28 NOTE — Progress Notes (Signed)
ANTIBIOTIC& ANTICOAGULANT CONSULT NOTE - FOLLOW UP  Pharmacy Consult for Vancomycin and Lovenox Indication: MRSA infections/ RUE DVT  No Known Allergies  Patient Measurements: Weight: 162 lb 11.2 oz (73.8 kg)  Vital Signs: Temp: 97.9 F (36.6 C) (02/06 0641) Temp src: Oral (02/06 0641) BP: 120/62 mmHg (02/06 0641) Pulse Rate: 77  (02/06 0641) Intake/Output from previous day: 02/05 0701 - 02/06 0700 In: 720 [P.O.:720] Out: 3800 [Urine:3800] Intake/Output from this shift: Total I/O In: 480 [P.O.:480] Out: -   Labs:  Basename 02/26/12 0500  WBC 10.7*  HGB 10.1*  PLT 601*  LABCREA --  CREATININE 0.53   The CrCl is unknown because both a height and weight (above a minimum accepted value) are required for this calculation. No results found for this basename: VANCOTROUGH:2,VANCOPEAK:2,VANCORANDOM:2,GENTTROUGH:2,GENTPEAK:2,GENTRANDOM:2,TOBRATROUGH:2,TOBRAPEAK:2,TOBRARND:2,AMIKACINPEAK:2,AMIKACINTROU:2,AMIKACIN:2, in the last 72 hours   Assessment: Patient is on Day #21 Vancomycin for MRSA bacteremia and diffuse spinal. Plan for treat for 6 weeks through 3/6. Trough level was slightly above goal on 2/3, and dose has been adjusted. Most recent scr = 0.53 on 2/4. Patient is afebrile, wbc 10.7, no new cultures since 1/23.  Patient is also on full dose lovenox for a new subclavian DVT scr 0.53 on 2/4, hgb (10.1) and plt (601) are stable.  lovenox dose adjusted based on current weight  Goal of Therapy:  Vancomycin trough level 15-20 mcg/ml  Plan:  Continue vancomycin 1250mg  IV q12hrs. Check BMET tomorrow AM and check vancomycin trough if renal function change suspected Continues on Lovenox 70 mg Q12hrs.  CBC at least every 3 days.  Will follow up for transition to oral agent when possible.  Bayard Hugger, PharmD, BCPS  Clinical Pharmacist  Pager: [phone]   02/28/2012,12:36 PM

## 2012-02-28 NOTE — Progress Notes (Signed)
Occupational Therapy Session Notes  Patient Details  Name: Frank Brock MRN: [id number] Date of Birth: 21-Oct-1960  Today's Date: 02/28/2012  Short Term Goals: Week 1:  OT Short Term Goal 1 (Week 1): Patient will complete UB dressing sitting edge of bed with moderate assistance OT Short Term Goal 2 (Week 1): Patient will complete LB dressing in supine position with maximal assistance OT Short Term Goal 3 (Week 1): Patient will perform grooming tasks seated at sink with miminal assistance OT Short Term Goal 4 (Week 1): BSC will be introduced to patient for bowel program with nursing  Skilled Therapeutic Interventions/Progress Updates:   Session #1 [id number] - 55 Minutes Individual Therapy No complaints of pain except discomfort -> sacrum area Patient found supine in bed. Engaged in bed mobility for peri care and to change brief. Therapist notified RN for dressing change to bottom and of new redness -> bilateral cheeks. Patient then re-positioned self in bed and therapist sat patient in "chair position". From that position patient worked on threading bilateral LEs into pants and donning socks. Therapist then lowered patient to supine/flat position to pull pants up to waist through bridging and side rolling left <-> right. Patient engaged in bed mobility to sit edge of bed for UB bathing & dressing as well as AAROM -> bilateral shoulders (shoulder flexion and shoulder internal & external rotation). Patient then requested to lay down secondary to neck discomfort. Therapist assisted patient with positioning onto right side for pressure relief -> sacrum area. Patient left in this position with call bell within reach.   Session #2 1400-1430 - 30 Minutes Individual Therapy No complaints of pain Patient found supine in bed. Checked brief and patient with incontinent BM. Patient engaged in bed mobility for therapist to perform peri care. RN present for dressing change. Patient then engaged in supine  -> sit for edge of bed -> w/c transfer with total assist X2 using slide board. Patient left seated in w/c beside bed with call bell within reach. Discussed importance of being cleaned immediately after BM.   Precautions:  Precautions Precautions: Cervical;Fall Required Braces or Orthoses: Cervical Brace Cervical Brace: Soft collar Restrictions Weight Bearing Restrictions: No  See FIM for current functional status  Addalynne Golding 02/28/2012, 8:05 AM

## 2012-02-28 NOTE — Progress Notes (Signed)
Social Work  Social Work Assessment and Plan  Patient Details  Name: Frank Brock MRN: [id number] Date of Birth: [birth year]  Today's Date: 02/28/2012  Problem List:  Patient Active Problem List  Diagnosis  . H/O HTN  . H/O hypercholesteremia  . H/O stroke  . Acute respiratory failure with hypoxia  . Hypoxemia  . Chest wall abscess  . Lung abscess  . Bacteremia due to methicillin resistant Staphylococcus aureus  . S/P VATS (1/19)  . DVT of upper extremity (deep vein thrombosis)  . Quadriplegia  . Sepsis  . Paraspinal epidural abscess  . Hypokalemia  . Anemia  . Acute hyperglycemia   Past Medical History:  Past Medical History  Diagnosis Date  . Hypercholesteremia 02/06/2012  . Stroke 02/06/2012    09/13 secondary to hypertensive crisis.   Past Surgical History:  Past Surgical History  Procedure Date  . Inguinal hernia repair   . Irrigation and debridement abscess 02/10/2012    Procedure: IRRIGATION AND DEBRIDEMENT ABSCESS;  Surgeon: Delight Ovens, MD;  Location: [facility] OR;  Service: Thoracic;  Laterality: Left;  . Thoracotomy 02/10/2012    Procedure: MINI/LIMITED THORACOTOMY;  Surgeon: Delight Ovens, MD;  Location: [facility] OR;  Service: Thoracic;  Laterality: Left;  . Chest exploration 02/11/2012    Procedure: CHEST EXPLORATION;  Surgeon: Delight Ovens, MD;  Location: [facility] OR;  Service: Thoracic;  Laterality: Left;  re-exploration of left chest wall; with wound vac change  . Minor application of wound vac 02/11/2012    Procedure: MINOR APPLICATION OF WOUND VAC;  Surgeon: Delight Ovens, MD;  Location: [facility] OR;  Service: Thoracic;  Laterality: Left;  wound vac change  . Posterior cervical laminectomy for epidural abscess 02/14/2012    Procedure: POSTERIOR CERVICAL LAMINECTOMY FOR EPIDURAL ABSCESS;  Surgeon: Maeola Harman, MD;  Location: [facility] NEURO ORS;  Service: Neurosurgery;  Laterality: N/A;  Cervical Laminectomy  for Epidural Abscess   Social History:  reports that he has  been smoking.  He has never used smokeless tobacco. He reports that he drinks about 1.8 ounces of alcohol per week. He reports that he does not use illicit drugs.  Family / Support Systems Marital Status: Separated How Long?: approx 10 years but still with good relationship and support between them. Patient Roles: Spouse;Parent Spouse/Significant Other: *wife, Frank Brock @ [phone] Children: pt and wife have two adult children - 62 yr old daughter living with her mother and works f/t.  72 yr old daughter in school in [place], [place] Other Supports: mother, Frank Brock @ [phone] (plus step-father and brother - all living in the same house with patient) Anticipated Caregiver: Mom, brother, wife Ability/Limitations of Caregiver: Mom works days, wife stays at home, has 2 brothers who can assist as well. Caregiver Availability: 24/7 (Wife to assist days, mom at night, brothers to help as well.) Family Dynamics: As noted, pt describes all family members, including wife, to be very supportive and willing to assist as their work schedules allow.  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  Answers with brief responses and occasionally states, "  you have all that information in my chart".  Obvious that pt simply wants to complete interview as quickly as possible.  Does report he feels he is making "slow progress" and reports  on new functional gains he has witnessed.  Denies any s/s of depression or anxiety.  Willl defer formal screen at this time, however, will monitor throughout his stay. Recent Psychosocial Issues: Loss of job and move back to [place] from [place]. Pyschiatric History: Pt reports none Substance Abuse History: Pt denies any h/o SA, however, chart documetation indicates "heavy alcohol use"  Patient / Family Perceptions, Expectations & Goals Pt/Family understanding of illness & functional limitations: Pt able to offer basic report of his medical issues - appears to have fairly good understanding of how the MRSA abcesses have affected his functional abilities.   Premorbid pt/family roles/activities: Pt reports he was relatively independent until start of the year. Anticipated changes in roles/activities/participation: Pt will requrie 24/7 assistance most likely, therefore, family members will need to share/ assume caregiver support roles. Pt/family expectations/goals: "I don't know...just want to get as much back as I can".  Realistic that a ramp and w/c level goals are anticipated.  Community Resources [employer]: None Premorbid Home Care/DME Agencies: None Transportation available at discharge: yes Resource referrals recommended: Neuropsychology;Support group (specify) ([employer] online groups?)  Discharge Planning Living Arrangements: Parent;Other relatives Support Systems: Spouse/significant other;Parent;Other relatives;Children Type of Residence: Private residence Insurance Resources: Media planner (specify) Administrator Medicare) Financial Resources: Other (Comment) (Unemployment ) Financial Screen Referred: No Living Expenses: Lives with family Money Management: Patient Do you have any problems obtaining your medications?: No Home Management: family shares in management Patient/Family Preliminary Plans: Pt fully intends to return to his parents' home where they wil share assistance for pt  between themselves, brothers and pt's wife Social Work Anticipated Follow Up Needs: HH/OP;Support Group Expected length of stay: 3-4 weeks  Clinical Impression Pt willing to complete interview, however, very uncomfortable throughout and offers very brief answers.  Pt describes very supportive family and denies any concerns about their ability to provide 24/7 assistance at home.  Pt also denies any significant emotional distress, however, will continue to monitor throughout stay.  Frank Brock 02/28/2012, 9:41 AM

## 2012-02-28 NOTE — Progress Notes (Signed)
Physical Therapy Session Note  Patient Details  Name: Frank Brock MRN: [id number] Date of Birth: 01-Nov-1960  Today's Date: 02/28/2012 Time: 1035-1130 Time Calculation (min): 55 min  Short Term Goals: Week 1:  PT Short Term Goal 1 (Week 1): Pt will be able to demonstrate bed mobility with mod A PT Short Term Goal 2 (Week 1): Pt will be able to transfer with mod A PT Short Term Goal 3 (Week 1): Pt will be able to demonstrate dynamic sitting balance with mod A during functional task PT Short Term Goal 4 (Week 1): Pt will be able to propel w/c x 100' with S  Skilled Therapeutic Interventions/Progress Updates:    Focused session on functional mobility training and overall activity tolerance including bed mobility, slide board transfer (max A), sit to stands with +2 assist with parallel bars, and then standing frame for WB, postural control, and standing tolerance (manual and verbal cues for weighshift to R for even weight distribution). Pt required encouragement to try new things (such as standing) as he is quick to state it didn't work down on acute. Encouraged pt to sit up for lunch and then ask for assist back to bed to rest before afternoon therapy sessions  Therapy Documentation Precautions:  Precautions Precautions: Cervical;Fall Required Braces or Orthoses: Cervical Brace Cervical Brace: Soft collar Restrictions Weight Bearing Restrictions: No    Pain:  Pain on sacrum/rectum - premedicated.  See FIM for current functional status  Therapy/Group: Individual Therapy  [name] [facility] 02/28/2012, 12:26 PM

## 2012-02-28 NOTE — Progress Notes (Signed)
Inpatient Rehabilitation Center Individual Statement of Services  Patient Name:  Frank Brock  Date:  02/28/2012  Welcome to the Inpatient Rehabilitation Center.  Our goal is to provide you with an individualized program based on your diagnosis and situation, designed to meet your specific needs.  With this comprehensive rehabilitation program, you will be expected to participate in at least 3 hours of rehabilitation therapies Monday-Friday, with modified therapy programming on the weekends.  Your rehabilitation program will include the following services:  Physical Therapy (PT), Occupational Therapy (OT), Speech Therapy (ST), 24 hour per day rehabilitation nursing, Therapeutic Recreaction (TR), Neuropsychology, Case Management (Social Worker), Rehabilitation Medicine, Nutrition Services and Pharmacy Services  Weekly team conferences will be held on Tuesdays to discuss your progress.  Your  Social Worker will talk with you frequently to get your input and to update you on team discussions.  Team conferences with you and your family in attendance may also be held.  Expected length of stay: 3-4 weeks  Overall anticipated outcome: minimal assistance from wheelchair  Depending on your progress and recovery, your program may change.  Your  Social Worker will coordinate services and will keep you informed of any changes.  Your  Social Worker's name and contact numbers are listed  below.  The following services may also be recommended but are not provided by the Inpatient Rehabilitation Center:   Driving Evaluations  Home Health Rehabiltiation Services  Outpatient Rehabilitatation [facility]  Vocational Rehabilitation   Arrangements will be made to provide these services after discharge if needed.  Arrangements include referral to agencies that provide these services.  Your insurance has been verified to be:  [employer] Your primary doctor is:  Dr. Docia Chuck  Pertinent information will be  shared with your doctor and your insurance company.  Social Worker:  [place], [place] [phone] or ([phone]  Information discussed with and copy given to patient by: Amada Jupiter, 02/28/2012, 9:45 AM

## 2012-02-28 NOTE — Progress Notes (Signed)
Occupational Therapy Session Note  Patient Details  Name: Frank Brock MRN: [id number] Date of Birth: 1960/06/16  Today's Date: 02/28/2012 Time: 0865-7846 Time Calculation (min): 53 min  Short Term Goals: Week 1:  OT Short Term Goal 1 (Week 1): Patient will complete UB dressing sitting edge of bed with moderate assistance OT Short Term Goal 2 (Week 1): Patient will complete LB dressing in supine position with maximal assistance OT Short Term Goal 3 (Week 1): Patient will perform grooming tasks seated at sink with miminal assistance OT Short Term Goal 4 (Week 1): BSC will be introduced to patient for bowel program with nursing  Skilled Therapeutic Interventions/Progress Updates:    Took pt to the OT gym initially worked on sitting balance without UE support while therapist applied resistance in all directions.  He was able to maintain his balance with light resistance but exhibits the least amount of strength in his abdominals.  Pt began reporting pain in his neck, not related to therapists resistance but just from having to hold it up against gravity.  Transitioned to supine secondary to pt not being able to get any relief in sitting.  Performed bilateral AAROM shoulder flexion to 90 degrees secondary to cervical precautions, 10 repetitions.  Also performed place and hold exercises with shoulder placed at 90 degrees as well.  Pt unable to maintain without min assist, either arm.  Therapist positioned UE in to 90 degrees shoulder flexion while pt worked on AROM elbow extension.  He was able to demonstrate 75% of full AROM on the right and full AROM on the left but demonstrates decreased eccentric control when returning to starting position.  Completed exercises with large ball and having pt hold it with both hands and push it toward the ceiling from supine.  Needed mod assist to hold ball and press to the ceiling for 10 repetitions.  Returned to wheelchair and then to bed via sliding board and max  assist.    Therapy Documentation Precautions:  Precautions Precautions: Cervical;Fall Required Braces or Orthoses: Cervical Brace Cervical Brace: Soft collar Restrictions Weight Bearing Restrictions: No  Pain: Pain Assessment Pain Assessment: 0-10 Pain Score:   8 Pain Type: Acute pain Pain Location: Neck Pain Orientation: Right;Left Pain Descriptors: Aching Pain Onset: Gradual Patients Stated Pain Goal: 2 Pain Intervention(s): Medication (See eMAR);Repositioned Multiple Pain Sites: No  See FIM for current functional status  Therapy/Group: Individual Therapy  Raad Clayson OTR/L 02/28/2012, 4:11 PM

## 2012-02-28 NOTE — Progress Notes (Signed)
Subjective/Complaints: Pain a little better. Directing turning in bed. Getting stronger A 12 point review of systems has been performed and if not noted above is otherwise negative.   Objective: Vital Signs: Blood pressure 120/62, pulse 77, temperature 97.9 F (36.6 C), temperature source Oral, resp. rate 17, weight 73.8 kg (162 lb 11.2 oz), SpO2 95.00%. No results found.  Basename 02/26/12 0500  WBC 10.7*  HGB 10.1*  HCT 30.6*  PLT 601*    Basename 02/26/12 0500 02/25/12 1026  NA 133* 134*  K 4.1 4.0  CL 99 102  GLUCOSE 113* 137*  BUN 8 10  CREATININE 0.53 0.53  CALCIUM 9.1 9.1   CBG (last 3)  No results found for this basename: GLUCAP:3 in the last 72 hours  Wt Readings from Last 3 Encounters:  02/28/12 73.8 kg (162 lb 11.2 oz)  02/25/12 76.2 kg (167 lb 15.9 oz)  02/25/12 76.2 kg (167 lb 15.9 oz)    Physical Exam:  Constitutional: He is oriented to person, place, and time. He appears well-developed. He has a sickly appearance.  Cardiovascular: Normal rate and regular rhythm.  Pulmonary/Chest: Effort normal. He has rales in the left middle field.  Abdominal: Soft. Bowel sounds are normal.  Musculoskeletal: He exhibits no tenderness.  Minimal edema left forearm. Bilateral heels boggy--non tender.  Neurological: He is alert and oriented to person, place, and time. A bit distracted but generally appropriate  decreased sensation rectally. Decreased sensation to LT in both hands and feet.  DTR's 1+, no resting tone. Good voice/phonation, cognition is generally intact.  MOTOR TESTING:  Muscle Right Left  Deltoid 3  3  Triceps 3  3  Biceps 3  3 Wrist ext 3 3 HI 3 3  HF 3 3  KE 3 3  ADF 3 3  APF 3 3  Skin:  Posterior neck incision clean and dry with steri strips in place. Lower chest wall with sutures anteriorly. Upper left chest wall with VAC in place and sealed. Sacrum with small area fibronecrotic tissue near sacrum which is a little  clearer   Assessment/Plan: 1. 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 Pt with central cord clinical picture. He is s/p surgical decompression which require 3+ hours per day of interdisciplinary therapy in a comprehensive inpatient rehab setting. Physiatrist is providing close team supervision and 24 hour management of active medical problems listed below. Physiatrist and rehab team continue to assess barriers to discharge/monitor patient progress toward functional and medical goals. FIM: FIM - Bathing Bathing: 1: Total-Patient completes 0-2 of 10 parts or less than 25%  FIM - Upper Body Dressing/Undressing Upper body dressing/undressing: 1: Total-Patient completed less than 25% of tasks FIM - Lower Body Dressing/Undressing Lower body dressing/undressing: 1: Total-Patient completed less than 25% of tasks  FIM - Toileting Toileting: 0: Activity did not occur  FIM - Archivist Transfers: 0-Activity did not occur  FIM - Banker Devices: [facility] elevated;Sliding board Bed/Chair Transfer: 2: Supine > Sit: Max A (lifting assist/Pt. 25-49%);2: Sit > Supine: Max A (lifting assist/Pt. 25-49%)  FIM - Locomotion: Wheelchair Locomotion: Wheelchair: 2: Travels 50 - 149 ft with moderate assistance (Pt: 50 - 74%) FIM - Locomotion: Ambulation Locomotion: Ambulation: 0: Activity did not occur (unsafe to attempt on eval)  Comprehension Comprehension Mode: Auditory Comprehension: 6-Follows complex conversation/direction: With extra time/assistive device  Expression Expression Mode: Verbal Expression: 6-Expresses complex ideas: With extra time/assistive  device  Social Interaction Social Interaction: 6-Interacts appropriately with others with medication or extra time (anti-anxiety, antidepressant).  Problem Solving Problem Solving: 6-Solves complex problems: With  extra time  Memory Memory: 6-More than reasonable amt of time  Medical Problem List and Plan:  1. 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  2. Pain Management: Cervical collar prn for comfort. He claims a lot of his pain is "rectal" 3. Mood: Seems to have a good outlook. Will monitor along. LCSW to follow for formal evaluation.  4. Neuropsych: This patient is capable of making decisions on his/her own behalf.  5. Disseminated MRSA: S/P evacuation of abscess and minithorocotomy Vancomycin 6 weeks post-op from (1/23)  6. Anemia due to infection/crutical illness: Added iron supplement.  7. Hyponatremia: stable- recheck tomorrow 8. Abnormal LFTs: Likely due to sepsis. No GI symptoms. Stable to improved today 9. Neurogenic bowel/bladder- will work on scheduled bowel program  -initiate am suppository tomorrow to help with continence  -probiotic also added  -continue foley for now 10. Wound care: continue vac to chest wall  -continue santyl to sacral wound daily  -pt is directing pressure relief, turning  -air overlay  LOS (Days) 3 A FACE TO FACE EVALUATION WAS PERFORMED  Keelan Tripodi T 02/28/2012 9:41 AM

## 2012-02-28 NOTE — Progress Notes (Signed)
Social Work Patient ID: Frank Brock, male   DOB: 07-Jul-1960, 52 y.o.   MRN: [id number]  Have reviewed team conference with patient - aware and agreeable with targeted d/c 2/28 at w/c level goals overall.   Pt reports his step-father is already prepared to build ramp.  Denies any concerns about having 24/7 support at home.  Will continue to follow.  Zyann Mabry, LCSW

## 2012-02-29 ENCOUNTER — Inpatient Hospital Stay (HOSPITAL_COMMUNITY): Payer: Medicare HMO | Admitting: Physical Therapy

## 2012-02-29 ENCOUNTER — Inpatient Hospital Stay (HOSPITAL_COMMUNITY): Payer: Medicare HMO | Admitting: *Deleted

## 2012-02-29 DIAGNOSIS — L89109 Pressure ulcer of unspecified part of back, unspecified stage: Secondary | ICD-10-CM

## 2012-02-29 DIAGNOSIS — G061 Intraspinal abscess and granuloma: Secondary | ICD-10-CM

## 2012-02-29 DIAGNOSIS — J9600 Acute respiratory failure, unspecified whether with hypoxia or hypercapnia: Secondary | ICD-10-CM

## 2012-02-29 DIAGNOSIS — G8250 Quadriplegia, unspecified: Secondary | ICD-10-CM

## 2012-02-29 LAB — CBC
Platelets: 481 10*3/uL — ABNORMAL HIGH (ref 150–400)
RBC: 3.6 MIL/uL — ABNORMAL LOW (ref 4.22–5.81)
RDW: 14 % (ref 11.5–15.5)
WBC: 12.4 10*3/uL — ABNORMAL HIGH (ref 4.0–10.5)

## 2012-02-29 LAB — BASIC METABOLIC PANEL
CO2: 22 mEq/L (ref 19–32)
Calcium: 9.3 mg/dL (ref 8.4–10.5)
Chloride: 97 mEq/L (ref 96–112)
GFR calc Af Amer: >90 mL/min (ref >90)
Sodium: 130 mEq/L — ABNORMAL LOW (ref 135–145)

## 2012-02-29 NOTE — Consult Note (Signed)
Wound care follow-up:  Vac dressing changed; pt medicated prior to procedure and tolerated with mod discomfort.  Removed one piece white sponge and one piece black sponge.  Applied white foam to undermined area of lower sternal wound bed, then black foam to cover at cont suction.  Wound unchanged from previous assessment, no odor, mod pink drainage.  Sacrum with 90% yellow slough, slightly loose.  Crosshatched with scalpel to allow Santyl to permeate wound bed for optimal chemical debridement.  Small yellow drainage, no odor. Continue present plan of care. Plan for vac dressing on Mon at 0830.  Cammie Mcgee, RN, MSN, [employer]  [phone]

## 2012-02-29 NOTE — Progress Notes (Signed)
Occupational Therapy Session Note  Patient Details  Name: Frank Brock MRN: [id number] Date of Birth: July 23, 1960  Today's Date: 02/29/2012 Time:  -   830-930  (60 min)    Short Term Goals: Week 1:  OT Short Term Goal 1 (Week 1): Patient will complete UB dressing sitting edge of bed with moderate assistance OT Short Term Goal 2 (Week 1): Patient will complete LB dressing in supine position with maximal assistance OT Short Term Goal 3 (Week 1): Patient will perform grooming tasks seated at sink with miminal assistance OT Short Term Goal 4 (Week 1): BSC will be introduced to patient for bowel program with nursing  Skilled Therapeutic Interventions/Progress Updates:    No complaints of pain except discomfort -> sacrum area  Patient found supine in bed. Engaged in bed mobility for peri care and to change brief.  Patient then re-positioned self in bed and therapist sat patient in "chair position". From that position patient worked on threading bilateral LEs into pants and donning socks. Therapist then lowered patient to supine/flat position to pull pants up to waist through bridging and side rolling left <-> right. Patient engaged in bed mobility to sit edge of bed for UB bathing & dressing but pt unable to tolerate EOB with air mattress flat.  Returned to supine and completed UB and washed hair.  Incorporated BUE in all activites with mod assist for holding and moving.   Patient then requested to lay down secondary to neck discomfort. Therapist assisted patient with positioning onto right side for pressure relief -> sacrum area. Patient left in this position with call bell within reach.    2nd session: Pain=  3/10  Upper traps, and sacrum area:   Engaged in bed mobility for pericare and changing depends (incontinent of BM).  Pt came to EOB with max assist and transferred to wc with sliding board (total assist  +2, pt= 40 %/.  Ptopelled wc about 60 feet.  Complained of Upper traps strain.  Engaged  in game of checkers for [name], strength.  Right arm is stronger than left.  Returned to room with transfer with sliding board with max assist.  Left patient with call bell in place and pt positioned on right side.     Therapy Documentation Precautions:  Precautions Precautions: Cervical;Fall Required Braces or Orthoses: Cervical Brace Cervical Brace: Soft collar Restrictions Weight Bearing Restrictions: No             See FIM for current functional status  Therapy/Group: Individual Therapy  For both sessions  Humberto Seals 02/29/2012, 8:41 AM

## 2012-02-29 NOTE — Plan of Care (Signed)
Problem: RH PAIN MANAGEMENT Goal: RH STG PAIN MANAGED AT OR BELOW PT'S PAIN GOAL Less or equal to 3  Outcome: Not Progressing Request prn pain medication q 3hrs-4hrs

## 2012-02-29 NOTE — Plan of Care (Signed)
Problem: RH SKIN INTEGRITY Goal: RH STG MAINTAIN SKIN INTEGRITY WITH ASSISTANCE STG Maintain Skin Integrity With Max Assistance.  Outcome: Progressing Assist with turn, and repositioning

## 2012-02-29 NOTE — Progress Notes (Signed)
Physical Therapy Session Note  Patient Details  Name: Frank Brock MRN: [id number] Date of Birth: [birth year]  Today's Date: 02/29/2012 Time: 6387-5643 Time Calculation (min): 46 min  Short Term Goals: Week 1:  PT Short Term Goal 1 (Week 1): Pt will be able to demonstrate bed mobility with mod A PT Short Term Goal 2 (Week 1): Pt will be able to transfer with mod A PT Short Term Goal 3 (Week 1): Pt will be able to demonstrate dynamic sitting balance with mod A during functional task PT Short Term Goal 4 (Week 1): Pt will be able to propel w/c x 100' with S  Skilled Therapeutic Interventions/Progress Updates:    This session focused on bed mobility to left side of bed rolling with bed rails mod I, side to sit from left min assist to lift chest up off of mattress.  Pt now on air overlay mattress and pt reports he cannot transfer with it deflated due to pain in his buttocks wound, so left air mattress overlay inflated to preform max assist slide board transfer to the right (level transfer) to WC max assist to support and lift trunk.  Seated EOB before transfer on compliant surface pt needed mod assist to maintain sitting balance.  WC mobility 90' supervision with cues for safety and technique.  Standing in parallel bars from WC max assist to block knees and support trunk over weak legs.  Verbal cues for hip extension and knee extension and upright posture.  Pt only able to stand <10 seconds at a time, but repeated x 4-5 reps with seated rest breaks between.  Transfer WC to mat table (level transfer) max assist with slide board.  Sitting balance on less compliant surface working on holding upright posture (postural muscle endurance) x 3 mins before needing mod assist to support trunk from behind due to fatigue.  Sitting working on upper extremity coordination and strength with ring slide pushing colored rings to both right and left side.  Transfer back to [facility] and from [facility] back to bed max assist with slide  board.  Sit to supine mod assist of bil legs into the bed.   Therapy Documentation Precautions:  Precautions Precautions: Cervical;Fall Required Braces or Orthoses: Cervical Brace Cervical Brace: Soft collar Restrictions Weight Bearing Restrictions: No General:   Vital Signs:   Pain: Pain Assessment Pain Score:   6 Mobility:   Locomotion : Wheelchair Mobility Distance: 90  Trunk/Postural Assessment :    Balance:   Exercises:   Other Treatments:    See FIM for current functional status  Therapy/Group: Individual Therapy  Lurena Joiner B. Dolce Sylvia, PT, DPT [phone]   02/29/2012, 2:38 PM

## 2012-02-29 NOTE — Plan of Care (Signed)
Problem: SCI BLADDER ELIMINATION Goal: RH STG MANAGE BLADDER WITH ASSISTANCE STG Manage Bladder With max Assistance  Outcome: Progressing Foley cath intact

## 2012-02-29 NOTE — Progress Notes (Signed)
Subjective/Complaints: Pain better. Slept well. Feels that he's "sinking:" into bed with overlay in place A 12 point review of systems has been performed and if not noted above is otherwise negative.   Objective: Vital Signs: Blood pressure 118/60, pulse 77, temperature 98.2 F (36.8 C), temperature source Oral, resp. rate 18, weight 72.9 kg (160 lb 11.5 oz), SpO2 97.00%. No results found. No results found for this basename: WBC:2,HGB:2,HCT:2,PLT:2 in the last 72 hours No results found for this basename: NA:2,K:2,CL:2,CO:2,GLUCOSE:2,BUN:2,CREATININE:2,CALCIUM:2 in the last 72 hours CBG (last 3)  No results found for this basename: GLUCAP:3 in the last 72 hours  Wt Readings from Last 3 Encounters:  02/29/12 72.9 kg (160 lb 11.5 oz)  02/25/12 76.2 kg (167 lb 15.9 oz)  02/25/12 76.2 kg (167 lb 15.9 oz)    Physical Exam:  Constitutional: He is oriented to person, place, and time. He appears well-developed. He has a sickly appearance.  Cardiovascular: Normal rate and regular rhythm.  Pulmonary/Chest: Effort normal. He has rales in the left middle field.  Abdominal: Soft. Bowel sounds are normal.  Musculoskeletal: He exhibits no tenderness.  Minimal edema left forearm. Bilateral heels boggy--non tender.  Neurological: He is alert and oriented to person, place, and time. A bit distracted but generally appropriate  decreased sensation rectally. Decreased sensation to LT in both hands and feet.  DTR's 1+, no resting tone. Good voice/phonation, cognition is generally intact.  MOTOR TESTING:  Muscle Right Left  Deltoid 3  3  Triceps 3  3  Biceps 3  3 Wrist ext 3 3 HI 3 3  HF 3 3  KE 3 3  ADF 3 3  APF 3 3  Skin:  Posterior neck incision clean and dry with steri strips in place. Lower chest wall with sutures anteriorly. Upper left chest wall with VAC in place and sealed. Sacrum with small area fibronecrotic tissue near sacrum which is a little clearer   Assessment/Plan: 1.  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 Pt with central cord clinical picture. He is s/p surgical decompression which require 3+ hours per day of interdisciplinary therapy in a comprehensive inpatient rehab setting. Physiatrist is providing close team supervision and 24 hour management of active medical problems listed below. Physiatrist and rehab team continue to assess barriers to discharge/monitor patient progress toward functional and medical goals. FIM: FIM - Bathing Bathing: 1: Total-Patient completes 0-2 of 10 parts or less than 25%  FIM - Upper Body Dressing/Undressing Upper body dressing/undressing: 1: Total-Patient completed less than 25% of tasks FIM - Lower Body Dressing/Undressing Lower body dressing/undressing: 1: Total-Patient completed less than 25% of tasks  FIM - Toileting Toileting: 0: Activity did not occur  FIM - Archivist Transfers: 0-Activity did not occur  FIM - Banker Devices: Sliding board;Arm rests;HOB elevated Bed/Chair Transfer: 2: Chair or W/C > Bed: Max A (lift and lower assist);2: Sit > Supine: Max A (lifting assist/Pt. 25-49%)  FIM - Locomotion: Wheelchair Locomotion: Wheelchair: 1: Travels less than 50 ft with minimal assistance (Pt.>75%) FIM - Locomotion: Ambulation Locomotion: Ambulation: 0: Activity did not occur  Comprehension Comprehension Mode: Auditory Comprehension: 6-Follows complex conversation/direction: With extra time/assistive device  Expression Expression Mode: Verbal Expression: 6-Expresses complex ideas: With extra time/assistive device  Social Interaction Social Interaction: 6-Interacts appropriately with others with medication or extra time (anti-anxiety, antidepressant).  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  2. Pain Management: Cervical collar prn for comfort. He claims a lot of his pain is "rectal" 3. Mood: Seems to have a good outlook. Will monitor along. LCSW to follow for formal evaluation.  4. Neuropsych: This patient is capable of making decisions on his/her own behalf.  5. Disseminated MRSA: S/P evacuation of abscess and minithorocotomy Vancomycin 6 weeks post-op from (1/23)  6. Anemia due to infection/crutical illness: Added iron supplement.  7. Hyponatremia: stable- recheck tomorrow 8. Abnormal LFTs: Likely due to sepsis. No GI symptoms. Stable to improved today 9. Neurogenic bowel/bladder- stools more formed  - am suppository helping with continence  -probiotic  added  -continue foley for now 10. Wound care: continue vac to chest wall  -continue santyl to sacral wound daily  -pt is directing pressure relief, turning  -air overlay for mattress  LOS (Days) 4 A FACE TO FACE EVALUATION WAS PERFORMED  Jacqulyn Barresi T 02/29/2012 8:04 AM

## 2012-02-29 NOTE — Progress Notes (Signed)
Patient ID: Frank Brock, male   DOB: 1960-02-15, 52 y.o.   MRN: [id number]                   [address]            [name] [id number]          [phone]          LOS: 4 days   Subjective: Making good progress with rehab, much improved then early in week,  Objective: Vital signs in last 24 hours: Patient Vitals for the past 24 hrs:  BP Temp Temp src Pulse Resp SpO2 Weight  02/29/12 0604 118/60 mmHg 98.2 F (36.8 C) Oral 77  18  97 % 160 lb 11.5 oz (72.9 kg)  02/28/12 1500 118/63 mmHg 98.3 F (36.8 C) Oral 74  18  95 % -    Filed Weights   02/27/12 0545 02/28/12 0613 02/29/12 0604  Weight: 159 lb 13.3 oz (72.5 kg) 162 lb 11.2 oz (73.8 kg) 160 lb 11.5 oz (72.9 kg)    Hemodynamic parameters for last 24 hours:    Intake/Output from previous day: 02/06 0701 - 02/07 0700 In: 1200 [P.O.:1200] Out: 3100 [Urine:3100] Intake/Output this shift: Total I/O In: 480 [P.O.:480] Out: -   Scheduled Meds:   . alteplase  2 mg Intracatheter Once  . amLODipine  10 mg Oral Daily  . antiseptic oral rinse  1 application Mouth Rinse QID  . bisacodyl  10 mg Rectal Q0600  . chlorhexidine  15 mL Mouth Rinse BID  . collagenase   Topical Daily  . diphenhydrAMINE  50 mg Oral QHS  . enoxaparin (LOVENOX) injection  70 mg Subcutaneous Q12H  . feeding supplement  237 mL Oral BID BM  . magic mouthwash  5 mL Oral QID  . protein supplement  1 scoop Oral TID WC  . saccharomyces boulardii  250 mg Oral BID  . vancomycin  1,250 mg Intravenous Q12H   Continuous Infusions:  PRN Meds:.acetaminophen, albuterol, alum & mag hydroxide-simeth, guaiFENesin-dextromethorphan, ipratropium, menthol-cetylpyridinium, methocarbamol, oxyCODONE, phenol, prochlorperazine, prochlorperazine, prochlorperazine, sodium chloride, traMADol, white petrolatum  Wound: vac in place and function, wound contacrting  Lab Results: CBC: Basename 02/29/12 1104  WBC 12.4*  HGB 10.5*  HCT 31.8*  PLT 481*    BMET:  Basename 02/29/12 1104  NA 130*  K 4.7  CL 97  CO2 22  GLUCOSE 107*  BUN 16  CREATININE 0.63  CALCIUM 9.3    PT/INR: No results found for this basename: LABPROT,INR in the last 72 hours   Radiology No results found.   Assessment/Plan: S/P   Continue with MWF vac change  Will see again 2/10, call if need assistance   Delight Ovens MD 02/29/2012 12:26 PM

## 2012-03-01 ENCOUNTER — Encounter (HOSPITAL_COMMUNITY): Payer: Medicare HMO

## 2012-03-01 ENCOUNTER — Inpatient Hospital Stay (HOSPITAL_COMMUNITY): Payer: Medicare HMO | Admitting: Physical Therapy

## 2012-03-01 ENCOUNTER — Inpatient Hospital Stay (HOSPITAL_COMMUNITY): Payer: Medicare HMO

## 2012-03-01 DIAGNOSIS — G061 Intraspinal abscess and granuloma: Secondary | ICD-10-CM

## 2012-03-01 DIAGNOSIS — G8250 Quadriplegia, unspecified: Secondary | ICD-10-CM

## 2012-03-01 DIAGNOSIS — J9600 Acute respiratory failure, unspecified whether with hypoxia or hypercapnia: Secondary | ICD-10-CM

## 2012-03-01 NOTE — Plan of Care (Signed)
Problem: RH PAIN MANAGEMENT Goal: RH STG PAIN MANAGED AT OR BELOW PT'S PAIN GOAL Less or equal to 3  Outcome: Progressing At times

## 2012-03-01 NOTE — Progress Notes (Signed)
Subjective:  No complaints, making progress with rehab  Objective: Vital signs in last 24 hours: Temp:  [97.7 F (36.5 C)-97.9 F (36.6 C)] 97.7 F (36.5 C) (02/08 0615) Pulse Rate:  [84-90] 90 (02/08 0615) Cardiac Rhythm:  [-]  Resp:  [16-18] 16 (02/08 0615) BP: (106-126)/(60-70) 126/60 mmHg (02/08 0812) SpO2:  [96 %-98 %] 96 % (02/08 0615) Weight:  [161 lb 13.1 oz (73.4 kg)] 161 lb 13.1 oz (73.4 kg) (02/08 0615)  Intake/Output from previous day: 02/07 0701 - 02/08 0700 In: 1080 [P.O.:1080] Out: 2400 [Urine:2400] Intake/Output this shift: Total I/O In: 120 [P.O.:120] Out: -   General appearance: alert, cooperative and no distress Heart: regular rate and rhythm Lungs: clear to auscultation bilaterally Wound: wound vac in place, appears to be smaller  Lab Results:  Recent Labs  02/29/12 1104  WBC 12.4*  HGB 10.5*  HCT 31.8*  PLT 481*   BMET:  Recent Labs  02/29/12 1104  NA 130*  K 4.7  CL 97  CO2 22  GLUCOSE 107*  BUN 16  CREATININE 0.63  CALCIUM 9.3    PT/INR: No results found for this basename: LABPROT, INR,  in the last 72 hours ABG    Component Value Date/Time   PHART 7.356 02/17/2012 0322   HCO3 27.7* 02/17/2012 0322   TCO2 29.1 02/17/2012 0322   ACIDBASEDEF 1.0 02/10/2012 1945   O2SAT 99.4 02/17/2012 0322   CBG (last 3)  No results found for this basename: GLUCAP,  in the last 72 hours  Assessment/Plan:  1. Chest wall Abscess- continue wound vac care- next change due Monday 2/10 2. Continue current care    LOS: 5 days    Lowella Dandy 03/01/2012

## 2012-03-01 NOTE — Progress Notes (Signed)
Physical Therapy Note  Patient Details  Name: Frank Brock MRN: [id number] Date of Birth: 1960-01-31 Today's Date: 03/01/2012  1000-1055 (55 minutes) individual Pain: 8/10 shoulders/neck/premedicated Focus of treatment: bilateral LE neuro re-ed; transfer training; wc mobility training/endurance; standing tolerance Treatment: Pt in bed upon arrival; supine to sit min assist (bed inflated per patient secondary to sacral wound;  Transfers  Sliding board setup + min/mod assist with assist to maintain forwatrd trunk lean; wc mobility 100 feet X 2 using bilateral UEs SBA (distance limited by UE fatigue); sit to stand X 2 using standing frame + UE shoulder arc . Pt able to tolerated standing 3-4 minutes before c/o fatigue/ increased neck pain.   1515-1600 (45 minutes) individual Pain: no reported pain Focus of treatment: transfer training; therapeutic exercises focused on bilateral LE strengthening/activity tolerance Treatment: Transfers bed >< wc  Sliding board setup + min/mod assist for assist + balance; Nustep Level 2 X 10 minutes LEs only; wc mobility 100 feet min to SBA with difficulty steering + c/o shoulder fatigue (left).    Zachery Niswander,JIM 03/01/2012, 7:28 AM

## 2012-03-01 NOTE — Progress Notes (Signed)
Occupational Therapy Session Note  Patient Details  Name: Frank Brock MRN: [id number] Date of Birth: 01-31-1960  Today's Date: 03/01/2012  Session 1 Time: 0865-7846 Time Calculation (min): 45 min  Short Term Goals: Week 1:  OT Short Term Goal 1 (Week 1): Patient will complete UB dressing sitting edge of bed with moderate assistance OT Short Term Goal 2 (Week 1): Patient will complete LB dressing in supine position with maximal assistance OT Short Term Goal 3 (Week 1): Patient will perform grooming tasks seated at sink with miminal assistance OT Short Term Goal 4 (Week 1): BSC will be introduced to patient for bowel program with nursing  Skilled Therapeutic Interventions/Progress Updates:    Pt engaged in ADL retraining at bed level with focus on bed mobility and UB bathing and dressing.  Pt exhibited ability to thread BUE into shirt sleeves but required assistance to pull over head and over trunk.  Pt able to lift legs for therapist to thread pants.  Pt required assistance using LUE to assist with bathing tasks.  Therapy Documentation Precautions:  Precautions Precautions: Cervical;Fall Required Braces or Orthoses: Cervical Brace Cervical Brace: Soft collar Restrictions Weight Bearing Restrictions: No   Pain: Pain Assessment Pain Assessment: 0-10 Pain Score:   7 Pain Type: Acute pain Pain Location: Neck Pain Orientation: Upper Pain Descriptors: Aching Pain Frequency: Intermittent Pain Intervention(s):RN aware; repositioned  See FIM for current functional status  Therapy/Group: Individual Therapy  Session 2 Time: 1400-1445 Pt c/o 5/10 pain in neck which increased to 7/10 pain with activity; RN aware; repositioned Individual therapy Pt engaged in BUE therex including shoulder flexion/extension and shoulder adduciton/abduction.  Super soft Theraputty issued to patient and patient instructed on activities with theraputty.  Lavone Neri [facility] 03/01/2012, 1:09  PM

## 2012-03-01 NOTE — Progress Notes (Signed)
Frank Brock is a 52 y.o. male 1960/11/17 [id number]  Subjective: No new complaints. No new problems. Slept well.   Objective: Vital signs in last 24 hours: Temp:  [97.7 F (36.5 C)-97.9 F (36.6 C)] 97.7 F (36.5 C) (02/08 0615) Pulse Rate:  [84-90] 90 (02/08 0615) Resp:  [16-18] 16 (02/08 0615) BP: (106-126)/(60-70) 126/60 mmHg (02/08 0812) SpO2:  [96 %-98 %] 96 % (02/08 0615) Weight:  [73.4 kg (161 lb 13.1 oz)] 73.4 kg (161 lb 13.1 oz) (02/08 0615) Weight change: 0.5 kg (1 lb 1.6 oz) Last BM Date: 02/29/12  Intake/Output from previous day: 02/07 0701 - 02/08 0700 In: 1080 [P.O.:1080] Out: 2400 [Urine:2400] Last cbgs: CBG (last 3)  No results found for this basename: GLUCAP,  in the last 72 hours   Physical Exam General: No apparent distress    Lungs: Normal effort. Lungs clear to auscultation, no crackles or wheezes. Cardiovascular: Regular rate and rhythm, no edema Musculoskeletal:  Neurovascularly intact Neurological: No new neurological deficits Wounds:  Clean, dry, intact. No signs of infection.  Lab Results: BMET    Component Value Date/Time   NA 130* 02/29/2012 1104   NA 135* 02/06/2012 0942   K 4.7 02/29/2012 1104   K 4.0 02/06/2012 0942   CL 97 02/29/2012 1104   CL 97* 02/06/2012 0942   CO2 22 02/29/2012 1104   CO2 25 02/06/2012 0942   GLUCOSE 107* 02/29/2012 1104   GLUCOSE 149* 02/06/2012 0942   BUN 16 02/29/2012 1104   BUN 19.0 02/06/2012 0942   CREATININE 0.63 02/29/2012 1104   CREATININE 1.3 02/06/2012 0942   CALCIUM 9.3 02/29/2012 1104   CALCIUM 10.3 02/06/2012 0942   GFRNONAA >90 02/29/2012 1104   GFRAA >90 02/29/2012 1104   CBC    Component Value Date/Time   WBC 12.4* 02/29/2012 1104   WBC 35.3* 02/06/2012 0942   RBC 3.60* 02/29/2012 1104   RBC 4.48 02/06/2012 0942   HGB 10.5* 02/29/2012 1104   HGB 13.5 02/06/2012 0942   HCT 31.8* 02/29/2012 1104   HCT 39.0 02/06/2012 0942   PLT 481* 02/29/2012 1104   PLT 432* 02/06/2012 0942   MCV 88.3 02/29/2012 1104   MCV 87.0  02/06/2012 0942   MCH 29.2 02/29/2012 1104   MCH 30.0 02/06/2012 0942   MCHC 33.0 02/29/2012 1104   MCHC 34.5 02/06/2012 0942   RDW 14.0 02/29/2012 1104   RDW 14.5 02/06/2012 0942   LYMPHSABS 1.4 02/26/2012 0500   LYMPHSABS 1.0 02/06/2012 0942   MONOABS 1.0 02/26/2012 0500   MONOABS 2.2* 02/06/2012 0942   EOSABS 0.4 02/26/2012 0500   EOSABS 0.2 02/06/2012 0942   BASOSABS 0.1 02/26/2012 0500   BASOSABS 0.1 02/06/2012 0942   Lab Results  Component Value Date   ALT 99* 02/26/2012   AST 48* 02/26/2012   ALKPHOS 98 02/26/2012   BILITOT 0.3 02/26/2012    Studies/Results: No results found.  Medications: I have reviewed the patient's current medications.  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 Pt with central cord clinical picture. He is s/p surgical decompression which require 3+ hours per day of interdisciplinary therapy in a comprehensive inpatient rehab setting.  Physiatrist is providing close team supervision and 24 hour management of active medical problems listed below.  Physiatrist and rehab team continue to assess barriers to discharge/monitor patient progress toward functional and medical goals. 1. 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  2. Pain  Management: Cervical collar prn for comfort. He claims a lot of his pain is "rectal"  3. Mood: Seems to have a good outlook. Will monitor along. LCSW  following.  4. Neuropsych: This patient is capable of making decisions on his/her own behalf.  5. Disseminated MRSA: S/P evacuation of abscess and minithorocotomy -ongoing Vancomycin 6 weeks post-op (started 1/23)  6. Anemia due to infection/critical illness:  On iron supplement.  7. Hyponatremia: stable-  8. Abnormal LFTs: Likely due to sepsis. No GI symptoms. Stable  9. Neurogenic bowel/bladder- stools more formed  - am suppository helping with continence  -probiotic added  -continue foley for now  10.  Wound care: continue vac to chest wall  -continue santyl to sacral wound daily  -pt is directing pressure relief, turning  -air overlay for mattress    Length of stay, days: 5  IRETON,SUSAN C , PA-C 03/01/2012, 9:04 AM  I have examined the patient and agree with above.  Valerie A. Felicity Coyer, MD 9:53 AM

## 2012-03-02 ENCOUNTER — Inpatient Hospital Stay (HOSPITAL_COMMUNITY): Payer: Medicare HMO | Admitting: *Deleted

## 2012-03-02 LAB — VANCOMYCIN, TROUGH: Vancomycin Tr: 14.8 ug/mL (ref 10.0–20.0)

## 2012-03-02 NOTE — Progress Notes (Addendum)
ANTIBIOTIC CONSULT NOTE - FOLLOW UP  Pharmacy Consult for Vancomycin Indication: MRSA infections  No Known Allergies  Patient Measurements: Weight: 161 lb 13.1 oz (73.4 kg)  Vital Signs: Temp: 97.8 F (36.6 C) (02/08 1511) Temp src: Oral (02/08 1511) BP: 123/59 mmHg (02/08 1511) Pulse Rate: 79 (02/08 1511) Intake/Output from previous day: 02/08 0701 - 02/09 0700 In: 600 [P.O.:600] Out: 650 [Urine:650] Intake/Output from this shift:    Labs:  Recent Labs  02/29/12 1104  WBC 12.4*  HGB 10.5*  PLT 481*  CREATININE 0.63   The CrCl is unknown because both a height and weight (above a minimum accepted value) are required for this calculation.  Recent Labs  03/02/12 0047  VANCOTROUGH 14.8     Assessment: Patient is on Day #24 vancomycin for MRSA bacteremia and diffuse spinal infection. Plan for treat for 6 weeks through 3/6.   Vancomycin trough of 14.8 mcg/mL on 1250mg  IV Q12H. Level was drawn ~ 1.5 hours late, therefore true vancomycin trough is at-goal.   Goal of Therapy:  Vancomycin trough level 15-20 mcg/ml  Plan:  1. Continue vancomycin 1250mg  IV Q12H.   Lorre Munroe, PharmD 03/02/2012,1:45 AM

## 2012-03-02 NOTE — Progress Notes (Signed)
ANTICOAGULATION CONSULT NOTE - Follow Up Consult  Pharmacy Consult for lovenox Indication: RUE DVT  No Known Allergies  Patient Measurements: Weight: 160 lb 0.9 oz (72.6 kg)  Vital Signs: Temp: 97.9 F (36.6 C) (02/09 0610) Temp src: Oral (02/09 0610) BP: 125/64 mmHg (02/09 0610) Pulse Rate: 84 (02/09 0610)  Labs:  Recent Labs  02/29/12 1104  HGB 10.5*  HCT 31.8*  PLT 481*  CREATININE 0.63    The CrCl is unknown because both a height and weight (above a minimum accepted value) are required for this calculation.  Assessment: 51 yom continues on lovenox for treatment of a new subclavian DVT. CBC and renal fxn are stable as of 2/7. No bleeding noted. Dose is appropriate for patients weight.    Goal of Therapy:  Anti-Xa level 0.6-1.2 units/ml 4hrs after LMWH dose given Monitor platelets by anticoagulation protocol: Yes   Plan:  1. Continue lovenox 70mg  SQ Q12H 2. F/u CBC Q72H while on lovenox 3. F/u transition to oral anticoagulation  Ruperto Kiernan, Drake Leach 03/02/2012,10:41 AM

## 2012-03-02 NOTE — Plan of Care (Signed)
Problem: RH PAIN MANAGEMENT Goal: RH STG PAIN MANAGED AT OR BELOW PT'S PAIN GOAL Less or equal to 3  Outcome: Not Progressing Not less than a 5 out of 10 today with the PRN pain medications

## 2012-03-02 NOTE — Progress Notes (Signed)
Frank Brock is a 52 y.o. male 13-May-1960 [id number]  Subjective: No new complaints. No new problems. Feels well overall.  Objective: Vital signs in last 24 hours: Temp:  [97.8 F (36.6 C)-97.9 F (36.6 C)] 97.9 F (36.6 C) (02/09 0610) Pulse Rate:  [79-84] 84 (02/09 0610) Resp:  [18] 18 (02/09 0610) BP: (123-125)/(59-64) 125/64 mmHg (02/09 0610) SpO2:  [98 %] 98 % (02/09 0610) Weight:  [72.6 kg (160 lb 0.9 oz)] 72.6 kg (160 lb 0.9 oz) (02/09 0610) Weight change: -0.8 kg (-1 lb 12.2 oz) Last BM Date: 02/29/12  Intake/Output from previous day: 02/08 0701 - 02/09 0700 In: 840 [P.O.:840] Out: 2875 [Urine:2875] Last cbgs: CBG (last 3)  No results found for this basename: GLUCAP,  in the last 72 hours   Physical Exam General: No apparent distress    Lungs: Normal effort. Lungs clear to auscultation, no crackles or wheezes. Cardiovascular: Regular rate and rhythm, no edema Neurological: No new neurological deficits Wounds:  Clean, dry, intact. No signs of infection.  Lab Results: BMET    Component Value Date/Time   NA 130* 02/29/2012 1104   NA 135* 02/06/2012 0942   K 4.7 02/29/2012 1104   K 4.0 02/06/2012 0942   CL 97 02/29/2012 1104   CL 97* 02/06/2012 0942   CO2 22 02/29/2012 1104   CO2 25 02/06/2012 0942   GLUCOSE 107* 02/29/2012 1104   GLUCOSE 149* 02/06/2012 0942   BUN 16 02/29/2012 1104   BUN 19.0 02/06/2012 0942   CREATININE 0.63 02/29/2012 1104   CREATININE 1.3 02/06/2012 0942   CALCIUM 9.3 02/29/2012 1104   CALCIUM 10.3 02/06/2012 0942   GFRNONAA >90 02/29/2012 1104   GFRAA >90 02/29/2012 1104   CBC    Component Value Date/Time   WBC 12.4* 02/29/2012 1104   WBC 35.3* 02/06/2012 0942   RBC 3.60* 02/29/2012 1104   RBC 4.48 02/06/2012 0942   HGB 10.5* 02/29/2012 1104   HGB 13.5 02/06/2012 0942   HCT 31.8* 02/29/2012 1104   HCT 39.0 02/06/2012 0942   PLT 481* 02/29/2012 1104   PLT 432* 02/06/2012 0942   MCV 88.3 02/29/2012 1104   MCV 87.0 02/06/2012 0942   MCH 29.2 02/29/2012 1104   MCH  30.0 02/06/2012 0942   MCHC 33.0 02/29/2012 1104   MCHC 34.5 02/06/2012 0942   RDW 14.0 02/29/2012 1104   RDW 14.5 02/06/2012 0942   LYMPHSABS 1.4 02/26/2012 0500   LYMPHSABS 1.0 02/06/2012 0942   MONOABS 1.0 02/26/2012 0500   MONOABS 2.2* 02/06/2012 0942   EOSABS 0.4 02/26/2012 0500   EOSABS 0.2 02/06/2012 0942   BASOSABS 0.1 02/26/2012 0500   BASOSABS 0.1 02/06/2012 0942   Lab Results  Component Value Date   ALT 99* 02/26/2012   AST 48* 02/26/2012   ALKPHOS 98 02/26/2012   BILITOT 0.3 02/26/2012    Studies/Results: No results found.  Medications: I have reviewed the patient's current medications.  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 Pt with central cord clinical picture. He is s/p surgical decompression which require 3+ hours per day of interdisciplinary therapy in a comprehensive inpatient rehab setting.  Physiatrist is providing close team supervision and 24 hour management of active medical problems listed below.  Physiatrist and rehab team continue to assess barriers to discharge/monitor patient progress toward functional and medical goals. 1. 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  2. Pain Management: Cervical collar prn for  comfort. He claims a lot of his pain is "rectal"  3. Mood: Seems to have a good outlook. Will monitor along. LCSW  following.  4. Neuropsych: This patient is capable of making decisions on his/her own behalf.  5. Disseminated MRSA: S/P evacuation of abscess and minithorocotomy -ongoing Vancomycin 6 weeks post-op (started 1/23)  6. Anemia due to infection/critical illness:  On iron supplement.  7. Hyponatremia: stable-  8. Abnormal LFTs: Likely due to sepsis. No GI symptoms. Stable  9. Neurogenic bowel/bladder- stools more formed  - am suppository helping with continence  -probiotic added  -continue foley for now  10. Wound care: continue vac to chest wall   -continue santyl to sacral wound daily  -pt is directing pressure relief, turning  -air overlay for mattress    Length of stay, days: 6  Rene Paci , MD  03/02/2012, 8:52 AM

## 2012-03-02 NOTE — Progress Notes (Signed)
Physical Therapy Session Note  Patient Details  Name: [name] MRN: [id number] Date of Birth: 1960/08/08  Today's Date: 03/02/2012 Time: 8295-6213 Time Calculation (min): 53 min   Skilled Therapeutic Interventions W/C mobility on a distance of 2 x 100 feet with multiple rest breaks due to easy fatigue.Patient uses B UE for propulsion. Rolling in bed to R -contact guard, L- minA, uses hand rails on the bed. Supine to sit on EOB with mod A, decreased ability to assess situation for safety, impulsive movements,putting patient at risk for falls. Sitting EOB min/modA A due to poor postural control, patient leans back and looses all control , not able to correct his position. Sliding board transfer in/out of bed with mod A, air mattress has not been deflated which added to the difficulty of transfer. In II bars sit to stand with max A 1 x 15 seconds, 1 x 20, 1 x 45 seconds with cues for weight shifts and selective knee extensions. Stand to sit  Is impulsive max  required, patient falls back , after providing reasoning why it is important to maintain control during sitting, able to perform with mod A. Training in performing bed mobility to assure no further skin breakdown.  Bridging 1 x 10 Therapy Documentation Precautions:  Precautions Precautions: Cervical;Fall Required Braces or Orthoses: Cervical Brace Cervical Brace: Soft collar Restrictions Weight Bearing Restrictions: No General:   Vital Signs: Therapy Vitals Temp: 97.5 F (36.4 C) Temp src: Oral Pulse Rate: 83 BP: 173/91 mmHg Patient Position, if appropriate: Sitting Oxygen Therapy O2 Device: None (Room air) Pain: Pain Assessment Pain Assessment: 0-10 Pain Score:   6 Pain Type: Acute pain Pain Location: Neck Pain Orientation: Upper Pain Descriptors: Aching;Sore Pain Frequency: Intermittent Pain Onset: Gradual Patients Stated Pain Goal: 2 Pain Intervention(s): Medication (See eMAR)    Therapy/Group:  Individual Therapy  Dorna Mai 03/02/2012, 2:49 PM

## 2012-03-03 ENCOUNTER — Inpatient Hospital Stay (HOSPITAL_COMMUNITY): Payer: Medicare HMO | Admitting: Occupational Therapy

## 2012-03-03 ENCOUNTER — Inpatient Hospital Stay (HOSPITAL_COMMUNITY): Payer: Medicare HMO

## 2012-03-03 DIAGNOSIS — G061 Intraspinal abscess and granuloma: Secondary | ICD-10-CM

## 2012-03-03 DIAGNOSIS — J9600 Acute respiratory failure, unspecified whether with hypoxia or hypercapnia: Secondary | ICD-10-CM

## 2012-03-03 DIAGNOSIS — L89109 Pressure ulcer of unspecified part of back, unspecified stage: Secondary | ICD-10-CM

## 2012-03-03 DIAGNOSIS — G8250 Quadriplegia, unspecified: Secondary | ICD-10-CM

## 2012-03-03 LAB — CBC
HCT: 31.6 % — ABNORMAL LOW (ref 39.0–52.0)
Hemoglobin: 10.3 g/dL — ABNORMAL LOW (ref 13.0–17.0)
RDW: 13.8 % (ref 11.5–15.5)
WBC: 8.2 10*3/uL (ref 4.0–10.5)

## 2012-03-03 MED ORDER — BOOST / RESOURCE BREEZE PO LIQD
1.0000 | Freq: Three times a day (TID) | ORAL | Status: DC
  Administered 2012-03-03 – 2012-03-18 (×29): 1 via ORAL

## 2012-03-03 NOTE — Progress Notes (Signed)
Occupational Therapy Session Note  Patient Details  Name: Frank Brock MRN: [id number] Date of Birth: [birth year]  Today's Date: 03/03/2012 1030-1130 - 60 Minutes  Short Term Goals: Week 1:  OT Short Term Goal 1 (Week 1): Patient will complete UB dressing sitting edge of bed with moderate assistance OT Short Term Goal 2 (Week 1): Patient will complete LB dressing in supine position with maximal assistance OT Short Term Goal 3 (Week 1): Patient will perform grooming tasks seated at sink with miminal assistance OT Short Term Goal 4 (Week 1): BSC will be introduced to patient for bowel program with nursing  Skilled Therapeutic Interventions/Progress Updates:  Patient found supine in bed with no complaints of pain. Patient engaged in bed mobility to re-position in bed. Therapist then put patient in chair position to help increase patient's independence with LB dressing. Patient able to thread BLEs into pants and pull pants up to waist. Patient attempted to donn bilateral socks and shoes, needed assistance with both. Patient then worked on bed mobility to sit edge of bed for UB dressing and edge of bed -> w/c slide board transfer (mod assist for transfer). Patient then sat at sink for grooming tasks of washing face, washing hands, brushing teeth, and combing hair. Patient propelled self -> therapy gym for next therapy session. Therapist administered some red foam to build up handles for self-feeding and grooming tasks prn.   Precautions:  Precautions Precautions: Cervical;Fall Required Braces or Orthoses: Cervical Brace Cervical Brace: Soft collar Restrictions Weight Bearing Restrictions: No  See FIM for current functional status  Therapy/Group: Individual Therapy  Ethne Jeon 03/03/2012, 12:05 PM

## 2012-03-03 NOTE — Plan of Care (Signed)
Problem: SCI BLADDER ELIMINATION Goal: RH STG MANAGE BLADDER WITH ASSISTANCE STG Manage Bladder With max Assistance  Outcome: Progressing Foley cath intact     

## 2012-03-03 NOTE — Progress Notes (Signed)
Subjective/Complaints: Sacrum less sore. Moving better. Happy with progress overall A 12 point review of systems has been performed and if not noted above is otherwise negative.   Objective: Vital Signs: Blood pressure 130/75, pulse 71, temperature 97.4 F (36.3 C), temperature source Oral, resp. rate 18, weight 72.6 kg (160 lb 0.9 oz), SpO2 98.00%. No results found.  Recent Labs  02/29/12 1104 03/03/12 0600  WBC 12.4* 8.2  HGB 10.5* 10.3*  HCT 31.8* 31.6*  PLT 481* 402*    Recent Labs  02/29/12 1104  NA 130*  K 4.7  CL 97  GLUCOSE 107*  BUN 16  CREATININE 0.63  CALCIUM 9.3   CBG (last 3)  No results found for this basename: GLUCAP,  in the last 72 hours  Wt Readings from Last 3 Encounters:  03/02/12 72.6 kg (160 lb 0.9 oz)  02/25/12 76.2 kg (167 lb 15.9 oz)  02/25/12 76.2 kg (167 lb 15.9 oz)    Physical Exam:  Constitutional: He is oriented to person, place, and time. He appears well-developed. He has a sickly appearance.  Cardiovascular: Normal rate and regular rhythm.  Pulmonary/Chest: Effort normal. He has rales in the left middle field.  Abdominal: Soft. Bowel sounds are normal.  Musculoskeletal: He exhibits no tenderness.  Minimal edema left forearm. Bilateral heels boggy--non tender.  Neurological: He is alert and oriented to person, place, and time. A bit distracted but generally appropriate  decreased sensation rectally. Decreased sensation to LT in both hands and feet.  DTR's 1+, no resting tone. Good voice/phonation, cognition is generally intact.  MOTOR TESTING:  Muscle Right Left  Deltoid 3  3  Triceps 3  3  Biceps 3  3 Wrist ext 3 3 HI 3 3  HF 3 3  KE 3 3  ADF 3 3  APF 3 3  Skin:  Posterior neck incision clean and dry with steri strips in place. Lower chest wall with sutures anteriorly. Upper left chest wall with VAC in place and sealed. Sacrum with small area fibronecrotic tissue near sacrum which really hasn't changed over the last few  days.   Assessment/Plan: 1. 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 Pt with central cord clinical picture. He is s/p surgical decompression which require 3+ hours per day of interdisciplinary therapy in a comprehensive inpatient rehab setting. Physiatrist is providing close team supervision and 24 hour management of active medical problems listed below. Physiatrist and rehab team continue to assess barriers to discharge/monitor patient progress toward functional and medical goals. FIM: FIM - Bathing Bathing Steps Patient Completed: Chest;Right Arm;Left Arm;Abdomen Bathing: 2: Max-Patient completes 3-4 52f 10 parts or 25-49%  FIM - Upper Body Dressing/Undressing Upper body dressing/undressing steps patient completed: Thread/unthread right sleeve of pullover shirt/dresss;Thread/unthread left sleeve of pullover shirt/dress Upper body dressing/undressing: 3: Mod-Patient completed 50-74% of tasks FIM - Lower Body Dressing/Undressing Lower body dressing/undressing: 1: Total-Patient completed less than 25% of tasks  FIM - Toileting Toileting: 0: Activity did not occur  FIM - Archivist Transfers: 0-Activity did not occur  FIM - Banker Devices: Bed rails;Sliding board Bed/Chair Transfer: 2: Supine > Sit: Max A (lifting assist/Pt. 25-49%);1: Bed > Chair or W/C: Total A (helper does all/Pt. < 25%);1: Two helpers;1: Chair or W/C > Bed: Total A (helper does all/Pt. < 25%)  FIM - Locomotion: Wheelchair Distance: 90 Locomotion: Wheelchair: 2: Travels 50 - 149 ft with supervision, cueing  or coaxing FIM - Locomotion: Ambulation Locomotion: Ambulation: 0: Activity did not occur  Comprehension Comprehension Mode: Auditory Comprehension: 5-Follows basic conversation/direction: With no assist  Expression Expression Mode: Verbal Expression: 5-Expresses basic  needs/ideas: With no assist  Social Interaction Social Interaction: 7-Interacts appropriately with others - No medications needed.  Problem Solving Problem Solving: 5-Solves basic problems: With no assist  Memory Memory: 6-More than reasonable amt of time  Medical Problem List and Plan:  1. 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  2. Pain Management: Cervical collar prn for comfort. He claims a lot of his pain is "rectal" 3. Mood: Seems to have a good outlook. Will monitor along. LCSW to follow for formal evaluation.  4. Neuropsych: This patient is capable of making decisions on his/her own behalf.  5. Disseminated MRSA: S/P evacuation of abscess and minithorocotomy Vancomycin 6 weeks post-op from (1/23)  6. Anemia due to infection/crutical illness: Added iron supplement.  7. Hyponatremia: stable- recheck tomorrow 8. Abnormal LFTs: Likely due to sepsis. No GI symptoms. Stable to improved today 9. Neurogenic bowel/bladder- stools more formed  - am suppository helping with continence  -probiotic  added  -continue foley for now 10. Wound care: continue vac to chest wall  -continue santyl to sacral wound daily  -pt is directing pressure relief, turning  -air overlay for mattress--dc as he's able to turn on own.  -will have hydrotherapy work on sacral wound  LOS (Days) 7 A FACE TO FACE EVALUATION WAS PERFORMED  Frank Brock T 03/03/2012 8:19 AM

## 2012-03-03 NOTE — Progress Notes (Signed)
NUTRITION FOLLOW UP  Patient meets criteria for Severe malnutrition related to acute illness AEB >20 lb weight loss in the last 2 weeks (12%) and decreased muscle mass and body fat.   Intervention:   Continue Beneprotein TID D/C Ensure Resource Breeze TID  Nutrition Dx:   Inadequate oral intake related to poor appetite as evidenced by documentation; progressing  Goal:   Pt to meet >/= 90% of their estimated nutrition needs; progressing.   Monitor:   PO intake, weight, wounds  Assessment:   Pt admitted 02/08/2012 with hypoxemic respiratory failure. He was admitted by pulmonary critical care medicine. During the hospitalization his respiratory status worsened and followup x-ray revealed bilateral pulmonary infiltrates. CT of the chest also revealed a large left-sided chest wall abscess that communicated with an intrathoracic pulmonary abscess. Also interval development of rapidly progressive bilateral pulmonary infiltrates c/w infiltrates and septic emboli. Thoracic surgery consultation was obtained and patient was urgently transferred to [facility] for emergency surgery for chest wall abscess presumed staphylococcal etiology. He subsequently underwent incision and drainage of the chest wall abscess and mini-thoracotomy with placement of wound VAC and insertion of left chest tube on 02/10/2012. Pt was extubated on 1/26.  An MRI revealed an epidural compressive lesion in the cervical spinal cord with edema. Patient subsequently underwent posterior cervical laminectomy for an epidural abscess at C3-3-C 7 on 02/14/2012.  Pt was discharged to rehab on 2/3. VAC changes are scheduled every MWF.    Height: Ht Readings from Last 1 Encounters:  02/10/12 5\' 10"  (1.778 m)    Weight Status:  Admission weight 159 lb Wt Readings from Last 1 Encounters:  03/02/12 160 lb 0.9 oz (72.6 kg)    Estimated needs:  Kcal: 2200-2300  Protein: 110-130 gm  Fluid: 2.2-2.3L  Skin: Chest wound with  VAC, unstageable pressure ulcer on coccyx  Diet Order: Cardiac; Ensure Complete BID and beneprotein TID Meal Completion: 75-100%   Intake/Output Summary (Last 24 hours) at 03/03/12 1510 Last data filed at 03/03/12 1400  Gross per 24 hour  Intake   1160 ml  Output   3250 ml  Net  -2090 ml    Last BM: 2/10   Labs:   Recent Labs Lab 02/26/12 0500 02/29/12 1104  NA 133* 130*  K 4.1 4.7  CL 99 97  CO2 21 22  BUN 8 16  CREATININE 0.53 0.63  CALCIUM 9.1 9.3  GLUCOSE 113* 107*    CBG (last 3)  No results found for this basename: GLUCAP,  in the last 72 hours  Scheduled Meds: . alteplase  2 mg Intracatheter Once  . amLODipine  10 mg Oral Daily  . antiseptic oral rinse  1 application Mouth Rinse QID  . bisacodyl  10 mg Rectal Q0600  . chlorhexidine  15 mL Mouth Rinse BID  . collagenase   Topical Daily  . diphenhydrAMINE  50 mg Oral QHS  . enoxaparin (LOVENOX) injection  70 mg Subcutaneous Q12H  . feeding supplement  237 mL Oral BID BM  . magic mouthwash  5 mL Oral QID  . protein supplement  1 scoop Oral TID WC  . saccharomyces boulardii  250 mg Oral BID  . vancomycin  1,250 mg Intravenous Q12H    Continuous Infusions:   Kendell Bane RD, LDN, CNSC [phone] Pager [phone] After Hours Pager

## 2012-03-03 NOTE — Consult Note (Signed)
WOC follow up Wound type: surgical s/p debridement left chest wall Wound bed:100% granulation tissue, could not identify any deeper pockets today Drainage (amount, consistency, odor) serosanguinous in the canister Periwound:intact Dressing procedure/placement/frequency: 1pc of black granufoam cut to fit to fill wound bed, larger mushroom of black foam used to protect pts skin from the [facility] pad, as it is now larger than the wound.  Seal obtained at , pt tolerated well.  Will ask CVTS MD to see pts wound this week and possible dc VAC to and start hydrogel dressings daily.  WOC will follow along with you for [facility] dressing assistance.  Stephane Junkins [place] RN,CWOCN [phone]

## 2012-03-03 NOTE — Plan of Care (Signed)
Problem: RH SAFETY Goal: RH STG ADHERE TO SAFETY PRECAUTIONS W/ASSISTANCE/DEVICE STG Adhere to Safety Precautions With Assistance/Device. Supervision  Outcome: Progressing No unsafe behavior noted     

## 2012-03-03 NOTE — Plan of Care (Signed)
Problem: RH SKIN INTEGRITY Goal: RH STG SKIN FREE OF INFECTION/BREAKDOWN Skin free of infection/ breakdown  Outcome: Not Progressing Stage 2 pressure ulcer, present on admission

## 2012-03-03 NOTE — Progress Notes (Signed)
Physical Therapy Session Note  Patient Details  Name: Frank Brock MRN: [id number] Date of Birth: 05-21-1960  Today's Date: 03/03/2012 Time: 5784-6962 Time Calculation (min): 27 min  Short Term Goals: Week 1:  PT Short Term Goal 1 (Week 1): Pt will be able to demonstrate bed mobility with mod A PT Short Term Goal 2 (Week 1): Pt will be able to transfer with mod A PT Short Term Goal 3 (Week 1): Pt will be able to demonstrate dynamic sitting balance with mod A during functional task PT Short Term Goal 4 (Week 1): Pt will be able to propel w/c x 100' with S  Skilled Therapeutic Interventions/Progress Updates:   Pt reports increased mobility in hands with fine motor tasks and pleased with overall functional gains. Focused on slide board transfers with min/mod A and Nustep with LE's only for increased strengthening and overall endurance on level 3 x 5 min. Pt reports increased fatigue today.   Therapy Documentation Precautions:  Precautions Precautions: Cervical;Fall Required Braces or Orthoses: Cervical Brace Cervical Brace: Soft collar Restrictions Weight Bearing Restrictions: No  Pain: Premedicated for neck pain.  See FIM for current functional status  Therapy/Group: Individual Therapy  [name] [facility] 03/03/2012, 12:06 PM

## 2012-03-03 NOTE — Progress Notes (Signed)
Occupational Therapy Session Note  Patient Details  Name: Frank Brock MRN: [id number] Date of Birth: 1960-07-21  Today's Date: 03/03/2012 Time: 1405-1500 Time Calculation (min): 55 min  Short Term Goals: Week 1:  OT Short Term Goal 1 (Week 1): Patient will complete UB dressing sitting edge of bed with moderate assistance OT Short Term Goal 2 (Week 1): Patient will complete LB dressing in supine position with maximal assistance OT Short Term Goal 3 (Week 1): Patient will perform grooming tasks seated at sink with miminal assistance OT Short Term Goal 4 (Week 1): BSC will be introduced to patient for bowel program with nursing  Skilled Therapeutic Interventions/Progress Updates:  Patient sleeping in bed upon arrival.  Engaged in bed><drop arm commode transfers with squat pivot and without use of slide board, discussed positioning options for pants up and down to include lateral leans, reviewed contents of SCI education binder with focus on Bowel Program.  Patient unable to fully participate in session due to pain issues and requested to go back to bed.  Encouraged tolerate HOB up while engaged in SCI education related to bowel program and other topics in education binder.  Patient unable to tolerate this position for long due paini in neck and sacrum.  Patient assisted into right sidelying to improve comfort.  Issued theraputty as patient reports I had some however was unable to locate it in patient's room.  Therapy Documentation Precautions:  Precautions Precautions: Cervical;Fall Required Braces or Orthoses: Cervical Brace Cervical Brace: Soft collar Restrictions Weight Bearing Restrictions: No Pain: 8/10 - 10/10, neck, back and bottom, premedicated, adapted session to accomodate See FIM for current functional status  Therapy/Group: Individual Therapy  Pleasant Bensinger 03/03/2012, 4:20 PM

## 2012-03-03 NOTE — Plan of Care (Signed)
Problem: SCI BOWEL ELIMINATION Goal: RH STG SCI MANAGE BOWEL WITH MEDICATION WITH ASSISTANCE STG SCI Manage bowel with medication with Max assistance.  Outcome: Progressing 0600 bowel program

## 2012-03-03 NOTE — Progress Notes (Signed)
Physical Therapy Session Note  Patient Details  Name: Frank Brock MRN: [id number] Date of Birth: Feb 04, 1960  Today's Date: 03/03/2012 Time: 0865-7846 Time Calculation (min): 40 min  Short Term Goals: Week 1:  PT Short Term Goal 1 (Week 1): Pt will be able to demonstrate bed mobility with mod A PT Short Term Goal 2 (Week 1): Pt will be able to transfer with mod A PT Short Term Goal 3 (Week 1): Pt will be able to demonstrate dynamic sitting balance with mod A during functional task PT Short Term Goal 4 (Week 1): Pt will be able to propel w/c x 100' with S  Skilled Therapeutic Interventions/Progress Updates:    Focused session on bed mobility, slide board transfers, w/c mobility for endurance and strengthening, neuro re-ed in standing frame for postural control and equal weightbearing (tending to lateral lean to the R). Pt able to tolerate standing x 2 attempts in standing frame for about 30 seconds to 1 minute before needing seated break. Pt with incontinent BM while standing, returned back to room and transferred back to bed. Nursing staff present to assist with clean up.  Therapy Documentation Precautions:  Precautions Precautions: Cervical;Fall Required Braces or Orthoses: Cervical Brace Cervical Brace: Soft collar Restrictions Weight Bearing Restrictions: No    Pain:  Premedicated - c/o neck and bottom pain.  See FIM for current functional status  Therapy/Group: Individual Therapy  Frank Brock [facility] 03/03/2012, 1:44 PM

## 2012-03-04 ENCOUNTER — Inpatient Hospital Stay (HOSPITAL_COMMUNITY): Payer: Medicare HMO

## 2012-03-04 ENCOUNTER — Inpatient Hospital Stay (HOSPITAL_COMMUNITY): Payer: Medicare HMO | Admitting: Occupational Therapy

## 2012-03-04 ENCOUNTER — Inpatient Hospital Stay (HOSPITAL_COMMUNITY): Payer: Medicare HMO | Admitting: Physical Therapy

## 2012-03-04 MED ORDER — ENOXAPARIN SODIUM 60 MG/0.6ML ~~LOC~~ SOLN
60.0000 mg | Freq: Two times a day (BID) | SUBCUTANEOUS | Status: DC
  Administered 2012-03-04 – 2012-03-20 (×32): 60 mg via SUBCUTANEOUS
  Filled 2012-03-04 (×37): qty 0.6

## 2012-03-04 NOTE — Progress Notes (Signed)
Making good progress in Rehab.

## 2012-03-04 NOTE — Progress Notes (Signed)
Subjective/Complaints: Sacrum less sore. Likes that overlay mattress is gone A 12 point review of systems has been performed and if not noted above is otherwise negative.   Objective: Vital Signs: Blood pressure 114/69, pulse 70, temperature 98.1 F (36.7 C), temperature source Oral, resp. rate 18, weight 58.5 kg (128 lb 15.5 oz), SpO2 97.00%. No results found.  Recent Labs  03/03/12 0600  WBC 8.2  HGB 10.3*  HCT 31.6*  PLT 402*   No results found for this basename: NA, K, CL, CO, GLUCOSE, BUN, CREATININE, CALCIUM,  in the last 72 hours CBG (last 3)  No results found for this basename: GLUCAP,  in the last 72 hours  Wt Readings from Last 3 Encounters:  03/04/12 58.5 kg (128 lb 15.5 oz)  02/25/12 76.2 kg (167 lb 15.9 oz)  02/25/12 76.2 kg (167 lb 15.9 oz)    Physical Exam:  Constitutional: He is oriented to person, place, and time. He appears well-developed. He has a sickly appearance.  Cardiovascular: Normal rate and regular rhythm.  Pulmonary/Chest: Effort normal. He has rales in the left middle field.  Abdominal: Soft. Bowel sounds are normal.  Musculoskeletal: He exhibits no tenderness.  Minimal edema left forearm. Bilateral heels boggy--non tender.  Neurological: He is alert and oriented to person, place, and time. A bit distracted but generally appropriate  decreased sensation rectally. Decreased sensation to LT in both hands and feet.  DTR's 1+, no resting tone. Good voice/phonation, cognition is generally intact.  MOTOR TESTING:  Muscle Right Left  Deltoid 3+ 3 + Triceps 3+  3 + Biceps 3+  3+ Wrist ext 3+ 3+ HI 3 3  HF 3 3  KE 3 3  ADF 3+ 3+  APF 3+ 3+  Skin:  Posterior neck incision clean and dry with steri strips in place. Lower chest wall with sutures anteriorly. Upper left chest wall with VAC in place and sealed. Sacrum with small area fibronecrotic tissue near sacrum which really hasn't changed over the last few days.   Assessment/Plan: 1. 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 Pt with central cord clinical picture. He is s/p surgical decompression which require 3+ hours per day of interdisciplinary therapy in a comprehensive inpatient rehab setting. Physiatrist is providing close team supervision and 24 hour management of active medical problems listed below. Physiatrist and rehab team continue to assess barriers to discharge/monitor patient progress toward functional and medical goals. FIM: FIM - Bathing Bathing Steps Patient Completed: Chest;Right Arm;Left Arm;Abdomen Bathing: 0: Activity did not occur  FIM - Upper Body Dressing/Undressing Upper body dressing/undressing steps patient completed: Thread/unthread right sleeve of pullover shirt/dresss;Thread/unthread left sleeve of pullover shirt/dress Upper body dressing/undressing: 3: Mod-Patient completed 50-74% of tasks FIM - Lower Body Dressing/Undressing Lower body dressing/undressing steps patient completed: Thread/unthread right pants leg;Thread/unthread left pants leg Lower body dressing/undressing: 1: Total-Patient completed less than 25% of tasks  FIM - Toileting Toileting: 0: Activity did not occur  FIM - Diplomatic Services operational officer Devices: Bedside commode (drop arm) Toilet Transfers: 3-To toilet/BSC: Mod A (lift or lower assist);2-From toilet/BSC: Max A (lift and lower assist) (squat/scoot pivot)  FIM - Banker Devices: Sliding board;Arm rests;Bed rails Bed/Chair Transfer: 3: Supine > Sit: Mod A (lifting assist/Pt. 50-74%/lift 2 legs;3: Sit > Supine: Mod A (lifting assist/Pt. 50-74%/lift 2 legs);3: Bed > Chair or W/C: Mod A (lift or lower assist);4: Chair or W/C > Bed: Min  A (steadying Pt. > 75%)  FIM - Locomotion: Wheelchair Distance: 90 Locomotion: Wheelchair: 2: Travels 50 - 149 ft with supervision, cueing or coaxing FIM -  Locomotion: Ambulation Locomotion: Ambulation: 0: Activity did not occur  Comprehension Comprehension Mode: Auditory Comprehension: 5-Understands complex 90% of the time/Cues < 10% of the time  Expression Expression Mode: Verbal Expression: 5-Expresses basic needs/ideas: With no assist  Social Interaction Social Interaction: 6-Interacts appropriately with others with medication or extra time (anti-anxiety, antidepressant).  Problem Solving Problem Solving: 5-Solves complex 90% of the time/cues < 10% of the time  Memory Memory: 6-More than reasonable amt of time  Medical Problem List and Plan:  1. 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  2. Pain Management: Cervical collar prn for comfort. He claims a lot of his pain is "rectal" 3. Mood: Seems to have a good outlook. Will monitor along. LCSW to follow for formal evaluation.  4. Neuropsych: This patient is capable of making decisions on his/her own behalf.  5. Disseminated MRSA: S/P evacuation of abscess and minithorocotomy Vancomycin 6 weeks post-op from (1/23)  6. Anemia due to infection/crutical illness: Added iron supplement.  7. Hyponatremia: stable- recheck tomorrow 8. Abnormal LFTs: Likely due to sepsis. No GI symptoms. Stable to improved today 9. Neurogenic bowel/bladder- stools more formed  - am suppository helping with continence  -probiotic  added  -continue foley for now--will discuss with RN re voiding trial 10. Wound care: continue vac to chest wall--hopefully dc this week  -continue santyl to sacral wound daily  -pt is directing pressure relief, turning  -air overlay for mattress--dc as he's able to turn on own.  -hydrotherapy to begin working on sacral wound today  LOS (Days) 8 A FACE TO FACE EVALUATION WAS PERFORMED  SWARTZ,ZACHARY T 03/04/2012 8:10 AM

## 2012-03-04 NOTE — Progress Notes (Signed)
Occupational Therapy Session Note & Progress Note  Patient Details  Name: Frank Brock MRN: [id number] Date of Birth: 02-11-1960  Today's Date: 03/04/2012  SESSION NOTE Time: 4098-1191 Time Calculation (min): 55 min Individual Therapy Patient with complaints of "gas pain", no medication needed at this time Patient found supine in bed. Patient with accident regarding water spillage in bed. Patient stated he wanted to keep his pants and shirt on at this time (water did not get on clothes). Patient worked on bed mobility and transferred edge of bed -> w/c barely using slide board with minimal assistance. Patient then sat at sink for grooming tasks of washing hands, washing face, brushing teeth, washing hair, and brushing hair. After grooming tasks patient propelled self -> therapy gym for therapeutic exercise focusing on bilateral UEs using SCIFIT machine. Therapist propelled patient back to room at end of session and left him seated in w/c with call bell & phone within reach.    ------------------------------------------------------------------------------------------------------------------------------  WEEKLY PROGRESS NOTE Patient has met 4 of 4 short term goals.  Patient is making good progress on CIR. Patient is motivated to be as independent as possible and has been working hard to gain back his independence. Patient is starting to functional use bilateral hands to dial numbers on his phone, talk on his phone, use his laptop, perform grooming tasks, perform self-feeding tasks, etc. Patient's transfer independence has improved from total assist X2 -> min-mod assist using a slide board. BSC transfers have been introduced and practiced with patient. Estimated d/c date =2/28.   Patient continues to demonstrate the following deficits: decreased functional use of bilateral UEs, decreased independence with functional mobility, decreased independence with transfers, increased pain, decreased  dynamic sitting balance/tolerance/endurance. Therefore, patient will continue to benefit from skilled OT intervention to enhance overall performance with BADL, iADL and Reduce care partner burden.  Patient progressing toward long term goals..  Continue plan of care.  OT Short Term Goals Week 1:  OT Short Term Goal 1 (Week 1): Patient will complete UB dressing sitting edge of bed with moderate assistance OT Short Term Goal 1 - Progress (Week 1): Met OT Short Term Goal 2 (Week 1): Patient will complete LB dressing in supine position with maximal assistance OT Short Term Goal 2 - Progress (Week 1): Met OT Short Term Goal 3 (Week 1): Patient will perform grooming tasks seated at sink with miminal assistance OT Short Term Goal 3 - Progress (Week 1): Met OT Short Term Goal 4 (Week 1): BSC will be introduced to patient for bowel program with nursing OT Short Term Goal 4 - Progress (Week 1): Met  Week 2:  OT Short Term Goal 1 (Week 2): Patient will donn shirt seated edge of bed with minimal assistance OT Short Term Goal 2 (Week 2): Patient will complete LB dressing with moderate assistance at bed level OT Short Term Goal 3 (Week 2): Patient will be introduced to a HEP for BUEs OT Short Term Goal 4 (Week 2): Patient will perform toilet transfers with moderate assistance  Skilled Therapeutic Interventions/Progress Updates:  Balance/vestibular training;Community reintegration;Discharge planning;Disease mangement/prevention;DME/adaptive equipment instruction;Functional mobility training;Neuromuscular re-education;Pain management;Psychosocial support;Patient/family education;Self Care/advanced ADL retraining;Skin care/wound managment;Splinting/orthotics;Therapeutic Activities;Therapeutic Exercise;UE/LE Strength taining/ROM;UE/LE Coordination activities;Wheelchair propulsion/positioning   Precautions:  Precautions Precautions: Cervical;Fall Required Braces or Orthoses: Cervical Brace Cervical Brace: Soft  collar Restrictions Weight Bearing Restrictions: No  See FIM for current functional status  Frank Brock 03/04/2012, 10:48 AM

## 2012-03-04 NOTE — Patient Care Conference (Signed)
Inpatient RehabilitationTeam Conference and Plan of Care Update Date: 03/04/2012   Time: 2:00  PM    Patient Name: Frank Brock      Medical Record Number: [id number]  Date of Birth: 11-22-1960 Sex: Male         Room/Bed: 4035/4035-01 Payor Info: Payor: [employer] MEDICARE  Plan: [employer] MEDICARE HMO/PPO  Product Type: *No Product type*     Admitting Diagnosis: CORD COMPRESSION WITH QUADRAPLEGIA  Admit Date/Time:  02/25/2012  5:31 PM Admission Comments: No comment available   Primary Diagnosis:  Quadriplegia Principal Problem: Quadriplegia  Patient Active Problem List   Diagnosis Date Noted  . Sepsis 02/19/2012  . 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 Hypokalemia 02/19/2012  . Anemia 02/19/2012  . Acute hyperglycemia 02/19/2012  . DVT of upper extremity (deep vein thrombosis) 02/14/2012  . Quadriplegia 02/14/2012  . Bacteremia due to methicillin resistant Staphylococcus aureus 02/11/2012  . S/P VATS (1/19) 02/11/2012  . Chest wall abscess 02/10/2012  . Lung abscess 02/10/2012  . Acute respiratory failure with hypoxia 02/08/2012  . Hypoxemia 02/08/2012  . H/O HTN 02/06/2012  . H/O hypercholesteremia 02/06/2012  . H/O stroke 02/06/2012    Expected Discharge Date: Expected Discharge Date: 03/21/12  Team Members Present: Nurse Present: Daryll Brod, RN PT Present: [name], PT OT Present: Edwin Cap, OT Other (Discipline and Name): Tora Duck, PPS Coordinator     Current Status/Progress Goal Weekly Team Focus  Medical   remains on abx, recovering neuro function  working on bowel program, pain control  skin care, dc vac this week   Bowel/Bladder   Incontinent of bowel. LBM 03/03/12. Foley to straight drain with amber color urine  Managed bowel and bladder program  Continue with bowel program   Swallow/Nutrition/ Hydration             ADL's   mod assist for UB dressing, max-total for LB dressing, mod assist for slide board transfers, max assist for bed  mobility (supine->sit)  overall supervision -> min assist exept mod assist for shower transfers  ADL retraining, bed mobility, dynamic sitting balance, transfers, activity tolerance/endurance, functional use of bilateral UEs   Mobility   mod A w/c level; total A sit to stand/standing frame  min A with transfers; mod I w/c mobility  activity tolerance, OOB tolerance, transfers, w/c mobilty, standing   Communication             Safety/Cognition/ Behavioral Observations            Pain   Requests Oxy IR 10mg  q 4hrs. Robaxin 500mg  q 6hrs, prn and Tramadol 100mg  q 6hrs  <4  Offer pain medicaiton 1hr prior to initial physical therapy and wound therapy treatment    Skin   Stage 2 pressure ulcer with allevyn dressing intact.Wound vac to R chest, CDI  No additional skin breakdown  Routine turn q 2hrs    Rehab Goals Patient on target to meet rehab goals: Yes *See Interdisciplinary Assessment and Plan and progress notes for long and short-term goals  Barriers to Discharge: ongoing neurological deficits, bowel and bladder incontinence    Possible Resolutions to Barriers:  timed emptying, continued strength training and NMR    Discharge Planning/Teaching Needs:  Home with family to build ramp and to provide 24/7 assistance      Team Discussion:  Making good progress with therapies and regaining strength.  Still addressing bowel issues.  Plan to remove foley and hope for wound VAC to be d/c'd this week  as well.  Most transfers are still requiring supervision due to patient's impulsivity.  Revisions to Treatment Plan:  Adding hydrotherapy this week.  Hope to d/c VAC this week.   Continued Need for Acute Rehabilitation Level of Care: The patient requires daily medical management by a physician with specialized training in physical medicine and rehabilitation for the following conditions: Daily direction of a multidisciplinary physical rehabilitation program to ensure safe treatment while eliciting  the highest outcome that is of practical value to the patient.: Yes Daily medical management of patient stability for increased activity during participation in an intensive rehabilitation regime.: Yes Daily analysis of laboratory values and/or radiology reports with any subsequent need for medication adjustment of medical intervention for : Post surgical problems;Neurological problems;Other (skin care needs, pain mg)  Mauro Arps 03/04/2012, 4:10 PM

## 2012-03-04 NOTE — Progress Notes (Signed)
Physical Therapy Session Note  Patient Details  Name: Frank Brock MRN: [id number] Date of Birth: 08-30-1960  Today's Date: 03/04/2012 Time: 4098-1191 Time Calculation (min): 58 min  Short Term Goals: Week 1:  PT Short Term Goal 1 (Week 1): Pt will be able to demonstrate bed mobility with mod A PT Short Term Goal 1 - Progress (Week 1): Not met PT Short Term Goal 2 (Week 1): Pt will be able to transfer with mod A PT Short Term Goal 2 - Progress (Week 1): Met PT Short Term Goal 3 (Week 1): Pt will be able to demonstrate dynamic sitting balance with mod A during functional task PT Short Term Goal 3 - Progress (Week 1): Met PT Short Term Goal 4 (Week 1): Pt will be able to propel w/c x 100' with S PT Short Term Goal 4 - Progress (Week 1): Met  Skilled Therapeutic Interventions/Progress Updates:  This session focused on bed mobility rolling to the left mod I using the bed rail.  Supine to sit mod assist to support trunk to get to sitting.  Bed to [facility] scoot pivot to the right mod assist to help control trunk balance and speed/precision of movement.  Attempted to get pt to manage his own leg rests, but had to provide min assist to keep him from falling anteriorly out of WC and after several attempts, provided hand over hand assist to get left leg rest on correctly.  Pt unable to manage his own breaks (posterior breaks) or armrests (push button anteriorly and posteriorly) due to type of chair he is using.  WC mobility 150' with supervision with 3-4 short 30 sec seated rest breaks due to upper extremity fatigue.  Sit to stand in parallel bars working on standing tolerance x 6 reps with max stand time 1 min 40 sec.  Max assist for sit to stand transition.  Pt with incontinent bowel episode into his briefs during our last stand and returned to room RN made aware. Transfer back to bed mod assist and sit to side lying left mod assist to help with bil legs.  Pt mod I scooting up in bed with bed rails and bed  in trendelenburg.     Therapy Documentation Precautions:  Precautions Precautions: Cervical;Fall Required Braces or Orthoses: Cervical Brace Cervical Brace: Soft collar Restrictions Weight Bearing Restrictions: No General:   Vital Signs: Therapy Vitals Temp: 97.7 F (36.5 C) Temp src: Oral Pulse Rate: 79 Resp: 18 BP: 164/89 mmHg Patient Position, if appropriate: Lying Oxygen Therapy SpO2: 97 % O2 Device: None (Room air) Pain: Pain Assessment Pain Assessment: 0-10 Pain Score:   5 Pain Type: Surgical pain Pain Location: Neck Pain Orientation: Posterior Pain Intervention(s): Repositioned;Ambulation/increased activity (pre medicated about 1300 per pt) Mobility:   Locomotion : Wheelchair Mobility Distance: 150  Trunk/Postural Assessment :    Balance:   Exercises:   Other Treatments:    See FIM for current functional status  Therapy/Group: Individual Therapy  Lurena Joiner B. Breaunna Gottlieb, PT, DPT [phone]   03/04/2012, 5:03 PM

## 2012-03-04 NOTE — Progress Notes (Signed)
Brief Nutrition Note  This RD spoke with patient's mother, Frank Brock. She states that she is very concerned that pt is getting too many suppositories.  She states that this is a new problem for him and wants him to just get more fiber in.  PA, Dan Anguilli helped discuss bowel regimen with mother. He stated that he would relay information to Frank Brock, [place].  RD asked mother what she would like this RD to do. She stated that she did not want RD to educate patient on healthier choices, rather she wanted this RD to make sure pt was choosing the correct meal options. Discussed need for increased fiber, including fruits, vegetables, and whole grains as well as water intake to help with bowel motility. Discussed option of mother helping patient select menu options and putting them on his door for nutrition ambassador, however she declined, stating "I want him to be able to make the right diet choices."   Mother was agreeable to this RD adding in a comment into [employer] (foodservice software) to encourage Nutrition Ambassador to encourage pt to order fresh fruits and vegetables.  Also told mother she could bring in bread from home that she typically buys for patient. Discussed with PA.  RD provided mother with contact info for this RD.  RD to continue to follow this pt.  Frank Motto MS, RD, LDN Pager: [phone] After-hours pager: [phone]

## 2012-03-04 NOTE — Progress Notes (Signed)
Physical Therapy Session Note  Patient Details  Name: Frank Brock MRN: [id number] Date of Birth: [birth year]  Today's Date: 03/04/2012 Time: 4098-1191 Time Calculation (min): 40 min  Short Term Goals: Week 1:  PT Short Term Goal 1 (Week 1): Pt will be able to demonstrate bed mobility with mod A PT Short Term Goal 2 (Week 1): Pt will be able to transfer with mod A PT Short Term Goal 3 (Week 1): Pt will be able to demonstrate dynamic sitting balance with mod A during functional task PT Short Term Goal 4 (Week 1): Pt will be able to propel w/c x 100' with S  Skilled Therapeutic Interventions/Progress Updates:    Focused on w/c mobility on unit for endurance and functional mobility (S with rest breaks as needed), transfers without sliding board (squat pivot technique), and LE strengthening seated edge of mat (including LAQ, resisted knee flexion, and seated marches x 10 reps each bilaterally). Need to focus on pt managing w/c parts in future session for transfers and overall safety with transfer (pt a little impulsive with transfer). Returned back to bed end of session with mod A to rest before afternoon sessions. Pt reports incontinent BM and nurse tech notified to assist.   Therapy Documentation Precautions:  Precautions Precautions: Cervical;Fall Required Braces or Orthoses: Cervical Brace Cervical Brace: Soft collar Restrictions Weight Bearing Restrictions: No   Pain:  Pain in rectum/bottom - premedicated.   See FIM for current functional status  Therapy/Group: Individual Therapy  [name] [facility] 03/04/2012, 12:12 PM

## 2012-03-04 NOTE — Progress Notes (Signed)
Physical Therapy Weekly Progress Note  Patient Details  Name: SERIGNE KUBICEK MRN: [id number] Date of Birth: 1960/08/13  Today's Date: 03/04/2012  Patient has met 3 of 4 short term goals.  Pt is making good progress functionally with therapy. Able to transfer with mod A without use of slide board on level surfaces and working on w/c parts management, sit to stands, and standing tolerance. Upgraded long term transfer goal to S.   Patient continues to demonstrate the following deficits: quadriparesis, decreased activity tolerance, pain, skin breakdown, decreased strength and therefore will continue to benefit from skilled PT intervention to enhance overall performance with activity tolerance, balance, postural control, ability to compensate for deficits, functional use of  right upper extremity, right lower extremity, left upper extremity and left lower extremity and coordination.  Patient progressing toward long term goals..  Continue plan of care.  PT Short Term Goals Week 1:  PT Short Term Goal 1 (Week 1): Pt will be able to demonstrate bed mobility with mod A PT Short Term Goal 1 - Progress (Week 1): Not met PT Short Term Goal 2 (Week 1): Pt will be able to transfer with mod A PT Short Term Goal 2 - Progress (Week 1): Met PT Short Term Goal 3 (Week 1): Pt will be able to demonstrate dynamic sitting balance with mod A during functional task PT Short Term Goal 3 - Progress (Week 1): Met PT Short Term Goal 4 (Week 1): Pt will be able to propel w/c x 100' with S PT Short Term Goal 4 - Progress (Week 1): Met Week 2:  PT Short Term Goal 1 (Week 2): Pt will be able to perform bed mobility with min A PT Short Term Goal 2 (Week 2): Pt will be able to transfer with min A PT Short Term Goal 3 (Week 2): Pt will be able to propel w/c mod I on unit PT Short Term Goal 4 (Week 2): Pt will demonstrate dynamic standing balance for pre-gait activities with max A  Skilled Therapeutic Interventions/Progress  Updates:  Ambulation/gait training;Balance/vestibular training;Community reintegration;Functional electrical stimulation;DME/adaptive equipment instruction;Disease management/prevention;Discharge planning;Functional mobility training;Neuromuscular re-education;Pain management;Patient/family education;Stair training;Psychosocial support;Skin care/wound management;Splinting/orthotics;UE/LE Strength taining/ROM;Therapeutic Exercise;Therapeutic Activities;Wheelchair propulsion/positioning;UE/LE Coordination activities   Therapy Documentation Precautions:  Precautions Precautions: Cervical;Fall Required Braces or Orthoses: Cervical Brace Cervical Brace: Soft collar Restrictions Weight Bearing Restrictions: No  See FIM for current functional status    [name] [facility] 03/04/2012, 4:44 PM

## 2012-03-04 NOTE — Progress Notes (Signed)
Patient ID: Frank Brock, male   DOB: Dec 06, 1960, 52 y.o.   MRN: [id number]  Resting on right side in bed wearing collar. Pt turns himself onto his back to speak with nurse; reporting very pleased with continued progress, "I'd like to be able to walk out of here, but I'm better than I was".  Now moving all extremities, working with therapy on transfers & fine motor. Wears collar prn.   Georgiann Cocker, RN, BSN

## 2012-03-04 NOTE — Progress Notes (Signed)
Occupational Therapy Note  Patient Details  Name: Frank Brock MRN: [id number] Date of Birth: 1960-05-30 Today's Date: 03/04/2012  Time: 1330-1400 Pt c/o neck discomfort (unrated); repositioned  Individual Therapy  Pt resting in bed upon arrival.  Pt stated that earlier OT session "wore my arms out" and PT session "wore my legs out."  Pt agreeable to sitting EOB and practicing stand pivot transfers.  Pt required min A for supine->sit EOB and able to maintain sitting balance with supervision.  Pt required min A for sit->stand and steady A for stand pivot transfer with verbal cues to move feet over to w/c.  Pt practiced stand pivot transfers X 4.  Pt exhibited increased difficulty with moving legs during last 2 transfers and pt stated he was getting tired.  Pt performed sit->supine with supervision and with HOB lowered was able to scoot up in bed without assistance. Lavone Neri [facility] 03/04/2012, 2:56 PM

## 2012-03-04 NOTE — Progress Notes (Signed)
PT HYDROTHERAPY   Received order for Hydrotherapy. Spoke with Nsg, unable to schedule treatment for today, scheduled for initial evaluation tomorrow 03/05/12 at 8:30 am. And subsequently 8:30 to 9:30 for remainder of week.  Charlotte Crumb, PT DPT  [phone]

## 2012-03-05 ENCOUNTER — Inpatient Hospital Stay (HOSPITAL_COMMUNITY): Payer: Medicare HMO

## 2012-03-05 ENCOUNTER — Inpatient Hospital Stay (HOSPITAL_COMMUNITY): Payer: Medicare HMO | Admitting: Occupational Therapy

## 2012-03-05 DIAGNOSIS — J9600 Acute respiratory failure, unspecified whether with hypoxia or hypercapnia: Secondary | ICD-10-CM

## 2012-03-05 DIAGNOSIS — L89109 Pressure ulcer of unspecified part of back, unspecified stage: Secondary | ICD-10-CM

## 2012-03-05 DIAGNOSIS — G8250 Quadriplegia, unspecified: Secondary | ICD-10-CM

## 2012-03-05 DIAGNOSIS — G061 Intraspinal abscess and granuloma: Secondary | ICD-10-CM

## 2012-03-05 LAB — CBC
Hemoglobin: 9.9 g/dL — ABNORMAL LOW (ref 13.0–17.0)
MCH: 28.8 pg (ref 26.0–34.0)
Platelets: 354 10*3/uL (ref 150–400)
RBC: 3.44 MIL/uL — ABNORMAL LOW (ref 4.22–5.81)
WBC: 8 10*3/uL (ref 4.0–10.5)

## 2012-03-05 MED ORDER — DIPHENHYDRAMINE HCL 50 MG PO CAPS
50.0000 mg | ORAL_CAPSULE | Freq: Every day | ORAL | Status: DC
  Administered 2012-03-05 – 2012-03-13 (×9): 50 mg via ORAL
  Filled 2012-03-05 (×10): qty 1

## 2012-03-05 NOTE — Progress Notes (Signed)
NUTRITION FOLLOW UP  Patient meets criteria for Severe malnutrition related to acute illness AEB >20 lb weight loss in the last 2 weeks (12%) and decreased muscle mass and body fat.   Intervention:   1. Resource Breeze po TID, each supplement provides 250 kcal and 9 grams of protein. 2. RD provided diet education regarding "Consitpation Nutrition Therapy" - reviewed information with patient and mother at bedside. 3. RD to continue to follow nutrition care plan  Nutrition Dx:   Inadequate oral intake related to poor appetite as evidenced by documentation; progressing  Goal:   Pt to meet >/= 90% of their estimated nutrition needs; progressing.   Monitor:   PO intake, weight, wounds  Assessment:   RD was asked by patient's mother to discuss oral intake of fiber-rich foods. This RD discussed oral intake with patient and mother at bedside.   Reviewed information with patient. He verbalized understanding and provided limited participation in education.  Pt states that he enjoys [employer] and has been drinking it as scheduled - reports that it his favorite supplement (compared to Boost and Ensure Complete.) Agreeable to continuing this supplement.   Height: Ht Readings from Last 1 Encounters:  02/10/12 5\' 10"  (1.778 m)    Weight Status:  Admission weight 159 lb Wt Readings from Last 1 Encounters:  03/04/12 132 lb 7.9 oz (60.1 kg)  trending down  Estimated needs:  Kcal: 2200-2300  Protein: 110-130 gm  Fluid: 2.2-2.3L  Skin: Chest wound with VAC, unstageable pressure ulcer on coccyx  Diet Order: Cardiac; Resource Breeze PO TID Meal Completion: 75-100%   Intake/Output Summary (Last 24 hours) at 03/05/12 0816 Last data filed at 03/05/12 0541  Gross per 24 hour  Intake   1720 ml  Output   2600 ml  Net   -880 ml    Last BM: 2/11   Labs:   Recent Labs Lab 02/29/12 1104  NA 130*  K 4.7  CL 97  CO2 22  BUN 16  CREATININE 0.63  CALCIUM 9.3  GLUCOSE 107*     CBG (last 3)  No results found for this basename: GLUCAP,  in the last 72 hours  Scheduled Meds: . alteplase  2 mg Intracatheter Once  . amLODipine  10 mg Oral Daily  . antiseptic oral rinse  1 application Mouth Rinse QID  . chlorhexidine  15 mL Mouth Rinse BID  . collagenase   Topical Daily  . diphenhydrAMINE  50 mg Oral QHS  . enoxaparin (LOVENOX) injection  60 mg Subcutaneous Q12H  . feeding supplement  1 Container Oral TID BM  . magic mouthwash  5 mL Oral QID  . protein supplement  1 scoop Oral TID WC  . saccharomyces boulardii  250 mg Oral BID  . vancomycin  1,250 mg Intravenous Q12H    Continuous Infusions:  none  Jarold Motto MS, RD, LDN Pager: [phone] After-hours pager: [phone]

## 2012-03-05 NOTE — Plan of Care (Signed)
Problem: RH PAIN MANAGEMENT Goal: RH STG PAIN MANAGED AT OR BELOW PT'S PAIN GOAL Less or equal to 3  Outcome: Not Progressing Patient reports pain continuously. No less than 3 score today noted.

## 2012-03-05 NOTE — Progress Notes (Signed)
Physical Therapy Session Note  Patient Details  Name: Frank Brock MRN: [id number] Date of Birth: 08/03/1960  Today's Date: 03/05/2012 Time: 1445-1530 Time Calculation (min): 45 min  Short Term Goals: Week 2:  PT Short Term Goal 1 (Week 2): Pt will be able to perform bed mobility with min A PT Short Term Goal 2 (Week 2): Pt will be able to transfer with min A PT Short Term Goal 3 (Week 2): Pt will be able to propel w/c mod I on unit PT Short Term Goal 4 (Week 2): Pt will demonstrate dynamic standing balance for pre-gait activities with max A  Skilled Therapeutic Interventions/Progress Updates:    Pt reports that his arms are too tired from earlier OT session to attempt standing and pt prefers to work on LE therex only. Propelled w/c to gym with multiple rest breaks for endurance and strengthening and mod A transfer to/from Nustep - LE's only x 10 min on level 3 with rest breaks as needed and complaints of sacrum pain being too much. Pt declined any further therapy and requested to return back to bed to rest due to pain and fatigue.   Therapy Documentation Precautions:  Precautions Precautions: Cervical;Fall Required Braces or Orthoses: Cervical Brace Cervical Brace: Soft collar Restrictions Weight Bearing Restrictions: No General: Amount of Missed PT Time (min): 15 Minutes Missed Time Reason: Pain;Patient fatigue  Pain: Pain Assessment Pain Assessment: 0-10 Pain Score:   8 Pain Type: Acute pain Pain Location: Sacrum Pain Orientation: Mid Pain Descriptors: Sore Pain Onset: Gradual Pain Intervention(s): Medication (See eMAR);Repositioned (reposition self)  See FIM for current functional status  Therapy/Group: Individual Therapy  [name] [facility] 03/05/2012, 3:40 PM

## 2012-03-05 NOTE — Progress Notes (Signed)
PT HYDROTHERAPY EVALUATION   03/05/12 1300  Subjective Assessment  Subjective Pt agreeable to therapy  Patient and Family Stated Goals heal wound  Evaluation and Treatment  Evaluation and Treatment Procedures Explained to Patient/Family Yes  Evaluation and Treatment Procedures agreed to  Pressure Ulcer  Date First Assessed/Time First Assessed: 02/13/12 2100   Location: Coccyx  Location Orientation: Left  Staging: Unstageable - Full thickness tissue loss in which the base of the ulcer is covered by slough (yellow, tan, gray, green or brown) and/or eschar  State of Healing Non-healing  Site / Wound Assessment Pink;Yellow  % Wound base Yellow 100%  Wound Length (cm) 3 cm  Wound Width (cm) 2 cm  Wound Depth (cm) 0.2 cm  Drainage Amount None  Treatment Hydrotherapy (Pulse lavage)  Dressing Type Foam  Dressing Changed;Clean  Hydrotherapy  Pulsed Lavage with Suction (psi) 8 psi  Pulsed Lavage with Suction - Normal Saline Used 1000 mL  Pulsed Lavage Tip Tip with splash shield  Pulsed lavage therapy - wound location sacrum  Wound Therapy - Assess/Plan/Recommendations  Wound Therapy - Clinical Statement Wound is small and superficial, upon assessment able to debride top layer of slough. Scratch surface for adherence of chemical debridement ointment (santyl). Do no feel wound warrants continued hydrotherapy. Will sign off.  Factors Delaying/Impairing Wound Healing Altered sensation;Incontinence;Immobility;Vascular compromise    Charlotte Crumb, PT DPT  [phone]

## 2012-03-05 NOTE — Progress Notes (Signed)
Subjective/Complaints: Pain better. Sore from therapies yesterday A 12 point review of systems has been performed and if not noted above is otherwise negative.   Objective: Vital Signs: Blood pressure 125/71, pulse 69, temperature 97.8 F (36.6 C), temperature source Oral, resp. rate 18, weight 60.1 kg (132 lb 7.9 oz), SpO2 99.00%. No results found.  Recent Labs  03/03/12 0600 03/05/12 0505  WBC 8.2 8.0  HGB 10.3* 9.9*  HCT 31.6* 30.6*  PLT 402* 354   No results found for this basename: NA, K, CL, CO, GLUCOSE, BUN, CREATININE, CALCIUM,  in the last 72 hours CBG (last 3)  No results found for this basename: GLUCAP,  in the last 72 hours  Wt Readings from Last 3 Encounters:  03/04/12 60.1 kg (132 lb 7.9 oz)  02/25/12 76.2 kg (167 lb 15.9 oz)  02/25/12 76.2 kg (167 lb 15.9 oz)    Physical Exam:  Constitutional: He is oriented to person, place, and time. He appears well-developed. He has a sickly appearance.  Cardiovascular: Normal rate and regular rhythm.  Pulmonary/Chest: Effort normal. He has rales in the left middle field.  Abdominal: Soft. Bowel sounds are normal.  Musculoskeletal: He exhibits no tenderness.  Minimal edema left forearm. Bilateral heels boggy--non tender.  Neurological: He is alert and oriented to person, place, and time. A bit distracted but generally appropriate  decreased sensation rectally. Decreased sensation to LT in both hands and feet.  DTR's 1+, no resting tone. Good voice/phonation, cognition is generally intact.  MOTOR TESTING:  Muscle Right Left  Deltoid 3+ 3 + Triceps 3+  3 + Biceps 3+  3+ Wrist ext 3+ 3+ HI 3 3  HF 3 3  KE 3 3  ADF 3+ 3+  APF 3+ 3+  Skin:  Posterior neck incision clean and dry with steri strips in place. Lower chest wall with sutures anteriorly. Upper left chest wall with VAC in place and sealed. Sacrum with small area fibronecrotic tissue near sacrum which really hasn't changed over the last few  days.   Assessment/Plan: 1. 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 Pt with central cord clinical picture. He is s/p surgical decompression which require 3+ hours per day of interdisciplinary therapy in a comprehensive inpatient rehab setting. Physiatrist is providing close team supervision and 24 hour management of active medical problems listed below. Physiatrist and rehab team continue to assess barriers to discharge/monitor patient progress toward functional and medical goals. FIM: FIM - Bathing Bathing Steps Patient Completed: Chest;Right Arm;Left Arm;Abdomen Bathing: 0: Activity did not occur  FIM - Upper Body Dressing/Undressing Upper body dressing/undressing steps patient completed: Thread/unthread right sleeve of pullover shirt/dresss;Thread/unthread left sleeve of pullover shirt/dress Upper body dressing/undressing: 0: Activity did not occur FIM - Lower Body Dressing/Undressing Lower body dressing/undressing steps patient completed: Don/Doff left shoe;Fasten/unfasten right shoe Lower body dressing/undressing: 1: Total-Patient completed less than 25% of tasks  FIM - Toileting Toileting: 0: Activity did not occur  FIM - Diplomatic Services operational officer Devices: Bedside commode (drop arm) Toilet Transfers: 0-Activity did not occur  FIM - Banker Devices: Arm rests;Bed rails Bed/Chair Transfer: 3: Supine > Sit: Mod A (lifting assist/Pt. 50-74%/lift 2 legs;3: Sit > Supine: Mod A (lifting assist/Pt. 50-74%/lift 2 legs);3: Bed > Chair or W/C: Mod A (lift or lower assist);3: Chair or W/C > Bed: Mod A (lift or lower assist)  FIM - Locomotion: Wheelchair Distance: 150 Locomotion: Wheelchair:  5: Travels 150 ft or more: maneuvers on rugs and over door sills with supervision, cueing or coaxing FIM - Locomotion: Ambulation Locomotion: Ambulation: 0:  Activity did not occur  Comprehension Comprehension Mode: Auditory Comprehension: 6-Follows complex conversation/direction: With extra time/assistive device  Expression Expression Mode: Verbal Expression: 6-Expresses complex ideas: With extra time/assistive device  Social Interaction Social Interaction: 6-Interacts appropriately with others with medication or extra time (anti-anxiety, antidepressant).  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  2. Pain Management: Cervical collar prn for comfort. He claims a lot of his pain is "rectal" 3. Mood: Seems to have a good outlook. Will monitor along. LCSW to follow for formal evaluation.  4. Neuropsych: This patient is capable of making decisions on his/her own behalf.  5. Disseminated MRSA: S/P evacuation of abscess and minithorocotomy Vancomycin 6 weeks post-op from (1/23)  6. Anemia due to infection/crutical illness: Added iron supplement.  7. Hyponatremia: stable- recheck tomorrow 8. Abnormal LFTs: Likely due to sepsis. No GI symptoms. Stable to improved today 9. Neurogenic bowel/bladder- stools more formed  -dc'ed suppository, working on timed bowel movements also  -probiotic  added  -discussed with patient that l want this out and that he needs to begin a voiding trial 10. Wound care: continue vac to chest wall--hopefully dc this week  -continue santyl to sacral wound daily  -pt is directing pressure relief, turning  -air overlay for mattress--dc as he's able to turn on own.  -hydrotherapy to begin working on sacral wound today (didn't start yesterday)  LOS (Days) 9 A FACE TO FACE EVALUATION WAS PERFORMED  Lilyth Lawyer T 03/05/2012 8:24 AM

## 2012-03-05 NOTE — Progress Notes (Signed)
Vancomycin and lovenox per Rx  AC: new subclavian DVT 1/23. Started Hep drip but d/c'd for ?bleed but MRI neg bleed. S/p emergent spinal decompression surgery 1/23 for acute quadraplegia. Now on full lovenox. Later plan Xarelto or warf. CBC stable on 02/12, scr 0.63 on 2/7. lovenox dose adjusted based on correct Wt on 02/11 (had a weight of ~70 kg before, but actual weight is 60 kg. Had RN reweigh patient and it was the same as 60 kg.)  Goal 4hr LMWH level 0.6-1.2  ID: Vanc x 6 wks (through 3/6) for MRSA bacteremia and diffuse spinal infxn on MRI, s/p I&D of chest wall abscess drainage, thoracotomy, and VAC placement with chest tube 1/19, more debridement 1/20. Afebrile, WBC 8 on 02/12  MRSA PCR 1/17 >> POS Resp 1/17 >> MRSA  Blood 1/17 >> MRSA  1/18 bcx x2>> negative 1/19 L chest abscess>> abundant MRSA  1/23 L neck abscess>> few MRSA  1/21 VT = 13.5 (on 1gm q8h) 1/24 VT = 17.4 on 1250 q8h 1/27 VT = 17.7 on 1250mg  q8h 2/3 VT = 24.2 on 1250 mg q8h ->chg to q12h 2/9 VT = 14.8 (drawn 1.5 hr late) >> continue same dose  Tamiflu 1/17 >> 1/19 Zosyn 1/17 >> 1/20 Vanc 1/17 >> (3/6) Gent 1/24 >> 1/27 (trough was therapeutic at 0.6)  Vancomycin trough goal 15-20   Renal: UOP at 1.8 ml/kg/hr  HemOnc: H/H 9.9/30.6 and Plt 354K on 02/12   Plan: 1. Cont lovenox to 60mg  sq q12h; monitor weigh 2. Continue vanc 1250mg  iv q12h. Repeat VT on 02/16 3. F/u oral AC

## 2012-03-05 NOTE — Progress Notes (Signed)
Occupational Therapy Session Notes  Patient Details  Name: Frank Brock MRN: [id number] Date of Birth: 1960/02/23  Today's Date: 03/05/2012  Short Term Goals: Week 1:  OT Short Term Goal 1 (Week 1): Patient will complete UB dressing sitting edge of bed with moderate assistance OT Short Term Goal 1 - Progress (Week 1): Met OT Short Term Goal 2 (Week 1): Patient will complete LB dressing in supine position with maximal assistance OT Short Term Goal 2 - Progress (Week 1): Met OT Short Term Goal 3 (Week 1): Patient will perform grooming tasks seated at sink with miminal assistance OT Short Term Goal 3 - Progress (Week 1): Met OT Short Term Goal 4 (Week 1): BSC will be introduced to patient for bowel program with nursing OT Short Term Goal 4 - Progress (Week 1): Met  Week 2:  OT Short Term Goal 1 (Week 2): Patient will donn shirt seated edge of bed with minimal assistance OT Short Term Goal 2 (Week 2): Patient will complete LB dressing with moderate assistance at bed level OT Short Term Goal 3 (Week 2): Patient will be introduced to a HEP for BUEs OT Short Term Goal 4 (Week 2): Patient will perform toilet transfers with moderate assistance  Skilled Therapeutic Interventions/Progress Updates:   Session #1 [id number] - 55 Minutes Individual Therapy No complaints of pain Patient found supine in bed. Engaged in bed mobility for patient to complete LB bathing & dressing in supine position. Patient then sat edge of bed to donn bilateral shoes and transfer edge of bed -> w/c with min assist from therapist performing squat pivot transfer. Patient then sat at sink for grooming tasks and UB bathing & dressing. At end of session left patient seated in w/c with pillow behind head for positioning and with call bell & phone within reach.   Session #2 [id number] - 40 Minutes Individual Therapy Patient with 7/10 complaints of pain around sacrum, notified RN Patient found supine in bed. Patient engaged  in bed mobility and transferred OOB->w/c with min assist. Patient then propelled self from room -> therapy gym for transfer onto therapy mat. Patient then engaged in therapeutic exercise -> bilateral UEs. Patient in pain this afternoon and fatigued. After exercises patient transferred back into w/c and therapist propelled patient back to room to transfer back into bed. Left patient supine in bed with call bell & phone within reach.   Precautions:  Precautions Precautions: Cervical;Fall Required Braces or Orthoses: Cervical Brace Cervical Brace: Soft collar Restrictions Weight Bearing Restrictions: No  See FIM for current functional status  Kensi Karr 03/05/2012, 12:07 PM

## 2012-03-06 ENCOUNTER — Inpatient Hospital Stay (HOSPITAL_COMMUNITY): Payer: Medicare HMO

## 2012-03-06 ENCOUNTER — Inpatient Hospital Stay (HOSPITAL_COMMUNITY): Payer: Medicare HMO | Admitting: Occupational Therapy

## 2012-03-06 LAB — CBC
HCT: 31.2 % — ABNORMAL LOW (ref 39.0–52.0)
Hemoglobin: 10.1 g/dL — ABNORMAL LOW (ref 13.0–17.0)
MCV: 88.6 fL (ref 78.0–100.0)
RDW: 13.9 % (ref 11.5–15.5)
WBC: 9.7 10*3/uL (ref 4.0–10.5)

## 2012-03-06 MED ORDER — BETHANECHOL CHLORIDE 25 MG PO TABS
25.0000 mg | ORAL_TABLET | Freq: Three times a day (TID) | ORAL | Status: DC
  Administered 2012-03-06 – 2012-03-07 (×4): 25 mg via ORAL
  Filled 2012-03-06 (×7): qty 1

## 2012-03-06 NOTE — Progress Notes (Signed)
Occupational Therapy Session Notes  Patient Details  Name: Frank Brock MRN: [id number] Date of Birth: [birth year]  Today's Date: 03/06/2012  Short Term Goals: Week 1:  OT Short Term Goal 1 (Week 1): Patient will complete UB dressing sitting edge of bed with moderate assistance OT Short Term Goal 1 - Progress (Week 1): Met OT Short Term Goal 2 (Week 1): Patient will complete LB dressing in supine position with maximal assistance OT Short Term Goal 2 - Progress (Week 1): Met OT Short Term Goal 3 (Week 1): Patient will perform grooming tasks seated at sink with miminal assistance OT Short Term Goal 3 - Progress (Week 1): Met OT Short Term Goal 4 (Week 1): BSC will be introduced to patient for bowel program with nursing OT Short Term Goal 4 - Progress (Week 1): Met  Week 2:  OT Short Term Goal 1 (Week 2): Patient will donn shirt seated edge of bed with minimal assistance OT Short Term Goal 2 (Week 2): Patient will complete LB dressing with moderate assistance at bed level OT Short Term Goal 3 (Week 2): Patient will be introduced to a HEP for BUEs OT Short Term Goal 4 (Week 2): Patient will perform toilet transfers with moderate assistance  Skilled Therapeutic Interventions/Progress Updates:   Session #1 1005-1100 - 55 Minutes Individual Therapy Patient with 5/10 complaints of pain in neck and around sacrum area, RN aware and administered pain meds Patient found supine in bed. Engaged in bed positioning for patient to take medication, patient then engaged in LB bathing & dressing at bed level. After LB ADL patient sat edge of bed for edge of bed -> w/c squat pivot transfer. Once in w/c patient performed grooming tasks seated at sink. Focused skilled intervention on bed mobility, bed positioning, LB bathing & dressing, functional use of bilateral UEs, grooming tasks seated in w/c at sink, and overall activity tolerance/endurance. Educated patient on some theraputty exercises and encouraged  him to use theraputty to increase strength in bilateral hands. Left patient seated in w/c beside bed with call bell & phone within reach.   Session #2 [id number] - 55 Minutes Individual Therapy Patient with 5/10 complaints of pain in neck, RN made aware Patient found supine in bed. Transferred edge of bed -> w/c with supervision. Patient then propelled self from room -> ADL apartment for tub/shower transfer on/off tub transfer bench. Worked on w/c management during transfers as well. Patient then propelled self -> therapy gym for UE exercise using SCIFIT machine and transferred onto therapy mat for core strengthening exercises. Therapist propelled patient back to room and left patient supine in bed with call bell & phone within reach.   Precautions:  Precautions Precautions: Cervical;Fall Required Braces or Orthoses: Cervical Brace Cervical Brace: Soft collar Restrictions Weight Bearing Restrictions: No  See FIM for current functional status  Alexus Galka 03/06/2012, 7:54 AM

## 2012-03-06 NOTE — Progress Notes (Signed)
Physical Therapy Session Note  Patient Details  Name: Frank Brock MRN: [id number] Date of Birth: 1960-08-09  Today's Date: 03/06/2012 Time: 1135-1200 Time Calculation (min): 25 min  Short Term Goals: Week 2:  PT Short Term Goal 1 (Week 2): Pt will be able to perform bed mobility with min A PT Short Term Goal 2 (Week 2): Pt will be able to transfer with min A PT Short Term Goal 3 (Week 2): Pt will be able to propel w/c mod I on unit PT Short Term Goal 4 (Week 2): Pt will demonstrate dynamic standing balance for pre-gait activities with max A  Skilled Therapeutic Interventions/Progress Updates:    Due to pt requesting to return to bed due to having to "sit up for an hour and a half already", encouraged pt to set up w/c and manage parts to prepare for transfer back to bed to work on stretching. Min A with squat pivot and cues for safety and to return to supine. Supine stretching to LE's including knee to chest and piriformis stretch x 2 reps each bilaterally and 30 second hold. Repositioned in bed and set up for lunch with all needs in place.  Therapy Documentation Precautions:  Precautions Precautions: Cervical;Fall Required Braces or Orthoses: Cervical Brace Cervical Brace: Soft collar Restrictions Weight Bearing Restrictions: No   Pain: Complains of pain in sacrum - premedicated.   See FIM for current functional status  Therapy/Group: Individual Therapy  [name] [facility] 03/06/2012, 12:10 PM

## 2012-03-06 NOTE — Progress Notes (Signed)
Subjective/Complaints: Pain better. Sore from therapies yesterday A 12 point review of systems has been performed and if not noted above is otherwise negative.   Objective: Vital Signs: Blood pressure 120/74, pulse 70, temperature 98.1 F (36.7 C), temperature source Oral, resp. rate 18, weight 60.7 kg (133 lb 13.1 oz), SpO2 97.00%. No results found.  Recent Labs  03/05/12 0505 03/06/12 0620  WBC 8.0 9.7  HGB 9.9* 10.1*  HCT 30.6* 31.2*  PLT 354 330   No results found for this basename: NA, K, CL, CO, GLUCOSE, BUN, CREATININE, CALCIUM,  in the last 72 hours CBG (last 3)  No results found for this basename: GLUCAP,  in the last 72 hours  Wt Readings from Last 3 Encounters:  03/06/12 60.7 kg (133 lb 13.1 oz)  02/25/12 76.2 kg (167 lb 15.9 oz)  02/25/12 76.2 kg (167 lb 15.9 oz)    Physical Exam:  Constitutional: He is oriented to person, place, and time. He appears well-developed. He has a sickly appearance.  Cardiovascular: Normal rate and regular rhythm.  Pulmonary/Chest: Effort normal. He has rales in the left middle field.  Abdominal: Soft. Bowel sounds are normal.  Musculoskeletal: He exhibits no tenderness.  Minimal edema left forearm. Bilateral heels boggy--non tender.  Neurological: He is alert and oriented to person, place, and time. A bit distracted but generally appropriate  decreased sensation rectally. Decreased sensation to LT in both hands and feet.  DTR's 1+, no resting tone. Good voice/phonation, cognition is generally intact.  MOTOR TESTING:  Muscle Right Left  Deltoid 3+ 3 + Triceps 3+  3 + Biceps 3+  3+ Wrist ext 3+ 3+ HI 3 3  HF 3 3  KE 3 3  ADF 3+ 3+  APF 3+ 3+  Skin:  Posterior neck incision clean and dry with steri strips in place. Lower chest wall with sutures anteriorly. Upper left chest wall with VAC in place and sealed. Sacrum with small area fibronecrotic tissue near sacrum which really hasn't changed over the last few  days.   Assessment/Plan: 1. 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 Pt with central cord clinical picture. He is s/p surgical decompression which require 3+ hours per day of interdisciplinary therapy in a comprehensive inpatient rehab setting. Physiatrist is providing close team supervision and 24 hour management of active medical problems listed below. Physiatrist and rehab team continue to assess barriers to discharge/monitor patient progress toward functional and medical goals. FIM: FIM - Bathing Bathing Steps Patient Completed: Right upper leg;Left upper leg Bathing: 1: Total-Patient completes 0-2 of 10 parts or less than 25%  FIM - Upper Body Dressing/Undressing Upper body dressing/undressing steps patient completed: Thread/unthread right sleeve of pullover shirt/dresss;Thread/unthread left sleeve of pullover shirt/dress Upper body dressing/undressing: 3: Mod-Patient completed 50-74% of tasks FIM - Lower Body Dressing/Undressing Lower body dressing/undressing steps patient completed: Thread/unthread right pants leg;Thread/unthread left pants leg;Pull pants up/down Lower body dressing/undressing: 3: Mod-Patient completed 50-74% of tasks  FIM - Toileting Toileting: 0: Activity did not occur  FIM - Diplomatic Services operational officer Devices: Bedside commode (drop arm) Toilet Transfers: 0-Activity did not occur  FIM - Banker Devices: Arm rests;Bed rails Bed/Chair Transfer: 3: Chair or W/C > Bed: Mod A (lift or lower assist);3: Sit > Supine: Mod A (lifting assist/Pt. 50-74%/lift 2 legs)  FIM - Locomotion: Wheelchair Distance: 150 Locomotion: Wheelchair: 5: Travels 150 ft or more: maneuvers on rugs and  over door sills with supervision, cueing or coaxing FIM - Locomotion: Ambulation Locomotion: Ambulation: 0: Activity did not  occur  Comprehension Comprehension Mode: Auditory Comprehension: 6-Follows complex conversation/direction: With extra time/assistive device  Expression Expression Mode: Verbal Expression: 7-Expresses complex ideas: With no assist  Social Interaction Social Interaction: 6-Interacts appropriately with others with medication or extra time (anti-anxiety, antidepressant).  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  2. Pain Management: Cervical collar prn for comfort. He claims a lot of his pain is "rectal" 3. Mood: Seems to have a good outlook. Will monitor along. LCSW to follow for formal evaluation.  4. Neuropsych: This patient is capable of making decisions on his/her own behalf.  5. Disseminated MRSA: S/P evacuation of abscess and minithorocotomy Vancomycin 6 weeks post-op from (1/23)  6. Anemia due to infection/crutical illness: Added iron supplement.  7. Hyponatremia: stable- recheck today 8. Abnormal LFTs: Likely due to sepsis. No GI symptoms. Stable to improved today 9. Neurogenic bowel/bladder- stools more formed  -dc'ed suppository, working on timed bowel movements also  -probiotic  added  -I/O cath prn. Will add urecholine to help voiding. 10. Wound care: continue vac to chest wall--hopefully dc this week  -continue santyl to sacral wound daily  -pt is directing pressure relief, turning  -hydrotherapy provided x 1. They feel that we can manage with scratch debridement and santyl only.  LOS (Days) 10 A FACE TO FACE EVALUATION WAS PERFORMED  SWARTZ,ZACHARY T 03/06/2012 9:17 AM

## 2012-03-06 NOTE — Progress Notes (Signed)
Social Work Patient ID: Frank Brock, male   DOB: 1960/11/04, 52 y.o.   MRN: [id number]  Received verbal approval of insurance coverage from [employer] through 2/20 at this point.  Will send update after next weeks team conference.  Taeja Debellis, LCSW

## 2012-03-06 NOTE — Progress Notes (Signed)
Social Work Patient ID: Frank Brock, male   DOB: 1961-01-06, 52 y.o.   MRN: [id number]  Met yesterday with patient and his mother to review team conference.  Both aware that team continues to aim toward 2/28 discharge with minimal to modified independent goals.  Stressed to both that family will need to be able to provide this.  Pt insists family is capable and mother confirms.  Pt pleased with progress made to date and optimistic he may do better than anticipated goals.  Pleased with healing of wound as well.  Will continue to follow for d/c planning needs.  Kelise Kuch, LCSW

## 2012-03-06 NOTE — Progress Notes (Signed)
Orthopedic Tech Progress Note Patient Details:  Frank Brock Jul 19, 1960 [id number]  Ortho Devices Type of Ortho Device: Soft collar Ortho Device/Splint Location: REPLACEMENT SOFT COLLAR Ortho Device/Splint Interventions: Application   Cammer, Mickie Bail 03/06/2012, 3:56 PM

## 2012-03-06 NOTE — Progress Notes (Signed)
Physical Therapy Session Note  Patient Details  Name: DAQUAWN SEELMAN MRN: [id number] Date of Birth: 1960/10/16  Today's Date: 03/06/2012 Time: 1500-1540 Time Calculation (min): 40 min  Short Term Goals: Week 2:  PT Short Term Goal 1 (Week 2): Pt will be able to perform bed mobility with min A PT Short Term Goal 2 (Week 2): Pt will be able to transfer with min A PT Short Term Goal 3 (Week 2): Pt will be able to propel w/c mod I on unit PT Short Term Goal 4 (Week 2): Pt will demonstrate dynamic standing balance for pre-gait activities with max A  Skilled Therapeutic Interventions/Progress Updates:    Treatment focused on squat and stand pivot transfers (mod A) , pre-gait activities in the parallel bars with stepping forward and backward (ataxic) and progressed to gait x 4' forward and backwards. Good progress today with standing and transfers.    Therapy Documentation Precautions:  Precautions Precautions: Cervical;Fall Required Braces or Orthoses: Cervical Brace Cervical Brace: Soft collar Restrictions Weight Bearing Restrictions: No  Pain: Reports pain in sacrum - notified RN for pain medication end of session.   Locomotion : Ambulation Ambulation/Gait Assistance: 3: Mod assist   See FIM for current functional status  Therapy/Group: Individual Therapy  [name] [facility] 03/06/2012, 3:45 PM

## 2012-03-07 ENCOUNTER — Inpatient Hospital Stay (HOSPITAL_COMMUNITY): Payer: Medicare HMO

## 2012-03-07 ENCOUNTER — Inpatient Hospital Stay (HOSPITAL_COMMUNITY): Payer: Medicare HMO | Admitting: Occupational Therapy

## 2012-03-07 ENCOUNTER — Inpatient Hospital Stay (HOSPITAL_COMMUNITY): Payer: Medicare HMO | Admitting: *Deleted

## 2012-03-07 MED ORDER — TAMSULOSIN HCL 0.4 MG PO CAPS
0.4000 mg | ORAL_CAPSULE | Freq: Every day | ORAL | Status: DC
  Administered 2012-03-07 – 2012-03-12 (×6): 0.4 mg via ORAL
  Filled 2012-03-07 (×7): qty 1

## 2012-03-07 MED ORDER — FLAVOXATE HCL 100 MG PO TABS
100.0000 mg | ORAL_TABLET | Freq: Three times a day (TID) | ORAL | Status: DC | PRN
  Administered 2012-03-07 – 2012-03-10 (×6): 100 mg via ORAL
  Filled 2012-03-07 (×8): qty 1

## 2012-03-07 NOTE — Progress Notes (Signed)
Occupational Therapy Note  Patient Details  Name: Frank Brock MRN: [id number] Date of Birth: 09-18-1960 Today's Date: 03/07/2012 Pain:  6/10 in bladder area supine;  10/10 when sitting up.   Time:  1545-1610  (25 min)  Individual session Pt. Missed 10 minutes of OT due to discomfort.     Pt states since the multiple attempts at cathing last night, his pain is unbearable in sitting position in his bladder area.  .  Unable to sit more than 45 degrees. Pt. lying in bed and turning from one side to another.  Encouraged pt to participate in UE exercises in supine, but he said  His right arm was sore from the exercises on Thursday.  Pt. Refused to engage in any other UE exercises or activities.     Humberto Seals 03/07/2012, 4:02 PM

## 2012-03-07 NOTE — Progress Notes (Signed)
Subjective/Complaints: Not voiding. Trouble passing cath. Eventually coude, indwelling catheter placed. Otherwise patient is doing well. A 12 point review of systems has been performed and if not noted above is otherwise negative.   Objective: Vital Signs: Blood pressure 133/75, pulse 84, temperature 98.2 F (36.8 C), temperature source Oral, resp. rate 18, weight 61.4 kg (135 lb 5.8 oz), SpO2 96.00%. No results found.  Recent Labs  03/05/12 0505 03/06/12 0620  WBC 8.0 9.7  HGB 9.9* 10.1*  HCT 30.6* 31.2*  PLT 354 330   No results found for this basename: NA, K, CL, CO, GLUCOSE, BUN, CREATININE, CALCIUM,  in the last 72 hours CBG (last 3)  No results found for this basename: GLUCAP,  in the last 72 hours  Wt Readings from Last 3 Encounters:  03/07/12 61.4 kg (135 lb 5.8 oz)  02/25/12 76.2 kg (167 lb 15.9 oz)  02/25/12 76.2 kg (167 lb 15.9 oz)    Physical Exam:  Constitutional: He is oriented to person, place, and time. He appears well-developed. He has a sickly appearance.  Cardiovascular: Normal rate and regular rhythm.  Pulmonary/Chest: Effort normal. He has rales in the left middle field.  Abdominal: Soft. Bowel sounds are normal.  Musculoskeletal: He exhibits no tenderness.  Minimal edema left forearm. Bilateral heels boggy--non tender.  Neurological: He is alert and oriented to person, place, and time. A bit distracted but generally appropriate  decreased sensation rectally. Decreased sensation to LT in both hands and feet.  DTR's 1+, no resting tone. Good voice/phonation, cognition is generally intact.  MOTOR TESTING:  Muscle Right Left  Deltoid 3+ 3 + Triceps 3+  3 + Biceps 3+  3+ Wrist ext 3+ 3+ HI 3 3  HF 3 3  KE 3 3  ADF 3+ 3+  APF 3+ 3+  Skin:  Posterior neck incision clean and dry with steri strips in place. Lower chest wall with sutures anteriorly. Upper left chest wall with VAC in place and sealed. Sacrum with small area fibronecrotic tissue near  sacrum which really hasn't changed over the last few days.   Assessment/Plan: 1. 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 Pt with central cord clinical picture. He is s/p surgical decompression which require 3+ hours per day of interdisciplinary therapy in a comprehensive inpatient rehab setting. Physiatrist is providing close team supervision and 24 hour management of active medical problems listed below. Physiatrist and rehab team continue to assess barriers to discharge/monitor patient progress toward functional and medical goals. FIM: FIM - Bathing Bathing Steps Patient Completed: Right upper leg;Left upper leg;Right lower leg (including foot);Left lower leg (including foot) Bathing: 5: Supervision: Safety issues/verbal cues (supervision for areas bathed)  FIM - Upper Body Dressing/Undressing Upper body dressing/undressing steps patient completed: Thread/unthread right sleeve of pullover shirt/dresss;Thread/unthread left sleeve of pullover shirt/dress;Pull shirt over trunk Upper body dressing/undressing: 4: Min-Patient completed 75 plus % of tasks FIM - Lower Body Dressing/Undressing Lower body dressing/undressing steps patient completed: Thread/unthread right pants leg;Thread/unthread left pants leg;Pull pants up/down;Don/Doff right sock;Don/Doff right shoe;Don/Doff left shoe Lower body dressing/undressing: 4: Min-Patient completed 75 plus % of tasks  FIM - Toileting Toileting: 0: Activity did not occur  FIM - Diplomatic Services operational officer Devices: Bedside commode (drop arm) Toilet Transfers: 0-Activity did not occur  FIM - Banker Devices: Arm rests Bed/Chair Transfer: 4: Supine > Sit: Min A (steadying Pt. > 75%/lift 1 leg);4: Bed >  Chair or W/C: Min A (steadying Pt. > 75%);3: Chair or W/C > Bed: Mod A (lift or lower assist);5: Sit > Supine:  Supervision (verbal cues/safety issues)  FIM - Locomotion: Wheelchair Distance: 150 Locomotion: Wheelchair: 5: Travels 150 ft or more: maneuvers on rugs and over door sills with supervision, cueing or coaxing FIM - Locomotion: Ambulation Locomotion: Ambulation Assistive Devices: Parallel bars Ambulation/Gait Assistance: 3: Mod assist Locomotion: Ambulation: 1: Travels less than 50 ft with moderate assistance (Pt: 50 - 74%)  Comprehension Comprehension Mode: Auditory Comprehension: 6-Follows complex conversation/direction: With extra time/assistive device  Expression Expression Mode: Verbal Expression: 7-Expresses complex ideas: With no assist  Social Interaction Social Interaction: 6-Interacts appropriately with others with medication or extra time (anti-anxiety, antidepressant).  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  2. Pain Management: Cervical collar prn for comfort.   3. Mood: Seems to have a good outlook. No active issues at present.  4. Neuropsych: This patient is capable of making decisions on his/her own behalf.  5. Disseminated MRSA: S/P evacuation of abscess and minithorocotomy Vancomycin 6 weeks post-op from (1/23)  6. Anemia due to infection/crutical illness: Added iron supplement.  7. Hyponatremia: stable/improved--recheck monday 8. Abnormal LFTs: Likely due to sepsis. No GI symptoms. Stable to improved today 9. Neurogenic bowel/bladder- stools more formed  -dc'ed suppository, working on timed bowel movements also  -probiotic  added  -may have prostate hypertrophy  As well.  -foley replaced. Will start flomax  -consider another voiding trial next week 10. Wound care: wet to dry to chest wound-looks great  -continue santyl to sacral wound daily  -pt is directing pressure relief,  turning  -hydrotherapy provided x 1.   LOS (Days) 11 A FACE TO FACE EVALUATION WAS PERFORMED  SWARTZ,ZACHARY T 03/07/2012 9:24 AM

## 2012-03-07 NOTE — Progress Notes (Signed)
Around 2030 pt was resting in bed and complained of pressure in his bladder. A bladder scan was done showing 200 cc of urine. An in and out cath was performed however there was no urine return. Another in and out cath was attempted by another RN and there was no urine return. Pt  stated he wanted to wait to try a cath again. At 2340 pt stated he felt bladder pressure a bladder scan was done showing greater than 999 cc of urine. MD on call made aware. New order received to insert coude cath. Pt is now draining clear yellow urine and denies any bladder pressure at this time. Will cont to monitor pt.

## 2012-03-07 NOTE — Progress Notes (Signed)
Physical Therapy Session Note  Patient Details  Name: Frank Brock MRN: [id number] Date of Birth: Nov 09, 1960  Today's Date: 03/07/2012 Time: 0900-0930 Time Calculation (min): 30 min  Short Term Goals: Week 2:  PT Short Term Goal 1 (Week 2): Pt will be able to perform bed mobility with min A PT Short Term Goal 2 (Week 2): Pt will be able to transfer with min A PT Short Term Goal 3 (Week 2): Pt will be able to propel w/c mod I on unit PT Short Term Goal 4 (Week 2): Pt will demonstrate dynamic standing balance for pre-gait activities with max A  Skilled Therapeutic Interventions/Progress Updates:    Focused on functional strengthening in bilateral LE's with 3 # ankle weight for heel slides, hip abduction, and SAQ with 5 second hold. x 15 reps x 2 sets bilaterally. Bridging x 10 reps but increased pain so unable to complete reps. Pt able to scoot up in bed with head in trendelenburg and pushign through LE's to reposition.  Therapy Documentation Precautions:  Precautions Precautions: Cervical;Fall Required Braces or Orthoses: Cervical Brace Cervical Brace: Soft collar Restrictions Weight Bearing Restrictions: No  Pain: C/o shoulder pain, neck pain, and sacral pain ranging from 4 to 6/10 - premedicated, next scheduled prior to OT.  See FIM for current functional status  Therapy/Group: Individual Therapy  [name] [facility] 03/07/2012, 10:05 AM

## 2012-03-07 NOTE — Progress Notes (Signed)
Physical Therapy Session Note  Patient Details  Name: Frank Brock MRN: [id number] Date of Birth: [birth year]  Today's Date: 03/07/2012 Time: 1478-2956 Time Calculation (min): 30 min  Short Term Goals: Week 2:  PT Short Term Goal 1 (Week 2): Pt will be able to perform bed mobility with min A PT Short Term Goal 2 (Week 2): Pt will be able to transfer with min A PT Short Term Goal 3 (Week 2): Pt will be able to propel w/c mod I on unit PT Short Term Goal 4 (Week 2): Pt will demonstrate dynamic standing balance for pre-gait activities with max A  Skilled Therapeutic Interventions/Progress Updates:    Pt unable to sit greater than 45 degrees and when attempt supine to sit, unable to tolerate and returned to supine with max A. Pt states since the multiple attempts at cathing last night, his pain is unbearable in sitting position. Completed supine LE stretching including knee to chest, piriformis, and trunk rotation stretching to each side x 30 seconds x 3 reps each. Notified RN of limitations and pt unable to further therapy due to increased pain in bladder area and missed 30 minutes of session.  Therapy Documentation Precautions:  Precautions Precautions: Cervical;Fall Required Braces or Orthoses: Cervical Brace Cervical Brace: Soft collar Restrictions Weight Bearing Restrictions: No   Pain: Reports significant bladder pain due catheter issues last night- premedicated and RN aware  See FIM for current functional status  Therapy/Group: Individual Therapy  [name] [facility] 03/07/2012, 2:14 PM

## 2012-03-07 NOTE — Progress Notes (Signed)
Occupational Therapy Session Note  Patient Details  Name: Frank Brock MRN: [id number] Date of Birth: 01/17/1961  Today's Date: 03/07/2012 Time: 4098-1191 Time Calculation (min): 45 min  Short Term Goals: Week 1:  OT Short Term Goal 1 (Week 1): Patient will complete UB dressing sitting edge of bed with moderate assistance OT Short Term Goal 1 - Progress (Week 1): Met OT Short Term Goal 2 (Week 1): Patient will complete LB dressing in supine position with maximal assistance OT Short Term Goal 2 - Progress (Week 1): Met OT Short Term Goal 3 (Week 1): Patient will perform grooming tasks seated at sink with miminal assistance OT Short Term Goal 3 - Progress (Week 1): Met OT Short Term Goal 4 (Week 1): BSC will be introduced to patient for bowel program with nursing OT Short Term Goal 4 - Progress (Week 1): Met  Week 2:  OT Short Term Goal 1 (Week 2): Patient will donn shirt seated edge of bed with minimal assistance OT Short Term Goal 2 (Week 2): Patient will complete LB dressing with moderate assistance at bed level OT Short Term Goal 3 (Week 2): Patient will be introduced to a HEP for BUEs OT Short Term Goal 4 (Week 2): Patient will perform toilet transfers with moderate assistance  Skilled Therapeutic Interventions/Progress Updates:  Patient found supine in bed with no initial complaints of pain. Therapist noticed blood around penis where catheter was placed. Notified RN. After RN cleaned patient, patient engaged in LB bathing & dressing while in supine position. After dressing patient attempted to sit edge of bed to prepare to donn shoes and perform transfer into w/c. Immediately once seated edge of bed patient with excruciating pain in stomach area. Patient stated, "It's my bladder". Patient laid back down and attempted to sit up again, but with same excruciating pain. Left patient supine in bed with call bell & phone within reach and therapist notified RN of patient's complaints of pain  (9/10).   Precautions:  Precautions Precautions: Cervical;Fall Required Braces or Orthoses: Cervical Brace Cervical Brace: Soft collar Restrictions Weight Bearing Restrictions: No  See FIM for current functional status  Therapy/Group: Individual Therapy  Kervens Roper 03/07/2012, 11:26 AM

## 2012-03-08 ENCOUNTER — Inpatient Hospital Stay (HOSPITAL_COMMUNITY): Payer: Medicare HMO | Admitting: Physical Therapy

## 2012-03-08 DIAGNOSIS — L89109 Pressure ulcer of unspecified part of back, unspecified stage: Secondary | ICD-10-CM

## 2012-03-08 MED ORDER — PHENAZOPYRIDINE HCL 200 MG PO TABS
200.0000 mg | ORAL_TABLET | Freq: Three times a day (TID) | ORAL | Status: DC | PRN
  Administered 2012-03-08 – 2012-03-11 (×8): 200 mg via ORAL
  Filled 2012-03-08 (×9): qty 1

## 2012-03-08 NOTE — Progress Notes (Signed)
  ANTIBIOTIC CONSULT NOTE - FOLLOW UP  Pharmacy Consult for Vancomycin Indication: Disseminated MRSA s/p abcess evacuation and minithorocotomy.  No Known Allergies  Patient Measurements: Weight: 139 lb 15.9 oz (63.5 kg)  Vital Signs: Temp: 98.4 F (36.9 C) (02/15 0543) Temp src: Oral (02/15 0543) BP: 148/80 mmHg (02/15 0920) Pulse Rate: 77 (02/15 0543) Intake/Output from previous day: 02/14 0701 - 02/15 0700 In: 720 [P.O.:720] Out: 2225 [Urine:2225] Intake/Output from this shift: Total I/O In: 480 [P.O.:480] Out: -   Labs:  Recent Labs  03/06/12 0620  WBC 9.7  HGB 10.1*  PLT 330   The CrCl is unknown because both a height and weight (above a minimum accepted value) are required for this calculation. No results found for this basename: VANCOTROUGH, VANCOPEAK, VANCORANDOM, GENTTROUGH, GENTPEAK, GENTRANDOM, TOBRATROUGH, TOBRAPEAK, TOBRARND, AMIKACINPEAK, AMIKACINTROU, AMIKACIN,  in the last 72 hours   Microbiology: No results found for this or any previous visit (from the past 720 hour(s)).  Anti-infectives   Start     Dose/Rate Route Frequency Ordered Stop   02/25/12 2300  vancomycin (VANCOCIN) 1,250 mg in sodium chloride 0.9 % 250 mL IVPB     1,250 mg 166.7 mL/hr over 90 Minutes Intravenous Every 12 hours 02/25/12 1737        52 yo M admitted to rehab 02/25/2012 after admission starting 1/7 for SOB confusion and left shoulder pain.  Now s/p abscess evacuation (1/19) and spinal cord decompression (1/23).  Patient noted to have LUE DVT on 1/22.  Pharmacy consulted to dose 6 week course of vancomycin and lovenox.  Events:  No overnight events or complaints   AC: subclavian DVT 1/23.  Now on full lovenox. Considering Xarelto or warf. CBC stable (02/12), scr 0.63 (2/7). Lovenox 60 mg q12h, based on most recent weight (down from admission)  ID: MRSA bacteremia and diffuse spinal infxn on MRI, s/p I&D of chest wall abscess drainage, thoracotomy, and VAC placement with  chest tube 1/19, debridement 1/20. Afebrile, WBC 8 (02/12)  MRSA PCR 1/17 >> POS Resp 1/17 >> MRSA  Blood 1/17 >> MRSA  1/18 bcx x2>> negative 1/19 L chest abscess>> abundant MRSA  1/23 L neck abscess>> few MRSA  Vanc 1/17 >> (3/6)  1/21 VT = 13.5 (on 1gm q8h)  1/24 VT = 17.4 on 1250 q8h  1/27 VT = 17.7 on 1250mg  q8h  2/3 VT = 24.2 on 1250 mg q8h ->chg to q12h  2/9 VT = 14.8 (drawn 1.5 hr late) >> continue same doseTamiflu 1/17 >> 1/19 Zosyn 1/17 >> 1/20 Gent 1/24 >> 1/27 (trough was therapeutic at 0.6)  Vancomycin trough goal 15-20   Renal: UOP at 1.5 ml/kg/hr  HemOnc: Anemia: CBC trending up.    Plan: 1. Cont lovenox to 60mg  sq q12h; monitor weigh 2. Continue vanc 1250mg  iv q12h. Repeat VT, and BMET on 02/17 and weekly thru treatment course unless otherwise clinically indicated 3. F/u plan for oral [facility]   Thank you for allowing pharmacy to be a part of this patients care team.  Lovenia Kim Pharm.D., BCPS Clinical Pharmacist 03/08/2012 1:28 PM Pager: [phone] Phone: [phone]

## 2012-03-08 NOTE — Progress Notes (Signed)
Subjective/Complaints: Patient denies any bladder spasms overnight. No other specific complaints. His appetite is good.  Review of systems: Patient denies chest pain, shortness of breath, abdominal pain, change in bowel movements. Appetite is good.   Objective: Vital Signs: Blood pressure 153/62, pulse 77, temperature 98.4 F (36.9 C), temperature source Oral, resp. rate 18, weight 139 lb 15.9 oz (63.5 kg), SpO2 95.00%.  Physical Exam:   No acute distress. Chest clear to auscultation cardiac exam S1-S2 are regular. Abdominal exam active bowel sounds, soft. Foley catheter is in place. Extremities no clubbing cyanosis or edema. Neurologic exam is alert. He is able to move all 4 extremities.  Assessment/Plan: 1. 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 Pt with central cord clinical picture. He is s/p surgical decompression  Medical Problem List and Plan:  1. 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  2. Pain Management: Cervical collar prn for comfort.   3. Mood: Seems to have a good outlook. No active issues at present.  4. Neuropsych: This patient is capable of making decisions on his/her own behalf.  5. Disseminated MRSA: S/P evacuation of abscess and minithorocotomy Vancomycin 6 weeks post-op from (1/23)  6. Anemia due to infection/crutical illness: Added iron supplement.  CBC:    Component Value Date/Time   WBC 9.7 03/06/2012 0620   WBC 35.3* 02/06/2012 0942   HGB 10.1* 03/06/2012 0620   HGB 13.5 02/06/2012 0942   HCT 31.2* 03/06/2012 0620   HCT 39.0 02/06/2012 0942   PLT 330 03/06/2012 0620   PLT 432* 02/06/2012 0942   7. Hyponatremia: stable/improved--recheck monday 8. Abnormal LFTs: Likely due to sepsis. No GI symptoms. Stable to improved today 9. Neurogenic bowel/bladder- stools more formed  -dc'ed suppository, working on timed bowel movements also  -probiotic  added  -may have prostate  hypertrophy  As well.  -foley replaced. Will start flomax  -consider another voiding trial next week 10. Wound care: wet to dry to chest wound-looks great  -continue santyl to sacral wound daily  -pt is directing pressure relief, turning  -hydrotherapy provided x 1.   LOS (Days) 12 A FACE TO FACE EVALUATION WAS PERFORMED  [facility] HENRY 03/08/2012 9:18 AM

## 2012-03-08 NOTE — Progress Notes (Signed)
Physical Therapy Session Note  Patient Details  Name: Frank Brock MRN: [id number] Date of Birth: Apr 03, 1960  Today's Date: 03/08/2012 Time: 1445-1530 Time Calculation (min): 45 min  Short Term Goals: Week 1:  PT Short Term Goal 1 (Week 1): Pt will be able to demonstrate bed mobility with mod A PT Short Term Goal 1 - Progress (Week 1): Not met PT Short Term Goal 2 (Week 1): Pt will be able to transfer with mod A PT Short Term Goal 2 - Progress (Week 1): Met PT Short Term Goal 3 (Week 1): Pt will be able to demonstrate dynamic sitting balance with mod A during functional task PT Short Term Goal 3 - Progress (Week 1): Met PT Short Term Goal 4 (Week 1): Pt will be able to propel w/c x 100' with S PT Short Term Goal 4 - Progress (Week 1): Met  Therapy Documentation Precautions:  Precautions: Cervical;Fall Required Braces or Orthoses: Cervical Brace Cervical Brace: Soft collar Restrictions Weight Bearing Restrictions: No Pain: Pain Assessment: 0-10 Pain Score:   6 Pain Type: Acute pain Pain Location: Buttocks and bladder Pain Orientation: Mid Pain Descriptors: Aching;Sore Pain Frequency: Intermittent Pain Onset: Gradual Pain Intervention(s): Medication (See eMAR), repositioning  Therapeutic Activity:(15') Bed mobility with min-A, to scoot to [name] and Mod-I rolling side-to-side Wheelchair Management:(15') Propelling recliner w/c 2 x 150' S/Independent but needs Max-A with parts management Therapeutic Exercise:(15') stretching to B LE's in supine  See FIM for current functional status  Therapy/Group: Individual Therapy  Rex Kras 03/08/2012, 4:27 PM

## 2012-03-09 ENCOUNTER — Inpatient Hospital Stay (HOSPITAL_COMMUNITY): Payer: Medicare HMO

## 2012-03-09 LAB — CBC
HCT: 31.4 % — ABNORMAL LOW (ref 39.0–52.0)
Hemoglobin: 10.4 g/dL — ABNORMAL LOW (ref 13.0–17.0)
MCH: 29 pg (ref 26.0–34.0)
MCHC: 33.1 g/dL (ref 30.0–36.0)
MCV: 87.5 fL (ref 78.0–100.0)
RBC: 3.59 MIL/uL — ABNORMAL LOW (ref 4.22–5.81)

## 2012-03-09 NOTE — Progress Notes (Signed)
Subjective/Complaints: Has some bladder pain when he stands or sits up quickly.  Phlebotomy unable to draw lab from PICC.   Review of systems: Patient denies chest pain, shortness of breath, abdominal pain, change in bowel movements. Appetite is good.   Objective: Vital Signs: Blood pressure 125/77, pulse 72, temperature 98 F (36.7 C), temperature source Oral, resp. rate 17, weight 136 lb 0.4 oz (61.7 kg), SpO2 95.00%.  Physical Exam:   No acute distress. Chest clear to auscultation cardiac exam S1-S2 are regular. Abdominal exam active bowel sounds, soft. Foley catheter is in place. Extremities no clubbing cyanosis or edema. Neurologic exam is alert. He is able to move all 4 extremities.  Assessment/Plan: 1. 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 Pt with central cord clinical picture. He is s/p surgical decompression  Medical Problem List and Plan:  1. 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  2. Pain Management: Cervical collar prn for comfort.   3. Mood: pleasant, eager to get better 4. Neuropsych: This patient is capable of making decisions on his/her own behalf.  5. Disseminated MRSA: S/P evacuation of abscess and minithorocotomy Vancomycin 6 weeks post-op from (1/23)  6. Anemia due to infection/crutical illness: Added iron supplement.  CBC:    Component Value Date/Time   WBC 9.7 03/06/2012 0620   WBC 35.3* 02/06/2012 0942   HGB 10.1* 03/06/2012 0620   HGB 13.5 02/06/2012 0942   HCT 31.2* 03/06/2012 0620   HCT 39.0 02/06/2012 0942   PLT 330 03/06/2012 0620   PLT 432* 02/06/2012 0942   7. Hyponatremia: stable/improved--recheck monday 8. Abnormal LFTs: Likely due to sepsis. No GI symptoms. Stable to improved today 9. Neurogenic bowel/bladder- stools more formed  -dc'ed suppository, working on timed bowel movements also  -probiotic  added  -may have prostate hypertrophy  As well.  -foley  replaced. Will start flomax  -consider another voiding trial next week 10. Wound care: wet to dry to chest wound-looks great  -continue santyl to sacral wound daily  -pt is directing pressure relief, turning  -hydrotherapy provided x 1.  Order written to draw labs from foot LOS (Days) 13 A FACE TO FACE EVALUATION WAS PERFORMED  Frank Brock Frank Brock 03/09/2012 9:05 AM

## 2012-03-09 NOTE — Plan of Care (Signed)
Problem: RH PAIN MANAGEMENT Goal: RH STG PAIN MANAGED AT OR BELOW PT'S PAIN GOAL Less or equal to 3  Outcome: Not Progressing Patient having at least 6 out of 10 pain from bladder spasms now with the spasm medication

## 2012-03-09 NOTE — Progress Notes (Signed)
Physical Therapy Session Note  Patient Details  Name: Frank Brock MRN: [id number] Date of Birth: 11-30-1960  Today's Date: 03/09/2012 Time: 1308-6578 Time Calculation (min): 53 min  Skilled Therapeutic Interventions/Progress Updates:    Pt reports he was able to get OOB with therapy yesterday for a short period of time. Mod A for supine to sit (pt quick to request help due to pain) and S sitting balance while therapist assisted with putting on shoes. Min A squat pivot transfer to w/c. W/c propulsion to gym for endurance and strengthening with rest breaks as needed. In parallel bars worked on sit to stands (min A), static and dynamic standing balance and pre-gait activities including weightshifting and stepping, progressing to taking a few steps forwards and then backwards. Pt reports "knees feel weaker today" and unable to tolerate longer standing periods. Seated in w/c used Kinetron for functional strengthening of LE's with about 1 min intervals before pt reports pain is too much and request to return back to room. Assisted pt with mod A stand pivot transfer back to bed and repositioned in supine. Elevated HOB for pt to try to use computer and notified RN of increased "bladder pain" ultimately limiting therapy session. Pt verbalizes frustration with "bladder situation" and how it is affecting his tolerance to therapy - emotional support provided.  Therapy Documentation Precautions:  Precautions Precautions: Cervical;Fall Required Braces or Orthoses: Cervical Brace Cervical Brace: Soft collar Restrictions Weight Bearing Restrictions: No   Pain: Premedicated - increased pain by end of session requiring pt return to room to lay supine due to increased "bladder pain". RN notified and aware.   Locomotion : Ambulation Ambulation/Gait Assistance: 3: Mod assist   See FIM for current functional status  Therapy/Group: Individual Therapy  [name] [facility] 03/09/2012, 1:55 PM

## 2012-03-10 ENCOUNTER — Inpatient Hospital Stay (HOSPITAL_COMMUNITY): Payer: Medicare HMO

## 2012-03-10 ENCOUNTER — Inpatient Hospital Stay (HOSPITAL_COMMUNITY): Payer: Medicare HMO | Admitting: Occupational Therapy

## 2012-03-10 DIAGNOSIS — N319 Neuromuscular dysfunction of bladder, unspecified: Secondary | ICD-10-CM

## 2012-03-10 LAB — URINE MICROSCOPIC-ADD ON

## 2012-03-10 LAB — URINALYSIS, ROUTINE W REFLEX MICROSCOPIC
Glucose, UA: NEGATIVE mg/dL
Ketones, ur: NEGATIVE mg/dL
Nitrite: POSITIVE — AB
Specific Gravity, Urine: 1.023 (ref 1.005–1.030)
pH: 7 (ref 5.0–8.0)

## 2012-03-10 LAB — BASIC METABOLIC PANEL
BUN: 19 mg/dL (ref 6–23)
Creatinine, Ser: 0.9 mg/dL (ref 0.50–1.35)
GFR calc non Af Amer: >90 mL/min (ref >90)
Glucose, Bld: 107 mg/dL — ABNORMAL HIGH (ref 70–99)
Potassium: 4.6 mEq/L (ref 3.5–5.1)

## 2012-03-10 MED ORDER — OXYBUTYNIN CHLORIDE 5 MG PO TABS
5.0000 mg | ORAL_TABLET | Freq: Three times a day (TID) | ORAL | Status: DC
  Administered 2012-03-10 – 2012-03-13 (×11): 5 mg via ORAL
  Filled 2012-03-10 (×16): qty 1

## 2012-03-10 NOTE — Progress Notes (Signed)
Occupational Therapy Session Notes  Patient Details  Name: Frank Brock MRN: [id number] Date of Birth: 09-Aug-1960  Today's Date: 03/10/2012  Short Term Goals: Week 1:  OT Short Term Goal 1 (Week 1): Patient will complete UB dressing sitting edge of bed with moderate assistance OT Short Term Goal 1 - Progress (Week 1): Met OT Short Term Goal 2 (Week 1): Patient will complete LB dressing in supine position with maximal assistance OT Short Term Goal 2 - Progress (Week 1): Met OT Short Term Goal 3 (Week 1): Patient will perform grooming tasks seated at sink with miminal assistance OT Short Term Goal 3 - Progress (Week 1): Met OT Short Term Goal 4 (Week 1): BSC will be introduced to patient for bowel program with nursing OT Short Term Goal 4 - Progress (Week 1): Met  Week 2:  OT Short Term Goal 1 (Week 2): Patient will donn shirt seated edge of bed with minimal assistance OT Short Term Goal 2 (Week 2): Patient will complete LB dressing with moderate assistance at bed level OT Short Term Goal 3 (Week 2): Patient will be introduced to a HEP for BUEs OT Short Term Goal 4 (Week 2): Patient will perform toilet transfers with moderate assistance  Skilled Therapeutic Interventions/Progress Updates:   Session #1 1105-1200 - 55 Minutes Individual Therapy Patient with 7/10 pain in neck and sacrum, RN aware Patient found supine in bed. Engaged in bed mobility for LB bathing & dressing. Patient remained in supine for washing of hair using dry shampoo cap. Patient then performed supine -> sit with supervision and edge of bed -> w/c squat pivot transfer with supervision. Patient then sat at sink for UB bathing & dressing and grooming tasks. Focused skilled intervention on pain management (gas pain, sacrum pain, neck pain), overall activity tolerance/endurance, UB/LB bathing & dressing, grooming tasks, functional use of bilateral UEs, and bed mobility. At end of session left patient seated in w/c with  lunch tray and call bell & phone within reach.   Session #2 [id number] - 45 Minutes Individual Therapy Patient with complaints of bladder pain and gas pains Patient found supine in bed. Patient performed supine -> sit with supervision. Patient then transferred edge of bed -> w/c with supervision and propelled self from room -> therapy gym. Once in therapy gym patient transferred onto therapy mat with supervision and performed sit-> supine with supervision. Patient then engaged in therapeutic UE exercises using 2lb weighted bar and two 2lb dumbbells (shoulders and triceps). Patient left supine on mat for next therapy session.   Precautions:  Precautions Precautions: Cervical;Fall Required Braces or Orthoses: Cervical Brace Cervical Brace: Soft collar Restrictions Weight Bearing Restrictions: No  See FIM for current functional status  Layken Doenges 03/10/2012, 8:29 AM

## 2012-03-10 NOTE — Progress Notes (Signed)
Physical Therapy Session Note  Patient Details  Name: Frank Brock MRN: [id number] Date of Birth: 1961-01-21  Today's Date: 03/10/2012 Time: 1430-1500 Time Calculation (min): 30 min  Skilled Therapeutic Interventions/Progress Updates:    supine therex for LE strengthening including heel slides, hip abduction, and bridging x 15 reps each bilaterally x 2 sets with 3# ankle weight on RLE and 4# ankle weight on LLE. Steady A squat/stand pivot transfers to transfer back to w/c and then back to bed. Pt reports incontinent BM; nurse tech notified.   Therapy Documentation Precautions:  Precautions Precautions: Cervical;Fall Required Braces or Orthoses: Cervical Brace Cervical Brace: Soft collar Restrictions Weight Bearing Restrictions: No   Pain:  Reports pain is ok in supine, increases with sitting upright. Premedicated.  See FIM for current functional status  Therapy/Group: Individual Therapy  [name] [facility] 03/10/2012, 3:04 PM

## 2012-03-10 NOTE — Progress Notes (Signed)
Subjective/Complaints: Having bladder spasms. meds not holding long enough. A 12 point review of systems has been performed and if not noted above is otherwise negative.   Objective: Vital Signs: Blood pressure 129/71, pulse 71, temperature 97.9 F (36.6 C), temperature source Oral, resp. rate 18, weight 60.8 kg (134 lb 0.6 oz), SpO2 96.00%. No results found.  Recent Labs  03/09/12 0914  WBC 9.9  HGB 10.4*  HCT 31.4*  PLT 333   No results found for this basename: NA, K, CL, CO, GLUCOSE, BUN, CREATININE, CALCIUM,  in the last 72 hours CBG (last 3)  No results found for this basename: GLUCAP,  in the last 72 hours  Wt Readings from Last 3 Encounters:  03/10/12 60.8 kg (134 lb 0.6 oz)  02/25/12 76.2 kg (167 lb 15.9 oz)  02/25/12 76.2 kg (167 lb 15.9 oz)    Physical Exam:  Constitutional: He is oriented to person, place, and time. He appears well-developed. He has a sickly appearance.  Cardiovascular: Normal rate and regular rhythm.  Pulmonary/Chest: Effort normal. He has rales in the left middle field.  Abdominal: Soft. Bowel sounds are normal.  Musculoskeletal: He exhibits no tenderness.  Minimal edema left forearm. Bilateral heels boggy--non tender.  Neurological: He is alert and oriented to person, place, and time. A bit distracted but generally appropriate  decreased sensation rectally. Decreased sensation to LT in both hands and feet.  DTR's 1+, no resting tone. Good voice/phonation, cognition is generally intact.  MOTOR TESTING:  Muscle Right Left  Deltoid 3+ 3 + Triceps 4  4 Biceps 4  4 Wrist ext 4 4 HI 3 3  HF 3+ 3+  KE 3+ 3+ ADF 4 4  APF 4 4  Skin:  Posterior neck incision clean and dry with steri strips in place. Lower chest wall with sutures anteriorly. Upper left chest wall with VAC in place and sealed. Sacrum with small area fibronecrotic tissue near sacrum which really hasn't changed over the last few days.   Assessment/Plan: 1. 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 Pt with central cord clinical picture. He is s/p surgical decompression which require 3+ hours per day of interdisciplinary therapy in a comprehensive inpatient rehab setting. Physiatrist is providing close team supervision and 24 hour management of active medical problems listed below. Physiatrist and rehab team continue to assess barriers to discharge/monitor patient progress toward functional and medical goals. FIM: FIM - Bathing Bathing Steps Patient Completed: Right upper leg;Left upper leg;Right lower leg (including foot);Left lower leg (including foot) Bathing: 5: Supervision: Safety issues/verbal cues (supervision for legs)  FIM - Upper Body Dressing/Undressing Upper body dressing/undressing steps patient completed: Thread/unthread right sleeve of pullover shirt/dresss;Thread/unthread left sleeve of pullover shirt/dress;Pull shirt over trunk Upper body dressing/undressing: 0: Activity did not occur FIM - Lower Body Dressing/Undressing Lower body dressing/undressing steps patient completed: Thread/unthread right pants leg;Thread/unthread left pants leg;Pull pants up/down;Don/Doff right sock Lower body dressing/undressing: 4: Min-Patient completed 75 plus % of tasks  FIM - Toileting Toileting: 0: Activity did not occur  FIM - Diplomatic Services operational officer Devices: Bedside commode (drop arm) Toilet Transfers: 0-Activity did not occur  FIM - Banker Devices: Bed rails;Arm rests Bed/Chair Transfer: 3: Supine > Sit: Mod A (lifting assist/Pt. 50-74%/lift 2 legs;4: Bed > Chair or W/C: Min A (steadying Pt. > 75%);3: Chair or W/C > Bed: Mod A (lift or lower assist);5: Sit > Supine:  Supervision (verbal cues/safety issues)  FIM - Locomotion: Wheelchair Distance: 150 Locomotion: Wheelchair: 5: Travels 150 ft or more: maneuvers on rugs  and over door sills with supervision, cueing or coaxing FIM - Locomotion: Ambulation Locomotion: Ambulation Assistive Devices: Parallel bars Ambulation/Gait Assistance: 3: Mod assist Locomotion: Ambulation: 1: Travels less than 50 ft with moderate assistance (Pt: 50 - 74%)  Comprehension Comprehension Mode: Auditory Comprehension: 5-Follows basic conversation/direction: With no assist  Expression Expression Mode: Verbal Expression: 5-Expresses basic needs/ideas: With no assist  Social Interaction Social Interaction: 6-Interacts appropriately with others with medication or extra time (anti-anxiety, antidepressant).  Problem Solving Problem Solving: 5-Solves basic problems: With no assist  Memory Memory: 6-More than reasonable amt of time  Medical Problem List and Plan:  1. 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  2. Pain Management: Cervical collar prn for comfort.   3. Mood: Seems to have a good outlook. No active issues at present.  4. Neuropsych: This patient is capable of making decisions on his/her own behalf.  5. Disseminated MRSA: S/P evacuation of abscess and minithorocotomy Vancomycin 6 weeks post-op from (1/23)  6. Anemia due to infection/crutical illness: Added iron supplement.  7. Hyponatremia: stable/improved--recheck monday 8. Abnormal LFTs: Likely due to sepsis. No GI symptoms. Stable to improved today 9. Neurogenic bowel/bladder- now having bladder spasms  -dc'ed suppository, working on timed bowel movements also  -probiotic  added  -may have prostate hypertrophy  As well.  -foley replaced. flomax  -add ditropan, stop urispas.  -check urine sample  10. Wound care: wet to dry to chest wound-filling in nicely  -continue santyl to sacral wound daily-this is closing also  -pt is directing pressure relief, turning   .   LOS (Days) 14 A FACE TO FACE EVALUATION WAS PERFORMED  SWARTZ,ZACHARY T 03/10/2012 8:18 AM

## 2012-03-10 NOTE — Progress Notes (Signed)
  ANTIBIOTIC CONSULT NOTE - FOLLOW UP  Pharmacy Consult for Vancomycin Indication: Disseminated MRSA s/p abcess evacuation and minithorocotomy.  No Known Allergies  Patient Measurements: Weight: 134 lb 0.6 oz (60.8 kg)  Vital Signs: Temp: 98.2 F (36.8 C) (02/17 1528) Temp src: Oral (02/17 1528) BP: 135/76 mmHg (02/17 1528) Pulse Rate: 78 (02/17 1528) Intake/Output from previous day: 02/16 0701 - 02/17 0700 In: 960 [P.O.:960] Out: 2900 [Urine:2900] Intake/Output from this shift: Total I/O In: 480 [P.O.:480] Out: 425 [Urine:425]  Labs:  Recent Labs  03/09/12 0914 03/10/12 1603  WBC 9.9  --   HGB 10.4*  --   PLT 333  --   CREATININE  --  0.90   The CrCl is unknown because both a height and weight (above a minimum accepted value) are required for this calculation.  Recent Labs  03/10/12 1603  VANCOTROUGH 16.4     Microbiology: No results found for this or any previous visit (from the past 720 hour(s)).  Anti-infectives   Start     Dose/Rate Route Frequency Ordered Stop   02/25/12 2300  vancomycin (VANCOCIN) 1,250 mg in sodium chloride 0.9 % 250 mL IVPB     1,250 mg 166.7 mL/hr over 90 Minutes Intravenous Every 12 hours 02/25/12 1737        52 yo M admitted to rehab 02/25/2012 after admission starting 1/7 for SOB confusion and left shoulder pain. Now s/p abscess evacuation (1/19) and spinal cord decompression (1/23). Patient noted to have LUE DVT on 1/22. Pharmacy consulted to dose 6 week course of vancomycin and lovenox.  AC: subclavian DVT 1/23. Now on full lovenox. Considering Xarelto or warf. CBC stable, scr 0.9 (2/17). Lovenox 60 mg q12h, based on most recent weight (down from admission)  ID: MRSA bacteremia and diffuse spinal infxn on MRI, s/p I&D of chest wall abscess drainage, thoracotomy, and VAC placement with chest tube 1/19, debridement 1/20. Afebrile, WBC 9.9 (2/16)  2/17 urine>> MRSA PCR 1/17 >> POS Resp 1/17 >> MRSA  Blood 1/17 >> MRSA  1/18  bcx x2>> negative 1/19 L chest abscess>> abundant MRSA  1/23 L neck abscess>> few MRSA  Vanc 1/17 >> (3/6) 1/21 VT = 13.5 (on 1gm q8h) 1/24 VT = 17.4 on 1250 q8h 1/27 VT = 17.7 on 1250mg  q8h 2/3 VT = 24.2 on 1250 mg q8h ->chg to q12h 2/9 VT = 14.8 (drawn 1.5 hr late) >> continue same dose 2/17 VT = 16.4 (drawn 1.5 hr late) >> slight accumulation from last week's trough but still within goal. No change.  Tamiflu 1/17 >> 1/19 Zosyn 1/17 >> 1/20 Gent 1/24 >> 1/27 (trough was therapeutic at 0.6) Vancomycin trough goal 15-20  Renal: SCr 0.9 (2/17) UOP 0.6 ml/kg/hr HemOnc: Anemia: CBC trending up.   Assessment: Vancomycin trough  therapeutic on current regimen.  Noted slight increase compared to last week's Vancomycin trough likely 2/2 accumulation, however this is insignificant at this time.  Will continue to follow.  Anticipate antibiotic course to continue through March 6.  Plan: 1. Cont lovenox to 60mg  sq q12h; monitor weight 2. Continue vanc 1250mg  iv q12h. Repeat VT, and BMET on 02/24 and weekly thru treatment course unless otherwise clinically indicated 3. F/u plan for oral AC  Ernie Kasler, Pharm.D., BCPS Clinical Pharmacist Pager [phone] 03/10/2012 5:16 PM

## 2012-03-10 NOTE — Progress Notes (Signed)
Physical Therapy Session Note  Patient Details  Name: Frank Brock MRN: [id number] Date of Birth: Jun 08, 1960  Today's Date: 03/10/2012 Time: 7829-5621 Time Calculation (min): 51 min (missed 9 min of skilled PT due to pain)  Short Term Goals: Week 2:  PT Short Term Goal 1 (Week 2): Pt will be able to perform bed mobility with min A PT Short Term Goal 2 (Week 2): Pt will be able to transfer with min A PT Short Term Goal 3 (Week 2): Pt will be able to propel w/c mod I on unit PT Short Term Goal 4 (Week 2): Pt will demonstrate dynamic standing balance for pre-gait activities with max A  Skilled Therapeutic Interventions/Progress Updates:    Bed mobility and transferred OOB with stand pivot technique; pt reported that he had a "dirty diaper", returned to bed to change brief and clean up patient. Pt able to roll independently and pull pants up/down with bridging technique. RN notified and dressing not on wound on bottom. Transferred back into w/c stand pivot technique with mod A; cues to slow down for safe set up and positioning. Sit to stands and weight shifting for pre-gait activities in parallel bars (pt declined attempting with RW today from mat) with overall min A while bilateral LE's WB; when one LE lifts off of ground requires mod A due to ataxic movement patterns. Nustep for functional strengthening and endurance with LE's only x 4:50 and 200 steps; had to stop due to bladder pain and request to return to room. Repositioned in supine after pt set up w/c for transfer including managing legrests. Recommended pt to do LE stretching in the bed, but reports needing to wait until pain subsides. RN aware.  Therapy Documentation Precautions:  Precautions Precautions: Cervical;Fall Required Braces or Orthoses: Cervical Brace Cervical Brace: Soft collar Restrictions Weight Bearing Restrictions: No   Pain: Premedicated - bladder has been OK so far initially in session and then increased as up  during therapy; neck pain ongoing.  See FIM for current functional status  Therapy/Group: Individual Therapy  [name] [facility] 03/10/2012, 10:56 AM

## 2012-03-11 ENCOUNTER — Inpatient Hospital Stay (HOSPITAL_COMMUNITY): Payer: Medicare HMO

## 2012-03-11 ENCOUNTER — Inpatient Hospital Stay (HOSPITAL_COMMUNITY): Payer: Medicare HMO | Admitting: Physical Therapy

## 2012-03-11 ENCOUNTER — Inpatient Hospital Stay (HOSPITAL_COMMUNITY): Payer: Medicare HMO | Admitting: Occupational Therapy

## 2012-03-11 LAB — URINE CULTURE

## 2012-03-11 NOTE — Progress Notes (Signed)
NUTRITION FOLLOW UP  Patient meets criteria for Severe malnutrition related to acute illness AEB >20 lb weight loss in the last 2 weeks (12%) and decreased muscle mass and body fat.   Intervention:   1. Continue Resource Breeze po TID and Beneprotein po TID 2. RD to continue to follow nutrition care plan  Nutrition Dx:   Inadequate oral intake related to poor appetite as evidenced by documentation; resolved.  New Nutrition Dx: Increased nutrient needs r/t wound healing AEB estimated needs.  Goal:   Pt to meet >/= 90% of their estimated nutrition needs; progressing.   Monitor:   PO intake, weight, wounds  Assessment:   Continues with hydrotherapy. Working towards 2/28 d/c. Meal intake remains optimal, consuming 75 - 100%.  Receiving Resource Breeze TID and Beneprotein TID.  Height: Ht Readings from Last 1 Encounters:  02/10/12 5\' 10"  (1.778 m)    Weight Status:  Admission weight 159 lb Wt Readings from Last 1 Encounters:  03/11/12 133 lb 13.1 oz (60.7 kg)  trending down  Estimated needs:  Kcal: 2200-2300  Protein: 110-130 gm  Fluid: 2.2-2.3L  Skin: Chest wound with VAC, unstageable pressure ulcer on coccyx  Diet Order: Cardiac; Resource Breeze PO TID Meal Completion: 75-100%   Intake/Output Summary (Last 24 hours) at 03/11/12 0955 Last data filed at 03/11/12 0558  Gross per 24 hour  Intake    720 ml  Output   3175 ml  Net  -2455 ml    Last BM: 2/17   Labs:   Recent Labs Lab 03/10/12 1603  NA 133*  K 4.6  CL 100  CO2 21  BUN 19  CREATININE 0.90  CALCIUM 9.3  GLUCOSE 107*    CBG (last 3)  No results found for this basename: GLUCAP,  in the last 72 hours  Scheduled Meds: . alteplase  2 mg Intracatheter Once  . amLODipine  10 mg Oral Daily  . antiseptic oral rinse  1 application Mouth Rinse QID  . chlorhexidine  15 mL Mouth Rinse BID  . diphenhydrAMINE  50 mg Oral Q2000  . enoxaparin (LOVENOX) injection  60 mg Subcutaneous Q12H  . feeding  supplement  1 Container Oral TID BM  . magic mouthwash  5 mL Oral QID  . oxybutynin  5 mg Oral TID  . protein supplement  1 scoop Oral TID WC  . saccharomyces boulardii  250 mg Oral BID  . Tamsulosin HCl  0.4 mg Oral QPC supper  . vancomycin  1,250 mg Intravenous Q12H    Continuous Infusions:  none  Jarold Motto MS, RD, LDN Pager: [phone] After-hours pager: [phone]

## 2012-03-11 NOTE — Progress Notes (Signed)
Occupational Therapy Session Note  Patient Details  Name: Frank Brock MRN: [id number] Date of Birth: [birth year]  Today's Date: 03/11/2012 Time: 1440-1520 Time Calculation (min): 40 min  Short Term Goals: Week 2:  OT Short Term Goal 1 (Week 2): Patient will donn shirt seated edge of bed with minimal assistance OT Short Term Goal 2 (Week 2): Patient will complete LB dressing with moderate assistance at bed level OT Short Term Goal 3 (Week 2): Patient will be introduced to a HEP for BUEs OT Short Term Goal 4 (Week 2): Patient will perform toilet transfers with moderate assistance  Skilled Therapeutic Interventions/Progress Updates:  Patient resting in bed upon arrival. Engaged in BUE AROM and strengthening, core strengthening sitting EOB with feet unsupported due to high surface, lateral weight shifts and extreme sitting balance challenged to solicit righting reactions. Dynamic standing balance activity with and without the RW.  Patient with significant LOB posteriorly resulting in need to sit on the bed.  Patient reported need to lay down due to pain in neck and back.  Resumed core, hip and BUE exercises in supine.  Patient declined opportunity to sit in w/c to wait on next therapy session.  Patient left in bed with all items within reach.  Therapy Documentation Precautions:  Precautions Precautions: Cervical;Fall Required Braces or Orthoses: Cervical Brace Cervical Brace: Applied in sitting position Restrictions Weight Bearing Restrictions: No Pain: Pain in neck and lower back during acitivty, medication provided, distraction, change in position and rest.  Therapy/Group: Individual Therapy  Oakley Orban 03/11/2012, 4:15 PM

## 2012-03-11 NOTE — Progress Notes (Signed)
Physical Therapy Note  Patient Details  Name: Frank Brock MRN: [id number] Date of Birth: 1960/12/21 Today's Date: 03/11/2012  1600-1625 (25 minutes) individual Pain: no complaint of pain Focus of treatment: gait training Treatment: Pt in bed upon arrival incontinent of stool; Pt required assist with hygiene and donning clean brief; wc mobility SBA unit 120 feet ( assist with IV); gait 25 feet X 2 with RW mod assist with max assist X 2 with loss of balance  during turns secondary to decrease base of support.   Myanna Ziesmer,JIM 03/11/2012, 4:40 PM

## 2012-03-11 NOTE — Consult Note (Signed)
WOC follow up  Wound type: 1. S/P debridement/evacuation left chest wall abscess  2. Unstageable pressure ulcer apex of the gluteal skin folds Wound bed: 1: clean, 100% granulation pink and moist 2. 90% yellow slough, adherent, with 10% pink wound edges Drainage (amount, consistency, odor) both wounds with minimal serous drainage, no odor Periwound: both intact Dressing procedure/placement/frequency: continue hydrogel dressing for the chest wall daily. Restart Santyl for the pressure ulcer to clear the yellow slough away.  Turn off this area as much as possible, stressed to the patient.    WOC will follow along at least weekly for wound progression Paiden Cavell [facility] RN,CWOCN [phone]

## 2012-03-11 NOTE — Progress Notes (Signed)
Occupational Therapy Session Note  Patient Details  Name: Frank Brock MRN: [id number] Date of Birth: Sep 09, 1960  Today's Date: 03/11/2012 Time: 0902-1001 Time Calculation (min): 59 min  Skilled Therapeutic Interventions/Progress Updates:    Worked on bathing and dressing during session.  Pt performed LB bathing and dressing in supine position with setup and decreased thoroughness for washing his LEs. He performed bridging to pull his pants over his hips with supervision and crosses his LEs for donning socks.  Transitioned to sitting at the sink to perform UB bathing and grooming.  Pt needing min assist with shoulder flexion to reach the back of his head for combing his hair.  Able to thread his arms through his shirt but unable to lift it over his head.   Therapist discussed briefly and educated pt on possible AE for helping to donn LB clothing in sitting instead of supine including reacher and sockaide but did not attempt use.   Therapy Documentation Precautions:  Precautions Precautions: Cervical;Fall Required Braces or Orthoses: Cervical Brace Cervical Brace: Applied in sitting position Restrictions Weight Bearing Restrictions: No  Pain: Pain Assessment Pain Assessment: No/denies pain Pain Score:   5 Pain Type: Other (Comment) (surgical) Pain Location: Neck Pain Orientation: Upper Pain Descriptors: Aching Pain Frequency: Intermittent Pain Onset: On-going  See FIM for current functional status  Therapy/Group: Individual Therapy  Nicosha Struve OTR/L 03/11/2012, 11:47 AM

## 2012-03-11 NOTE — Progress Notes (Signed)
Physical Therapy Session Note  Patient Details  Name: DINERO CHAVIRA MRN: [id number] Date of Birth: 29-May-1960  Today's Date: 03/11/2012 Time: 1300-1355 Time Calculation (min): 55 min  Short Term Goals: Week 2:  PT Short Term Goal 1 (Week 2): Pt will be able to perform bed mobility with min A PT Short Term Goal 2 (Week 2): Pt will be able to transfer with min A PT Short Term Goal 3 (Week 2): Pt will be able to propel w/c mod I on unit PT Short Term Goal 4 (Week 2): Pt will demonstrate dynamic standing balance for pre-gait activities with max A  Skilled Therapeutic Interventions/Progress Updates:   Focused on bed mobility, transfer training (squat and stand pivot and progressed to stand step with RW with emphasis on hand placement and safe technique), simulated car transfer (with steady A) and discussed pros and cons of various vehicles, and sit to stands with RW with min A and pre-gait in standing (decreased coordination of LE's and tends to narrow BOS). Returned to bed end of session and pt able to reposition independently.   Also discussed end of this week changing to a standard w/c (non-reclining) for preparation for home as well as discussion of doorway measurements for family to obtain.   Therapy Documentation Precautions:  Precautions Precautions: Cervical;Fall Required Braces or Orthoses: Cervical Brace Cervical Brace: Applied in sitting position Restrictions Weight Bearing Restrictions: No   Pain:  Premedicated for pain - reports bladder and gas pain is getting better today  See FIM for current functional status  Therapy/Group: Individual Therapy  [name] [facility] 03/11/2012, 2:03 PM

## 2012-03-11 NOTE — Progress Notes (Signed)
Subjective/Complaints: Bladder spasms better. Had a better night. A 12 point review of systems has been performed and if not noted above is otherwise negative.   Objective: Vital Signs: Blood pressure 129/63, pulse 81, temperature 97.9 F (36.6 C), temperature source Oral, resp. rate 19, weight 60.7 kg (133 lb 13.1 oz), SpO2 98.00%. No results found.  Recent Labs  03/09/12 0914  WBC 9.9  HGB 10.4*  HCT 31.4*  PLT 333    Recent Labs  03/10/12 1603  NA 133*  K 4.6  CL 100  GLUCOSE 107*  BUN 19  CREATININE 0.90  CALCIUM 9.3   CBG (last 3)  No results found for this basename: GLUCAP,  in the last 72 hours  Wt Readings from Last 3 Encounters:  03/11/12 60.7 kg (133 lb 13.1 oz)  02/25/12 76.2 kg (167 lb 15.9 oz)  02/25/12 76.2 kg (167 lb 15.9 oz)    Physical Exam:  Constitutional: He is oriented to person, place, and time. He appears well-developed. He has a sickly appearance.  Cardiovascular: Normal rate and regular rhythm.  Pulmonary/Chest: Effort normal. He has rales in the left middle field.  Abdominal: Soft. Bowel sounds are normal.  Musculoskeletal: He exhibits no tenderness.  Minimal edema left forearm. Bilateral heels boggy--non tender.  Neurological: He is alert and oriented to person, place, and time. A bit distracted but generally appropriate  decreased sensation rectally. Decreased sensation to LT in both hands and feet.  DTR's 1+, no resting tone. Good voice/phonation, cognition is generally intact.  MOTOR TESTING:  Muscle Right Left  Deltoid 3+ 3 + Triceps 4  4 Biceps 4  4 Wrist ext 4 4 HI 3 3  HF 3+ 3+  KE 3+ 3+ ADF 4 4  APF 4 4  Skin:  Posterior neck incision clean and dry with steri strips in place. Lower chest wall with sutures anteriorly. Upper left chest wall with VAC in place and sealed. Sacrum with small area fibronecrotic tissue near sacrum which really hasn't changed over the last few days.   Assessment/Plan: 1. 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 Pt with central cord clinical picture. He is s/p surgical decompression which require 3+ hours per day of interdisciplinary therapy in a comprehensive inpatient rehab setting. Physiatrist is providing close team supervision and 24 hour management of active medical problems listed below. Physiatrist and rehab team continue to assess barriers to discharge/monitor patient progress toward functional and medical goals. FIM: FIM - Bathing Bathing Steps Patient Completed: Chest;Right Arm;Left Arm;Abdomen;Right upper leg;Left upper leg;Right lower leg (including foot);Left lower leg (including foot) Bathing: 4: Min-Patient completes 8-9 19f 10 parts or 75+ percent  FIM - Upper Body Dressing/Undressing Upper body dressing/undressing steps patient completed: Thread/unthread right sleeve of pullover shirt/dresss;Thread/unthread left sleeve of pullover shirt/dress;Pull shirt over trunk Upper body dressing/undressing: 4: Min-Patient completed 75 plus % of tasks FIM - Lower Body Dressing/Undressing Lower body dressing/undressing steps patient completed: Thread/unthread right pants leg;Thread/unthread left pants leg;Pull pants up/down;Don/Doff right sock;Don/Doff left sock;Don/Doff right shoe;Don/Doff left shoe Lower body dressing/undressing: 5: Supervision: Safety issues/verbal cues  FIM - Toileting Toileting: 0: Activity did not occur  FIM - Diplomatic Services operational officer Devices: Bedside commode (drop arm) Toilet Transfers: 0-Activity did not occur  FIM - Banker Devices: Arm rests Bed/Chair Transfer: 4: Chair or W/C > Bed: Min A (steadying Pt. > 75%);5: Sit > Supine: Supervision (verbal cues/safety issues)  FIM - Locomotion: Wheelchair Distance: 150 Locomotion: Wheelchair: 5: Travels 150 ft or more: maneuvers on rugs and over door sills with  supervision, cueing or coaxing FIM - Locomotion: Ambulation Locomotion: Ambulation Assistive Devices: Parallel bars Ambulation/Gait Assistance: 3: Mod assist Locomotion: Ambulation: 1: Travels less than 50 ft with moderate assistance (Pt: 50 - 74%)  Comprehension Comprehension Mode: Auditory Comprehension: 6-Follows complex conversation/direction: With extra time/assistive device  Expression Expression Mode: Verbal Expression: 6-Expresses complex ideas: With extra time/assistive device  Social Interaction Social Interaction: 6-Interacts appropriately with others with medication or extra time (anti-anxiety, antidepressant).  Problem Solving Problem Solving: 5-Solves complex 90% of the time/cues < 10% of the time  Memory Memory: 6-More than reasonable amt of time  Medical Problem List and Plan:  1. 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  2. Pain Management: Cervical collar prn for comfort.   3. Mood: Seems to have a good outlook. No active issues at present.  4. Neuropsych: This patient is capable of making decisions on his/her own behalf.  5. Disseminated MRSA: S/P evacuation of abscess and minithorocotomy Vancomycin 6 weeks post-op from (1/23)  6. Anemia due to infection/crutical illness: Added iron supplement.  7. Hyponatremia: stable/improved--recheck monday 8. Abnormal LFTs: Likely due to sepsis. No GI symptoms. Stable to improved today 9. Neurogenic bowel/bladder- now having bladder spasms  -dc'ed suppository, working on timed bowel movements also  -probiotic  added  -may have prostate hypertrophy  As well.  -foley replaced. flomax  -ditropan seems to be helping  -ua positive--await culture given that he's already on abx  -voiding trial later this week?  10. Wound care: wet to dry to chest wound-filling in nicely  -continue santyl to sacral wound daily-this is closing also  -pt is directing pressure relief, turning   .   LOS (Days) 15 A FACE TO FACE EVALUATION  WAS PERFORMED  Frank Brock T 03/11/2012 8:11 AM

## 2012-03-11 NOTE — Patient Care Conference (Signed)
Inpatient RehabilitationTeam Conference and Plan of Care Update Date: 03/11/2012   Time: 2:00 PM    Patient Name: Frank Brock      Medical Record Number: [id number]  Date of Birth: Apr 27, 1960 Sex: Male         Room/Bed: 4035/4035-01 Payor Info: Payor: [employer] MEDICARE  Plan: [employer] MEDICARE HMO/PPO  Product Type: *No Product type*     Admitting Diagnosis: CORD COMPRESSION WITH QUADRAPLEGIA  Admit Date/Time:  02/25/2012  5:31 PM Admission Comments: No comment available   Primary Diagnosis:  Quadriplegia Principal Problem: Quadriplegia  Patient Active Problem List   Diagnosis Date Noted  . Sepsis 02/19/2012  . 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 Hypokalemia 02/19/2012  . Anemia 02/19/2012  . Acute hyperglycemia 02/19/2012  . DVT of upper extremity (deep vein thrombosis) 02/14/2012  . Quadriplegia 02/14/2012  . Bacteremia due to methicillin resistant Staphylococcus aureus 02/11/2012  . S/P VATS (1/19) 02/11/2012  . Chest wall abscess 02/10/2012  . Lung abscess 02/10/2012  . Acute respiratory failure with hypoxia 02/08/2012  . Hypoxemia 02/08/2012  . H/O HTN 02/06/2012  . H/O hypercholesteremia 02/06/2012  . H/O stroke 02/06/2012    Expected Discharge Date: Expected Discharge Date: 03/21/12  Team Members Present: Physician leading conference: Dr. Faith Rogue Social Worker Present: Amada Jupiter, LCSW Nurse Present: Other (comment) Kennon Portela, RN) PT Present: [name], PT OT Present: Mackie Pai, OT Other (Discipline and Name): Tora Duck, PPS Coordinator     Current Status/Progress Goal Weekly Team Focus  Medical   improving strength, having urine retention and spasm (?uti), skin improving  see prior, increase bladder continence  rx uti, foley in--voiding retrial?   Bowel/Bladder   Incontinent of bowel. LBM 217/14. Foley to straight drain with orange urine r/t medicaiton  Managed bowel and bladder program  Continue to educate pt regarding timed toileting    Swallow/Nutrition/ Hydration             ADL's   supervision for basic w/c transfers, min assist ->supervision for bed mobility, supervision LB dressing in supine position,   overall supervision -> min assist exept mod assist for shower transfers  ADL retraining, functional use of bilateral UEs, OOB tolerance, activity tolerance/endurance, ADL transfers   Mobility   mod A transfers, mod A standing balance in parallel bars   S transfers, mod I w/c mobiltiy, mod A short distance gait  activity tolerance, OOB tolerance, pain management, standing and gait training   Communication             Safety/Cognition/ Behavioral Observations            Pain   Oxy IR 10mg  q 4hrs prn, Robaxin 500mg  q 6hrs prn, Tramadol 100mg  q 6hrs  <4  Offer pain medication 1 hr pr to intial therapy. Discuss other therapeutic technique   Skin   Stage 2 pressure ulcer with allevyn dressing,   No addiional skin breakdwown  Remind pt to turn q 2hrs      *See Interdisciplinary Assessment and Plan and progress notes for long and short-term goals  Barriers to Discharge: see prior    Possible Resolutions to Barriers:  family and patient education    Discharge Planning/Teaching Needs:  Pt to return home with mother and step-father providing needed assistance (also other local family to assist as well)      Team Discussion:  Foley replaced. ?UTI. Hope to do another voiding trial at end of week.  Starting with some ambulation today and hope  to be in regular w/c by end of week.  On track to meet supervision w/c goals.  No concerns.    Revisions to Treatment Plan:  None at this time.   Continued Need for Acute Rehabilitation Level of Care: The patient requires daily medical management by a physician with specialized training in physical medicine and rehabilitation for the following conditions: Daily direction of a multidisciplinary physical rehabilitation program to ensure safe treatment while eliciting the highest  outcome that is of practical value to the patient.: Yes Daily analysis of laboratory values and/or radiology reports with any subsequent need for medication adjustment of medical intervention for : Neurological problems (skin care, uti, neurogenic bladder and bowel)  Yussuf Sawyers 03/11/2012, 2:34 PM

## 2012-03-12 ENCOUNTER — Inpatient Hospital Stay (HOSPITAL_COMMUNITY): Payer: Medicare HMO | Admitting: Occupational Therapy

## 2012-03-12 ENCOUNTER — Inpatient Hospital Stay (HOSPITAL_COMMUNITY): Payer: Medicare HMO

## 2012-03-12 DIAGNOSIS — G061 Intraspinal abscess and granuloma: Secondary | ICD-10-CM

## 2012-03-12 DIAGNOSIS — J9600 Acute respiratory failure, unspecified whether with hypoxia or hypercapnia: Secondary | ICD-10-CM

## 2012-03-12 DIAGNOSIS — G8250 Quadriplegia, unspecified: Secondary | ICD-10-CM

## 2012-03-12 DIAGNOSIS — L89109 Pressure ulcer of unspecified part of back, unspecified stage: Secondary | ICD-10-CM

## 2012-03-12 LAB — CBC
HCT: 31.4 % — ABNORMAL LOW (ref 39.0–52.0)
Hemoglobin: 10.2 g/dL — ABNORMAL LOW (ref 13.0–17.0)
MCH: 28.4 pg (ref 26.0–34.0)
MCHC: 32.5 g/dL (ref 30.0–36.0)
RDW: 13.9 % (ref 11.5–15.5)

## 2012-03-12 NOTE — Progress Notes (Signed)
Subjective/Complaints: Had a good night. Getting stronger. Did stairs yesterday!. A 12 point review of systems has been performed and if not noted above is otherwise negative.   Objective: Vital Signs: Blood pressure 124/71, pulse 74, temperature 98.2 F (36.8 C), temperature source Oral, resp. rate 18, weight 60.1 kg (132 lb 7.9 oz), SpO2 96.00%. No results found.  Recent Labs  03/09/12 0914 03/12/12 0635  WBC 9.9 8.0  HGB 10.4* 10.2*  HCT 31.4* 31.4*  PLT 333 373    Recent Labs  03/10/12 1603  NA 133*  K 4.6  CL 100  GLUCOSE 107*  BUN 19  CREATININE 0.90  CALCIUM 9.3   CBG (last 3)  No results found for this basename: GLUCAP,  in the last 72 hours  Wt Readings from Last 3 Encounters:  03/12/12 60.1 kg (132 lb 7.9 oz)  02/25/12 76.2 kg (167 lb 15.9 oz)  02/25/12 76.2 kg (167 lb 15.9 oz)    Physical Exam:  Constitutional: He is oriented to person, place, and time. He appears well-developed. He has a sickly appearance.  Cardiovascular: Normal rate and regular rhythm.  Pulmonary/Chest: Effort normal. He has rales in the left middle field.  Abdominal: Soft. Bowel sounds are normal.  Musculoskeletal: He exhibits no tenderness.  Minimal edema left forearm. Bilateral heels boggy--non tender.  Neurological: He is alert and oriented to person, place, and time. A bit distracted but generally appropriate  decreased sensation rectally. Decreased sensation to LT in both hands and feet.  DTR's 1+, no resting tone. Good voice/phonation, cognition is generally intact.  MOTOR TESTING:  Muscle Right Left  Deltoid 3+ 3 + Triceps 4  4 Biceps 4  4 Wrist ext 4 4 HI 3 3  HF 3+ 3+  KE 3+ 3+ ADF 4 4  APF 4 4  Skin:  Posterior neck incision clean and dry with steri strips in place. Lower chest wall with sutures anteriorly. Upper left chest wall with VAC in place and sealed. Sacrum with small area fibronecrotic tissue near sacrum which really hasn't changed over the last few  days.   Assessment/Plan: 1. 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 Pt with central cord clinical picture. He is s/p surgical decompression which require 3+ hours per day of interdisciplinary therapy in a comprehensive inpatient rehab setting. Physiatrist is providing close team supervision and 24 hour management of active medical problems listed below. Physiatrist and rehab team continue to assess barriers to discharge/monitor patient progress toward functional and medical goals. FIM: FIM - Bathing Bathing Steps Patient Completed: Chest;Right Arm;Left Arm;Abdomen;Right upper leg;Left upper leg Bathing: 4: Min-Patient completes 8-9 81f 10 parts or 75+ percent  FIM - Upper Body Dressing/Undressing Upper body dressing/undressing steps patient completed: Thread/unthread right sleeve of pullover shirt/dresss;Thread/unthread left sleeve of pullover shirt/dress Upper body dressing/undressing: 4: Min-Patient completed 75 plus % of tasks FIM - Lower Body Dressing/Undressing Lower body dressing/undressing steps patient completed: Thread/unthread right pants leg;Thread/unthread left pants leg;Pull pants up/down;Don/Doff right sock;Don/Doff left sock;Don/Doff right shoe Lower body dressing/undressing: 4: Min-Patient completed 75 plus % of tasks  FIM - Toileting Toileting: 0: Activity did not occur  FIM - Diplomatic Services operational officer Devices: Bedside commode (drop arm) Toilet Transfers: 0-Activity did not occur  FIM - Banker Devices: Arm rests;Bed rails Bed/Chair Transfer: 5: Supine > Sit: Supervision (verbal cues/safety issues);4: Bed > Chair or W/C: Min A (steadying Pt. > 75%);4: Chair  or W/C > Bed: Min A (steadying Pt. > 75%)  FIM - Locomotion: Wheelchair Distance: 150 Locomotion: Wheelchair: 5: Travels 150 ft or more: maneuvers on rugs and over door  sills with supervision, cueing or coaxing FIM - Locomotion: Ambulation Locomotion: Ambulation Assistive Devices: Parallel bars Ambulation/Gait Assistance: 3: Mod assist Locomotion: Ambulation: 1: Travels less than 50 ft with moderate assistance (Pt: 50 - 74%)  Comprehension Comprehension Mode: Auditory Comprehension: 6-Follows complex conversation/direction: With extra time/assistive device  Expression Expression Mode: Verbal Expression: 6-Expresses complex ideas: With extra time/assistive device  Social Interaction Social Interaction: 6-Interacts appropriately with others with medication or extra time (anti-anxiety, antidepressant).  Problem Solving Problem Solving: 5-Solves basic problems: With no assist  Memory Memory: 6-More than reasonable amt of time  Medical Problem List and Plan:  1. 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  2. Pain Management: Cervical collar prn for comfort.   3. Mood: Seems to have a good outlook. No active issues at present.  4. Neuropsych: This patient is capable of making decisions on his/her own behalf.  5. Disseminated MRSA: S/P evacuation of abscess and minithorocotomy Vancomycin 6 weeks post-op from (1/23)  6. Anemia due to infection/crutical illness: Added iron supplement.  7. Hyponatremia: stable/improved--recheck monday 8. Abnormal LFTs: Likely due to sepsis. No GI symptoms. Stable to improved today 9. Neurogenic bowel/bladder- now having bladder spasms  -dc'ed suppository, working on timed bowel movements also. Better sense of emptying  -probiotic  added  -may have prostate hypertrophy  As well.  -foley replaced. flomax  -ditropan seems to be helping  -urine culture negative  -voiding trial Friday?  10. Wound care: wet to dry to chest wound-filling in nicely  -continue santyl to sacral wound daily-this is slowly improving  -pt is directing pressure relief, turning   .   LOS (Days) 16 A FACE TO FACE EVALUATION WAS  PERFORMED  Aerin Delany T 03/12/2012 8:30 AM

## 2012-03-12 NOTE — Progress Notes (Signed)
Physical Therapy Weekly Progress Note  Patient Details  Name: Frank Brock MRN: [id number] Date of Birth: 02/02/1960  Today's Date: 03/12/2012  Patient has met 4 of 4 short term goals. Pt is continuing to make great gains with mobility. Have begun more standing activities and gait with RW. D/c still on plan for 2/28 with plan for family education.  Patient continues to demonstrate the following deficits: decreased activity tolerance, impaired balance, decreased strength, ataxia, and therefore will continue to benefit from skilled PT intervention to enhance overall performance with activity tolerance, balance, postural control, ability to compensate for deficits, functional use of  right upper extremity, right lower extremity, left upper extremity and left lower extremity and coordination.  Patient progressing toward long term goals..  Continue plan of care.  PT Short Term Goals Week 2:  PT Short Term Goal 1 (Week 2): Pt will be able to perform bed mobility with min A PT Short Term Goal 1 - Progress (Week 2): Met PT Short Term Goal 2 (Week 2): Pt will be able to transfer with min A PT Short Term Goal 2 - Progress (Week 2): Met PT Short Term Goal 3 (Week 2): Pt will be able to propel w/c mod I on unit PT Short Term Goal 3 - Progress (Week 2): Met PT Short Term Goal 4 (Week 2): Pt will demonstrate dynamic standing balance for pre-gait activities with max A PT Short Term Goal 4 - Progress (Week 2): Met Week 3:  PT Short Term Goal 1 (Week 3): = LTGs  Skilled Therapeutic Interventions/Progress Updates:  Ambulation/gait training;Balance/vestibular training;Community reintegration;Functional electrical stimulation;DME/adaptive equipment instruction;Disease management/prevention;Discharge planning;Functional mobility training;Neuromuscular re-education;Pain management;Patient/family education;Stair training;Psychosocial support;Skin care/wound management;Splinting/orthotics;UE/LE Strength  taining/ROM;Therapeutic Exercise;Therapeutic Activities;Wheelchair propulsion/positioning;UE/LE Coordination activities   Therapy Documentation Precautions:  Precautions Precautions: Cervical;Fall Required Braces or Orthoses: Cervical Brace Cervical Brace: Applied in sitting position Restrictions Weight Bearing Restrictions: No     Locomotion : Ambulation Ambulation/Gait Assistance: 3: Mod assist   See FIM for current functional status    [name] [facility] 03/12/2012, 2:07 PM

## 2012-03-12 NOTE — Progress Notes (Signed)
Occupational Therapy Session Notes  Patient Details  Name: Frank Brock MRN: [id number] Date of Birth: April 14, 1960  Today's Date: 03/12/2012  Short Term Goals: Week 1:  OT Short Term Goal 1 (Week 1): Patient will complete UB dressing sitting edge of bed with moderate assistance OT Short Term Goal 1 - Progress (Week 1): Met OT Short Term Goal 2 (Week 1): Patient will complete LB dressing in supine position with maximal assistance OT Short Term Goal 2 - Progress (Week 1): Met OT Short Term Goal 3 (Week 1): Patient will perform grooming tasks seated at sink with miminal assistance OT Short Term Goal 3 - Progress (Week 1): Met OT Short Term Goal 4 (Week 1): BSC will be introduced to patient for bowel program with nursing OT Short Term Goal 4 - Progress (Week 1): Met  Week 2:  OT Short Term Goal 1 (Week 2): Patient will donn shirt seated edge of bed with minimal assistance OT Short Term Goal 2 (Week 2): Patient will complete LB dressing with moderate assistance at bed level OT Short Term Goal 3 (Week 2): Patient will be introduced to a HEP for BUEs OT Short Term Goal 4 (Week 2): Patient will perform toilet transfers with moderate assistance  Skilled Therapeutic Interventions/Progress Updates:   Session #1 [phone] - 55 Minutes Individual Therapy Patient with 7/10 pain in sacrum, patient recently had medication Patient found supine in bed. Patient engaged in bed mobility in order to perform LB bathing & dressing in supine position. Patient then sat edge of bed with supervision and transferred edge of bed -> w/c with supervision (performing stand pivot). Patient then sat at sink in w/c for UB bathing & dressing and grooming tasks. During session also discussed d/c planning, importance of family education prior to discharge, and DME. Also focused skilled intervention on overall activity tolerance/endurance, functional use of bilateral UEs, and overall strength. Therapist also put together a  regular wheelchair, like the one patient will use at home. Plan to engage patient in transfers into the w/c and w/c mobility in later sessions. At end of session left patient supine in bed with call bell & phone within reach.   Session #2 [phone] - 40 Minutes Individual Therapy No complaints of pain Patient found supine in bed. Engaged in bed mobility and patient transferred into regular w/c with steady assist during stand pivot transfer without an assistive device. Patient then propelled self from room -> ADL apartment at mod I level. Once in ADL apartment patient transferred on/off elevated toilet seat with steady assist using grab bars prn and performing stand pivot transfers. Patient then propelled self into kitchen and worked on sit/stands and dynamic standing balance/tolerance/endurance. Patient propelled self back to room and transferred into bed with steady assist (stand pivot). Left patient supine in bed with call bell & phone within reach.    Session #3 [id number] - 25 Minutes Individual Therapy No complaints of pain Patient found supine in bed. Patient engaged in supine->sit with supervision then used rolling walker for edge of bed -> w/c stand pivot transfer with steady assist. Patient then propelled self throughout hallway. Focused skilled intervention on w/c mobility throughout hospital and outside on uneven surfaces as well as overall activity tolerance/endurance and functional use of bilateral hands/UEs. At end of session assisted patient back to bed with steady assist and left supine in bed with call bell & phone within reach.   Precautions:  Precautions Precautions: Cervical;Fall Required Braces or Orthoses: Cervical Brace Cervical Brace:  Applied in sitting position Restrictions Weight Bearing Restrictions: No  See FIM for current functional status  Frank Brock 03/12/2012, 7:46 AM

## 2012-03-12 NOTE — Progress Notes (Signed)
Physical Therapy Session Note  Patient Details  Name: Frank Brock MRN: [id number] Date of Birth: [birth year]  Today's Date: 03/12/2012 Time: 3086-5784 Time Calculation (min): 54 min  Short Term Goals: Week 2:  PT Short Term Goal 1 (Week 2): Pt will be able to perform bed mobility with min A PT Short Term Goal 2 (Week 2): Pt will be able to transfer with min A PT Short Term Goal 3 (Week 2): Pt will be able to propel w/c mod I on unit PT Short Term Goal 4 (Week 2): Pt will demonstrate dynamic standing balance for pre-gait activities with max A  Skilled Therapeutic Interventions/Progress Updates:   Bed mobility and transfer OOB with squat pivot technique. Sat EOB to put on clean shirt and put on shoes with S. W/c propulsion (in standard w/c and no theraband to aid with grip) to therapy gym with rest breaks as needed with S for endurance and strengthening. Gait training with RW with min to mod A (2 episodes of LOB to R with mod A to recover), cues for widening BOS and posture. Nustep for functional strengthening and endurance x 9 min on level 5 with rest breaks throughout. Using RW complete stand step transfers with steady A to close S. Pt with incontinent BM; ended sessio a few min early to return to room with nurse tech present to aid with hygiene and changing brief in preparation for next therapy session.   Therapy Documentation Precautions:  Precautions Precautions: Cervical;Fall Required Braces or Orthoses: Cervical Brace Cervical Brace: Applied in sitting position Restrictions Weight Bearing Restrictions: No   Pain: Pain Assessment Pain Assessment: 0-10 Pain Score:   5 Pain Type: Acute pain Pain Location: Buttocks Pain Orientation: Right;Left Pain Descriptors: Sore Pain Frequency: Intermittent Pain Onset: Progressive Patients Stated Pain Goal: 2 Pain Intervention(s): Medication (See eMAR) Multiple Pain Sites: No  See FIM for current functional status  Therapy/Group:  Individual Therapy  [name] [facility] 03/12/2012, 2:00 PM

## 2012-03-12 NOTE — Progress Notes (Signed)
Lovenox and vancomycin per Rx  AC: subclavian DVT 1/23. Now on full lovenox. Considering Xarelto or warf. CBC stable, scr 0.9 (2/17). Lovenox 60 mg q12h, based on most recent weight (down from admission)  ID: MRSA bacteremia and diffuse spinal infxn on MRI, s/p I&D of chest wall abscess drainage, thoracotomy, and VAC placement with chest tube 1/19, debridement 1/20. Afebrile, WBC 9.9 (2/16)  2/17 urine>>neg final.  MRSA PCR 1/17 >> POS Resp 1/17 >> MRSA  Blood 1/17 >> MRSA  1/18 bcx x2>> negative 1/19 L chest abscess>> abundant MRSA  1/23 L neck abscess>> few MRSA  Vanc 1/17 >> (3/6) 1/21 VT = 13.5 (on 1gm q8h) 1/24 VT = 17.4 on 1250 q8h 1/27 VT = 17.7 on 1250mg  q8h 2/3 VT = 24.2 on 1250 mg q8h ->chg to q12h 2/9 VT = 14.8 (drawn 1.5 hr late) >> continue same dose 2/17 VT = 16.4 (drawn 1.5 hr late) >> slight accumulation from last week's trough but still within goal. No change.  Tamiflu 1/17 >> 1/19 Zosyn 1/17 >> 1/20 Gent 1/24 >> 1/27 (trough was therapeutic at 0.6) Vancomycin trough goal 15-20  Renal: SCr 0.9 (2/17) UOP 2.4 ml/kg/hr  Plan: 1. Cont lovenox to 60mg  sq q12h; monitor weight 2. Continue vanc 1250mg  iv q12h. Repeat vanc trough, and BMET on 02/24 and weekly thru treatment course unless otherwise clinically indicated 3. F/u plan for oral anticoagulation

## 2012-03-12 NOTE — Progress Notes (Signed)
Social Work Patient ID: Frank Brock, male   DOB: 1960-04-14, 52 y.o.   MRN: [id number]  Met yesterday with patient to review team conference.  Expressing frustration over issues with bladder the past few days and concerns that "... I lost some days (of therapy) because of it.  Don't get me wrong. I want to go home on the 28th but I feel I lost some time."  Explained that we will be conferencing again next week and will determine if still on track for original target date of 2/28 and that I did not hear any concerns from team members in this week's conference about the date.  Pt also worried about another voiding trial because he experienced so much pain from last trial.  Have encouraged him to speak with MD about his concerns.   Pt is aware and agrees that we want to begin family education with his mother next week - stressed she is welcome to attend therapies anytime.  Will follow up with her as well.  Tashia Leiterman, LCSW

## 2012-03-13 ENCOUNTER — Inpatient Hospital Stay (HOSPITAL_COMMUNITY): Payer: Medicare HMO | Admitting: Occupational Therapy

## 2012-03-13 ENCOUNTER — Inpatient Hospital Stay (HOSPITAL_COMMUNITY): Payer: Medicare HMO | Admitting: *Deleted

## 2012-03-13 ENCOUNTER — Inpatient Hospital Stay (HOSPITAL_COMMUNITY): Payer: Medicare HMO | Admitting: Physical Therapy

## 2012-03-13 MED ORDER — TAMSULOSIN HCL 0.4 MG PO CAPS
0.4000 mg | ORAL_CAPSULE | Freq: Two times a day (BID) | ORAL | Status: DC
  Administered 2012-03-13 – 2012-03-21 (×17): 0.4 mg via ORAL
  Filled 2012-03-13 (×21): qty 1

## 2012-03-13 NOTE — Progress Notes (Signed)
Subjective/Complaints: No new issues. Encouraged by improved bowel control. A 12 point review of systems has been performed and if not noted above is otherwise negative.   Objective: Vital Signs: Blood pressure 110/69, pulse 75, temperature 97.9 F (36.6 C), temperature source Oral, resp. rate 18, weight 60.3 kg (132 lb 15 oz), SpO2 96.00%. No results found.  Recent Labs  03/12/12 0635  WBC 8.0  HGB 10.2*  HCT 31.4*  PLT 373    Recent Labs  03/10/12 1603  NA 133*  K 4.6  CL 100  GLUCOSE 107*  BUN 19  CREATININE 0.90  CALCIUM 9.3   CBG (last 3)  No results found for this basename: GLUCAP,  in the last 72 hours  Wt Readings from Last 3 Encounters:  03/13/12 60.3 kg (132 lb 15 oz)  02/25/12 76.2 kg (167 lb 15.9 oz)  02/25/12 76.2 kg (167 lb 15.9 oz)    Physical Exam:  Constitutional: He is oriented to person, place, and time. He appears well-developed. He has a sickly appearance.  Cardiovascular: Normal rate and regular rhythm.  Pulmonary/Chest: Effort normal. He has rales in the left middle field.  Abdominal: Soft. Bowel sounds are normal.  Musculoskeletal: He exhibits no tenderness.  Minimal edema left forearm. Bilateral heels boggy--non tender.  Neurological: He is alert and oriented to person, place, and time. A bit distracted but generally appropriate  decreased sensation rectally. Decreased sensation to LT in both hands and feet.  DTR's 1+, no resting tone. Good voice/phonation, cognition is generally intact.  MOTOR TESTING:  Muscle Right Left  Deltoid 4   4 Triceps 4  4 Biceps 4  4 Wrist ext 4 4 HI 3+ 3+ HF 3+ 3+  KE 3+ 3+ ADF 4+ 4+  APF 4+ 4 + Skin:  Posterior neck incision clean and dry with steri strips in place. Lower chest wall with sutures anteriorly. Upper left chest wall with VAC in place and sealed. Sacrum with small area fibronecrotic tissue near sacrum which really hasn't changed over the last few days.   Assessment/Plan: 1. 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 Pt with central cord clinical picture. He is s/p surgical decompression which require 3+ hours per day of interdisciplinary therapy in a comprehensive inpatient rehab setting. Physiatrist is providing close team supervision and 24 hour management of active medical problems listed below. Physiatrist and rehab team continue to assess barriers to discharge/monitor patient progress toward functional and medical goals. FIM: FIM - Bathing Bathing Steps Patient Completed: Chest;Right Arm;Left Arm;Abdomen;Right upper leg;Left upper leg;Right lower leg (including foot);Left lower leg (including foot) Bathing: 4: Min-Patient completes 8-9 49f 10 parts or 75+ percent  FIM - Upper Body Dressing/Undressing Upper body dressing/undressing steps patient completed: Thread/unthread right sleeve of pullover shirt/dresss;Thread/unthread left sleeve of pullover shirt/dress;Pull shirt over trunk Upper body dressing/undressing: 4: Min-Patient completed 75 plus % of tasks FIM - Lower Body Dressing/Undressing Lower body dressing/undressing steps patient completed: Thread/unthread right pants leg;Thread/unthread left pants leg;Pull pants up/down;Don/Doff right sock;Don/Doff left sock;Don/Doff right shoe;Don/Doff left shoe Lower body dressing/undressing: 5: Supervision: Safety issues/verbal cues (at bed level)  FIM - Toileting Toileting: 0: Activity did not occur  FIM - Diplomatic Services operational officer Devices: Elevated toilet seat;Grab bars Toilet Transfers: 4-To toilet/BSC: Min A (steadying Pt. > 75%);4-From toilet/BSC: Min A (steadying Pt. > 75%)  FIM - Banker Devices: Walker;Bed rails Bed/Chair Transfer: 5: Supine > Sit:  Supervision (verbal cues/safety issues);5: Sit > Supine: Supervision (verbal cues/safety issues);4: Bed > Chair or W/C: Min A  (steadying Pt. > 75%);4: Chair or W/C > Bed: Min A (steadying Pt. > 75%)  FIM - Locomotion: Wheelchair Distance: 150 Locomotion: Wheelchair: 6: Travels 150 ft or more, turns around, maneuvers to table, bed or toilet, negotiates 3% grade: maneuvers on rugs and over door sills independently (on unit) FIM - Locomotion: Ambulation Locomotion: Ambulation Assistive Devices: Designer, industrial/product Ambulation/Gait Assistance: 3: Mod assist Locomotion: Ambulation: 1: Travels less than 50 ft with moderate assistance (Pt: 50 - 74%)  Comprehension Comprehension Mode: Auditory Comprehension: 6-Follows complex conversation/direction: With extra time/assistive device  Expression Expression Mode: Verbal Expression: 7-Expresses complex ideas: With no assist  Social Interaction Social Interaction: 6-Interacts appropriately with others with medication or extra time (anti-anxiety, antidepressant).  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  2. Pain Management: Cervical collar prn for comfort.   3. Mood: Seems to have a good outlook. No active issues at present.  4. Neuropsych: This patient is capable of making decisions on his/her own behalf.  5. Disseminated MRSA: S/P evacuation of abscess and minithorocotomy Vancomycin 6 weeks post-op from (1/23)  6. Anemia due to infection/crutical illness: Added iron supplement.  7. Hyponatremia: stable/improved--recheck monday 8. Abnormal LFTs: Likely due to sepsis. No GI symptoms. Stable to improved today 9. Neurogenic bowel/bladder- now having bladder spasms  -dc'ed suppository, working on timed bowel movements also. Better sense of emptying  -probiotic  added  -may have prostate hypertrophy  As well.  -foley replaced.   -increase flomax to bid  -ditropan seems to be helping  -urine culture  negative  -voiding trial Friday or Monday.   10. Wound care: wet to dry to chest wound-filling in nicely  -continue santyl to sacral wound daily  -pt is directing pressure relief, turning   .   LOS (Days) 17 A FACE TO FACE EVALUATION WAS PERFORMED  Sonal Dorwart T 03/13/2012 8:32 AM

## 2012-03-13 NOTE — Progress Notes (Signed)
Physical Therapy Session Note  Patient Details  Name: Frank Brock MRN: [id number] Date of Birth: 12-29-1960  Today's Date: 03/13/2012 Time: 1015-1100 Time Calculation (min): 45 min  Short Term Goals: Week 3:  PT Short Term Goal 1 (Week 3): = LTGs  Skilled Therapeutic Interventions/Progress Updates:    Patient received supine in bed. Patient performs squat pivot transfer bed>wheelchair with min assist. Patient performed wheelchair mobility room>gym. Patient performs squat pivot transfer wheelchair>NuStep with min assist. Patient exercised on NuStep Lvl 4 x12 minutes with B LE to increase strength and endurance. Patient requires rest break at 1 minute intervals. Patient performed squat pivot transfer NuStep>wheelchair with min assist.  Patient returned to room and left seated in wheelchair with seatbelt donned and all needs within reach.  Therapy Documentation Precautions:  Precautions Precautions: Cervical;Fall Required Braces or Orthoses: Cervical Brace Cervical Brace: Applied in sitting position Restrictions Weight Bearing Restrictions: No Pain: Pain Assessment Pain Assessment: No/denies pain Pain Score: 0-No pain Locomotion : Ambulation Ambulation/Gait Assistance: Not tested (comment) Wheelchair Mobility Wheelchair Mobility: Yes Wheelchair Assistance: 6: Modified independent (Device/Increase time) Occupational hygienist: Both upper extremities Wheelchair Parts Management: Supervision/cueing Distance: 175   See FIM for current functional status  Therapy/Group: Individual Therapy  Chipper Herb. Jayonna Meyering, PT, DPT  03/13/2012, 12:28 PM

## 2012-03-13 NOTE — Progress Notes (Addendum)
Physical Therapy Session Note  Patient Details  Name: Frank Brock MRN: [id number] Date of Birth: [birth year]  Today's Date: 03/13/2012 Time: 4098-1191 Time Calculation (min): 43 min  Short Term Goals: Week 1:  PT Short Term Goal 1 (Week 1): Pt will be able to demonstrate bed mobility with mod A PT Short Term Goal 1 - Progress (Week 1): Not met PT Short Term Goal 2 (Week 1): Pt will be able to transfer with mod A PT Short Term Goal 2 - Progress (Week 1): Met PT Short Term Goal 3 (Week 1): Pt will be able to demonstrate dynamic sitting balance with mod A during functional task PT Short Term Goal 3 - Progress (Week 1): Met PT Short Term Goal 4 (Week 1): Pt will be able to propel w/c x 100' with S PT Short Term Goal 4 - Progress (Week 1): Met  Skilled Therapeutic Interventions/Progress Updates:    Stand pivot transfer with and without RW supervision progressing to min assist with fatigue. Ambulation x 10' with RW and up to moderate assist secondary to fatigue and catheter "tugging."  Attempted tall kneeling on mat, pt unable to obtain position. Standing balance activities with bil. UE unsupported, min assist. Pt requires encouragement with therapies. Wheelchair mobility to/from gym for strengthening and conditioning, supervision/modified indpendent. Discussed sitting up in wheelchair, pressure relief. Pt reports he likes staying in bed more, fearful of exacerbating wound. Agrees he will try slowly increase time up in chair, would like to try using a timer to remind him of pressure relief. Practiced wheelchair pushups which pt prefers over lateral leans. Pt fatigued from morning therapy sessions.   Therapy Documentation Precautions:  Precautions Precautions: Cervical;Fall Required Braces or Orthoses: Cervical Brace Cervical Brace: Applied in sitting position Restrictions Weight Bearing Restrictions: No Pain: Pain Assessment Pain Assessment:  (did not rate) Pain Location: Neck Pain  Orientation: Posterior Pain Descriptors: Aching Pain Onset: On-going (has progressed as medication worn off) Pain Intervention(s): RN made aware;Repositioned;Rest   See FIM for current functional status  Therapy/Group: Individual Therapy  Wilhemina Bonito 03/13/2012, 4:16 PM

## 2012-03-13 NOTE — Progress Notes (Signed)
Occupational Therapy Weekly Progress Note & Session Notes  Patient Details  Name: Frank Brock MRN: [id number] Date of Birth: 02/20/1960  Today's Date: 03/13/2012  WEEKLY PROGRESS NOTE Patient has met 4 of 4 short term goals.  Patient is making great progress on CIR. Patient is functioning at an overall min assist level, but currently requires total assist for toileting tasks of peri care and clothing management prior/post toileting. Patients upgraded goals set for overall supervision and estimated d/c date =2/28. Patient continues to be motivated to be independent and is good at problem solving through tasks to increase independence and increase overall safety regarding wounds and mobility.    Patient continues to demonstrate the following deficits: decreased activity tolerance/endurance, decreased independence with BADLs, decreased independence with IADL tasks, decreased independence with functional mobility, decreased independence with transfers, sit->stands, dynamic standing. Therefore, patient will continue to benefit from skilled OT intervention to enhance overall performance with BADL, iADL and Reduce care partner burden.  Patient exceeding set LTGs, upgrading goals..  See Care Plan/Paths for revisions  OT Short Term Goals Week 1:  OT Short Term Goal 1 (Week 1): Patient will complete UB dressing sitting edge of bed with moderate assistance OT Short Term Goal 1 - Progress (Week 1): Met OT Short Term Goal 2 (Week 1): Patient will complete LB dressing in supine position with maximal assistance OT Short Term Goal 2 - Progress (Week 1): Met OT Short Term Goal 3 (Week 1): Patient will perform grooming tasks seated at sink with miminal assistance OT Short Term Goal 3 - Progress (Week 1): Met OT Short Term Goal 4 (Week 1): BSC will be introduced to patient for bowel program with nursing OT Short Term Goal 4 - Progress (Week 1): Met  Week 2:  OT Short Term Goal 1 (Week 2): Patient will donn  shirt seated edge of bed with minimal assistance OT Short Term Goal 1 - Progress (Week 2): Met OT Short Term Goal 2 (Week 2): Patient will complete LB dressing with moderate assistance at bed level OT Short Term Goal 2 - Progress (Week 2): Met OT Short Term Goal 3 (Week 2): Patient will be introduced to a HEP for BUEs OT Short Term Goal 3 - Progress (Week 2): Met OT Short Term Goal 4 (Week 2): Patient will perform toilet transfers with moderate assistance OT Short Term Goal 4 - Progress (Week 2): Met  Week 3:  OT Short Term Goal 1 (Week 3): Short Term Goals = Long Term Goals  Skilled Therapeutic Interventions/Progress Updates:  Balance/vestibular training;Community reintegration;Discharge planning;Disease mangement/prevention;DME/adaptive equipment instruction;Functional mobility training;Neuromuscular re-education;Pain management;Patient/family education;Psychosocial support;Self Care/advanced ADL retraining;Skin care/wound managment;Splinting/orthotics;Therapeutic Activities;Therapeutic Exercise;UE/LE Strength taining/ROM;UE/LE Coordination activities;Wheelchair propulsion/positioning   Precautions:  Precautions Precautions: Cervical;Fall Required Braces or Orthoses: Cervical Brace Cervical Brace: Applied in sitting position Restrictions Weight Bearing Restrictions: No  See FIM for current functional status  -----------------------------------------------------------------------------------------------------------  SESSION NOTES  Session #1 1100-1200  - 60 Minutes Individual Therapy No complaints of pain Patient found seated in w/c. Patient sat at sink at beginning of session for shaving using electric razor, therapist had to assist with shaving at this time. Patient then transferred onto w/c <-> elevated toilet seat for toileting. Then patient transferred onto tub transfer bench for ADL retraining at shower level. Patient completed UB/LB bathing in sit->stand position in shower.  Therapist covered wound on chest and pic area on right arm for shower. Patient then performed UB/LB dressing at sink level and completed grooming tasks of brushing  teeth at sink. At end of session patient requested to get back to bed, assisted patient to bed and left supine in bed with call bell & phone within reach. Notified RN of needed dressing changes.    Session #2 4540-9811 - 45 Minutes Individual Therapy No complaints of pain Patient found supine in bed. Patient performed edge of bed -> w/c stand pivot transfer with close supervision. Patient then propelled self from room -> ADL apartment for tub/shower transfer on/off padded tub transfer bench. Patient then engaged in UE exercise using 2kg weighted ball and using SCIFIT machine in standing. Therapist propelled patient back to room and left him supine in bed per his request with call bell & phone within reach.   Yoni Lobos 03/13/2012, 11:50 AM

## 2012-03-14 ENCOUNTER — Inpatient Hospital Stay (HOSPITAL_COMMUNITY): Payer: Medicare HMO | Admitting: Physical Therapy

## 2012-03-14 ENCOUNTER — Inpatient Hospital Stay (HOSPITAL_COMMUNITY): Payer: Medicare HMO | Admitting: Occupational Therapy

## 2012-03-14 DIAGNOSIS — G061 Intraspinal abscess and granuloma: Secondary | ICD-10-CM

## 2012-03-14 DIAGNOSIS — G8250 Quadriplegia, unspecified: Secondary | ICD-10-CM

## 2012-03-14 DIAGNOSIS — N319 Neuromuscular dysfunction of bladder, unspecified: Secondary | ICD-10-CM

## 2012-03-14 DIAGNOSIS — J9600 Acute respiratory failure, unspecified whether with hypoxia or hypercapnia: Secondary | ICD-10-CM

## 2012-03-14 DIAGNOSIS — L89109 Pressure ulcer of unspecified part of back, unspecified stage: Secondary | ICD-10-CM

## 2012-03-14 MED ORDER — LIDOCAINE HCL 2 % EX GEL
CUTANEOUS | Status: DC | PRN
  Filled 2012-03-14: qty 5

## 2012-03-14 MED ORDER — LIDOCAINE HCL 2 % EX GEL
CUTANEOUS | Status: DC | PRN
  Filled 2012-03-14: qty 20

## 2012-03-14 NOTE — Progress Notes (Signed)
Patient ID: Frank Brock, male   DOB: 04/24/1960, 52 y.o.   MRN: [id number].progress                   [address]            [place] [address]          [phone]          LOS: 18 days   Subjective: Making excellent progress with strength since last seen  Objective: Vital signs in last 24 hours: Patient Vitals for the past 24 hrs:  BP Temp Temp src Pulse Resp SpO2 Weight  03/14/12 1521 136/67 mmHg 98.3 F (36.8 C) Oral 76 18 99 % -  03/14/12 0521 119/72 mmHg 97.6 F (36.4 C) Oral 79 18 96 % 132 lb 15 oz (60.3 kg)    Filed Weights   03/12/12 0658 03/13/12 0546 03/14/12 0521  Weight: 132 lb 7.9 oz (60.1 kg) 132 lb 15 oz (60.3 kg) 132 lb 15 oz (60.3 kg)    Hemodynamic parameters for last 24 hours:    Intake/Output from previous day: 02/20 0701 - 02/21 0700 In: 120 [P.O.:120] Out: 1700 [Urine:1700] Intake/Output this shift: Total I/O In: 480 [P.O.:480] Out: 625 [Urine:625]  Scheduled Meds: . alteplase  2 mg Intracatheter Once  . amLODipine  10 mg Oral Daily  . antiseptic oral rinse  1 application Mouth Rinse QID  . chlorhexidine  15 mL Mouth Rinse BID  . enoxaparin (LOVENOX) injection  60 mg Subcutaneous Q12H  . feeding supplement  1 Container Oral TID BM  . magic mouthwash  5 mL Oral QID  . protein supplement  1 scoop Oral TID WC  . saccharomyces boulardii  250 mg Oral BID  . Tamsulosin HCl  0.4 mg Oral BID  . vancomycin  1,250 mg Intravenous Q12H   Continuous Infusions:  PRN Meds:.acetaminophen, albuterol, alum & mag hydroxide-simeth, guaiFENesin-dextromethorphan, ipratropium, lidocaine, menthol-cetylpyridinium, methocarbamol, oxyCODONE, phenol, prochlorperazine, prochlorperazine, prochlorperazine, sodium chloride, traMADol, white petrolatum  Left upper chest wound is now almost completely healed without infection  Lab Results: CBC: Recent Labs  03/12/12 0635  WBC 8.0  HGB 10.2*  HCT 31.4*  PLT 373   BMET: No results found for  this basename: NA, K, CL, CO2, GLUCOSE, BUN, CREATININE, CALCIUM,  in the last 72 hours  PT/INR: No results found for this basename: LABPROT, INR,  in the last 72 hours   Radiology No results found.   Assessment/Plan: Patient reports he will go home in one week. Excellent care by rehab team, great progress since in ICU   Delight Ovens MD 03/14/2012 4:52 PM

## 2012-03-14 NOTE — Progress Notes (Signed)
Occupational Therapy Session Notes   Patient Details  Name: Frank Brock MRN: [id number] Date of Birth: 1960/09/17  Today's Date: 03/14/2012  Short Term Goals: Week 1:  OT Short Term Goal 1 (Week 1): Patient will complete UB dressing sitting edge of bed with moderate assistance OT Short Term Goal 1 - Progress (Week 1): Met OT Short Term Goal 2 (Week 1): Patient will complete LB dressing in supine position with maximal assistance OT Short Term Goal 2 - Progress (Week 1): Met OT Short Term Goal 3 (Week 1): Patient will perform grooming tasks seated at sink with miminal assistance OT Short Term Goal 3 - Progress (Week 1): Met OT Short Term Goal 4 (Week 1): BSC will be introduced to patient for bowel program with nursing OT Short Term Goal 4 - Progress (Week 1): Met  Week 2:  OT Short Term Goal 1 (Week 2): Patient will donn shirt seated edge of bed with minimal assistance OT Short Term Goal 1 - Progress (Week 2): Met OT Short Term Goal 2 (Week 2): Patient will complete LB dressing with moderate assistance at bed level OT Short Term Goal 2 - Progress (Week 2): Met OT Short Term Goal 3 (Week 2): Patient will be introduced to a HEP for BUEs OT Short Term Goal 3 - Progress (Week 2): Met OT Short Term Goal 4 (Week 2): Patient will perform toilet transfers with moderate assistance OT Short Term Goal 4 - Progress (Week 2): Met  Week 3:  OT Short Term Goal 1 (Week 3): Short Term Goals = Long Term Goals  Skilled Therapeutic Interventions/Progress Updates:   Session #1 1105-1200 - 55 Minutes Individual Therapy No complaints of pain Patient found seated in w/c. Patient performed shower stall transfer onto tub transfer bench and engaged in UB/LB bathing in sit->stand position. Patient transferred back to w/c after shower and sat at sink for grooming tasks and UB/LB dressing. Patient requested to get back to bed at end of session, patient performed stand pivot transfer from w/c -> edge of bed and  sit->supine with supervision. Focused skilled intervention on stand pivot transfers, functional use of bilateral UEs, overall activity tolerance/endurance, sit/stands, and dynamic standing balance/tolerance/endurance. Patient left supine in bed with call bell & phone within reach.   Session #2 4098-1191 - 40 Minutes Individual Therapy No complaints of pain Patient found seated in w/c. Patient propelled self from room -> ADL apartment and engaged in functional mobility/ambulation using rolling walker. Patient performed furniture transfer on/off couch and toilet transfer. Focused skilled intervention on functional mobility using rolling walker, safety with rolling walker, side stepping, transfers, and overall activity tolerance/endurance. Patient ambulated down hallway and sat in w/c to propel self back to room for bed transfer. Recommend patient ambulate to bathroom prn for toileting needs. Patient left supine in bed with call bell & phone within reach.   Precautions:  Precautions Precautions: Cervical;Fall Required Braces or Orthoses: Cervical Brace Cervical Brace: Applied in sitting position Restrictions Weight Bearing Restrictions: No  See FIM for current functional status  Frank Brock 03/14/2012, 7:58 AM

## 2012-03-14 NOTE — Progress Notes (Signed)
Physical Therapy Session Note  Patient Details  Name: Frank Brock MRN: [id number] Date of Birth: 1960-07-31  Today's Date: 03/14/2012 Time: 1000-1055 Time Calculation (min): 55 min  Short Term Goals: Week 1:  PT Short Term Goal 1 (Week 1): Pt will be able to demonstrate bed mobility with mod A PT Short Term Goal 1 - Progress (Week 1): Not met PT Short Term Goal 2 (Week 1): Pt will be able to transfer with mod A PT Short Term Goal 2 - Progress (Week 1): Met PT Short Term Goal 3 (Week 1): Pt will be able to demonstrate dynamic sitting balance with mod A during functional task PT Short Term Goal 3 - Progress (Week 1): Met PT Short Term Goal 4 (Week 1): Pt will be able to propel w/c x 100' with S PT Short Term Goal 4 - Progress (Week 1): Met  Skilled Therapeutic Interventions/Progress Updates:    Pt in bed upon arrival, able to perform bed mobility and perform stand-pivot transfer to chair with set-up and supervision however remains unsteady in standing. Wheelchair mobility to gym and back for strengthening and endurance, pt stops intermittently for rest.   Practiced standing balance without upper extremity support with balloon taps + cognitive challenge with varying bases of support (normal stance, narrow, modified tandem) pt with overall heavy min assist needed for balance. Performed heel cone taps (RW in front for support) to improve bil. Foot clearance, foot placement, and heel strike. PT providing manual facilitation of pelvic stabilizers and cues for modulated control. Gait 2 x 71' with RW, min/mod assist and verbal cues and manual facilitation for carry over from cone activity. Pt has most difficulty with turns however is improving. Extended rest breaks needed secondary to decreased endurance.  Pt does not like to use gloves at this time and requested theraband be applied to wheels of chair for improved grip, the bands were applied.   Therapy Documentation Precautions:   Precautions Precautions: Cervical;Fall Required Braces or Orthoses: Cervical Brace Cervical Brace: Applied in sitting position Restrictions Weight Bearing Restrictions: No Pain: Pain Assessment Pain Assessment: 0-10 Pain Score:   4 Faces Pain Scale: Hurts little more Pain Type: Chronic pain Pain Location: Buttocks Pain Orientation: Posterior Pain Descriptors: Aching Pain Onset: On-going Pain Intervention(s):  (pt had been premedicated, did not wish to call for more)    See FIM for current functional status  Therapy/Group: Individual Therapy  Wilhemina Bonito 03/14/2012, 12:04 PM

## 2012-03-14 NOTE — Progress Notes (Signed)
Physical Therapy Session Note  Patient Details  Name: Frank Brock MRN: [id number] Date of Birth: Apr 23, 1960  Today's Date: 03/14/2012 Time: 1478-2956 Time Calculation (min): 35 min   Skilled Therapeutic Interventions/Progress Updates:    Stand pivot transfers bed <> wheelchair performed with close supervision, no assistive device. Stand pivot transfers wheelchair <> NuStep performed with min assist secondary to balance. Cues needed for safety. Pt exercised on NuStep per request utilizing bil. LEs only x 10 min total ( with frequent rest breaks) on level 5, RPE 14. Wheelchair mobility on unit for strengthening and conditioning.   Therapy Documentation Precautions:  Precautions Precautions: Cervical;Fall Required Braces or Orthoses: Cervical Brace Cervical Brace: Applied in sitting position Restrictions Weight Bearing Restrictions: No General:   Vital Signs:   Pain: Pain Assessment Pain Assessment: 0-10 Pain Score:  (not rated) Faces Pain Scale: Hurts little more Pain Type: Chronic pain Pain Location: Buttocks Pain Orientation: Posterior Pain Descriptors: Aching Pain Onset: On-going Patients Stated Pain Goal: 2 Pain Intervention(s):  (premedicated) Multiple Pain Sites: Yes 2nd Pain Site Pain Score: 4 Pain Type: Acute pain Pain Location: Neck Patient's Stated Pain Goal: 2 Pain Intervention(s): Medication (See eMAR) Mobility:   Locomotion : Ambulation Ambulation/Gait Assistance: 3: Mod assist Wheelchair Mobility Distance: 200  Trunk/Postural Assessment :    Balance:   Exercises:   Other Treatments:    See FIM for current functional status  Therapy/Group: Individual Therapy  Wilhemina Bonito 03/14/2012, 1:42 PM

## 2012-03-14 NOTE — Plan of Care (Signed)
Problem: SCI BLADDER ELIMINATION Goal: RH STG MANAGE BLADDER WITH ASSISTANCE STG Manage Bladder With max Assistance  Outcome: Not Progressing Continue to intermittent cath pt prn retention; continue flomax and monitor PVRs  Problem: RH SKIN INTEGRITY Goal: RH STG MAINTAIN SKIN INTEGRITY WITH ASSISTANCE STG Maintain Skin Integrity With Max Assistance.  Outcome: Not Progressing Pt requires staff to apply dressing to buttocks and lft chest area  Problem: RH PAIN MANAGEMENT Goal: RH STG PAIN MANAGED AT OR BELOW PT'S PAIN GOAL Less or equal to 3  Outcome: Not Progressing Using oxy q4 hrs and robaxin/ultram for breakthrough pain

## 2012-03-14 NOTE — Progress Notes (Signed)
Subjective/Complaints: No new issues. Better control of bowels. Had shower A 12 point review of systems has been performed and if not noted above is otherwise negative.   Objective: Vital Signs: Blood pressure 119/72, pulse 79, temperature 97.6 F (36.4 C), temperature source Oral, resp. rate 18, weight 60.3 kg (132 lb 15 oz), SpO2 96.00%. No results found.  Recent Labs  03/12/12 0635  WBC 8.0  HGB 10.2*  HCT 31.4*  PLT 373   No results found for this basename: NA, K, CL, CO, GLUCOSE, BUN, CREATININE, CALCIUM,  in the last 72 hours CBG (last 3)  No results found for this basename: GLUCAP,  in the last 72 hours  Wt Readings from Last 3 Encounters:  03/14/12 60.3 kg (132 lb 15 oz)  02/25/12 76.2 kg (167 lb 15.9 oz)  02/25/12 76.2 kg (167 lb 15.9 oz)    Physical Exam:  Constitutional: He is oriented to person, place, and time. He appears well-developed. He has a sickly appearance.  Cardiovascular: Normal rate and regular rhythm.  Pulmonary/Chest: Effort normal. He has rales in the left middle field.  Abdominal: Soft. Bowel sounds are normal.  Musculoskeletal: He exhibits no tenderness.  Minimal edema left forearm. Bilateral heels boggy--non tender.  Neurological: He is alert and oriented to person, place, and time. A bit distracted but generally appropriate  decreased sensation rectally. Decreased sensation to LT in both hands and feet.  DTR's 1+, no resting tone. Good voice/phonation, cognition is generally intact.  MOTOR TESTING:  Muscle Right Left  Deltoid 4   4 Triceps 4  4 Biceps 4  4 Wrist ext 4 4 HI 3+ 3+ HF 3+ 3+  KE 3+ 3+ ADF 4+ 4+  APF 4+ 4 + Skin:  Posterior neck incision clean and dry with steri strips in place. Lower chest wall with sutures anteriorly. Upper left chest wall with VAC in place and sealed. Sacrum with small area fibronecrotic tissue near sacrum which really hasn't changed over the last few days.   Assessment/Plan: 1. 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 Pt with central cord clinical picture. He is s/p surgical decompression which require 3+ hours per day of interdisciplinary therapy in a comprehensive inpatient rehab setting. Physiatrist is providing close team supervision and 24 hour management of active medical problems listed below. Physiatrist and rehab team continue to assess barriers to discharge/monitor patient progress toward functional and medical goals. FIM: FIM - Bathing Bathing Steps Patient Completed: Chest;Right Arm;Left Arm;Abdomen;Front perineal area;Right upper leg;Left upper leg;Right lower leg (including foot);Left lower leg (including foot) Bathing: 4: Min-Patient completes 8-9 8f 10 parts or 75+ percent (and steady assist when standing)  FIM - Upper Body Dressing/Undressing Upper body dressing/undressing steps patient completed: Thread/unthread right sleeve of pullover shirt/dresss;Thread/unthread left sleeve of pullover shirt/dress;Pull shirt over trunk Upper body dressing/undressing: 4: Min-Patient completed 75 plus % of tasks FIM - Lower Body Dressing/Undressing Lower body dressing/undressing steps patient completed: Thread/unthread right pants leg;Thread/unthread left pants leg;Pull pants up/down;Don/Doff right sock;Don/Doff left sock;Don/Doff right shoe;Don/Doff left shoe Lower body dressing/undressing: 4: Steadying Assist (steady assist when standing, LB dsg completed in w/c)  FIM - Toileting Toileting: 1: Total-Patient completed zero steps, helper did all 3  FIM - Diplomatic Services operational officer Devices: Elevated toilet seat;Grab bars Toilet Transfers: 4-To toilet/BSC: Min A (steadying Pt. > 75%);4-From toilet/BSC: Min A (steadying Pt. > 75%)  FIM - Bed/Chair Transfer Bed/Chair Transfer Assistive Devices: Arm  rests;Bed rails Bed/Chair Transfer: 5: Supine > Sit: Supervision (verbal cues/safety  issues);5: Sit > Supine: Supervision (verbal cues/safety issues);4: Bed > Chair or W/C: Min A (steadying Pt. > 75%);4: Chair or W/C > Bed: Min A (steadying Pt. > 75%)  FIM - Locomotion: Wheelchair Distance: 175 Locomotion: Wheelchair: 2: Travels 50 - 149 ft with supervision, cueing or coaxing FIM - Locomotion: Ambulation Locomotion: Ambulation Assistive Devices: Designer, industrial/product Ambulation/Gait Assistance: 3: Mod assist Locomotion: Ambulation: 1: Travels less than 50 ft with moderate assistance (Pt: 50 - 74%)  Comprehension Comprehension Mode: Auditory Comprehension: 7-Follows complex conversation/direction: With no assist  Expression Expression Mode: Verbal Expression: 7-Expresses complex ideas: With no assist  Social Interaction Social Interaction: 7-Interacts appropriately with others - No medications needed.  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  2. Pain Management: Cervical collar prn for comfort.   3. Mood: Seems to have a good outlook. No active issues at present.  4. Neuropsych: This patient is capable of making decisions on his/her own behalf.  5. Disseminated MRSA: S/P evacuation of abscess and minithorocotomy Vancomycin 6 weeks post-op from (1/23)  6. Anemia due to infection/crutical illness: Added iron supplement.  7. Hyponatremia: stable/improved--recheck monday 8. Abnormal LFTs: Likely due to sepsis. No GI symptoms. Stable to improved today 9. Neurogenic bowel/bladder-   -bowel control much better  -probiotic  added  -may have prostate hypertrophy  As well.  -increased flomax to bid  -ditropan--dc given voiding trial  -urine culture negative  -voiding trial today. High pvr tolerance  -pt apprehensive about voiding trial, but it's time to try again  10. Wound care: wet to dry to  chest wound-filling in nicely  -continue santyl to sacral wound daily  -pt is directing pressure relief, turning   .   LOS (Days) 18 A FACE TO FACE EVALUATION WAS PERFORMED  Avan Gullett T 03/14/2012 7:20 AM

## 2012-03-15 ENCOUNTER — Inpatient Hospital Stay (HOSPITAL_COMMUNITY): Payer: Medicare HMO | Admitting: Physical Therapy

## 2012-03-15 LAB — BASIC METABOLIC PANEL
Calcium: 9.4 mg/dL (ref 8.4–10.5)
Creatinine, Ser: 0.71 mg/dL (ref 0.50–1.35)
GFR calc Af Amer: >90 mL/min (ref >90)
Sodium: 132 mEq/L — ABNORMAL LOW (ref 135–145)

## 2012-03-15 LAB — CBC
MCH: 28.5 pg (ref 26.0–34.0)
MCV: 86.3 fL (ref 78.0–100.0)
Platelets: 393 10*3/uL (ref 150–400)
RDW: 13.8 % (ref 11.5–15.5)
WBC: 9.3 10*3/uL (ref 4.0–10.5)

## 2012-03-15 MED ORDER — TRAZODONE HCL 50 MG PO TABS
50.0000 mg | ORAL_TABLET | Freq: Every day | ORAL | Status: DC
  Administered 2012-03-15 – 2012-03-20 (×6): 50 mg via ORAL
  Filled 2012-03-15 (×6): qty 1

## 2012-03-15 NOTE — Progress Notes (Signed)
Subjective/Complaints: Only voided small amounts. Had 1000cc late last night in bladder and unable to empty it. Foley replaced. He was able to shave himself yesterday! A 12 point review of systems has been performed and if not noted above is otherwise negative.   Objective: Vital Signs: Blood pressure 117/55, pulse 71, temperature 97.2 F (36.2 C), temperature source Oral, resp. rate 18, weight 60.3 kg (132 lb 15 oz), SpO2 99.00%. No results found. No results found for this basename: WBC, HGB, HCT, PLT,  in the last 72 hours No results found for this basename: NA, K, CL, CO, GLUCOSE, BUN, CREATININE, CALCIUM,  in the last 72 hours CBG (last 3)  No results found for this basename: GLUCAP,  in the last 72 hours  Wt Readings from Last 3 Encounters:  03/14/12 60.3 kg (132 lb 15 oz)  02/25/12 76.2 kg (167 lb 15.9 oz)  02/25/12 76.2 kg (167 lb 15.9 oz)    Physical Exam:  Constitutional: He is oriented to person, place, and time. He appears well-developed. He has a sickly appearance.  Cardiovascular: Normal rate and regular rhythm.  Pulmonary/Chest: Effort normal. He has rales in the left middle field.  Abdominal: Soft. Bowel sounds are normal.  Musculoskeletal: He exhibits no tenderness.  Minimal edema left forearm. Bilateral heels boggy--non tender.  Neurological: He is alert and oriented to person, place, and time. A bit distracted but generally appropriate  decreased sensation rectally. Decreased sensation to LT in both hands and feet.  DTR's 1+, no resting tone. Good voice/phonation, cognition is generally intact.  MOTOR TESTING:  Muscle Right Left  Deltoid 4   4 Triceps 4  4 Biceps 4  4 Wrist ext 4 4 HI 3+ 3+ HF 3+ 3+  KE 3+ 3+ ADF 4+ 4+  APF 4+ 4 + Skin:  Posterior neck incision clean and dry with steri strips in place. Lower chest wall with sutures anteriorly. Upper left chest wall with VAC in place and sealed. Sacrum with small area fibronecrotic tissue near sacrum which  really hasn't changed over the last few days.   Assessment/Plan: 1. 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 Pt with central cord clinical picture. He is s/p surgical decompression which require 3+ hours per day of interdisciplinary therapy in a comprehensive inpatient rehab setting. Physiatrist is providing close team supervision and 24 hour management of active medical problems listed below. Physiatrist and rehab team continue to assess barriers to discharge/monitor patient progress toward functional and medical goals. FIM: FIM - Bathing Bathing Steps Patient Completed: Chest;Right Arm;Left Arm;Abdomen;Front perineal area;Right upper leg;Left upper leg;Right lower leg (including foot);Left lower leg (including foot) Bathing: 4: Steadying assist  FIM - Upper Body Dressing/Undressing Upper body dressing/undressing steps patient completed: Thread/unthread right sleeve of pullover shirt/dresss;Thread/unthread left sleeve of pullover shirt/dress;Put head through opening of pull over shirt/dress;Pull shirt over trunk Upper body dressing/undressing: 5: Set-up assist to: Obtain clothing/put away FIM - Lower Body Dressing/Undressing Lower body dressing/undressing steps patient completed: Thread/unthread right pants leg;Thread/unthread left pants leg;Pull pants up/down;Don/Doff right sock;Don/Doff left sock;Don/Doff right shoe;Don/Doff left shoe Lower body dressing/undressing: 5: Supervision: Safety issues/verbal cues  FIM - Toileting Toileting: 0: Activity did not occur  FIM - Diplomatic Services operational officer Devices: Elevated toilet seat;Grab bars Toilet Transfers: 0-Activity did not occur  FIM - Banker Devices: Arm rests;Bed rails Bed/Chair Transfer: 5: Supine > Sit: Supervision (verbal cues/safety issues);4: Bed > Chair or  W/C: Min A (steadying Pt. > 75%);5:  Chair or W/C > Bed: Supervision (verbal cues/safety issues)  FIM - Locomotion: Wheelchair Distance: 200 Locomotion: Wheelchair: 6: Travels 150 ft or more, turns around, maneuvers to table, bed or toilet, negotiates 3% grade: maneuvers on rugs and over door sills independently FIM - Locomotion: Ambulation Locomotion: Ambulation Assistive Devices: Designer, industrial/product Ambulation/Gait Assistance: 3: Mod assist Locomotion: Ambulation: 2: Travels 50 - 149 ft with moderate assistance (Pt: 50 - 74%)  Comprehension Comprehension Mode: Auditory Comprehension: 7-Follows complex conversation/direction: With no assist  Expression Expression Mode: Verbal Expression: 7-Expresses complex ideas: With no assist  Social Interaction Social Interaction: 6-Interacts appropriately with others with medication or extra time (anti-anxiety, antidepressant).  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  2. Pain Management: Cervical collar prn for comfort.   3. Mood: Seems to have a good outlook. No active issues at present.  4. Neuropsych: This patient is capable of making decisions on his/her own behalf.  5. Disseminated MRSA: S/P evacuation of abscess and minithorocotomy Vancomycin 6 weeks post-op from (1/23)  6. Anemia due to infection/crutical illness: Added iron supplement.  7. Hyponatremia: stable/improved--recheck monday 8. Abnormal LFTs: Likely due to sepsis. No GI symptoms. Stable to improved today 9. Neurogenic bowel/bladder-   -bowel control much better  -probiotic  added  -may have prostate hypertrophy  As well.  -increased flomax to bid  -ditropan-can resume if he has any spasms  -given experience last night, foley replaced. He can follow with urology as an outpt  10. Wound care: wet to dry to chest wound-filling in  nicely  -continue santyl to sacral wound daily  -pt is directing pressure relief, turning   .   LOS (Days) 19 A FACE TO FACE EVALUATION WAS PERFORMED  Frank Brock T 03/15/2012 8:15 AM

## 2012-03-15 NOTE — Progress Notes (Signed)
Physical Therapy Session Note  Patient Details  Name: Frank Brock MRN: [id number] Date of Birth: 21-Nov-1960  Today's Date: 03/15/2012 Time: 10:00-10:45 Time Calculation: 45 minutes  Short Term Goals: Week 1:  PT Short Term Goal 1 (Week 1): Pt will be able to demonstrate bed mobility with mod A PT Short Term Goal 1 - Progress (Week 1): Not met PT Short Term Goal 2 (Week 1): Pt will be able to transfer with mod A PT Short Term Goal 2 - Progress (Week 1): Met PT Short Term Goal 3 (Week 1): Pt will be able to demonstrate dynamic sitting balance with mod A during functional task PT Short Term Goal 3 - Progress (Week 1): Met PT Short Term Goal 4 (Week 1): Pt will be able to propel w/c x 100' with S PT Short Term Goal 4 - Progress (Week 1): Met   Therapy Documentation Precautions:  Precautions: Cervical;Fall Required Braces or Orthoses: Cervical Brace Cervical Brace: Applied in sitting position Restrictions Weight Bearing Restrictions: No  Pain: Pain Score:   5 Pain Type: Acute pain Pain Location: Buttocks Pain Orientation: Mid Pain Descriptors: Sore Pain Onset: Gradual Pain Intervention(s): Medication (See eMAR)  Therapeutic Activity: (15') Transfer training sit<->stand S/Mod-I except for 1x when ambulating and turning to sit and lost balance with controlled descent onto mat table and requiring min-A Gait Training:(15') using RW 6 x 57' with S/Mod-I with verbal cues for wider BOS.  Patient is still having ataxia with gait. Therapeutic Exercise:(15') Nu-Step, Level 5 x 5' with multiple rests every minute   Therapy/Group: Individual Therapy  Rex Kras 03/15/2012, 5:17 PM

## 2012-03-15 NOTE — Progress Notes (Addendum)
ANTICOAGULATION CONSULT NOTE - Follow Up Consult  Pharmacy Consult for Lovenox and Vancomycin Indication: LUE DVT and spinal cord abscess  No Known Allergies  Patient Measurements: Weight: 132 lb 15 oz (60.3 kg) Heparin Dosing Weight:   Vital Signs: Temp: 97.2 F (36.2 C) (02/22 0627) Temp src: Oral (02/22 0627) BP: 117/55 mmHg (02/22 0627) Pulse Rate: 71 (02/22 0627)  Labs: No results found for this basename: HGB, HCT, PLT, APTT, LABPROT, INR, HEPARINUNFRC, CREATININE, CKTOTAL, CKMB, TROPONINI,  in the last 72 hours  The CrCl is unknown because both a height and weight (above a minimum accepted value) are required for this calculation.   Assessment: 52 yo M admitted to rehab 02/25/2012 after admission starting 1/7 for SOB confusion and left shoulder pain. Now s/p abscess evacuation (1/19) and spinal cord decompression (1/23). Patient noted to have LUE DVT on 1/22. Pharmacy consulted to dose 6 week course of vancomycin and lovenox.  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 Considering Xarelto or warf. CBC stable, scr 0.9 (2/17). Lovenox 60 mg q12h, based on most recent weight (down from admission). CBC perfectly stable.  ID: MRSA bacteremia and diffuse spinal infxn on MRI, s/p I&D of chest wall abscess drainage, thoracotomy, and VAC placement with chest tube 1/19, debridement 1/20. Afebrile, WBC 8 (2/17). Plan 6wks of ABX. Trough due Tuesday.  2/17 urine>>neg final.  MRSA PCR 1/17 >> POS Resp 1/17 >> MRSA  Blood 1/17 >> MRSA  1/18 bcx x2>> negative 1/19 L chest abscess>> abundant MRSA  1/23 L neck abscess>> few MRSA  Vanc 1/17 >> (3/6) 1/21 VT = 13.5 (on 1gm q8h) 1/24 VT = 17.4 on 1250 q8h 1/27 VT = 17.7 on 1250mg  q8h 2/3 VT = 24.2 on 1250 mg q8h ->chg to q12h 2/9 VT = 14.8 (drawn 1.5 hr late) >> continue same dose 2/17 VT = 16.4 (drawn 1.5 hr late) >> slight accumulation from last week's trough but still within goal. No change.  Tamiflu 1/17 >> 1/19 Zosyn 1/17 >>  1/20 Gent 1/24 >> 1/27 (trough was therapeutic at 0.6) Vancomycin trough goal 15-20   Goal of Therapy:  Vanco trough 15-20 Anti-Xa level 0.6-1.2 units/ml 4hrs after LMWH dose given Monitor platelets by anticoagulation protocol: Yes   Plan:  Lovenox 60mg  sq q12hrs Vancomycin 1250mg  q 12hrs. Trough due Tuesday.  Merilynn Finland, [employer] 03/15/2012,7:21 AM

## 2012-03-15 NOTE — Plan of Care (Signed)
Problem: SCI BLADDER ELIMINATION Goal: RH STG MANAGE BLADDER WITH ASSISTANCE STG Manage Bladder With max Assistance  Outcome: Not Progressing Coude cath reinserted due to urinary retention.

## 2012-03-16 ENCOUNTER — Inpatient Hospital Stay (HOSPITAL_COMMUNITY): Payer: Medicare HMO | Admitting: Occupational Therapy

## 2012-03-16 NOTE — Progress Notes (Signed)
Subjective/Complaints: Enjoyed grounds pass. No complaints. Slept better with trazodone  A 12 point review of systems has been performed and if not noted above is otherwise negative.   Objective: Vital Signs: Blood pressure 131/76, pulse 73, temperature 97.5 F (36.4 C), temperature source Oral, resp. rate 18, weight 60.3 kg (132 lb 15 oz), SpO2 98.00%. No results found.  Recent Labs  03/15/12 0749  WBC 9.3  HGB 10.2*  HCT 30.9*  PLT 393    Recent Labs  03/15/12 0911  NA 132*  K 3.9  CL 99  GLUCOSE 168*  BUN 13  CREATININE 0.71  CALCIUM 9.4   CBG (last 3)  No results found for this basename: GLUCAP,  in the last 72 hours  Wt Readings from Last 3 Encounters:  03/14/12 60.3 kg (132 lb 15 oz)  02/25/12 76.2 kg (167 lb 15.9 oz)  02/25/12 76.2 kg (167 lb 15.9 oz)    Physical Exam:  Constitutional: He is oriented to person, place, and time. He appears well-developed. He has a sickly appearance.  Cardiovascular: Normal rate and regular rhythm.  Pulmonary/Chest: Effort normal. He has rales in the left middle field.  Abdominal: Soft. Bowel sounds are normal.  Musculoskeletal: He exhibits no tenderness.  Minimal edema left forearm. Bilateral heels boggy--non tender.  Neurological: He is alert and oriented to person, place, and time. A bit distracted but generally appropriate  decreased sensation rectally. Decreased sensation to LT in both hands and feet.  DTR's 1+, no resting tone. Good voice/phonation, cognition is generally intact.  MOTOR TESTING:  Muscle Right Left  Deltoid 4   4 Triceps 4  4 Biceps 4  4 Wrist ext 4 4 HI 3+ 3+ HF 3+ 3+  KE 3+ 3+ ADF 4+ 4+  APF 4+ 4 + Skin:  Posterior neck incision clean and dry with steri strips in place. Lower chest wall with sutures anteriorly. Upper left chest wall with VAC in place and sealed. Sacrum with small area fibronecrotic tissue near sacrum which really hasn't changed over the last few  days.   Assessment/Plan: 1. 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 Pt with central cord clinical picture. He is s/p surgical decompression which require 3+ hours per day of interdisciplinary therapy in a comprehensive inpatient rehab setting. Physiatrist is providing close team supervision and 24 hour management of active medical problems listed below. Physiatrist and rehab team continue to assess barriers to discharge/monitor patient progress toward functional and medical goals. FIM: FIM - Bathing Bathing Steps Patient Completed: Chest;Right Arm;Left Arm;Abdomen;Front perineal area;Right upper leg;Left upper leg;Right lower leg (including foot);Left lower leg (including foot) Bathing: 4: Steadying assist  FIM - Upper Body Dressing/Undressing Upper body dressing/undressing steps patient completed: Thread/unthread right sleeve of pullover shirt/dresss;Thread/unthread left sleeve of pullover shirt/dress;Put head through opening of pull over shirt/dress;Pull shirt over trunk Upper body dressing/undressing: 5: Set-up assist to: Obtain clothing/put away FIM - Lower Body Dressing/Undressing Lower body dressing/undressing steps patient completed: Thread/unthread right pants leg;Thread/unthread left pants leg;Pull pants up/down;Don/Doff right sock;Don/Doff left sock;Don/Doff right shoe;Don/Doff left shoe Lower body dressing/undressing: 5: Supervision: Safety issues/verbal cues  FIM - Toileting Toileting: 0: Activity did not occur  FIM - Diplomatic Services operational officer Devices: Elevated toilet seat;Grab bars Toilet Transfers: 0-Activity did not occur  FIM - Banker Devices: Arm rests;Bed rails Bed/Chair Transfer: 5: Bed > Chair or W/C: Supervision (verbal cues/safety issues);5: Chair or W/C >  Bed: Supervision (verbal cues/safety issues)  FIM - Locomotion:  Wheelchair Distance: 200 Locomotion: Wheelchair: 6: Travels 150 ft or more, turns around, maneuvers to table, bed or toilet, negotiates 3% grade: maneuvers on rugs and over door sills independently FIM - Locomotion: Ambulation Locomotion: Ambulation Assistive Devices: Designer, industrial/product Ambulation/Gait Assistance: 3: Mod assist Locomotion: Ambulation: 2: Travels 50 - 149 ft with moderate assistance (Pt: 50 - 74%)  Comprehension Comprehension Mode: Auditory Comprehension: 7-Follows complex conversation/direction: With no assist  Expression Expression Mode: Verbal Expression: 7-Expresses complex ideas: With no assist  Social Interaction Social Interaction: 6-Interacts appropriately with others with medication or extra time (anti-anxiety, antidepressant).  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  2. Pain Management: Cervical collar prn for comfort.   3. Mood: Seems to have a good outlook. No active issues at present.  4. Neuropsych: This patient is capable of making decisions on his/her own behalf.  5. Disseminated MRSA: S/P evacuation of abscess and minithorocotomy Vancomycin 6 weeks post-op from (1/23)  6. Anemia due to infection/crutical illness: Added iron supplement.  7. Hyponatremia: stable/improved (132)--recheck prior to dc 8. Abnormal LFTs: Likely due to sepsis. No GI symptoms. Stable to improved today 9. Neurogenic bowel/bladder-   -bowel control much better  -probiotic  added  -may have prostate hypertrophy  As well.  -increased flomax to bid  -ditropan-can resume if he has any spasms  - foley replaced again. We will schedule with urology as an outpt  10. Wound care: wet to dry to chest wound  -continue santyl to sacral wound daily  -pt is directing pressure relief, turning 11. Insomnia:  trazodone   .   LOS (Days) 20 A FACE TO FACE EVALUATION WAS PERFORMED  Frank Brock T 03/16/2012 7:51 AM

## 2012-03-16 NOTE — Progress Notes (Signed)
Occupational Therapy Session Note  Patient Details  Name: Frank Brock MRN: [id number] Date of Birth: May 14, 1960  Today's Date: 03/16/2012 Time: 1345-1430 Time Calculation (min): 45 min  Short Term Goals: Week 3:  OT Short Term Goal 1 (Week 3): Short Term Goals = Long Term Goals  Skilled Therapeutic Interventions/Progress Updates:  Engaged in safe squat pivot and multiple stand step transfers using RW, propelled w/c to and from therapy gym, ambulated with RW 54', 87', and 120' with supervision to mod assist 2 times with LOB to the right.  Assisted patient to transfer onto commode with instructions to pull cord when assist is needed.  NT also notified that patient in bathroom and will need assist very soon.    Therapy Documentation Precautions:  Precautions Precautions: Cervical;Fall Required Braces or Orthoses: Cervical Brace Cervical Brace: Applied in sitting position Restrictions Weight Bearing Restrictions: No Pain: Denies pain initially then reports neck pain with activity, not rated, C-collar on, rest repositioned.  Therapy/Group: Individual Therapy  [name] 03/16/2012, 4:33 PM

## 2012-03-17 ENCOUNTER — Inpatient Hospital Stay (HOSPITAL_COMMUNITY): Payer: Medicare HMO | Admitting: Physical Therapy

## 2012-03-17 ENCOUNTER — Inpatient Hospital Stay (HOSPITAL_COMMUNITY): Payer: Medicare HMO | Admitting: Occupational Therapy

## 2012-03-17 DIAGNOSIS — L89109 Pressure ulcer of unspecified part of back, unspecified stage: Secondary | ICD-10-CM

## 2012-03-17 DIAGNOSIS — G061 Intraspinal abscess and granuloma: Secondary | ICD-10-CM

## 2012-03-17 DIAGNOSIS — N319 Neuromuscular dysfunction of bladder, unspecified: Secondary | ICD-10-CM

## 2012-03-17 DIAGNOSIS — J9600 Acute respiratory failure, unspecified whether with hypoxia or hypercapnia: Secondary | ICD-10-CM

## 2012-03-17 DIAGNOSIS — G8250 Quadriplegia, unspecified: Secondary | ICD-10-CM

## 2012-03-17 NOTE — Progress Notes (Signed)
Occupational Therapy Session Notes  Patient Details  Name: Frank Brock MRN: [id number] Date of Birth: [birth year]  Today's Date: 03/17/2012  Short Term Goals: Week 1:  OT Short Term Goal 1 (Week 1): Patient will complete UB dressing sitting edge of bed with moderate assistance OT Short Term Goal 1 - Progress (Week 1): Met OT Short Term Goal 2 (Week 1): Patient will complete LB dressing in supine position with maximal assistance OT Short Term Goal 2 - Progress (Week 1): Met OT Short Term Goal 3 (Week 1): Patient will perform grooming tasks seated at sink with miminal assistance OT Short Term Goal 3 - Progress (Week 1): Met OT Short Term Goal 4 (Week 1): BSC will be introduced to patient for bowel program with nursing OT Short Term Goal 4 - Progress (Week 1): Met  Week 2:  OT Short Term Goal 1 (Week 2): Patient will donn shirt seated edge of bed with minimal assistance OT Short Term Goal 1 - Progress (Week 2): Met OT Short Term Goal 2 (Week 2): Patient will complete LB dressing with moderate assistance at bed level OT Short Term Goal 2 - Progress (Week 2): Met OT Short Term Goal 3 (Week 2): Patient will be introduced to a HEP for BUEs OT Short Term Goal 3 - Progress (Week 2): Met OT Short Term Goal 4 (Week 2): Patient will perform toilet transfers with moderate assistance OT Short Term Goal 4 - Progress (Week 2): Met  Week 3:  OT Short Term Goal 1 (Week 3): Short Term Goals = Long Term Goals  Skilled Therapeutic Interventions/Progress Updates:   Session #1 0930-1000 - 30 Minutes Individual Therapy No complaints of pain Patient found supine in bed. Patient performed supine -> sit with supervision then edge of be -> w/c stand pivot transfer without RW with supervision. Patient then propelled self -> ADL apartment to donn brief in standing. Patient then propelled self -> therapy gym for UE exercise using SCIFIT machine in sitting for 5 minutes. Left patient in gym for next therapy  session.   Session #2 1105-1200 - 55 Minutes Individual Therapy No complaints of pain Patient found supine in bed "resting neck". Patient engaged in bed mobility with supervision then ambulated from edge of bed -> bathroom for ADL retraining at shower level. Therapist covered chest wound and pic line prior to shower. Patient performed UB/LB bathing in shower in sit-> stand position with distant supervision. Patient ambulated back to bed after shower for UB/LB dressing, then transferred into w/c for grooming tasks seated at sink. Focused skilled intervention on functional mobility/ambulation using rolling walker, functional use of bilateral UEs/hands, shower transfer using grab bars prn, UB/LB bathing & dressing, grooming tasks seated at sink, and overall activity tolerance/endurance. Patient transferred back to bed at end of session with call bell & phone within reach.   Precautions:  Precautions Precautions: Cervical;Fall Required Braces or Orthoses: Cervical Brace Cervical Brace: Applied in sitting position Restrictions Weight Bearing Restrictions: No  See FIM for current functional status  Frank Brock 03/17/2012, 7:32 AM

## 2012-03-17 NOTE — Plan of Care (Signed)
Problem: RH PAIN MANAGEMENT Goal: RH STG PAIN MANAGED AT OR BELOW PT'S PAIN GOAL Less or equal to 3  Outcome: Not Progressing Chronic pain.

## 2012-03-17 NOTE — Progress Notes (Addendum)
Subjective/Complaints: No new issues. Excited to be going home  A 12 point review of systems has been performed and if not noted above is otherwise negative.   Objective: Vital Signs: Blood pressure 122/49, pulse 67, temperature 97.8 F (36.6 C), temperature source Oral, resp. rate 16, weight 60.3 kg (132 lb 15 oz), SpO2 99.00%. No results found.  Recent Labs  03/15/12 0749  WBC 9.3  HGB 10.2*  HCT 30.9*  PLT 393    Recent Labs  03/15/12 0911  NA 132*  K 3.9  CL 99  GLUCOSE 168*  BUN 13  CREATININE 0.71  CALCIUM 9.4   CBG (last 3)  No results found for this basename: GLUCAP,  in the last 72 hours  Wt Readings from Last 3 Encounters:  03/14/12 60.3 kg (132 lb 15 oz)  02/25/12 76.2 kg (167 lb 15.9 oz)  02/25/12 76.2 kg (167 lb 15.9 oz)    Physical Exam:  Constitutional: He is oriented to person, place, and time. He appears well-developed. He has a sickly appearance.  Cardiovascular: Normal rate and regular rhythm.  Pulmonary/Chest: Effort normal. He has rales in the left middle field.  Abdominal: Soft. Bowel sounds are normal.  Musculoskeletal: He exhibits no tenderness.  Minimal edema left forearm. Bilateral heels boggy--non tender.  Neurological: He is alert and oriented to person, place, and time. A bit distracted but generally appropriate  decreased sensation rectally. Decreased sensation to LT in both hands and feet.  DTR's 1+, no resting tone. Good voice/phonation, cognition is generally intact.  MOTOR TESTING:  Muscle Right Left  Deltoid 4   4 Triceps 4  4 Biceps 4  4 Wrist ext 4 4 HI 3+ 3+ HF 3+ 3+  KE 3+ 3+ ADF 4+ 4+  APF 4+ 4 + Skin:  Posterior neck incision clean and dry with steri strips in place. Lower chest wall with sutures anteriorly. Upper left chest wall with VAC in place and sealed. Sacrum with small area fibronecrotic tissue near sacrum which has more fibronecrotic tissue at present.   Assessment/Plan: 1. 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 Pt with central cord clinical picture. He is s/p surgical decompression which require 3+ hours per day of interdisciplinary therapy in a comprehensive inpatient rehab setting. Physiatrist is providing close team supervision and 24 hour management of active medical problems listed below. Physiatrist and rehab team continue to assess barriers to discharge/monitor patient progress toward functional and medical goals. FIM: FIM - Bathing Bathing Steps Patient Completed: Chest;Right Arm;Left Arm;Abdomen;Front perineal area;Right upper leg;Left upper leg;Right lower leg (including foot);Left lower leg (including foot) Bathing: 4: Steadying assist  FIM - Upper Body Dressing/Undressing Upper body dressing/undressing steps patient completed: Thread/unthread right sleeve of pullover shirt/dresss;Thread/unthread left sleeve of pullover shirt/dress;Put head through opening of pull over shirt/dress;Pull shirt over trunk Upper body dressing/undressing: 5: Set-up assist to: Obtain clothing/put away FIM - Lower Body Dressing/Undressing Lower body dressing/undressing steps patient completed: Thread/unthread right pants leg;Thread/unthread left pants leg;Pull pants up/down;Don/Doff right sock;Don/Doff left sock;Don/Doff right shoe;Don/Doff left shoe Lower body dressing/undressing: 5: Supervision: Safety issues/verbal cues  FIM - Toileting Toileting: 0: Activity did not occur  FIM - Diplomatic Services operational officer Devices: Elevated toilet seat;Grab bars Toilet Transfers: 0-Activity did not occur  FIM - Banker Devices: Arm rests;Bed rails Bed/Chair Transfer: 5: Bed > Chair or W/C: Supervision (verbal cues/safety issues);5: Chair or W/C > Bed: Supervision (verbal  cues/safety issues)  FIM - Locomotion: Wheelchair Distance: 200 Locomotion: Wheelchair: 6:  Travels 150 ft or more, turns around, maneuvers to table, bed or toilet, negotiates 3% grade: maneuvers on rugs and over door sills independently FIM - Locomotion: Ambulation Locomotion: Ambulation Assistive Devices: Designer, industrial/product Ambulation/Gait Assistance: 3: Mod assist Locomotion: Ambulation: 2: Travels 50 - 149 ft with moderate assistance (Pt: 50 - 74%)  Comprehension Comprehension Mode: Auditory Comprehension: 7-Follows complex conversation/direction: With no assist  Expression Expression Mode: Verbal Expression: 7-Expresses complex ideas: With no assist  Social Interaction Social Interaction: 6-Interacts appropriately with others with medication or extra time (anti-anxiety, antidepressant).  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  2. Pain Management: Cervical collar prn for comfort.   3. Mood: Seems to have a good outlook. No active issues at present.  4. Neuropsych: This patient is capable of making decisions on his/her own behalf.  5. Disseminated MRSA: S/P evacuation of abscess and minithorocotomy Vancomycin 6 weeks post-op from (1/23)  6. Anemia due to infection/crutical illness: Added iron supplement.  7. Hyponatremia: stable/improved (132)--recheck prior to dc 8. Abnormal LFTs: Likely due to sepsis. No GI symptoms. Stable to improved today 9. Neurogenic bowel/bladder-   -bowel control much better  -probiotic  added  -may have prostate hypertrophy  As well.  -increased flomax to bid  -ditropan-can resume if he has any spasms  - foley replaced again. We will schedule with urology as an outpt  10. Wound care: wet to dry to chest woun--  -continue santyl/moist dressing to sacral wound daily- this doesn't adhere well to area given size,location  -would like for hydrotherapy to return to  address wound.  -pt is directing pressure relief, turning 11. Insomnia: trazodone   .   LOS (Days) 21 A FACE TO FACE EVALUATION WAS PERFORMED  Johnay Mano T 03/17/2012 8:10 AM

## 2012-03-17 NOTE — Progress Notes (Signed)
Physical Therapy Wound Evaluation Patient Details  Name: Frank Brock MRN: [id number] Date of Birth: 01-15-1961  Today's Date: 03/17/2012 Time: 0865-7846 Time Calculation (min): 27 min  Patient Active Problem List  Diagnosis  . H/O HTN  . H/O hypercholesteremia  . H/O stroke  . Acute respiratory failure with hypoxia  . Hypoxemia  . Chest wall abscess  . Lung abscess  . Bacteremia due to methicillin resistant Staphylococcus aureus  . S/P VATS (1/19)  . DVT of upper extremity (deep vein thrombosis)  . Quadriplegia  . Sepsis  . Paraspinal epidural abscess  . Hypokalemia  . Anemia  . Acute hyperglycemia   Past Medical History  Diagnosis Date  . Hypercholesteremia 02/06/2012  . Stroke 02/06/2012    09/13 secondary to hypertensive crisis.   Past Surgical History  Procedure Laterality Date  . Inguinal hernia repair    . Irrigation and debridement abscess  02/10/2012    Procedure: IRRIGATION AND DEBRIDEMENT ABSCESS;  Surgeon: Delight Ovens, MD;  Location: [facility] OR;  Service: Thoracic;  Laterality: Left;  . Thoracotomy  02/10/2012    Procedure: MINI/LIMITED THORACOTOMY;  Surgeon: Delight Ovens, MD;  Location: [facility] OR;  Service: Thoracic;  Laterality: Left;  . Chest exploration  02/11/2012    Procedure: CHEST EXPLORATION;  Surgeon: Delight Ovens, MD;  Location: [facility] OR;  Service: Thoracic;  Laterality: Left;  re-exploration of left chest wall; with wound vac change  . Minor application of wound vac  02/11/2012    Procedure: MINOR APPLICATION OF WOUND VAC;  Surgeon: Delight Ovens, MD;  Location: [facility] OR;  Service: Thoracic;  Laterality: Left;  wound vac change  . Posterior cervical laminectomy for epidural abscess  02/14/2012    Procedure: POSTERIOR CERVICAL LAMINECTOMY FOR EPIDURAL ABSCESS;  Surgeon: Maeola Harman, MD;  Location: [facility]UR[facility] ORS;  Service: Neurosurgery;  Laterality: N/A;  Cervical Laminectomy  for Epidural Abscess    Subjective  Subjective: Pt reports they have  been using hydrogel to wound  Patient and Family Stated Goals: heal wound Prior Treatments: santyl, hydrotherapy, hydrogel, allevyn  Pain Score:   Denied pain during treatment; states he has burning pain at times   Wound Assessment  Pressure Ulcer 02/13/12 Unstageable - Full thickness tissue loss in which the base of the ulcer is covered by slough (yellow, tan, gray, green or brown) and/or eschar (tan, brown or black) in the wound bed. 2cm x 3cm unstageable PU, pink edges with yello (Active)  State of Healing Eschar 03/17/2012  4:48 PM  Site / Wound Assessment Yellow 03/17/2012  4:48 PM  % Wound base Red or Granulating 0% 03/17/2012  4:48 PM  % Wound base Yellow 100% 03/17/2012  4:48 PM  % Wound base Other (Comment) 50% 03/14/2012 10:30 PM  Peri-wound Assessment Intact;Erythema (blanchable) 03/17/2012  4:48 PM  Wound Length (cm) 1.1 cm 03/17/2012  4:48 PM  Wound Width (cm) 0.9 cm 03/17/2012  4:48 PM  Wound Depth (cm) 0.1 cm 03/17/2012  4:48 PM  Margins Unattacted edges (unapproximated) 03/17/2012  4:48 PM  Drainage Amount Scant 03/17/2012  4:48 PM  Drainage Description Serous 03/17/2012  4:48 PM  Treatment Debridement (Selective);Hydrotherapy (Pulse lavage);Packing (Saline gauze) 03/17/2012  4:48 PM  Dressing Type Gauze (Comment);Moist to moist;Other (Comment) 03/17/2012  4:48 PM  Dressing Changed 03/17/2012  4:48 PM   Hydrotherapy Pulsed lavage therapy - wound location: sacrum Pulsed Lavage with Suction (psi): 12 psi Pulsed Lavage with Suction - Normal Saline Used: 1000 mL Pulsed Lavage  Tip: Tip with splash shield  Selective Debridement Selective Debridement - Location: sacrum Selective Debridement - Tools Used: Forceps;Scissors Selective Debridement - Tissue Removed: yellow eschar/slough   Wound Assessment and Plan  Wound Therapy - Assess/Plan/Recommendations Wound Therapy - Clinical Statement: 52 yo with complicated, lengthy hospitalization including pressure ulcer to sacrum. Wound has been  slow to heal due to continued yellow eschar. Spoke with Marissa Nestle, PA re: reorder for hydrotherapy due to persistent eschar. Pt reports to d/c home on Fri 2/28. Agree with short trial of hydrotherapy and debridement to progress wound through phases of healing. On d/c home, anticipate wound would do well with a hydrocolloid dressing to allow autolytic debridement and minimize dressing changes (to minimize disturbing wound bed and further delay of healing). Wound Therapy - Functional Problem List: decr skin integrity with incr risk of infection and limiting sitting tolerance Factors Delaying/Impairing Wound Healing: Altered sensation;Incontinence;Immobility Hydrotherapy Plan: Debridement;Dressing change;Patient/family education;Pulsatile lavage with suction Wound Therapy - Frequency: 6X / week Wound Therapy - Follow Up Recommendations: Home health RN  Wound Therapy Goals- Improve the function of patient's integumentary system by progressing the wound(s) through the phases of wound healing (inflammation - proliferation - remodeling) by: Decrease Necrotic Tissue to: 50% Decrease Necrotic Tissue - Progress: Goal set today Increase Granulation Tissue to: 50 Increase Granulation Tissue - Progress: Goal set today Decrease Length/Width/Depth by (cm): 0.1 Decrease Length/Width/Depth - Progress: Goal set today Patient/Family will be able to : verbalize plan for dressing changes for d/c home (HHRN vs family to do--TBD) Patient/Family Instruction Goal - Progress: Goal set today Goals/treatment plan/discharge plan were made with and agreed upon by patient/family: Yes Time For Goal Achievement: 7 days Wound Therapy - Potential for Goals: Excellent  Goals will be updated until maximal potential achieved or discharge criteria met.  Discharge criteria: when goals achieved, discharge from hospital, MD decision/surgical intervention, no progress towards goals, refusal/missing three consecutive treatments without  notification or medical reason.  Ramin Zoll 03/17/2012, 5:09 PM Pager [phone]

## 2012-03-17 NOTE — Progress Notes (Addendum)
Physical Therapy Note  Patient Details  Name: Frank Brock MRN: [id number] Date of Birth: 06-16-1960 Today's Date: 03/17/2012  1000-1055 (55 minutes) individual Pain: no reported pain Focus of treatment: Therapeutic activities focused on standing balance, core strengthening; gait training Treatment: Sit to stand to RW min to SBA; standing performing reaching activities, diagonal PNF patterns with weighted therapy ball in standing; up / down 4 inch step alternating LEs with RW support X 10 for quad strengthening; gait 80 feet RW min assist with min knee extension instability in stance.    1445-1530 (45 minutes) individual Pain: no reported pain Focus of treatment: gait training/endurance; therapeutic exercise focused on bilateral LE strengthening/activity tolerance Treatment: Gait- 140 feet X 2 RW min assist at hip to facilitate anterior pelvic tilt vs standing using Y ligaments; Nustep Level 5 LEs only.   Cebastian Neis,JIM 03/17/2012, 12:37 PM

## 2012-03-18 ENCOUNTER — Inpatient Hospital Stay (HOSPITAL_COMMUNITY): Payer: Medicare HMO | Admitting: Occupational Therapy

## 2012-03-18 ENCOUNTER — Inpatient Hospital Stay (HOSPITAL_COMMUNITY): Payer: Medicare HMO | Admitting: *Deleted

## 2012-03-18 ENCOUNTER — Inpatient Hospital Stay (HOSPITAL_COMMUNITY): Payer: Medicare HMO

## 2012-03-18 LAB — CBC
Platelets: 419 10*3/uL — ABNORMAL HIGH (ref 150–400)
RDW: 14 % (ref 11.5–15.5)
WBC: 7.3 10*3/uL (ref 4.0–10.5)

## 2012-03-18 MED ORDER — BENEPROTEIN PO POWD
1.0000 | Freq: Three times a day (TID) | ORAL | Status: DC | PRN
  Filled 2012-03-18: qty 227

## 2012-03-18 NOTE — Consult Note (Signed)
WOC follow up Wound type: left chest wall s/p surgical removal of abscess  Wound ZOX:WRUE, 100% fully granulated, almost to skin level.  Would be ok to switch to dry dressing at time of discharge. Drainage (amount, consistency, odor) minimal Periwound:intact  Dressing procedure/placement/frequency:continue hydrogel this week, ok to dc with dry dressing only.   WOC will meet with hydrotherapy in the am to evaluate sacral wound at that time.   Rondale Nies [place] RN,CWOCN [phone]

## 2012-03-18 NOTE — Progress Notes (Signed)
Subjective/Complaints: No new issues. Excited to go home.  A 12 point review of systems has been performed and if not noted above is otherwise negative.   Objective: Vital Signs: Blood pressure 116/64, pulse 74, temperature 98 F (36.7 C), temperature source Oral, resp. rate 20, weight 60.3 kg (132 lb 15 oz), SpO2 97.00%. No results found.  Recent Labs  03/18/12 0715  WBC 7.3  HGB 10.0*  HCT 30.2*  PLT 419*    Recent Labs  03/15/12 0911  NA 132*  K 3.9  CL 99  GLUCOSE 168*  BUN 13  CREATININE 0.71  CALCIUM 9.4   CBG (last 3)  No results found for this basename: GLUCAP,  in the last 72 hours  Wt Readings from Last 3 Encounters:  03/14/12 60.3 kg (132 lb 15 oz)  02/25/12 76.2 kg (167 lb 15.9 oz)  02/25/12 76.2 kg (167 lb 15.9 oz)    Physical Exam:  Constitutional: He is oriented to person, place, and time. He appears well-developed. He has a sickly appearance.  Cardiovascular: Normal rate and regular rhythm.  Pulmonary/Chest: Effort normal. He has rales in the left middle field.  Abdominal: Soft. Bowel sounds are normal.  Musculoskeletal: He exhibits no tenderness.  Minimal edema left forearm. Bilateral heels boggy--non tender.  Neurological: He is alert and oriented to person, place, and time. A bit distracted but generally appropriate  decreased sensation rectally. Decreased sensation to LT in both hands and feet.  DTR's 1+, no resting tone. Good voice/phonation, cognition is generally intact.  MOTOR TESTING:  Muscle Right Left  Deltoid 4   4 Triceps 4  4 Biceps 4  4 Wrist ext 4 4 HI 3+ 3+ HF 3+ 3+  KE 3+ 3+ ADF 4+ 4+  APF 4+ 4 + Skin:  Posterior neck incision clean and dry with steri strips in place. Lower chest wall with sutures anteriorly. Upper left chest wall with VAC in place and sealed. Sacrum with small area fibronecrotic tissue near sacrum which has more fibronecrotic tissue at present.   Assessment/Plan: 1. 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 Pt with central cord clinical picture. He is s/p surgical decompression which require 3+ hours per day of interdisciplinary therapy in a comprehensive inpatient rehab setting. Physiatrist is providing close team supervision and 24 hour management of active medical problems listed below. Physiatrist and rehab team continue to assess barriers to discharge/monitor patient progress toward functional and medical goals. FIM: FIM - Bathing Bathing Steps Patient Completed: Chest;Right Arm;Left Arm;Abdomen;Front perineal area;Buttocks;Right upper leg;Left upper leg;Right lower leg (including foot);Left lower leg (including foot) Bathing: 5: Supervision: Safety issues/verbal cues  FIM - Upper Body Dressing/Undressing Upper body dressing/undressing steps patient completed: Thread/unthread right sleeve of pullover shirt/dresss;Pull shirt over trunk;Thread/unthread left sleeve of pullover shirt/dress;Put head through opening of pull over shirt/dress Upper body dressing/undressing: 5: Set-up assist to: Obtain clothing/put away FIM - Lower Body Dressing/Undressing Lower body dressing/undressing steps patient completed: Thread/unthread right pants leg;Thread/unthread left pants leg;Pull pants up/down;Don/Doff right sock;Don/Doff left sock;Don/Doff right shoe;Don/Doff left shoe Lower body dressing/undressing: 5: Supervision: Safety issues/verbal cues  FIM - Toileting Toileting: 0: Activity did not occur  FIM - Diplomatic Services operational officer Devices: Elevated toilet seat;Grab bars Toilet Transfers: 5-To toilet/BSC: Supervision (verbal cues/safety issues)  FIM - Banker Devices: Arm rests;Bed rails Bed/Chair Transfer: 5: Supine > Sit: Supervision (verbal cues/safety issues);5: Bed > Chair or W/C: Supervision (  verbal cues/safety issues);5: Chair or W/C > Bed:  Supervision (verbal cues/safety issues);5: Sit > Supine: Supervision (verbal cues/safety issues)  FIM - Locomotion: Wheelchair Distance: 200 Locomotion: Wheelchair: 6: Travels 150 ft or more, turns around, maneuvers to table, bed or toilet, negotiates 3% grade: maneuvers on rugs and over door sills independently FIM - Locomotion: Ambulation Locomotion: Ambulation Assistive Devices: Designer, industrial/product Ambulation/Gait Assistance: 3: Mod assist Locomotion: Ambulation: 2: Travels 50 - 149 ft with moderate assistance (Pt: 50 - 74%)  Comprehension Comprehension Mode: Auditory Comprehension: 7-Follows complex conversation/direction: With no assist  Expression Expression Mode: Verbal Expression: 7-Expresses complex ideas: With no assist  Social Interaction Social Interaction: 6-Interacts appropriately with others with medication or extra time (anti-anxiety, antidepressant).  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  2. Pain Management: Cervical collar prn for comfort.   3. Mood: Seems to have a good outlook. No active issues at present.  4. Neuropsych: This patient is capable of making decisions on his/her own behalf.  5. Disseminated MRSA: S/P evacuation of abscess and minithorocotomy Vancomycin 6 weeks post-op from (1/23)  6. Anemia due to infection/crutical illness: Added iron supplement.  7. Hyponatremia: stable/improved (132)--recheck prior to dc 8. Abnormal LFTs: Likely due to sepsis. No GI symptoms. Stable to improved today 9. Neurogenic bowel/bladder-   -bowel control much better  -probiotic  added  -may have prostate hypertrophy  As well.  -increased flomax to bid   -ditropan-can resume if he has any spasms  - foley replaced again. We will schedule with urology as an outpt  10. Wound care: wet to dry  to chest wound.   -continue santyl/moist dressing to sacral wound daily- this doesn't adhere well to area given size,location  -hydrotherapy working on patient until dc from hospital.   -pt is directing pressure relief, turning 11. Insomnia: trazodone   .   LOS (Days) 22 A FACE TO FACE EVALUATION WAS PERFORMED  Bhumi Godbey T 03/18/2012 7:57 AM

## 2012-03-18 NOTE — Patient Care Conference (Signed)
Inpatient RehabilitationTeam Conference and Plan of Care Update Date: 03/18/2012   Time: 2:00 PM    Patient Name: Frank Brock      Medical Record Number: [id number]  Date of Birth: [birth year] Sex: Male         Room/Bed: 4035/4035-01 Payor Info: Payor: [employer] MEDICARE  Plan: [employer] MEDICARE HMO/PPO  Product Type: *No Product type*     Admitting Diagnosis: CORD COMPRESSION WITH QUADRAPLEGIA  Admit Date/Time:  02/25/2012  5:31 PM Admission Comments: No comment available   Primary Diagnosis:  Quadriplegia Principal Problem: Quadriplegia  Patient Active Problem List   Diagnosis Date Noted  . Sepsis 02/19/2012  . 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 Hypokalemia 02/19/2012  . Anemia 02/19/2012  . Acute hyperglycemia 02/19/2012  . DVT of upper extremity (deep vein thrombosis) 02/14/2012  . Quadriplegia 02/14/2012  . Bacteremia due to methicillin resistant Staphylococcus aureus 02/11/2012  . S/P VATS (1/19) 02/11/2012  . Chest wall abscess 02/10/2012  . Lung abscess 02/10/2012  . Acute respiratory failure with hypoxia 02/08/2012  . Hypoxemia 02/08/2012  . H/O HTN 02/06/2012  . H/O hypercholesteremia 02/06/2012  . H/O stroke 02/06/2012    Expected Discharge Date: Expected Discharge Date: 03/21/12  Team Members Present: Physician leading conference: Dr. Faith Rogue Nurse Present: Daryll Brod, RN PT Present: Other (comment) Sherrine Maples, PT) OT Present: Mackie Pai, OT Other (Discipline and Name): Tora Duck, PPS Coordinator     Current Status/Progress Goal Weekly Team Focus  Medical   voiding trial unsuccessful. outpt urology consult. foley back in. hydrotherapy for wound. making improvements  prepare wounds for home maintenance  wound care/hydrotherapy   Bowel/Bladder   Continent of bowel. LBM 03/16/12. Foley to straight drain with yellow, clear, urine  Managed bowel and bladder  Continue with timed toileting   Swallow/Nutrition/ Hydration              ADL's   overall supervision -> min assist  overall supervision   D/C planning, functional use of BUEs, dynamic standing, functional ambulation/mobility with RW, safety with RW, IADLs   Mobility   S/min A overall; more gait with RW with min to mod A (when LOB) and stairs  mod I w/c mobilit, S transfers and S/min A gait  d/c planning, family education, gait, balance   Communication             Safety/Cognition/ Behavioral Observations            Pain   Oxy IR 10mg  q 4hrs prn, Robaxin 500mg  and Tramadol 100mg  q 6hrs, prn  <4  Offer pain medication 1 hr prior to therapy   Skin   Unstageable pressure ulcer with allevyn dressing. Re-initiating hydrotherapy in the a.m  No additional skin breakdown  Routine turn q 2hrs    Rehab Goals Patient on target to meet rehab goals: Yes *See Interdisciplinary Assessment and Plan and progress notes for long and short-term goals  Barriers to Discharge: no new    Possible Resolutions to Barriers:  see prior    Discharge Planning/Teaching Needs:  Plan to return home with mother and step-father.  Ex-wife to provide assistance when mother at work.  to schedule family education prior to d/c   Team Discussion:  recommmend urology f/u as outpatient.  Hydrotherapy re-started and will run through remainder of week.  Can stop wearing soft collar.  Planning family education for 2/27 afternoon.  Approaching goals.  Revisions to Treatment Plan:  Hydro re-started.  No other changes.  Continued Need for Acute Rehabilitation Level of Care: The patient requires daily medical management by a physician with specialized training in physical medicine and rehabilitation for the following conditions: Daily direction of a multidisciplinary physical rehabilitation program to ensure safe treatment while eliciting the highest outcome that is of practical value to the patient.: Yes Daily medical management of patient stability for increased activity during participation in  an intensive rehabilitation regime.: Yes Daily analysis of laboratory values and/or radiology reports with any subsequent need for medication adjustment of medical intervention for : Post surgical problems;Neurological problems (urology)  Alonza Bogus, Aubriella Perezgarcia 03/18/2012, 2:34 PM

## 2012-03-18 NOTE — Progress Notes (Signed)
52 yo M admitted to rehab 02/25/2012 after admission starting 1/7 for SOB confusion and left shoulder pain. Now s/p abscess evacuation (1/19) and spinal cord decompression (1/23). Patient noted to have LUE DVT on 1/22. Pharmacy consulted to dose 6 week course of vancomycin and lovenox.  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 Considering Xarelto or warf. CBC stable, Scr 0.71 on 02/22. Lovenox 60 mg q12h, based on most recent weight (down from admission). H/H stable at 10/30.2 and Plt 419 K on 02/25  ID: MRSA bacteremia and diffuse spinal infxn on MRI, s/p I&D of chest wall abscess drainage, thoracotomy, and VAC placement with chest tube 1/19, debridement 1/20. Afebrile, WBC 7.3 on 02/25. Plan 6wks of ABX (ends 3/6).  UOP down to 0.6 ml/kg/hr.  Was going to have vanc trough drawn today but delayed Cristiano Capri retime for tom  2/17 urine>>neg final.  MRSA PCR 1/17 >> POS Resp 1/17 >> MRSA  Blood 1/17 >> MRSA  1/18 bcx x2>> negative 1/19 L chest abscess>> abundant MRSA  1/23 L neck abscess>> few MRSA  Vanc 1/17 >> (3/6) 1/21 VT = 13.5 (on 1gm q8h) 1/24 VT = 17.4 on 1250 q8h 1/27 VT = 17.7 on 1250mg  q8h 2/3 VT = 24.2 on 1250 mg q8h ->chg to q12h 2/9 VT = 14.8 (drawn 1.5 hr late) >> continue same dose 2/17 VT = 16.4 (drawn 1.5 hr late) >> slight accumulation from last week's trough but still within goal. No change. 2/26 VT =   Tamiflu 1/17 >> 1/19 Zosyn 1/17 >> 1/20 Gent 1/24 >> 1/27 (trough was therapeutic at 0.6)  Goal of Therapy:  Vanco trough 15-20  LMWH level 0.6-1.2 with 4 hrs post-dose  Monitor platelets by anticoagulation protocol: Yes   Plan:  Lovenox 60mg  sq q12hrs  Vancomycin 1250mg  q 12hrs. Retime trough to tom due to delay Monitor renal function closely.

## 2012-03-18 NOTE — Progress Notes (Addendum)
Occupational Therapy Session Notes  Patient Details  Name: DHANVIN SZETO MRN: [id number] Date of Birth: 1960/04/26  Today's Date: 03/18/2012  Short Term Goals: Week 1:  OT Short Term Goal 1 (Week 1): Patient will complete UB dressing sitting edge of bed with moderate assistance OT Short Term Goal 1 - Progress (Week 1): Met OT Short Term Goal 2 (Week 1): Patient will complete LB dressing in supine position with maximal assistance OT Short Term Goal 2 - Progress (Week 1): Met OT Short Term Goal 3 (Week 1): Patient will perform grooming tasks seated at sink with miminal assistance OT Short Term Goal 3 - Progress (Week 1): Met OT Short Term Goal 4 (Week 1): BSC will be introduced to patient for bowel program with nursing OT Short Term Goal 4 - Progress (Week 1): Met  Week 2:  OT Short Term Goal 1 (Week 2): Patient will donn shirt seated edge of bed with minimal assistance OT Short Term Goal 1 - Progress (Week 2): Met OT Short Term Goal 2 (Week 2): Patient will complete LB dressing with moderate assistance at bed level OT Short Term Goal 2 - Progress (Week 2): Met OT Short Term Goal 3 (Week 2): Patient will be introduced to a HEP for BUEs OT Short Term Goal 3 - Progress (Week 2): Met OT Short Term Goal 4 (Week 2): Patient will perform toilet transfers with moderate assistance OT Short Term Goal 4 - Progress (Week 2): Met  Week 3:  OT Short Term Goal 1 (Week 3): Short Term Goals = Long Term Goals  Skilled Therapeutic Interventions/Progress Updates:   Session #1 1100-1200 - 60 Minutes Individual Therapy No complaints of pain Patient found supine in bed. Patient engaged in bed mobility for supine->sit and then functional ambulation from edge of bed -> bathroom for ADL retraining at shower level. Focused skilled intervention on functional ambulation/mobility with use of rolling walker, sit/stands, dynamic standing balance/tolerance/endurance, stand pivot transfers, functional use of  bilateral UEs, UB/LB bathing & dressing, grooming tasks seated at sink in w/c, and overall activity tolerance/endurance. Patient left seated in w/c at sink at end of session, patient able to propel throughout room as needed.   Session #2 1300-1500 - 120 Minutes Co-Treatment with recreational therapist No complaints of pain Community re-entry session -> [employer]. Patient found seated in w/c upon entering room. Patient propelled self in w/c from room -> Zenaida Niece that was parked outside (supervision for w/c propulsion). Patient then ambulated into [employer] on [address]. Patient ambulated from handicap parking space -> inside [employer] to sit in motorized scooter in order to conserve energy during shopping. During community re-entry/outing, focused skilled intervention on functional mobility with use of rolling walker or motorized scooter or w/c, functional use of bilateral UEs, sit<>stands, dynamic standing balance, negotiating throughout [employer] with peers/ pedestrians and isles, energy conservation techniques prn, and overall activity tolerance/endurance. When getting on/off Zenaida Niece, patient ambulated on steps with minimal assistance. Patient propelled self back to room at end of session and transferred -> edge of bed with call bell & phone within reach. See hard copy in shadow chart for information on outing and goals of outing, patient met all set goals.   Precautions:  Precautions Precautions: Cervical;Fall Required Braces or Orthoses: Cervical Brace Cervical Brace: Applied in sitting position Restrictions Weight Bearing Restrictions: No  See FIM for current functional status  Zakarie Sturdivant 03/18/2012, 7:39 AM

## 2012-03-18 NOTE — Progress Notes (Signed)
Physical Therapy Session Note  Patient Details  Name: Frank Brock MRN: [id number] Date of Birth: December 25, 1960  Today's Date: 03/18/2012 Time: 1015-1055 Time Calculation (min): 40 min  Short Term Goals: Week 3:  PT Short Term Goal 1 (Week 3): = LTGs  Skilled Therapeutic Interventions/Progress Updates:  Treatment focused on bed mobility, functional transfers, gait with RW, dynamic gait and balance through obstacle course (navigating cones and stepping over objects to simulate home environment), and stair negotiation. Ranging from S to min A with 2 episodes of LOB with mod A to recover during turning sequence. Education and discussion on home mobility, overall safety recommendations for home, and DME/follow up therapy. Mod I w/c mobility on unit for endurance. Pt has w/c available to him at home but will need to discuss with SW about obtaining a pressure relieving cushion due to wound on sacrum. Transferred to toilet with S end of session and nurse tech notified.   Therapy Documentation Precautions:  Precautions Precautions: Cervical;Fall Required Braces or Orthoses: Cervical Brace Cervical Brace: Applied in sitting position Restrictions Weight Bearing Restrictions: No   Pain: Premedicated - no complaints.   Locomotion : Ambulation Ambulation/Gait Assistance: 4: Min assist   See FIM for current functional status  Therapy/Group: Individual Therapy  [name] [facility] 03/18/2012, 11:01 AM

## 2012-03-18 NOTE — Progress Notes (Signed)
Red port of PICC line is occluded and unable to flush.  Purple port is difficult to flush and has no blood return.If patient is going to be discharged with PICC, recommend exchanging this PICC for a single lumen PICC

## 2012-03-18 NOTE — Progress Notes (Signed)
NUTRITION FOLLOW UP  Patient meets criteria for severe malnutrition related to acute illness AEB >20 lb weight loss in the last 2 weeks (12%) and decreased muscle mass and body fat.   Intervention:   1. Discontinue Resource Breeze po TID  2. Change Beneprotein to prn per pt request 3. RD to continue to follow nutrition care plan  Nutrition Dx: Increased nutrient needs r/t wound healing AEB estimated needs. Ongoing.  Goal:   Pt to meet >/= 90% of their estimated nutrition needs; progressing.   Monitor:   PO intake, weight, wounds  Assessment:   Continues with hydrotherapy. Working towards 2/28 d/c. Meal intake remains optimal, consuming 75 - 100%.  Receiving Resource Breeze TID and Beneprotein TID. Pt states that he does not drink the Resource Breeze supplements. He states that he prefers the protein powder. Discussed his weight status, pt has lost almost 30 lb since admission according to documented weights. Pt asking to be weighed with standing scale tomorrow, discussed with RN and tech. Pt is adamant that he has not lost that much weight. He reports eating several snacks and foods from outside of the hospital on a regular basis.  Height: Ht Readings from Last 1 Encounters:  02/10/12 5\' 10"  (1.778 m)    Weight Status:  Admission weight 159 lb Wt Readings from Last 1 Encounters:  03/14/12 132 lb 15 oz (60.3 kg)  trending down  Estimated needs:  Kcal: 2000-2200  Protein: 80-95 gm  Fluid: 2.2-2.3L  Skin: Chest wound, unstageable pressure ulcer on coccyx  Diet Order: Cardiac; Resource Breeze PO TID    Intake/Output Summary (Last 24 hours) at 03/18/12 1155 Last data filed at 03/18/12 1610  Gross per 24 hour  Intake    600 ml  Output    726 ml  Net   -126 ml    Last BM: 2/24   Labs:   Recent Labs Lab 03/15/12 0911  NA 132*  K 3.9  CL 99  CO2 24  BUN 13  CREATININE 0.71  CALCIUM 9.4  GLUCOSE 168*    CBG (last 3)  No results found for this basename:  GLUCAP,  in the last 72 hours  Scheduled Meds: . alteplase  2 mg Intracatheter Once  . amLODipine  10 mg Oral Daily  . antiseptic oral rinse  1 application Mouth Rinse QID  . chlorhexidine  15 mL Mouth Rinse BID  . enoxaparin (LOVENOX) injection  60 mg Subcutaneous Q12H  . feeding supplement  1 Container Oral TID BM  . magic mouthwash  5 mL Oral QID  . protein supplement  1 scoop Oral TID WC  . saccharomyces boulardii  250 mg Oral BID  . Tamsulosin HCl  0.4 mg Oral BID  . traZODone  50 mg Oral QHS  . vancomycin  1,250 mg Intravenous Q12H    Continuous Infusions:  none  Jarold Motto MS, RD, LDN Pager: [phone] After-hours pager: [phone]

## 2012-03-18 NOTE — Progress Notes (Signed)
Physical Therapy Wound Treatment Patient Details  Name: Frank Brock MRN: [id number] Date of Birth: 17-Mar-1960  Today's Date: 03/18/2012 Time: 0832-0901 Time Calculation (min): 29 min  Subjective  Subjective: Pt reports very dull pain if any Patient and Family Stated Goals: heal wound Prior Treatments: santyl, hydrotherapy, hydrogel, allevyn  Pain Score: Pain Score:   4  Wound Assessment  Pressure Ulcer 02/13/12 Unstageable - Full thickness tissue loss in which the base of the ulcer is covered by slough (yellow, tan, gray, green or brown) and/or eschar (tan, brown or black) in the wound bed. 2cm x 3cm unstageable PU, pink edges with yello (Active)  State of Healing Eschar 03/18/2012  9:00 AM  Site / Wound Assessment Yellow 03/18/2012  9:00 AM  % Wound base Red or Granulating 0% 03/18/2012  9:00 AM  % Wound base Yellow 100% 03/18/2012  9:00 AM  % Wound base Other (Comment) 50% 03/14/2012 10:30 PM  Peri-wound Assessment Intact;Erythema (blanchable) 03/18/2012  9:00 AM  Wound Length (cm) 1.1 cm 03/17/2012  4:48 PM  Wound Width (cm) 0.9 cm 03/17/2012  4:48 PM  Wound Depth (cm) 0.1 cm 03/17/2012  4:48 PM  Margins Unattacted edges (unapproximated) 03/18/2012  9:00 AM  Drainage Amount Scant 03/18/2012  9:00 AM  Drainage Description Serous 03/18/2012  9:00 AM  Treatment Debridement (Selective);Hydrotherapy (Pulse lavage);Packing (Saline gauze) 03/18/2012  9:00 AM  Dressing Type Gauze (Comment);Moist to moist;Other (Comment) 03/18/2012  9:00 AM  Dressing Changed 03/18/2012  9:00 AM     Incision 02/11/12 Chest Left (Active)  Site / Wound Assessment Clean;Dry 03/17/2012  9:00 AM  Incision Length (cm) 7 cm 03/11/2012  7:40 PM  Margins Unattacted edges (unapproximated) 03/17/2012  9:00 AM  Closure None 03/11/2012  7:40 PM  Drainage Amount None 03/16/2012 11:49 AM  Drainage Description Serous 02/25/2012 11:00 PM  Treatment Cleansed 03/11/2012  7:40 PM  Dressing Type Gauze (Comment) 03/17/2012  8:25 PM   Dressing Clean;Dry;Intact 03/17/2012  8:25 PM     Incision 02/14/12 Neck Posterior (Active)  Site / Wound Assessment Clean;Dry 03/17/2012  9:00 AM  Margins Attached edges (approximated) 03/17/2012  9:00 AM  Closure None 03/17/2012  8:25 PM  Drainage Amount None 03/17/2012  8:25 PM  Drainage Description Sanguineous 02/26/2012  9:00 AM  Dressing Type None 03/17/2012  8:25 PM  Dressing Clean;Dry 03/09/2012  8:50 PM   Hydrotherapy Pulsed lavage therapy - wound location: sacrum Pulsed Lavage with Suction (psi): 12 psi Pulsed Lavage with Suction - Normal Saline Used: 1000 mL Pulsed Lavage Tip: Tip with splash shield Selective Debridement Selective Debridement - Location: sacrum Selective Debridement - Tools Used: Forceps;Scissors Selective Debridement - Tissue Removed: yellow eschar/slough   Wound Assessment and Plan  Wound Therapy - Assess/Plan/Recommendations Wound Therapy - Clinical Statement: Wound has been slow to heal due to continued yellow eschar. Able to debride some of the necrotic tissue today; applied chemical debridement agent to facilitate further debridement. Will continue trial of hydrotherapy as indicated. Wound Therapy - Functional Problem List: decr skin integrity with incr risk of infection and limiting sitting tolerance Factors Delaying/Impairing Wound Healing: Altered sensation;Incontinence;Immobility Hydrotherapy Plan: Debridement;Dressing change;Patient/family education;Pulsatile lavage with suction Wound Therapy - Frequency: 6X / week Wound Therapy - Follow Up Recommendations: Home health RN  Wound Therapy Goals- Improve the function of patient's integumentary system by progressing the wound(s) through the phases of wound healing (inflammation - proliferation - remodeling) by: Decrease Necrotic Tissue to: 50% Decrease Necrotic Tissue - Progress: Progressing toward goal Increase Granulation  Tissue to: 50 Increase Granulation Tissue - Progress: Progressing toward  goal Decrease Length/Width/Depth by (cm): 0.1 Decrease Length/Width/Depth - Progress: Progressing toward goal Patient/Family will be able to : verbalize plan for dressing changes for d/c home [facility] vs family to do--TBD) Patient/Family Instruction Goal - Progress: Progressing toward goal Goals/treatment plan/discharge plan were made with and agreed upon by patient/family: Yes Time For Goal Achievement: 7 days Wound Therapy - Potential for Goals: Excellent  Goals will be updated until maximal potential achieved or discharge criteria met.  Discharge criteria: when goals achieved, discharge from hospital, MD decision/surgical intervention, no progress towards goals, refusal/missing three consecutive treatments without notification or medical reason.  GP     Fabio Asa 03/18/2012, 9:12 AM  Charlotte Crumb, PT DPT  [phone]

## 2012-03-19 ENCOUNTER — Inpatient Hospital Stay (HOSPITAL_COMMUNITY): Payer: Medicare HMO

## 2012-03-19 ENCOUNTER — Inpatient Hospital Stay (HOSPITAL_COMMUNITY): Payer: Medicare HMO | Admitting: Occupational Therapy

## 2012-03-19 DIAGNOSIS — G8250 Quadriplegia, unspecified: Secondary | ICD-10-CM

## 2012-03-19 DIAGNOSIS — G061 Intraspinal abscess and granuloma: Secondary | ICD-10-CM

## 2012-03-19 DIAGNOSIS — L89109 Pressure ulcer of unspecified part of back, unspecified stage: Secondary | ICD-10-CM

## 2012-03-19 DIAGNOSIS — N319 Neuromuscular dysfunction of bladder, unspecified: Secondary | ICD-10-CM

## 2012-03-19 DIAGNOSIS — J9600 Acute respiratory failure, unspecified whether with hypoxia or hypercapnia: Secondary | ICD-10-CM

## 2012-03-19 LAB — VANCOMYCIN, TROUGH: Vancomycin Tr: 22.9 ug/mL — ABNORMAL HIGH (ref 10.0–20.0)

## 2012-03-19 MED ORDER — VANCOMYCIN HCL IN DEXTROSE 1-5 GM/200ML-% IV SOLN
1000.0000 mg | Freq: Two times a day (BID) | INTRAVENOUS | Status: DC
  Filled 2012-03-19 (×2): qty 200

## 2012-03-19 MED ORDER — VANCOMYCIN HCL IN DEXTROSE 1-5 GM/200ML-% IV SOLN
1000.0000 mg | Freq: Two times a day (BID) | INTRAVENOUS | Status: DC
  Administered 2012-03-19 – 2012-03-21 (×4): 1000 mg via INTRAVENOUS
  Filled 2012-03-19 (×5): qty 200

## 2012-03-19 NOTE — Progress Notes (Addendum)
52 yo M admitted to rehab 02/25/2012 after admission starting 1/7 for SOB confusion and left shoulder pain. Now s/p abscess evacuation (1/19) and spinal cord decompression (1/23). Patient noted to have LUE DVT on 1/22. Pharmacy consulted to dose 6 week course of vancomycin and lovenox.  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 Considering Xarelto or warf. Scr 0.71 on 02/22. Lovenox 60 mg q12h, updated weight today 70kg. Spoke with RN, will re-weigh pt. after he is back from PT. H/H stable at 10/30.2 and Plt 419 K on 02/25  ID: MRSA bacteremia and diffuse spinal infxn on MRI, s/p I&D of chest wall abscess drainage, thoracotomy, and VAC placement with chest tube 1/19, debridement 1/20. Afebrile, WBC 7.3 on 02/25. Plan 6wks of ABX (ends 3/6). UOP good 1.1 ml/kg/hr. VT = 22.9, drawn 1 hr late, likely accumulating. Est. T1/2 = 10 hrs  2/17 urine>>neg final.  MRSA PCR 1/17 >> POS Resp 1/17 >> MRSA  Blood 1/17 >> MRSA  1/18 bcx x2>> negative 1/19 L chest abscess>> abundant MRSA  1/23 L neck abscess>> few MRSA  Vanc 1/17 >> (3/6) 1/21 VT = 13.5 (on 1gm q8h) 1/24 VT = 17.4 on 1250 q8h 1/27 VT = 17.7 on 1250mg  q8h 2/3 VT = 24.2 on 1250 mg q8h ->chg to q12h 2/9 VT = 14.8 (drawn 1.5 hr late) >> continue same dose 2/17 VT = 16.4 (drawn 1.5 hr late) >> slight accumulation from last week's trough but still within goal. No change. 2/26 VT = 22.9 (drawn 1 hr late) >> change to 1g Q 12  Tamiflu 1/17 >> 1/19 Zosyn 1/17 >> 1/20 Gent 1/24 >> 1/27 (trough was therapeutic at 0.6)  Goal of Therapy:  Vanco trough 15-20  LMWH level 0.6-1.2 with 4 hrs post-dose  Monitor platelets by anticoagulation protocol: Yes   Plan:  Continue Lovenox 60mg  sq q12hrs for now F/u weight to adjust lovnox dose.  Decrease Vancomycin to 1000mg  q 12hrs. Cancel 1400 dose, and change next dose to 2000 this evening Monitor renal function closely.

## 2012-03-19 NOTE — Progress Notes (Signed)
Physical Therapy Wound Treatment Patient Details  Name: HURLEY SOBEL MRN: [id number] Date of Birth: Aug 21, 1960  Today's Date: 03/19/2012 Time: 4098-1191 Time Calculation (min): 24 min  Subjective  Subjective: Pt reports very dull pain if any Patient and Family Stated Goals: heal wound Prior Treatments: santyl, hydrotherapy, hydrogel, allevyn  Pain Score:  no pain  Wound Assessment      03/19/12 0800  Subjective Assessment  Subjective Pt reports very dull pain if any  Patient and Family Stated Goals heal wound  Prior Treatments santyl, hydrotherapy, hydrogel, allevyn  Evaluation and Treatment  Evaluation and Treatment Procedures Explained to Patient/Family Yes  Evaluation and Treatment Procedures agreed to  Pressure Ulcer  Date First Assessed/Time First Assessed: 02/13/12 2100   Location: Coccyx  Location Orientation: Left  Staging: Unstageable - Full thickness tissue loss in which the base of the ulcer is covered by slough (yellow, tan, gray, green or brown) and/or eschar  State of Healing Early/partial granulation  Site / Wound Assessment Clean;Dry;Yellow  % Wound base Red or Granulating 0%  % Wound base Yellow 50%  Peri-wound Assessment Intact  Margins Unattacted edges (unapproximated)  Drainage Amount None  Drainage Description Serous  Treatment Debridement (Selective);Hydrotherapy (Pulse lavage);Other (Comment) (hydrocolloid dressing)  Dressing Type Hydrocolloid  Dressing Changed  Incision  Date First Assessed/Time First Assessed: 02/11/12 0754   Location: Chest  Location Orientation: Left  Site / Wound Assessment Clean;Dry  Margins Unattacted edges (unapproximated)  Dressing Type Gauze (Comment);Negative pressure wound therapy  Dressing Changed  Incision  Date First Assessed/Time First Assessed: 02/14/12 1814   Location: Neck  Location Orientation: Posterior  Site / Wound Assessment Clean;Dry  Margins Attached edges (approximated)  Closure None  Drainage  Amount None  Dressing Type Adhesive strips  Hydrotherapy  Pulsed Lavage with Suction (psi) 12 psi  Pulsed Lavage with Suction - Normal Saline Used 1000 mL  Pulsed Lavage Tip Tip with splash shield  Pulsed lavage therapy - wound location sacrum  Selective Debridement  Selective Debridement - Location sacrum  Selective Debridement - Tools Used Forceps;Scissors  Selective Debridement - Tissue Removed yellow eschar/slough  Wound Therapy - Assess/Plan/Recommendations  Wound Therapy - Clinical Statement Wound has been slow to heal due to continued yellow eschar. Able to debride some of the necrotic tissue today; applied new dressing (hydrocolloid) to facilitate autolytic debridement. Will continue trial of hydrotherapy as indicated.  Wound Therapy - Functional Problem List decr skin integrity with incr risk of infection and limiting sitting tolerance  Factors Delaying/Impairing Wound Healing Altered sensation;Incontinence;Immobility  Hydrotherapy Plan Debridement;Dressing change;Patient/family education;Pulsatile lavage with suction  Wound Therapy - Frequency 6X / week  Wound Therapy - Follow Up Recommendations Home health RN  Wound Therapy Goals - Improve the function of patient's integumentary system by progressing the wound(s) through the phases of wound healing by:  Decrease Necrotic Tissue to 50%  Decrease Necrotic Tissue - Progress Progressing toward goal  Increase Granulation Tissue to 50  Increase Granulation Tissue - Progress Progressing toward goal  Decrease Length/Width/Depth by (cm) 0.1  Decrease Length/Width/Depth - Progress Progressing toward goal  Patient/Family will be able to  verbalize plan for dressing changes for d/c home [facility] vs family to do--TBD)  Patient/Family Instruction Goal - Progress Progressing toward goal  Goals/treatment plan/discharge plan were made with and agreed upon by patient/family Yes  Time For Goal Achievement 7 days  Wound Therapy - Potential for Goals  Excellent    Charlotte Crumb, PT DPT  [phone]

## 2012-03-19 NOTE — Progress Notes (Signed)
Peripherally Inserted Central Catheter/Midline Placement  The IV Nurse has discussed with the patient and/or persons authorized to consent for the patient, the purpose of this procedure and the potential benefits and risks involved with this procedure.  The benefits include less needle sticks, lab draws from the catheter and patient may be discharged home with the catheter.  Risks include, but not limited to, infection, bleeding, blood clot (thrombus formation), and puncture of an artery; nerve damage and irregular heat beat.  Alternatives to this procedure were also discussed. DL PICC exchanged for SL PICC PICC/Midline Placement Documentation  PICC / Midline Double Lumen 02/19/12 PICC Right Brachial (Active)  Indication for Insertion or Continuance of Line Prolonged intravenous therapies;Poor Vasculature-patient has had multiple peripheral attempts or PIVs lasting less than 24 hours 03/10/2012  7:46 PM  Length mark (cm) 2 cm 02/27/2012 11:07 AM  Site Assessment Clean;Dry;Intact 03/19/2012  9:30 AM  Lumen #1 Status Flushed;Saline locked 03/19/2012  8:39 AM  Lumen #2 Status Occluded 03/19/2012  8:39 AM  Dressing Type Transparent 03/19/2012  9:30 AM  Dressing Status Clean;Dry;Intact;Antimicrobial disc in place 03/19/2012  9:30 AM  Line Care Cap(s) changed 03/19/2012  8:39 AM  Dressing Intervention Dressing changed;Antimicrobial disc changed 03/18/2012 10:11 AM  Dressing Change Due 03/25/12 03/18/2012 10:11 AM       Vevelyn Pat 03/19/2012, 4:52 PM

## 2012-03-19 NOTE — Progress Notes (Signed)
Occupational Therapy Session Notes  Patient Details  Name: KAIMANI CLAYSON MRN: [id number] Date of Birth: [birth year]  Today's Date: 03/19/2012  Short Term Goals: Week 1:  OT Short Term Goal 1 (Week 1): Patient will complete UB dressing sitting edge of bed with moderate assistance OT Short Term Goal 1 - Progress (Week 1): Met OT Short Term Goal 2 (Week 1): Patient will complete LB dressing in supine position with maximal assistance OT Short Term Goal 2 - Progress (Week 1): Met OT Short Term Goal 3 (Week 1): Patient will perform grooming tasks seated at sink with miminal assistance OT Short Term Goal 3 - Progress (Week 1): Met OT Short Term Goal 4 (Week 1): BSC will be introduced to patient for bowel program with nursing OT Short Term Goal 4 - Progress (Week 1): Met  Week 2:  OT Short Term Goal 1 (Week 2): Patient will donn shirt seated edge of bed with minimal assistance OT Short Term Goal 1 - Progress (Week 2): Met OT Short Term Goal 2 (Week 2): Patient will complete LB dressing with moderate assistance at bed level OT Short Term Goal 2 - Progress (Week 2): Met OT Short Term Goal 3 (Week 2): Patient will be introduced to a HEP for BUEs OT Short Term Goal 3 - Progress (Week 2): Met OT Short Term Goal 4 (Week 2): Patient will perform toilet transfers with moderate assistance OT Short Term Goal 4 - Progress (Week 2): Met  Week 3:  OT Short Term Goal 1 (Week 3): Short Term Goals = Long Term Goals  Skilled Therapeutic Interventions/Progress Updates:   Session #1 [id number] - 28 Minutes Individual Therapy No complaints of new pain, patient pre-medicated prior to session Patient found supine in bed. Patient engaged in bed mobility and edge of bed -> w/c stand pivot transfer at modified independent level. Patient then propelled self -> ADL apartment for IADL task of simple meal prep. Patient used rolling walker for task with steady assist from therapist at times. Goal is for simple meal prep  at min assist level using rolling walker. Patient and therapist problem solved best way for patient to perform kitchen tasks at home at w/c level when alone for safety. Focused skilled intervention on functional ambulation/mobility, IADL task throughout kitchen, functional use of bilateral UEs, and overall activity tolerance/endurance. Patient propelled self back to room in w/c and therapist left patient in w/c to propel around room prn.   Session #2 [id number] - 55 Minutes Individual Therapy No complaints of pain Patient found supine in bed. Patient engaged in bed mobility and edge of bed -> w/c stand pivot transfer at mod I level. Patient then propelled self -> ADL apartment for shower using tub transfer bench in tub/shower unit. Focused skilled intervention on w/c propulsion, UB/LB bathing & dressing, sit<>stands, dynamic standing, overall activity tolerance/endurance, threading/untreading foley bag in/out pants, and functional use of bilateral UEs/hands. Patient propelled self back to room at end of session and therapist left patient in w/c to complete grooming tasks.   Precautions:  Precautions Precautions: Cervical;Fall Required Braces or Orthoses: Cervical Brace Cervical Brace: Applied in sitting position Restrictions Weight Bearing Restrictions: No  See FIM for current functional status  Rashada Klontz 03/19/2012, 12:01 PM

## 2012-03-19 NOTE — Progress Notes (Signed)
Physical Therapy Note  Patient Details  Name: Frank Brock MRN: [id number] Date of Birth: [birth year] Today's Date: 03/19/2012  2:30 - 3:15 45 minutes individual session Patient reports pain/tightness in neck at a 5. "Pretty much stays at a 5."  Patient transferred supine to sit and from bed to wheelchair with modified independence using bed rails and arm rests of wheelchair for stability. Patient propelled wheelchair 200 feet from room to and from gym/apartment. Patient ambulated with rolling walker and close supervision 200 feet x 2 . Patient ambulated over and around objects and up and down 4 inch curb with close supervision. Patient tapped alternating feet on 4 inch step using walker for balance. Patient stood and tossed ball x 4 minutes working on dynamic standing and core trunk control. Patient tends to stand with shoulders posterior to hips. Patient returned to room via wheelchair.   [name] M 03/19/2012, 3:52 PM

## 2012-03-19 NOTE — Progress Notes (Addendum)
Subjective/Complaints: No new issues. Outing went without any problems  A 12 point review of systems has been performed and if not noted above is otherwise negative.   Objective: Vital Signs: Blood pressure 116/69, pulse 78, temperature 97.9 F (36.6 C), temperature source Oral, resp. rate 20, weight 70.8 kg (156 lb 1.4 oz), SpO2 97.00%. No results found.  Recent Labs  03/18/12 0715  WBC 7.3  HGB 10.0*  HCT 30.2*  PLT 419*   No results found for this basename: NA, K, CL, CO, GLUCOSE, BUN, CREATININE, CALCIUM,  in the last 72 hours CBG (last 3)  No results found for this basename: GLUCAP,  in the last 72 hours  Wt Readings from Last 3 Encounters:  03/19/12 70.8 kg (156 lb 1.4 oz)  02/25/12 76.2 kg (167 lb 15.9 oz)  02/25/12 76.2 kg (167 lb 15.9 oz)    Physical Exam:  Constitutional: He is oriented to person, place, and time. He appears well-developed. He has a sickly appearance.  Cardiovascular: Normal rate and regular rhythm.  Pulmonary/Chest: Effort normal. He has rales in the left middle field.  Abdominal: Soft. Bowel sounds are normal.  Musculoskeletal: He exhibits no tenderness.  Minimal edema left forearm. Bilateral heels boggy--non tender.  Neurological: He is alert and oriented to person, place, and time. A bit distracted but generally appropriate  decreased sensation rectally. Decreased sensation to LT in both hands and feet.  DTR's 1+, no resting tone. Good voice/phonation, cognition is generally intact.  MOTOR TESTING:  Muscle Right Left  Deltoid 4   4 Triceps 4  4 Biceps 4  4 Wrist ext 4 4 HI 4    4 HF 4    4  KE 4     4 ADF 4+ 4+  APF 4+ 4 + Skin:  Posterior neck incision clean and dry with steri strips in place. Lower chest wall with sutures anteriorly. Upper left chest wall with VAC in place and sealed. Sacrum with small area fibronecrotic tissue near sacrum which has more fibronecrotic tissue at present.   Assessment/Plan: 1. 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 Pt with central cord clinical picture. He is s/p surgical decompression which require 3+ hours per day of interdisciplinary therapy in a comprehensive inpatient rehab setting. Physiatrist is providing close team supervision and 24 hour management of active medical problems listed below. Physiatrist and rehab team continue to assess barriers to discharge/monitor patient progress toward functional and medical goals. FIM: FIM - Bathing Bathing Steps Patient Completed: Chest;Right Arm;Left Arm;Front perineal area;Abdomen;Buttocks;Right upper leg;Left upper leg;Right lower leg (including foot);Left lower leg (including foot) Bathing: 5: Supervision: Safety issues/verbal cues  FIM - Upper Body Dressing/Undressing Upper body dressing/undressing steps patient completed: Thread/unthread right sleeve of pullover shirt/dresss;Thread/unthread left sleeve of pullover shirt/dress;Put head through opening of pull over shirt/dress;Pull shirt over trunk Upper body dressing/undressing: 5: Set-up assist to: Obtain clothing/put away FIM - Lower Body Dressing/Undressing Lower body dressing/undressing steps patient completed: Thread/unthread right pants leg;Thread/unthread left pants leg;Pull pants up/down;Don/Doff right sock;Don/Doff left sock;Don/Doff right shoe;Don/Doff left shoe Lower body dressing/undressing: 5: Supervision: Safety issues/verbal cues  FIM - Toileting Toileting: 0: Activity did not occur  FIM - Diplomatic Services operational officer Devices: Elevated toilet seat;Grab bars Toilet Transfers: 0-Activity did not occur  FIM - Banker Devices: Arm rests Bed/Chair Transfer: 6: Supine > Sit: No assist;5: Bed > Chair or W/C: Supervision (verbal cues/safety issues)  FIM - Locomotion: Wheelchair Distance: 200 Locomotion: Wheelchair: 6: Travels 150 ft  or more, turns around, maneuvers to table, bed or toilet, negotiates 3% grade: maneuvers on rugs and over door sills independently FIM - Locomotion: Ambulation Locomotion: Ambulation Assistive Devices: Designer, industrial/product Ambulation/Gait Assistance: 4: Min assist Locomotion: Ambulation: 2: Travels 50 - 149 ft with minimal assistance (Pt.>75%)  Comprehension Comprehension Mode: Auditory Comprehension: 7-Follows complex conversation/direction: With no assist  Expression Expression Mode: Verbal Expression: 7-Expresses complex ideas: With no assist  Social Interaction Social Interaction: 7-Interacts appropriately with others - No medications needed.  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  2. Pain Management: Cervical collar prn for comfort.   3. Mood: Seems to have a good outlook. No active issues at present.  4. Neuropsych: This patient is capable of making decisions on his/her own behalf.  5. Disseminated MRSA: S/P evacuation of abscess and minithorocotomy Vancomycin 6 weeks post-op from (1/23)  -hh abx arranged  6. Anemia due to infection/crutical illness: Added iron supplement.  7. Hyponatremia: stable/improved (132)--recheck tomorrow 8. Abnormal LFTs: Likely due to sepsis. No GI symptoms. Stable to improved today 9. Neurogenic bowel/bladder-   -bowel control much better  -probiotic  added  -may have prostate hypertrophy  As well.  -increased flomax to bid- continue even with foley in  -ditropan-can resume if he has any spasms  - foley inplace--otupt urology appt  10. Wound care: wet to dry to chest wound.   -continue santyl/moist dressing to sacral wound daily- this doesn't adhere well to area given size,location  -hydrotherapy/debridement to continue  -pt is directing pressure relief, turning 11.  Insomnia: trazodone has been effective  LOS (Days) 23 A FACE TO FACE EVALUATION WAS PERFORMED  Koury Roddy T 03/19/2012 8:06 AM

## 2012-03-20 ENCOUNTER — Inpatient Hospital Stay (HOSPITAL_COMMUNITY): Payer: Medicare HMO

## 2012-03-20 ENCOUNTER — Inpatient Hospital Stay (HOSPITAL_COMMUNITY): Payer: Medicare HMO | Admitting: Occupational Therapy

## 2012-03-20 LAB — BASIC METABOLIC PANEL
CO2: 26 mEq/L (ref 19–32)
Calcium: 9.8 mg/dL (ref 8.4–10.5)
Creatinine, Ser: 0.82 mg/dL (ref 0.50–1.35)
GFR calc Af Amer: >90 mL/min (ref >90)
GFR calc non Af Amer: >90 mL/min (ref >90)
Sodium: 135 mEq/L (ref 135–145)

## 2012-03-20 MED ORDER — RIVAROXABAN 20 MG PO TABS
20.0000 mg | ORAL_TABLET | Freq: Every day | ORAL | Status: DC
  Administered 2012-03-20: 20 mg via ORAL
  Filled 2012-03-20 (×2): qty 1

## 2012-03-20 MED ORDER — SENNOSIDES-DOCUSATE SODIUM 8.6-50 MG PO TABS
1.0000 | ORAL_TABLET | Freq: Every day | ORAL | Status: DC
  Administered 2012-03-20: 1 via ORAL
  Filled 2012-03-20: qty 1

## 2012-03-20 MED ORDER — ENOXAPARIN SODIUM 80 MG/0.8ML ~~LOC~~ SOLN
70.0000 mg | Freq: Two times a day (BID) | SUBCUTANEOUS | Status: DC
  Filled 2012-03-20 (×2): qty 0.8

## 2012-03-20 NOTE — Progress Notes (Signed)
ANTICOAGULATION CONSULT NOTE - Follow Up Consult  Pharmacy Consult for lovenox Indication: DVT  No Known Allergies  Patient Measurements: Weight: 157 lb 13.6 oz (71.6 kg) Heparin Dosing Weight: 70 kg  Vital Signs: Temp: 97.7 F (36.5 C) (02/27 0628) Temp src: Oral (02/27 0628) BP: 136/74 mmHg (02/27 0628) Pulse Rate: 70 (02/27 0628)  Labs:  Recent Labs  03/18/12 0715  HGB 10.0*  HCT 30.2*  PLT 419*    The CrCl is unknown because both a height and weight (above a minimum accepted value) are required for this calculation.   Medications:  Scheduled:  . alteplase  2 mg Intracatheter Once  . amLODipine  10 mg Oral Daily  . antiseptic oral rinse  1 application Mouth Rinse QID  . enoxaparin (LOVENOX) injection  60 mg Subcutaneous Q12H  . saccharomyces boulardii  250 mg Oral BID  . senna-docusate  1 tablet Oral QHS  . tamsulosin  0.4 mg Oral BID  . traZODone  50 mg Oral QHS  . vancomycin  1,000 mg Intravenous Q12H  . [DISCONTINUED] vancomycin  1,250 mg Intravenous Q12H  . [DISCONTINUED] vancomycin  1,000 mg Intravenous Q12H   Infusions:    Assessment: 52 yo male with DVT is currently on treatment dose of lovenox.  Patient's weight has increased to 71.6 kg (had RN reweighted patient twice). Goal of Therapy:  Anti-Xa level 0.6-1.2 units/ml 4hrs after LMWH dose given Monitor platelets by anticoagulation protocol: Yes   Plan:  1) Change lovenox to 70mg  sq q12h 2) CBC every 72 hours  Phillippa Straub, Tsz-Yin 03/20/2012,10:08 AM

## 2012-03-20 NOTE — Progress Notes (Signed)
Occupational Therapy Session Notes & Discharge Summary  Patient Details  Name: Frank Brock MRN: [id number] Date of Birth: 04-Dec-1960  Today's Date: 03/20/2012  SESSION NOTES  Session #1 [id number] - 55 Minutes Individual Therapy No complaints of pain Patient found supine in bed. Patient engaged in bed mobility at mod I level for edge of bed -> stand with rolling walker for functional ambulation into bathroom for shower stall transfer. Patient transferring at mod I level and performing functional mobility/ambulation with rolling walker with supervision. Patient performed UB/LB bathing at mod I level, UB dressing independently, and LB dressing with supervision. Focused skilled intervention on functional use of bilateral UEs, UB/LB bathing & dressing, functional mobility with rolling walker, shower stall transfer, grooming tasks seated at sink, and overall activity tolerance/endurance. Made patient modified independent within room for squat pivot transfers only into w/c, into bed, and onto toilet. At end of session left patient seated in w/c to finish grooming tasks and to propel around room prn and transfer prn.  Session #2 1300-1330 - 30 Minutes Individual Therapy No complaints of pain Patient found supine in bed with wife and mother present in room. Patient transferred edge of bed -> w/c at mod I level. Patient then propelled self from room -> ADL apartment for education -> wife and mother regarding tub/shower transfers using tub transfer bench, functional mobility/ambulation using rolling walker, safety with rolling walker, fall scenarios, BADLs, IADLs, UE exercises, and energy conservation. At end of session left patient seated edge of mat for next therapy session.   --------------------------------------------------------------------------------------------------  DISCHARGE SUMMARY Patient has met 12 of 12 long term goals due to improved activity tolerance, improved balance, postural  control, ability to compensate for deficits, functional use of  RIGHT upper, RIGHT lower, LEFT upper and LEFT lower extremity, improved attention, improved awareness and improved coordination.  Patient to discharge at overall supervision -> mod I level.  Patient's care partner is independent to provide the necessary supervision prn assistance at discharge.    Reasons goals not met: n/a, all goals met at this time.   Recommendation:  Patient will benefit from ongoing skilled OT services in home health setting to continue to advance functional skills in the area of BADL, iADL and Reduce care partner burden.  Equipment: No equipment provided, patient states he has a tub transfer bench and BSC.   Reasons for discharge: treatment goals met and discharge from hospital  Patient/family agrees with progress made and goals achieved: Yes  Precautions/Restrictions  Precautions Precautions: Cervical;Fall Required Braces or Orthoses:  (soft collar brace discharged) Restrictions Weight Bearing Restrictions: No  ADL - See FIM  Vision/Perception  Vision - History Baseline Vision: Wears glasses only for reading Patient Visual Report: No change from baseline Vision - Assessment Eye Alignment: Within Functional Limits Perception Perception: Within Functional Limits Praxis Praxis: Intact   Cognition Overall Cognitive Status: Appears within functional limits for tasks assessed Arousal/Alertness: Awake/alert Orientation Level: Oriented X4 Memory: Appears intact Problem Solving: Appears intact Safety/Judgment: Appears intact  Sensation Coordination Gross Motor Movements are Fluid and Coordinated: Yes Fine Motor Movements are Fluid and Coordinated: Yes Coordination and Movement Description: Patient continues to present with decreased gross & fine motor coordination along with fluid movements. However overall control and coordination has improved during CIR stay. Patient is able to functionally  use bilateral UEs/hands during functional tasks.   Motor - See Discharge Naviagtor  Mobility  - See Discharge Naviagtor  Trunk/Postural Assessment  - See Discharge Naviagtor  Balance -  See Discharge Naviagtor  Extremity/Trunk Assessment RUE Assessment RUE Assessment: Exceptions to [facility] RUE AROM (degrees) RUE Overall AROM Comments: Patient continues to have decreased AROM throughout RUE, however patient is now able to functional actively use RUE during functional tasks such as brushing hair, donning shirt, and reaching for items above head. Elbow continues to be [facility] and wrist continues to be [facility]. Active shoulder flextion =180* RUE PROM (degrees) RUE Overall PROM Comments: PROM is [facility] RUE Strength RUE Overall Strength Comments: Patient with 3+/5 strenth throughout shoulder and 4/5 for elbow strength LUE Assessment LUE Assessment: Exceptions to WFL LUE AROM (degrees) LUE Overall AROM Comments: Patient continues to have decreased AROM thorughout LUE, however patient is not able to functionall actively use LUE during functional tasks such as brushing hair, donning shirt, and reaching for items above head. Elbow continues to be [facility] and wrist continues to be [facility]. Active shoulder flexion =180*  LUE PROM (degrees) LUE Overall PROM Comments: PROM is WFL LUE Strength LUE Overall Strength Comments: 3/5 overall shoulder strength, 3+/5 overall elbow strength  See FIM for current functional status  Frank Brock 03/20/2012, 11:15 AM

## 2012-03-20 NOTE — Progress Notes (Signed)
Subjective/Complaints: No new issues. Outing went without any problems  A 12 point review of systems has been performed and if not noted above is otherwise negative.   Objective: Vital Signs: Blood pressure 136/74, pulse 70, temperature 97.7 F (36.5 C), temperature source Oral, resp. rate 20, weight 70.3 kg (154 lb 15.7 oz), SpO2 97.00%. No results found.  Recent Labs  03/18/12 0715  WBC 7.3  HGB 10.0*  HCT 30.2*  PLT 419*   No results found for this basename: NA, K, CL, CO, GLUCOSE, BUN, CREATININE, CALCIUM,  in the last 72 hours CBG (last 3)  No results found for this basename: GLUCAP,  in the last 72 hours  Wt Readings from Last 3 Encounters:  03/20/12 70.3 kg (154 lb 15.7 oz)  02/25/12 76.2 kg (167 lb 15.9 oz)  02/25/12 76.2 kg (167 lb 15.9 oz)    Physical Exam:  Constitutional: He is oriented to person, place, and time. He appears well-developed. He has a sickly appearance.  Cardiovascular: Normal rate and regular rhythm.  Pulmonary/Chest: Effort normal. He has rales in the left middle field.  Abdominal: Soft. Bowel sounds are normal.  Musculoskeletal: He exhibits no tenderness.  Minimal edema left forearm. Bilateral heels boggy--non tender.  Neurological: He is alert and oriented to person, place, and time. A bit distracted but generally appropriate  decreased sensation rectally. Decreased sensation to LT in both hands and feet.  DTR's 1+, no resting tone. Good voice/phonation, cognition is generally intact.  MOTOR TESTING:  Muscle Right Left  Deltoid 4   4 Triceps 4  4 Biceps 4  4 Wrist ext 4 4 HI 4    4 HF 4    4  KE 4     4 ADF 4+ 4+  APF 4+ 4 + Skin:  Posterior neck incision clean and dry with steri strips in place. Lower chest wall with sutures anteriorly. Upper left chest wall with VAC in place and sealed. Sacrum with small area fibronecrotic tissue near sacrum which has more fibronecrotic tissue at present.   Assessment/Plan: 1. 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 Pt with central cord clinical picture. He is s/p surgical decompression which require 3+ hours per day of interdisciplinary therapy in a comprehensive inpatient rehab setting. Physiatrist is providing close team supervision and 24 hour management of active medical problems listed below. Physiatrist and rehab team continue to assess barriers to discharge/monitor patient progress toward functional and medical goals. FIM: FIM - Bathing Bathing Steps Patient Completed: Chest;Right Arm;Left Arm;Abdomen;Front perineal area;Buttocks;Right upper leg;Left upper leg;Right lower leg (including foot);Left lower leg (including foot) Bathing: 5: Supervision: Safety issues/verbal cues  FIM - Upper Body Dressing/Undressing Upper body dressing/undressing steps patient completed: Thread/unthread right sleeve of pullover shirt/dresss;Put head through opening of pull over shirt/dress;Pull shirt over trunk;Thread/unthread left sleeve of pullover shirt/dress Upper body dressing/undressing: 5: Set-up assist to: Obtain clothing/put away FIM - Lower Body Dressing/Undressing Lower body dressing/undressing steps patient completed: Thread/unthread right pants leg;Thread/unthread left pants leg;Pull pants up/down;Don/Doff right sock;Don/Doff left sock;Don/Doff right shoe;Don/Doff left shoe Lower body dressing/undressing: 5: Supervision: Safety issues/verbal cues  FIM - Toileting Toileting: 0: Activity did not occur  FIM - Diplomatic Services operational officer Devices: Elevated toilet seat;Grab bars Toilet Transfers: 0-Activity did not occur  FIM - Banker Devices: Arm rests Bed/Chair Transfer: 6: Assistive device: no helper  FIM - Locomotion: Wheelchair Distance: 200 Locomotion: Wheelchair: 6:  Travels 150 ft or more, turns around, maneuvers to table, bed or  toilet, negotiates 3% grade: maneuvers on rugs and over door sills independently FIM - Locomotion: Ambulation Locomotion: Ambulation Assistive Devices: Designer, industrial/product Ambulation/Gait Assistance: 5: Supervision Locomotion: Ambulation: 5: Travels 150 ft or more with supervision/safety issues  Comprehension Comprehension Mode: Auditory Comprehension: 7-Follows complex conversation/direction: With no assist  Expression Expression Mode: Verbal Expression: 7-Expresses complex ideas: With no assist  Social Interaction Social Interaction: 7-Interacts appropriately with others - No medications needed.  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  2. Pain Management: Cervical collar prn for comfort.   3. Mood: Seems to have a good outlook. No active issues at present.  4. Neuropsych: This patient is capable of making decisions on his/her own behalf.  5. Disseminated MRSA: S/P evacuation of abscess and minithorocotomy Vancomycin 6 weeks post-op from (1/23)  -hh abx arranged  6. Anemia due to infection/crutical illness: Added iron supplement.  7. Hyponatremia: stable/improved (132)- 8. Abnormal LFTs: Likely due to sepsis. No GI symptoms. Stable to improved today 9. Neurogenic bowel/bladder-   -actually a little constipated--add senna hs  -probiotic  added  -may have prostate hypertrophy  As well.  -increased flomax to bid- continue even with foley in  - foley inplace--otupt urology appt  10. Wound care: wet to dry to chest wound.   -continue santyl/moist dressing to sacral wound daily-   -hydrotherapy/debridement to continue up until dc  -pt is directing pressure relief, turning  -hh follow up of wound 11. Insomnia: trazodone has been effective- he would like to use at home  LOS (Days) 24 A FACE TO FACE  EVALUATION WAS PERFORMED  Karlin Binion T 03/20/2012 7:30 AM

## 2012-03-20 NOTE — Progress Notes (Signed)
03/20/12 0800  Subjective Assessment  Subjective Pt reports very dull pain if any  Patient and Family Stated Goals heal wound  Prior Treatments santyl, hydrotherapy, hydrogel, allevyn  Evaluation and Treatment  Evaluation and Treatment Procedures Explained to Patient/Family Yes  Evaluation and Treatment Procedures agreed to  Pressure Ulcer  Date First Assessed/Time First Assessed: 02/13/12 2100   Location: Coccyx  Location Orientation: Left  Staging: Unstageable - Full thickness tissue loss in which the base of the ulcer is covered by slough (yellow, tan, gray, green or brown) and/or eschar  State of Healing Early/partial granulation  Site / Wound Assessment Clean;Dry;Yellow  % Wound base Red or Granulating 0%  % Wound base Yellow 50%  Peri-wound Assessment Intact  Margins Unattacted edges (unapproximated)  Drainage Amount None  Drainage Description Serous  Treatment Debridement (Selective);Hydrotherapy (Pulse lavage);Other (Comment) (hydrocolloid dressing)  Dressing Type Hydrocolloid  Dressing Changed  Hydrotherapy  Pulsed Lavage with Suction (psi) 12 psi  Pulsed Lavage with Suction - Normal Saline Used 1000 mL  Pulsed Lavage Tip Tip with splash shield  Pulsed lavage therapy - wound location sacrum  Selective Debridement  Selective Debridement - Location sacrum  Selective Debridement - Tools Used Forceps;Scalpel;Scissors  Selective Debridement - Tissue Removed yellow eschar/slough  Wound Therapy - Assess/Plan/Recommendations  Wound Therapy - Clinical Statement Wound continues to decrease in margins and unviable tissue.  Will continue to provide hydrotherapy in preparation for discharge.  Wound Therapy - Functional Problem List decr skin integrity with incr risk of infection and limiting sitting tolerance  Factors Delaying/Impairing Wound Healing Altered sensation;Incontinence;Immobility  Hydrotherapy Plan Debridement;Dressing change;Patient/family education;Pulsatile lavage with  suction  Wound Therapy - Frequency 6X / week  Wound Therapy - Follow Up Recommendations Home health RN  Wound Therapy Goals - Improve the function of patient's integumentary system by progressing the wound(s) through the phases of wound healing by:  Decrease Necrotic Tissue to 50%  Decrease Necrotic Tissue - Progress Progressing toward goal  Increase Granulation Tissue to 50  Increase Granulation Tissue - Progress Partly met  Decrease Length/Width/Depth by (cm) 0.1  Patient/Family will be able to  verbalize plan for dressing changes for d/c home [facility] vs family to do--TBD)  Patient/Family Instruction Goal - Progress Progressing toward goal  Goals/treatment plan/discharge plan were made with and agreed upon by patient/family Yes  Time For Goal Achievement 7 days  Wound Therapy - Potential for Goals Excellent    Charlotte Crumb, PT DPT  [phone]

## 2012-03-20 NOTE — Progress Notes (Signed)
Physical Therapy Discharge Summary  Patient Details  Name: Frank Brock MRN: [id number] Date of Birth: 31-Jan-1960  Today's Date: 03/20/2012 Time: 0865-7846 Time Calculation (min): 40 min (missed 20 min) No complaints of pain. Treatment session focused on family education with pt's family in regards to transfers, bed mobility in ADL apartment, furniture transfers, gait with RW, simulated car transfer, and overall safety/home recommendations. Pt overall S with gait and standing activities, mod I w/c level and for transfers. Pt able to direct all care needed and confident with d/c tomorrow. Pt declined last 20 min of therapy session.    Patient has met 9 of 9 long term goals due to improved activity tolerance, improved balance, improved postural control, increased strength, decreased pain, ability to compensate for deficits, functional use of  right upper extremity, right lower extremity, left upper extremity and left lower extremity and improved coordination.  Patient to discharge at a wheelchair level Modified Independent and close S/min A for gait with RW.  Patient's care partner is independent to provide the necessary physical and supervision assistance at discharge. Family education was successfully completed.   Reasons goals not met: n/a  Recommendation:  Patient will benefit from ongoing skilled PT services in home health setting to continue to advance safe functional mobility, address ongoing impairments in gait, balance, muscular endurance, activity tolerance, functional mobility, coordination, and minimize fall risk.  Equipment: specialty cushion (due to wound)  Reasons for discharge: treatment goals met and discharge from hospital  Patient/family agrees with progress made and goals achieved: Yes  PT Discharge Precautions/Restrictions Precautions Precautions: Cervical;Fall Required Braces or Orthoses:  (soft collar brace discharged) Restrictions Weight Bearing Restrictions:  No Vision/Perception  Vision - History Baseline Vision: Wears glasses only for reading Perception Perception: Within Functional Limits Praxis Praxis: Intact  Cognition Overall Cognitive Status: Appears within functional limits for tasks assessed Arousal/Alertness: Awake/alert Orientation Level: Oriented X4 Memory: Appears intact Awareness: Appears intact Problem Solving: Appears intact Safety/Judgment: Appears intact Sensation Sensation Proprioception: Impaired by gross assessment Additional Comments: 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, numbness and tinling in bilateral hands  Coordination Gross Motor Movements are Fluid and Coordinated: Yes Motor  Motor Motor: Ataxia;Tetraplegia (mild ataxia with gait (improving))  Locomotion  Ambulation Ambulation/Gait Assistance: 5: Firefighter Assistance: 6: Modified independent (Device/Increase time)  Trunk/Postural Assessment  Cervical Assessment Cervical Assessment: Within Functional Limits Thoracic Assessment Thoracic Assessment: Exceptions to [facility] Lumbar Assessment Lumbar Assessment: Exceptions to [facility] Postural Control Postural Control: Within Functional Limits  Balance Static Sitting Balance Static Sitting - Level of Assistance: 7: Independent Dynamic Sitting Balance Dynamic Sitting - Level of Assistance: 7: Independent Static Standing Balance Static Standing - Level of Assistance: 6: Modified independent (Device/Increase time) Dynamic Standing Balance Dynamic Standing - Level of Assistance: 5: Stand by assistance Extremity Assessment  RUE Assessment RUE Assessment: Exceptions to [facility] RUE AROM (degrees) RUE Overall AROM Comments: Patient continues to have decreased AROM throughout RUE, however patient is now able to functional actively use RUE during functional tasks such as brushing hair, donning shirt, and reaching for items above head. Elbow continues to be [facility] and wrist continues to be  [facility]. Active shoulder flextion =180* RUE PROM (degrees) RUE Overall PROM Comments: PROM is [facility] RUE Strength RUE Overall Strength Comments: Patient with 3+/5 strenth throughout shoulder and 4/5 for elbow strength LUE Assessment LUE Assessment: Exceptions to WFL LUE AROM (degrees) LUE Overall AROM Comments: Patient continues to have decreased AROM thorughout LUE, however patient is not able to  functionall actively use LUE during functional tasks such as brushing hair, donning shirt, and reaching for items above head. Elbow continues to be [facility] and wrist continues to be [facility]. Active shoulder flexion =180*  LUE PROM (degrees) LUE Overall PROM Comments: PROM is WFL LUE Strength LUE Overall Strength Comments: 3/5 overall shoulder strength, 3+/5 overall elbow strength RLE Assessment RLE Assessment: Exceptions to [facility] RLE Strength RLE Overall Strength Comments: grossly WFL (decreased muscular endurance) LLE Assessment LLE Assessment: Exceptions to [facility] LLE Strength LLE Overall Strength Comments: grossly WFL (decreased muscular endurance)  See FIM for current functional status  [name] [facility] 03/20/2012, 12:49 PM

## 2012-03-20 NOTE — Progress Notes (Signed)
Physical Therapy Session Note  Patient Details  Name: Frank Brock MRN: [id number] Date of Birth: [birth year]  Today's Date: 03/20/2012 Time: 0930-1010 Time Calculation (min): 40 min  Short Term Goals: Week 3:  PT Short Term Goal 1 (Week 3): = LTGs  Skilled Therapeutic Interventions/Progress Updates:    Treatment focused on transfers, gait training with RW, and reviewing HEP for functional strengthening/balance. Handouts issued and reviewed with pt who verbalized understanding. Able to gait with S using RW. Family education planned for this afternoon. Pt made mod I in room for basic w/c <-> bed transfers.   Therapy Documentation Precautions:  Precautions Precautions: Cervical;Fall Required Braces or Orthoses:  (soft collar brace discharged) Cervical Brace: Applied in sitting position Restrictions Weight Bearing Restrictions: No Pain:  Reports pain is managed.   Locomotion : Ambulation Ambulation/Gait Assistance: 5: Supervision   See FIM for current functional status  Therapy/Group: Individual Therapy  [name] [facility] 03/20/2012, 12:15 PM

## 2012-03-20 NOTE — Progress Notes (Signed)
Social Work Patient ID: Frank Brock, male   DOB: 10-May-1960, 52 y.o.   MRN: [id number]  Have had several discussions with pt following team conference.  Pt reports he feels he will be ready for 2/28 discharge. Has questions regarding HH services and IV administration.  Have had rep from [facility] follow up with him (and to return this afternoon when family here as well) to review processes at home.  Pt has gotten all recommended DME from a family member, however, I will be getting a cushion for w/c.  Continue to follow.  Ayan Yankey, LCSW

## 2012-03-21 DIAGNOSIS — N319 Neuromuscular dysfunction of bladder, unspecified: Secondary | ICD-10-CM

## 2012-03-21 DIAGNOSIS — G8250 Quadriplegia, unspecified: Secondary | ICD-10-CM

## 2012-03-21 DIAGNOSIS — J9600 Acute respiratory failure, unspecified whether with hypoxia or hypercapnia: Secondary | ICD-10-CM

## 2012-03-21 DIAGNOSIS — G061 Intraspinal abscess and granuloma: Secondary | ICD-10-CM

## 2012-03-21 DIAGNOSIS — L89109 Pressure ulcer of unspecified part of back, unspecified stage: Secondary | ICD-10-CM

## 2012-03-21 LAB — CBC
MCV: 86.3 fL (ref 78.0–100.0)
Platelets: 418 10*3/uL — ABNORMAL HIGH (ref 150–400)
RBC: 3.58 MIL/uL — ABNORMAL LOW (ref 4.22–5.81)
RDW: 13.9 % (ref 11.5–15.5)
WBC: 7.6 10*3/uL (ref 4.0–10.5)

## 2012-03-21 MED ORDER — TAMSULOSIN HCL 0.4 MG PO CAPS: 0.4000 mg | ORAL_CAPSULE | Freq: Two times a day (BID) | ORAL | Status: AC

## 2012-03-21 MED ORDER — SACCHAROMYCES BOULARDII 250 MG PO CAPS: 250.0000 mg | ORAL_CAPSULE | Freq: Two times a day (BID) | ORAL | Status: AC

## 2012-03-21 MED ORDER — HEPARIN SOD (PORK) LOCK FLUSH 100 UNIT/ML IV SOLN
250.0000 [IU] | Freq: Every day | INTRAVENOUS | Status: DC
  Filled 2012-03-21 (×2): qty 3

## 2012-03-21 MED ORDER — AMLODIPINE BESYLATE 10 MG PO TABS: 10.0000 mg | ORAL_TABLET | Freq: Every day | ORAL | Status: DC

## 2012-03-21 MED ORDER — HEPARIN SOD (PORK) LOCK FLUSH 100 UNIT/ML IV SOLN
250.0000 [IU] | INTRAVENOUS | Status: DC | PRN
  Administered 2012-03-21: 250 [IU]
  Filled 2012-03-21: qty 3

## 2012-03-21 MED ORDER — COLLAGENASE 250 UNIT/GM EX OINT: TOPICAL_OINTMENT | Freq: Every day | CUTANEOUS | Status: DC

## 2012-03-21 MED ORDER — RIVAROXABAN 20 MG PO TABS: 20.0000 mg | ORAL_TABLET | Freq: Every day | ORAL | Status: DC

## 2012-03-21 MED ORDER — VANCOMYCIN HCL IN DEXTROSE 1-5 GM/200ML-% IV SOLN: 1000.0000 mg | Freq: Two times a day (BID) | INTRAVENOUS | Status: DC

## 2012-03-21 MED ORDER — TRAMADOL HCL 50 MG PO TABS: 50.0000 mg | ORAL_TABLET | Freq: Four times a day (QID) | ORAL | Status: DC | PRN

## 2012-03-21 MED ORDER — OXYCODONE HCL 10 MG PO TABS: 5.0000 mg | ORAL_TABLET | Freq: Four times a day (QID) | ORAL | Status: DC | PRN

## 2012-03-21 MED ORDER — METHOCARBAMOL 500 MG PO TABS: 500.0000 mg | ORAL_TABLET | Freq: Four times a day (QID) | ORAL | Status: DC | PRN

## 2012-03-21 MED ORDER — SENNOSIDES-DOCUSATE SODIUM 8.6-50 MG PO TABS: 1.0000 | ORAL_TABLET | Freq: Every day | ORAL | Status: AC

## 2012-03-21 NOTE — Plan of Care (Signed)
Problem: SCI BLADDER ELIMINATION Goal: RH STG MANAGE BLADDER WITH ASSISTANCE STG Manage Bladder With max Assistance  Outcome: Completed/Met Date Met:  03/21/12 Coude catheter will remain in place when discharged

## 2012-03-21 NOTE — Progress Notes (Signed)
Physical Therapy Wound Treatment Patient Details  Name: Frank Brock MRN: [id number] Date of Birth: 06/08/1960  Today's Date: 03/21/2012 Time: 4098-1191 Time Calculation (min): 24 min  Subjective  Subjective: Pt reports very dull pain if any Patient and Family Stated Goals: heal wound Prior Treatments: santyl, hydrotherapy, hydrogel, allevyn  Pain Score:    Wound Assessment  Pressure Ulcer 02/13/12 Unstageable - Full thickness tissue loss in which the base of the ulcer is covered by slough (yellow, tan, gray, green or brown) and/or eschar (tan, brown or black) in the wound bed. 2cm x 3cm unstageable PU, pink edges with yello (Active)  State of Healing Early/partial granulation 03/21/2012  1:00 PM  Site / Wound Assessment Clean;Dry;Yellow 03/21/2012  1:00 PM  % Wound base Red or Granulating 0% 03/21/2012  1:00 PM  % Wound base Yellow 50% 03/21/2012  1:00 PM  % Wound base Other (Comment) 50% 03/14/2012 10:30 PM  Peri-wound Assessment Intact 03/21/2012  1:00 PM  Wound Length (cm) 1.1 cm 03/17/2012  4:48 PM  Wound Width (cm) 0.9 cm 03/17/2012  4:48 PM  Wound Depth (cm) 0.1 cm 03/17/2012  4:48 PM  Margins Unattacted edges (unapproximated) 03/21/2012  1:00 PM  Drainage Amount None 03/21/2012  1:00 PM  Drainage Description Serous 03/21/2012  1:00 PM  Treatment Debridement (Selective);Hydrotherapy (Pulse lavage);Other (Comment) 03/21/2012  1:00 PM  Dressing Type Other (Comment) 03/21/2012  1:00 PM  Dressing Changed 03/21/2012  1:00 PM     Incision 02/11/12 Chest Left (Active)  Site / Wound Assessment Clean;Dry 03/21/2012  8:00 AM  Incision Length (cm) 7 cm 03/11/2012  7:40 PM  Margins Unattacted edges (unapproximated) 03/19/2012  5:00 PM  Closure None 03/11/2012  7:40 PM  Drainage Amount None 03/20/2012  7:53 PM  Drainage Description Serous 02/25/2012 11:00 PM  Treatment Cleansed 03/11/2012  7:40 PM  Dressing Type Hydrogel;Gauze (Comment) 03/21/2012  8:00 AM  Dressing Changed 03/21/2012  8:00 AM      Incision 02/14/12 Neck Posterior (Active)  Site / Wound Assessment Clean;Dry 03/21/2012  8:00 AM  Margins Attached edges (approximated) 03/21/2012  8:00 AM  Closure None 03/19/2012  8:00 AM  Drainage Amount None 03/19/2012  8:00 AM  Drainage Description Sanguineous 02/26/2012  9:00 AM  Dressing Type Adhesive strips 03/19/2012  7:50 PM  Dressing Clean;Dry 03/09/2012  8:50 PM   Hydrotherapy Pulsed lavage therapy - wound location: sacrum Pulsed Lavage with Suction (psi): 12 psi Pulsed Lavage with Suction - Normal Saline Used: 1000 mL Pulsed Lavage Tip: Tip with splash shield Selective Debridement Selective Debridement - Location: sacrum Selective Debridement - Tools Used: Forceps;Scalpel;Scissors Selective Debridement - Tissue Removed: yellow eschar/slough   Wound Assessment and Plan  Wound Therapy - Assess/Plan/Recommendations Wound Therapy - Clinical Statement: WOC nurse provided silver nitrate to edges, santyl applied to wound, dry dressing in place. Pt to d/c home. Wound Therapy - Functional Problem List: decr skin integrity with incr risk of infection and limiting sitting tolerance Factors Delaying/Impairing Wound Healing: Altered sensation;Incontinence;Immobility Hydrotherapy Plan: Debridement;Dressing change;Patient/family education;Pulsatile lavage with suction Wound Therapy - Frequency: 6X / week Wound Therapy - Follow Up Recommendations: Home health RN  Wound Therapy Goals- Improve the function of patient's integumentary system by progressing the wound(s) through the phases of wound healing (inflammation - proliferation - remodeling) by: Decrease Necrotic Tissue to: 50% Decrease Necrotic Tissue - Progress: Met Increase Granulation Tissue to: 50 Increase Granulation Tissue - Progress: Met Decrease Length/Width/Depth by (cm): 0.1 Decrease Length/Width/Depth - Progress: Met Patient/Family will be able  to : verbalize plan for dressing changes for d/c home [facility] vs family to  do--TBD) Goals/treatment plan/discharge plan were made with and agreed upon by patient/family: Yes Time For Goal Achievement: 7 days Wound Therapy - Potential for Goals: Excellent  Goals will be updated until maximal potential achieved or discharge criteria met.  Discharge criteria: when goals achieved, discharge from hospital, MD decision/surgical intervention, no progress towards goals, refusal/missing three consecutive treatments without notification or medical reason.  GP     Fabio Asa 03/21/2012, 1:49 PM  Charlotte Crumb, PT DPT  [phone]

## 2012-03-21 NOTE — Progress Notes (Signed)
Patient ID: Frank Brock, male   DOB: 09-Nov-1960, 52 y.o.   MRN: [id number] Subjective/Complaints: No new issues. Outing went without any problems  A 12 point review of systems has been performed and if not noted above is otherwise negative.   Objective: Vital Signs: Blood pressure 128/89, pulse 72, temperature 98 F (36.7 C), temperature source Oral, resp. rate 19, weight 69.4 kg (153 lb), SpO2 100.00%. No results found.  Recent Labs  03/21/12 0535  WBC 7.6  HGB 10.1*  HCT 30.9*  PLT 418*    Recent Labs  03/20/12 1010  NA 135  K 4.3  CL 98  GLUCOSE 100*  BUN 17  CREATININE 0.82  CALCIUM 9.8   CBG (last 3)  No results found for this basename: GLUCAP,  in the last 72 hours  Wt Readings from Last 3 Encounters:  03/21/12 69.4 kg (153 lb)  02/25/12 76.2 kg (167 lb 15.9 oz)  02/25/12 76.2 kg (167 lb 15.9 oz)    Physical Exam:  Constitutional: He is oriented to person, place, and time. He appears well-developed. He has a sickly appearance.  Cardiovascular: Normal rate and regular rhythm.  Pulmonary/Chest: Effort normal. He has rales in the left middle field.  Abdominal: Soft. Bowel sounds are normal.  Musculoskeletal: He exhibits no tenderness.  Minimal edema left forearm. Bilateral heels boggy--non tender.  Neurological: He is alert and oriented to person, place, and time. A bit distracted but generally appropriate  decreased sensation rectally. Decreased sensation to LT in both hands and feet.  DTR's 1+, no resting tone. Good voice/phonation, cognition is generally intact.  MOTOR TESTING:  Muscle Right Left  Deltoid 4   4 Triceps 4  4 Biceps 4  4 Wrist ext 4 4 HI 4    4 HF 4    4  KE 4     4 ADF 4+ 4+  APF 4+ 4 + Skin:  Posterior neck incision clean and dry with steri strips in place. Lower chest wall with sutures anteriorly. Upper left chest wall with VAC in place and sealed. Sacrum with small area fibronecrotic tissue near sacrum which has more  fibronecrotic tissue at present.   Assessment/Plan: 1. 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.Stable for D/C today F/u PCP in 1-2 weeks F/u PM&R 3 weeks F/u urology F/u Neurosurgery Dr Venetia Maxon See D/C summary See D/C instructionsFIM: FIM - Bathing Bathing Steps Patient Completed: Chest;Right Arm;Left Arm;Abdomen;Front perineal area;Buttocks;Right upper leg;Left upper leg;Right lower leg (including foot);Left lower leg (including foot) Bathing: 6: Assistive device (Comment)  FIM - Upper Body Dressing/Undressing Upper body dressing/undressing steps patient completed: Thread/unthread right sleeve of pullover shirt/dresss;Thread/unthread left sleeve of pullover shirt/dress;Put head through opening of pull over shirt/dress;Pull shirt over trunk Upper body dressing/undressing: 7: Complete Independence: No helper FIM - Lower Body Dressing/Undressing Lower body dressing/undressing steps patient completed: Thread/unthread right pants leg;Thread/unthread left pants leg;Pull pants up/down;Don/Doff right sock;Don/Doff left sock;Don/Doff right shoe;Don/Doff left shoe Lower body dressing/undressing: 5: Supervision: Safety issues/verbal cues  FIM - Toileting Toileting steps completed by patient: Adjust clothing prior to toileting;Performs perineal hygiene;Adjust clothing after toileting Toileting: 6: Assistive device: No helper  FIM - Diplomatic Services operational officer Devices: Elevated toilet seat;Grab bars Toilet Transfers: 6-Assistive device: No helper (mod I w/c transfers, supervision ambulating with RW)  FIM - Bed/Chair Transfer Bed/Chair Transfer Assistive Devices: Arm rests Bed/Chair Transfer: 5: Supine > Sit: Supervision (verbal cues/safety issues);5: Sit >  Supine: Supervision (verbal cues/safety issues);5: Chair or W/C > Bed: Supervision (verbal cues/safety issues)  FIM - Locomotion:  Wheelchair Distance: 200 Locomotion: Wheelchair: 6: Travels 150 ft or more, turns around, maneuvers to table, bed or toilet, negotiates 3% grade: maneuvers on rugs and over door sills independently FIM - Locomotion: Ambulation Locomotion: Ambulation Assistive Devices: Designer, industrial/product Ambulation/Gait Assistance: 5: Supervision Locomotion: Ambulation: 5: Travels 150 ft or more with supervision/safety issues  Comprehension Comprehension Mode: Auditory Comprehension: 7-Follows complex conversation/direction: With no assist  Expression Expression Mode: Verbal Expression: 7-Expresses complex ideas: With no assist  Social Interaction Social Interaction: 7-Interacts appropriately with others - No medications needed.  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 Pain Management: Cervical collar prn for comfort.   3. Mood: Seems to have a good outlook. No active issues at present.  4. Neuropsych: This patient is capable of making decisions on his/her own behalf.  5. Disseminated MRSA: S/P evacuation of abscess and minithorocotomy Vancomycin 6 weeks post-op from (1/23)  -hh abx arranged  6. Anemia due to infection/crutical illness: Added iron supplement.  7. Hyponatremia: stable/improved (132)- 8. Abnormal LFTs: Likely due to sepsis. No GI symptoms. Stable to improved today 9. Neurogenic bowel/bladder-   -actually a little constipated--add senna hs  -probiotic  added  -may have prostate hypertrophy  As well.  -increased flomax to bid- continue even with foley in  - foley inplace--otupt urology appt  10. Wound care: wet to dry to chest wound.   -continue santyl/moist dressing to sacral wound daily-   -hydrotherapy/debridement to continue up until dc  -pt is directing pressure relief, turning  -hh follow up of wound 11.  Insomnia: trazodone has been effective- he would like to use at home  LOS (Days) 25 A FACE TO FACE EVALUATION WAS PERFORMED  Claudette Laws E 03/21/2012 9:01 AM

## 2012-03-21 NOTE — Plan of Care (Signed)
Problem: RH PAIN MANAGEMENT Goal: RH STG PAIN MANAGED AT OR BELOW PT'S PAIN GOAL Less or equal to 3  Outcome: Completed/Met Date Met:  03/21/12 Medicated as needed. Pain controlled

## 2012-03-21 NOTE — Consult Note (Signed)
WOC follow up  Wound type: left upper chest-surgical- essentially healed. OK to shower area and cover with dry bandage/bandaid; Unstageable right upper gluteal fold  Measurement: see PT notes for measurement of the buttock wound Wound bed: Unstagealbe pressure ulcer, still with 100% yellow slough base. I have utilized silver nitrate to wound edges to try to tx epibole and encourage reepithelialization.   Drainage (amount, consistency, odor) minimal  Periwound: intact Dressing procedure/placement/frequency: I have instructed pt to remove both dressings from the chest and the buttock and shower daily, scrubbing the buttock wound and then dry.  Apply Santyl to the wound bed only of the buttock and cover with dry dressing/bandaid.  Limit pressure to the buttock wound as much as possible. He verbalized understanding of dressing and pressure redistribution needs.  Re consult if needed, will not follow at this time. Thanks  Dinita Migliaccio [employer], CWOCN [phone])

## 2012-03-21 NOTE — Progress Notes (Signed)
Discharge summary [phone]

## 2012-03-21 NOTE — Progress Notes (Signed)
Pt discharged with family to home at 1130. Discharge instructions given by Marissa Nestle, PA with verbal understanding. All belongings with pt. No further questions at this time.

## 2012-03-21 NOTE — Progress Notes (Signed)
Social Work  Discharge Note  The overall goal for the admission was met for:   Discharge location: Yes - home with mother and step-father (ex-wife also to assist)  Length of Stay: Yes - 25 days  Discharge activity level: Yes - minimal assistance  Home/community participation: Yes  Services provided included: MD, RD, PT, OT, RN, TR, Pharmacy and SW  Financial Services: Medicare  Follow-up services arranged: Home Health: RN, PT, OT via [facility] and DME: Vonna Kotyk 2 cushion via [facility]  Comments (or additional information):  Patient/Family verbalized understanding of follow-up arrangements: Yes  Individual responsible for coordination of the follow-up plan: patient  Confirmed correct DME delivered: Doran Nestle 03/21/2012    Marea Reasner

## 2012-03-22 NOTE — Discharge Summary (Signed)
NAMEMORSE, BRUEGGEMANN NO.:  [id number]  MEDICAL RECORD NO.:  [id number]  LOCATION:  4035                         FACILITY:  [facility]  PHYSICIAN:  Ranelle Oyster, M.D.DATE OF BIRTH:  1961-01-10  DATE OF ADMISSION:  02/25/2012 DATE OF DISCHARGE:  03/21/2012                              DISCHARGE SUMMARY   DISCHARGE DIAGNOSES: 1. 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 2. Anemia of critical illness. 3. Disseminated MRSA. 4. Urinary retention. 5. Hyponatremia. 6. Abnormal LFTs. 7. Left upper extremity subclavian and axillary vein deep vein     thrombosis.  HISTORY OF PRESENT ILLNESS:  Mr. Frank Brock is a 52 year old male with history of prior CVA, hypertension, hyperlipidemia, heavy alcohol use who was admitted via [facility] with small lesion in the middle finger of the left hand as well as left chest pain with hypoxemia and left upper lobe mass.  Workup done revealed left-sided chest wall abscess communicating with adjacent intrathoracic lung and he underwent emergent cardiothoracic surgery with exploration of chest wound, minithoracotomy for evacuation of chest wall abscess and intrapleural abscess on February 10, 2012, by Dr. Tyrone Sage.  Hospital course has been complicated by DVT in left upper subclavian and axillary vein.  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  Dr. Venetia Maxon was consulted, and the patient underwent posterior cervical laminectomy for evacuation of abscess on the same date.  Infectious Disease was consulted for input on antibiotic use and recommended 6 weeks of antibiotic therapy from the date of his I and D. VAC dressing changes ongoing to his chest wound.  He is currently on treatment dose Lovenox.  He is having  some improvement in upper extremity strength.  The therapies are ongoing, and I am working on core activation at the edge of bed as well as edema control of bilateral upper extremity.  He continues to be limited by neck pain as well as asymptomatic bradycardia.  Therapy team recommended CIR, and the patient was admitted for further therapies.  PAST MEDICAL HISTORY:  Hypercholesterolemia, stroke secondary to hypertensive crisis.  FUNCTIONAL HISTORY:  The patient was independent and active prior to admission.  FUNCTIONAL STATUS:  The patient required max to total assist for bed mobility.  A +2 total assist due to 10% for sit to stand transfers. Ambulation not tested.  He required +1 total assist for upper body care, +2 total assist for lower body care.  I was working on core activation at edge of bed.  RECENT LABORATORY DATA:  Check of lytes revealed sodium 135, potassium 4.3, chloride 98, CO2 26, BUN 17, creatinine 0.82, glucose 100.  CBC revealed hemoglobin 10.1, hematocrit 30.9, white count 7.6, platelets 418.  Last vanc level of March 19, 2012, was at 22.9.  HOSPITAL COURSE:  Mr. Frank Brock was admitted to Rehab on February 25, 2012, for inpatient therapies to consist of PT, OT at least 3 hours 5 days a week.  Past admission, physiatrist, rehab, RN, and therapy team have  worked together to provide Animator care.  Rehab RN has worked with the patient on bowel and bladder program as well as wound care monitoring.  Walk nurse has followed up for input on the patient's chest wall wound as well as sacral decubitus.  His chest wall wound has been healing well and vac was discontinued.  At the time of discharge, he only requires dry dressing to this area.  His sacral decubitus was debrided x1.  He has continued to develop yellow eschar requiring hydrotherapy.  This is decreased in size.  He is to continue using Santyl and damp-to-dry dressing  changes past discharge.  Pain control has been reasonable with use of OxyIR on q.i.d. basis.  Blood pressures have been monitored on b.i.d. basis and these have been well controlled.  P.o. intake has been good.  He has been continent of bowel.  He has had problems voiding. Voiding trials were attempted x2, but he was unable to void and was a very difficult cath despite use of coude cath.  Urine culture of 02/17 showed no growth.  Foley remains in place and the patient is to follow up with Urology past discharge.  He was changed over to Xarelto 20 mg per day for treatment of his DVT prior to discharge.  During the patient's stay in rehab, weekly team conferences were held to monitor the patient's progress, set goals, as well as discuss barriers to discharge.  The patient has had improvement in his quadriparesis.  He continues with decreased sensation in both hands and feet.  Upper extremities strength has improved to 4/5.  Lower extremities 4/5 at hip flexors, knee extensors, 4+ ankle dorsiflexion and plantar flexion.  He is showing improvement in activity tolerance, balance, as well as postural control.  He is modified independent at wheelchair level.  He is modified independent for transfers.  He is able to ambulate with rolling walker and close supervision at 200 feet x2.  He is able to propel his wheelchair independently.  He is able to navigate 4-inch curb with close supervision.  OT has worked with the patient on self-care tasks.  The patient is at modified independent.  Follow up in lower body bathing.  He does require some supervision for lower body dressing. Family education has been done with the patient's mother regarding IV care.  Further followup home health therapies to continue past discharge.  DISCHARGE MEDICATIONS: 1. Santyl to sacral area daily. 2. Robaxin 500 mg p.o. q.6 hours p.r.n. spasms. 3. Xarelto 20 mg p.o. per day. 4. Florastor 250 mg p.o. b.i.d. for at least  2 weeks. 5. Senokot-S 1 p.o. at bedtime. 6. Flomax 0.4 mg p.o. b.i.d. 7. Vancomycin 1 g q.12 hours. 8. Oxycodone 10 mg half to one p.o. q.6 hours p.r.n. pain #100     prescription. 9. Tramadol 50 mg p.o. q.6 hours p.r.n. moderate pain. 10.Tylenol 650 mg p.o. q.6 hours p.r.n. mild pain. 11.Amlodipine 10 mg p.o. per day. 12.MiraLax as needed.  DIET:  Regular.  ACTIVITY LEVEL:  As tolerated with supervision.  WOUND CARE:  Santyl with damp-to-dry dressing on buttocks.  SPECIAL INSTRUCTIONS:  No alcohol.  [facility] to provide PT, OT, and RN and boost every 20 minutes when in chair, __________ in bed for pressure relief.  IV antibiotics to continue through March 6th. Final decisions pending appointment with Dr. Drue Second on the 5th.  Routine Foley care.  FOLLOWUP:  The patient to follow up with Dr. Bjorn Pippin on  March 24th at 8:15 a.m.  Follow up with Dr. Faith Rogue, April 15, 2012, at 9:30. Follow up with Dr. [name] on March 31, 2012, at 11 a.m.  Follow up with Dr. Judyann Munson on March 26, 2012, at 9:45 a.m., call in the next 1-2 days for followup appointments with Dr. Tyrone Sage and Dr. Venetia Maxon.     Delle Reining, P.A.   ______________________________ Ranelle Oyster, M.D.    PL/MEDQ  D:  03/21/2012  T:  03/22/2012  Job:  [id number]  cc:   Danae Orleans. Venetia Maxon, M.D. Excell Seltzer. Annabell Howells, M.D. Judyann Munson, MD Sheliah Plane, MD Dibas Docia Chuck, MD

## 2012-03-25 ENCOUNTER — Telehealth: Payer: Self-pay

## 2012-03-25 NOTE — Telephone Encounter (Signed)
[facility] with [facility] called to let us know there has been a delay getting patients OT.  They will start tomorrow.

## 2012-03-26 ENCOUNTER — Encounter: Payer: Self-pay | Admitting: Internal Medicine

## 2012-03-26 ENCOUNTER — Ambulatory Visit (INDEPENDENT_AMBULATORY_CARE_PROVIDER_SITE_OTHER): Payer: Medicare HMO | Admitting: Internal Medicine

## 2012-03-26 VITALS — BP 130/81 | HR 108 | Temp 97.1°F | Wt 168.0 lb

## 2012-03-26 DIAGNOSIS — A4902 Methicillin resistant Staphylococcus aureus infection, unspecified site: Secondary | ICD-10-CM

## 2012-03-26 DIAGNOSIS — G062 Extradural and subdural abscess, unspecified: Secondary | ICD-10-CM

## 2012-03-26 LAB — CBC WITH DIFFERENTIAL/PLATELET
Basophils Absolute: 0 10*3/uL (ref 0.0–0.1)
Basophils Relative: 0 % (ref 0–1)
HCT: 30 % — ABNORMAL LOW (ref 39.0–52.0)
Hemoglobin: 10 g/dL — ABNORMAL LOW (ref 13.0–17.0)
Lymphocytes Relative: 8 % — ABNORMAL LOW (ref 12–46)
MCHC: 33.3 g/dL (ref 30.0–36.0)
Monocytes Absolute: 1 10*3/uL (ref 0.1–1.0)
Monocytes Relative: 6 % (ref 3–12)
Neutro Abs: 12.9 10*3/uL — ABNORMAL HIGH (ref 1.7–7.7)
Neutrophils Relative %: 79 % — ABNORMAL HIGH (ref 43–77)
WBC: 16.3 10*3/uL — ABNORMAL HIGH (ref 4.0–10.5)

## 2012-03-26 LAB — BASIC METABOLIC PANEL
Chloride: 103 mEq/L (ref 96–112)
Glucose, Bld: 122 mg/dL — ABNORMAL HIGH (ref 70–99)
Potassium: 4.7 mEq/L (ref 3.5–5.3)
Sodium: 135 mEq/L (ref 135–145)

## 2012-03-26 LAB — SEDIMENTATION RATE: Sed Rate: 36 mm/hr — ABNORMAL HIGH (ref 0–16)

## 2012-03-26 NOTE — Progress Notes (Signed)
[facility] CLINIC NOTE  RFV: hospital follow up for MRSA bacteremia and epidural abscess Subjective:    Patient ID: Frank Brock, male    DOB: 1960/11/22, 52 y.o.   MRN: [id number]  HPI Mr. Simonis is 52 yo Male who was hospitalized in mid January for disseminated MRSA infection including bacteremia,pulmonary abscess, chest wall abscess and cervical epidural abscess with acute quadriplegia s/p POSTERIOR CERVICAL LAMINECTOMY FOR EPIDURAL ABSCESS  from C 3- C 7 on 1/23. He is on his final day of IV vancomycin. He states that he has noticed having diffuse erythematous rash, some swelling to hands, and skin peeling since he has been on antibiotics. He notices the rash worsening with every infusion of IV antibiotics. It is unclear why he has not called to let us know of these symptoms. He denies any lip swelling, throat swelling, or intense pruritic rash. He denies any difficulties with his picc line. He was recently discharged from the rehab and doing well at home. He states that he suffers from constipation and also urinary retention.1 wks ago, attempted to have foley removed, but still having retention, thus is foley dependent; where he has urology follow up ion the 3/24th. He has noticed having blood in his urine in the last 2 days. Last night it was clear urine, but noted to have blood reappear more with activity. He contacted urologist today. For his constipation. He Stool softener, and metamucil. He denies fever,chills, nightsweats, no neck pain. No draining wounds. His chest wound and buttock ulcer are healing well, smaller in size.  No Known Allergies   Current Outpatient Prescriptions on File Prior to Visit  Medication Sig Dispense Refill  . acetaminophen (TYLENOL) 325 MG tablet Take 2 tablets (650 mg total) by mouth every 6 (six) hours as needed for fever.      Marland Kitchen amLODipine (NORVASC) 10 MG tablet Take 1 tablet (10 mg total) by mouth daily.  30 tablet  1  . collagenase (SANTYL) ointment Apply  topically daily.  15 g  0  . menthol-cetylpyridinium (CEPACOL) 3 MG lozenge Take 1 lozenge (3 mg total) by mouth as needed (sore throat).  100 tablet    . methocarbamol (ROBAXIN) 500 MG tablet Take 1 tablet (500 mg total) by mouth every 6 (six) hours as needed. For spasm.  60 tablet  1  . oxyCODONE 10 MG TABS Take 0.5-1 tablets (5-10 mg total) by mouth every 6 (six) hours as needed. For severe pain.  100 tablet  0  . polyethylene glycol (MIRALAX / GLYCOLAX) packet Take 17 g by mouth daily.      . Rivaroxaban (XARELTO) 20 MG TABS Take 1 tablet (20 mg total) by mouth daily with supper. Blood thinner  30 tablet  1  . saccharomyces boulardii (FLORASTOR) 250 MG capsule Take 1 capsule (250 mg total) by mouth 2 (two) times daily. probiotic  60 capsule  0  . senna-docusate (SENOKOT-S) 8.6-50 MG per tablet Take 1 tablet by mouth at bedtime. For constipation      . sodium chloride 0.9 % SOLN 250 mL with vancomycin 10 G SOLR Pharmacist to manage dosing      . tamsulosin (FLOMAX) 0.4 MG CAPS Take 1 capsule (0.4 mg total) by mouth 2 (two) times daily.  30 capsule  1  . traMADol (ULTRAM) 50 MG tablet Take 1 tablet (50 mg total) by mouth every 6 (six) hours as needed. For moderate pain.  30 tablet    . vancomycin (VANCOCIN) 1 GM/200ML SOLN  Inject 200 mLs (1,000 mg total) into the vein every 12 (twelve) hours.  4000 mL    . white petrolatum (VASELINE) GEL Apply 1 application topically as needed.       No current facility-administered medications on file prior to visit.   Active Ambulatory Problems    Diagnosis Date Noted  . H/O HTN 02/06/2012  . H/O hypercholesteremia 02/06/2012  . H/O stroke 02/06/2012  . Acute respiratory failure with hypoxia 02/08/2012  . Hypoxemia 02/08/2012  . Chest wall abscess 02/10/2012  . Lung abscess 02/10/2012  . Bacteremia due to methicillin resistant Staphylococcus aureus 02/11/2012  . S/P VATS (1/19) 02/11/2012  . DVT of upper extremity (deep vein thrombosis) 02/14/2012  .  Quadriplegia 02/14/2012  . Sepsis 02/19/2012  . 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 Hypokalemia 02/19/2012  . Anemia 02/19/2012  . Acute hyperglycemia 02/19/2012   Resolved Ambulatory Problems    Diagnosis Date Noted  . Lung mass 02/06/2012  . Altered mental status 02/08/2012  . Pain, chest wall 02/09/2012  . Bacteremia, 2/2 GPC clusters 02/09/2012  . Paraplegia 02/14/2012  . Dysphagia 02/19/2012   No Additional Past Medical History   History   Social History  . Marital Status: Legally Separated    Spouse Name: N/A    Number of Children: N/A  . Years of Education: N/A   Social History Main Topics  . Smoking status: Current Every Day Smoker -- 1.00 packs/day  . Smokeless tobacco: Never Used  . Alcohol Use: 1.8 oz/week    3 Cans of beer per week  . Drug Use: No  . Sexually Active: No   Other Topics Concern  . None   Social History Narrative  . None   family history includes Hypothyroidism in his mother.  Review of Systems  Constitutional: Negative for fever, chills, diaphoresis, activity change, appetite change, fatigue and unexpected weight change.  HENT: Negative for congestion, sore throat, rhinorrhea, sneezing, trouble swallowing and sinus pressure.  Eyes: Negative for photophobia and visual disturbance.  Respiratory: Negative for cough, chest tightness, shortness of breath, wheezing and stridor.  Cardiovascular: Negative for chest pain, palpitations and leg swelling.  Gastrointestinal: positive for constipation Negative for nausea, vomiting, abdominal pain, diarrhea, blood in stool, abdominal distention and anal bleeding.  Genitourinary:  hematuria, Musculoskeletal: Negative for myalgias, back pain, joint swelling, arthralgias and gait problem.  Skin: rash per hpi Neurological: Negative for dizziness, tremors, weakness and light-headedness.  Hematological: Negative for adenopathy. Does not bruise/bleed easily.  Psychiatric/Behavioral: Negative for  behavioral problems, confusion, sleep disturbance, dysphoric mood, decreased concentration and agitation.       Objective:   Physical Exam BP 130/81  Pulse 108  Temp(Src) 97.1 F (36.2 C) (Oral)  Wt 168 lb (76.204 kg)  BMI 24.11 kg/m2 Physical Exam  Constitutional: He is oriented to person, place, and time. He appears well-developed and well-nourished. No distress.  HENT: neck well-healed surgical site. Mouth/Throat: Oropharynx is clear and moist. No oropharyngeal exudate.  Cardiovascular: Normal rate, regular rhythm and normal heart sounds. Exam reveals no gallop and no friction rub.  No murmur heard.  Pulmonary/Chest: Effort normal and breath sounds normal. No respiratory distress. Right based exp wheezes.  Chest wall =left upper chest wall has healing wound, good granulation tissue 4 x 2cm elliptical lesion. Abdominal: Soft. Bowel sounds are normal. He exhibits no distension. There is no tenderness.  Lymphadenopathy:  He has no cervical adenopathy.  Buttock= 1.5cm circular pressure ulcer, good granulation tissue. Healing well. gu = foley  in place, hematuria noted in foley bag Neurological: He is alert and oriented to person, place, and time. Moves all appendages. 5/5 strength Skin: Skin is warm and dry. No rash noted. No erythema.  Psychiatric: He has a normal mood and affect. His behavior is normal.   Micro: 1/17 blood cx 2/2 + MRSA ( R oxa R erythro, otherwise sensitive) 1/18 blood cx 2/2 NGTD 1/19 abscess left chest wall +MRSA 1/23 abscess left wound + MRSA     Assessment & Plan:  MRSA epidural abscess/bacteremia = has finished 41 of 42 days of IV antibiotics. We will check sed rate and crp,bmp and cbc today. Will pull picc. Will start bactrim DS twice a day if inflammatory markers still elevated.   Constipation = continue with colace, miralax.   Hematuria = asked patient to check back with urology in 2 days if symptoms still persist. Appears to be related to foley  trauma  Drug rash = likely related to vancomycin. Anticipate it will improve since we are stopping antibiotics today. He will continue to have light exfoliation as part of sequelae of drug rash to arms. No need for steroids at this time.  More than 45 min spent with patient, including coordination of care  rtc in 4 wk  Cc: dr. Venetia Maxon

## 2012-03-26 NOTE — Progress Notes (Signed)
RN received verbal order to discontinue the patient's PICC line.  Patient identified with name and date of birth. PICC dressing removed, site unremarkable.    PICC line removed using sterile procedure @ 1110. PICC length equal to that noted in patient's hospital chart of 39 cm. Sterile petroleum gauze + sterile 4X4 applied to PICC site, pressure applied for 10 minutes and covered with Medipore tape as a pressure dressing. Patient tolerated procedure without complaints.  Patient instructed to limit use of arm for 1 hour. Patient instructed that the pressure dressing should remain in place for 24 hours. Patient verbalized understanding of these instructions.

## 2012-03-27 ENCOUNTER — Telehealth: Payer: Self-pay | Admitting: *Deleted

## 2012-03-27 LAB — C-REACTIVE PROTEIN: CRP: 1.5 mg/dL — ABNORMAL HIGH (ref <0.60)

## 2012-03-27 NOTE — Telephone Encounter (Signed)
Patient called and advised that he is still having some swelling. He advised his face and arms are more swollen and he has not had any IV antibiotic for 24 hours. He was wanting to know it this is normal and what to do if it is not. He was told to take Benadryl and is not sure how to take it and how long is to long to take it before it is a real problem. Advised him to take the Benadryl by the directions on the box but that if he starts to have any throat swelling or shortness of breath to go to the ED or call an ambulance. Advised in the meantime will send Dr Drue Second a message and call him back once she responds to it.

## 2012-03-31 ENCOUNTER — Telehealth: Payer: Self-pay

## 2012-03-31 NOTE — Telephone Encounter (Signed)
Diane with [facility] called to let us know that the patient has declined occupational therapy home evaluation.  He says he is doing well.

## 2012-04-08 ENCOUNTER — Telehealth: Payer: Self-pay

## 2012-04-08 NOTE — Telephone Encounter (Signed)
Is home health completed. Has PT discharged him? Where does he want to go?

## 2012-04-08 NOTE — Telephone Encounter (Signed)
Patient called and would like to start out patient physical therapy.  Please advise.

## 2012-04-09 NOTE — Telephone Encounter (Signed)
Left VM for Frank Brock to call us back and clarify if [facility] is complete and where he would like to go ( op rehab [facility]?)

## 2012-04-09 NOTE — Telephone Encounter (Signed)
Frank Brock says  That they were able to come out and do the assessment only, and then he had some kind of reaction to an antibiotic or something and was swelled up and could not do anything and by the time they called back to resume he was feeling well enough that he wants to just go to out patient, so a referral to [address] will be preferable to him. (They did not actually discharge but he probably just said don't come)

## 2012-04-09 NOTE — Telephone Encounter (Signed)
Orders sent

## 2012-04-15 ENCOUNTER — Encounter: Payer: Self-pay | Admitting: Physical Medicine & Rehabilitation

## 2012-04-15 ENCOUNTER — Encounter: Payer: Medicare HMO | Attending: Physical Medicine & Rehabilitation | Admitting: Physical Medicine & Rehabilitation

## 2012-04-15 VITALS — BP 108/71 | HR 90 | Resp 16 | Ht 72.0 in | Wt 163.2 lb

## 2012-04-15 DIAGNOSIS — L0211 Cutaneous abscess of neck: Secondary | ICD-10-CM | POA: Insufficient documentation

## 2012-04-15 DIAGNOSIS — G8250 Quadriplegia, unspecified: Secondary | ICD-10-CM

## 2012-04-15 DIAGNOSIS — F17200 Nicotine dependence, unspecified, uncomplicated: Secondary | ICD-10-CM | POA: Insufficient documentation

## 2012-04-15 DIAGNOSIS — M8618 Other acute osteomyelitis, other site: Secondary | ICD-10-CM

## 2012-04-15 DIAGNOSIS — R319 Hematuria, unspecified: Secondary | ICD-10-CM | POA: Insufficient documentation

## 2012-04-15 DIAGNOSIS — M542 Cervicalgia: Secondary | ICD-10-CM | POA: Insufficient documentation

## 2012-04-15 DIAGNOSIS — E785 Hyperlipidemia, unspecified: Secondary | ICD-10-CM | POA: Insufficient documentation

## 2012-04-15 DIAGNOSIS — K5900 Constipation, unspecified: Secondary | ICD-10-CM | POA: Insufficient documentation

## 2012-04-15 DIAGNOSIS — K592 Neurogenic bowel, not elsewhere classified: Secondary | ICD-10-CM

## 2012-04-15 DIAGNOSIS — M4620 Osteomyelitis of vertebra, site unspecified: Secondary | ICD-10-CM

## 2012-04-15 DIAGNOSIS — M4712 Other spondylosis with myelopathy, cervical region: Secondary | ICD-10-CM | POA: Insufficient documentation

## 2012-04-15 DIAGNOSIS — Z8673 Personal history of transient ischemic attack (TIA), and cerebral infarction without residual deficits: Secondary | ICD-10-CM | POA: Insufficient documentation

## 2012-04-15 DIAGNOSIS — N319 Neuromuscular dysfunction of bladder, unspecified: Secondary | ICD-10-CM

## 2012-04-15 MED ORDER — OXYCODONE HCL 10 MG PO TABS: 5.0000 mg | ORAL_TABLET | Freq: Four times a day (QID) | ORAL | Status: DC | PRN

## 2012-04-15 MED ORDER — TRAMADOL HCL 50 MG PO TABS: 50.0000 mg | ORAL_TABLET | Freq: Four times a day (QID) | ORAL | Status: DC | PRN

## 2012-04-15 MED ORDER — METHOCARBAMOL 500 MG PO TABS: 500.0000 mg | ORAL_TABLET | Freq: Four times a day (QID) | ORAL | Status: DC | PRN

## 2012-04-15 NOTE — Patient Instructions (Signed)
CONTINUE WITH GOOD POSTURE AND TECHINQUE.   CALL ME WITH ANY PROBLEMS OR QUESTIONS (#[phone]).  HAVE A GOOD DAY

## 2012-04-15 NOTE — Progress Notes (Signed)
Subjective:    Patient ID: Frank Brock, male    DOB: 08/14/1960, 52 y.o.   MRN: [id number]  HPI Mr. Needs is back regarding his cervical abscess. He was discharged from inpatient rehab about a month ago. He tells me he had an allergic reactionto abx which was ultimately stopped. Home health only came out briefly. He uses a walker outside home and for longer distances.   He's taking miralax for constipation. He is fairly regular now.   Urology has seen him. Urodynamics are scheduled next month.  He has had some mild bleeding in his urine. He is on ultram.   From a pain standpoint, his pain is manageable. He takes robaxin and tramadol bid and the oxycodone bid prn.   The buttock wound is slowly healing. He has stopped the santyl and is only using neosporin.    Pain Inventory Average Pain 5 Pain Right Now 7 My pain is dull and aching  In the last 24 hours, has pain interfered with the following? General activity 7 Relation with others 6 Enjoyment of life 5 What TIME of day is your pain at its worst? morning,evening Sleep (in general) Fair  Pain is worse with: walking and some activites Pain improves with: medication Relief from Meds: 6  Mobility walk with assistance use a walker ability to climb steps?  yes do you drive?  no use a wheelchair Do you have any goals in this area?  yes  Function employed # of hrs/week n/a  Neuro/Psych weakness numbness tingling spasms  Prior Studies Any changes since last visit?  no  Physicians involved in your care Any changes since last visit?  no   Family History  Problem Relation Age of Onset  . Hypothyroidism Mother    History   Social History  . Marital Status: Legally Separated    Spouse Name: N/A    Number of Children: N/A  . Years of Education: N/A   Social History Main Topics  . Smoking status: Current Every Day Smoker -- 1.00 packs/day  . Smokeless tobacco: Never Used  . Alcohol Use: 1.8 oz/week    3  Cans of beer per week  . Drug Use: No  . Sexually Active: No   Other Topics Concern  . None   Social History Narrative  . None   Past Surgical History  Procedure Laterality Date  . Inguinal hernia repair    . Irrigation and debridement abscess  02/10/2012    Procedure: IRRIGATION AND DEBRIDEMENT ABSCESS;  Surgeon: Delight Ovens, MD;  Location: [facility] OR;  Service: Thoracic;  Laterality: Left;  . Thoracotomy  02/10/2012    Procedure: MINI/LIMITED THORACOTOMY;  Surgeon: Delight Ovens, MD;  Location: [facility] OR;  Service: Thoracic;  Laterality: Left;  . Chest exploration  02/11/2012    Procedure: CHEST EXPLORATION;  Surgeon: Delight Ovens, MD;  Location: [facility] OR;  Service: Thoracic;  Laterality: Left;  re-exploration of left chest wall; with wound vac change  . Minor application of wound vac  02/11/2012    Procedure: MINOR APPLICATION OF WOUND VAC;  Surgeon: Delight Ovens, MD;  Location: [facility] OR;  Service: Thoracic;  Laterality: Left;  wound vac change  . Posterior cervical laminectomy for epidural abscess  02/14/2012    Procedure: POSTERIOR CERVICAL LAMINECTOMY FOR EPIDURAL ABSCESS;  Surgeon: Maeola Harman, MD;  Location: [facility] ORS;  Service: Neurosurgery;  Laterality: N/A;  Cervical Laminectomy  for Epidural Abscess   Past Medical History  Diagnosis  Date  . Hypercholesteremia 02/06/2012  . Stroke 02/06/2012    09/13 secondary to hypertensive crisis.   BP 108/71  Pulse 90  Resp 16  Ht 6' (1.829 m)  Wt 163 lb 3.2 oz (74.027 kg)  BMI 22.13 kg/m2  SpO2 100%      Review of Systems  Gastrointestinal: Positive for constipation.  Neurological: Positive for weakness and numbness.  All other systems reviewed and are negative.       Objective:   Physical Exam   General: Alert and oriented x 3, No apparent distress. Leg bag placed. Urine slightly blood tinged. HEENT: Head is normocephalic, atraumatic, PERRLA, EOMI, sclera anicteric, oral mucosa pink and moist, dentition  intact, ext ear canals clear,  Neck: Supple without JVD or lymphadenopathy Heart: Reg rate and rhythm. No murmurs rubs or gallops Chest: CTA bilaterally without wheezes, rales, or rhonchi; no distress Abdomen: Soft, non-tender, non-distended, bowel sounds positive. Extremities: No clubbing, cyanosis, or edema. Pulses are 2+ Skin: Clean and intact without signs of breakdown. Sacral wound healed.  Neuro: Pt is cognitively appropriate with normal insight, memory, and awareness. Cranial nerves 2-12 are intact. Sensory exam is diminished to LT and proprioception below the wrists and below the knees bilaterally. . Reflexes are 3+ in all 4's. Fine motor coordination is slightly diminished in all 4s. He ambulates with a wide based gait. He didn't appear at risk for falling. . No tremors. Motor function is grossly 5/5 right and 4+ LUE, 4 LLE.  Musculoskeletal: Full ROM, No pain with AROM or PROM in the neck, trunk, or extremities. Posture appropriate Psych: Pt's affect is appropriate. Pt is cooperative        Assessment & Plan:  1. 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  2.Pain syndrome due to the above  3. Neurogenic bladder.  4. Neurogenic bowel.       Plan: 1. Will advance to outpt PT/OT this week to work on gait, coordination. He still has impaired proprioception and light touch. 2. Urology follow up with [facility]. 3. Continue oxycodone, ultram, robaxin for pain control. I think we will be able to wean these over the next few months.  4. Continue with walker for longer dx.  5. Recommended follow up with NS over the next month. 6. Follow up in 2 months. 30 minutes of face to face patient care time were spent during this visit. All questions were encouraged and answered.

## 2012-04-17 ENCOUNTER — Other Ambulatory Visit: Payer: Self-pay | Admitting: Physical Medicine & Rehabilitation

## 2012-04-17 NOTE — Telephone Encounter (Signed)
Patient called requesting tramadol refill.  Tramadol refilled patient informed.

## 2012-04-18 ENCOUNTER — Ambulatory Visit: Payer: Medicare HMO | Attending: Physical Medicine & Rehabilitation | Admitting: Physical Therapy

## 2012-04-18 DIAGNOSIS — R269 Unspecified abnormalities of gait and mobility: Secondary | ICD-10-CM | POA: Insufficient documentation

## 2012-04-18 DIAGNOSIS — IMO0001 Reserved for inherently not codable concepts without codable children: Secondary | ICD-10-CM | POA: Insufficient documentation

## 2012-04-18 DIAGNOSIS — M6281 Muscle weakness (generalized): Secondary | ICD-10-CM | POA: Insufficient documentation

## 2012-04-22 ENCOUNTER — Ambulatory Visit: Payer: Medicare HMO | Attending: Physical Medicine & Rehabilitation | Admitting: Physical Therapy

## 2012-04-22 DIAGNOSIS — M6281 Muscle weakness (generalized): Secondary | ICD-10-CM | POA: Insufficient documentation

## 2012-04-22 DIAGNOSIS — M2560 Stiffness of unspecified joint, not elsewhere classified: Secondary | ICD-10-CM | POA: Insufficient documentation

## 2012-04-22 DIAGNOSIS — Z5189 Encounter for other specified aftercare: Secondary | ICD-10-CM | POA: Insufficient documentation

## 2012-04-22 DIAGNOSIS — R269 Unspecified abnormalities of gait and mobility: Secondary | ICD-10-CM | POA: Insufficient documentation

## 2012-04-24 ENCOUNTER — Encounter: Payer: Self-pay | Admitting: Internal Medicine

## 2012-04-24 ENCOUNTER — Ambulatory Visit (INDEPENDENT_AMBULATORY_CARE_PROVIDER_SITE_OTHER): Payer: Medicare HMO | Admitting: Internal Medicine

## 2012-04-24 VITALS — BP 117/77 | HR 81 | Temp 97.0°F | Wt 161.0 lb

## 2012-04-24 DIAGNOSIS — G062 Extradural and subdural abscess, unspecified: Secondary | ICD-10-CM

## 2012-04-24 LAB — CBC WITH DIFFERENTIAL/PLATELET
HCT: 34.2 % — ABNORMAL LOW (ref 39.0–52.0)
Hemoglobin: 11.2 g/dL — ABNORMAL LOW (ref 13.0–17.0)
Lymphs Abs: 1.8 10*3/uL (ref 0.7–4.0)
Monocytes Absolute: 0.6 10*3/uL (ref 0.1–1.0)
Monocytes Relative: 9 % (ref 3–12)
Neutro Abs: 4.2 10*3/uL (ref 1.7–7.7)
Neutrophils Relative %: 58 % (ref 43–77)
RBC: 4.13 MIL/uL — ABNORMAL LOW (ref 4.22–5.81)

## 2012-04-24 LAB — BASIC METABOLIC PANEL
BUN: 17 mg/dL (ref 6–23)
Chloride: 104 mEq/L (ref 96–112)
Glucose, Bld: 114 mg/dL — ABNORMAL HIGH (ref 70–99)
Potassium: 4.3 mEq/L (ref 3.5–5.3)

## 2012-04-24 NOTE — Progress Notes (Signed)
[facility] VISIT  RFV: hospital follow up for cervical epidural abscess Subjective:    Patient ID: Frank Brock, male    DOB: 1960-03-03, 52 y.o.   MRN: [id number]  HPI Mr. Perezperez is 52 yo Male who was hospitalized in mid January for disseminated MRSA infection including bacteremia,pulmonary abscess, chest wall abscess and cervical epidural abscess with acute quadriplegia s/p POSTERIOR CERVICAL LAMINECTOMY FOR EPIDURAL ABSCESS from C 3- C 7 on 1/23. HE was discharged on 6-8wks of IV vanco. on his final day of IV vancomycin, he states that he has noticed having diffuse erythematous rash, some swelling to hands, and skin peeling since he has been on antibiotics. He notices the rash worsening with every infusion of IV antibiotics. It is unclear why he has not called to let us know of these symptoms. He denies any lip swelling, throat swelling, or intense pruritic rash.  He denies fever,chills, nightsweats, no neck pain. No draining wounds. He is doing physical therapy, now walking without walker. Feels reange of motion limited on upper extremities. And still limited with neck Still has numbness to portions of back of arms, buttocks, perineal area. Rash completely resolved Urinary dysfunction from neuro injury.   Current Outpatient Prescriptions on File Prior to Visit  Medication Sig Dispense Refill  . amLODipine (NORVASC) 10 MG tablet Take 1 tablet (10 mg total) by mouth daily.  30 tablet  1  . methocarbamol (ROBAXIN) 500 MG tablet Take 1 tablet (500 mg total) by mouth every 6 (six) hours as needed. For spasm.  60 tablet  1  . Oxycodone HCl 10 MG TABS Take 0.5-1 tablets (5-10 mg total) by mouth every 6 (six) hours as needed. For severe pain.  100 tablet  0  . polyethylene glycol (MIRALAX / GLYCOLAX) packet Take 17 g by mouth daily.      . Rivaroxaban (XARELTO) 20 MG TABS Take 1 tablet (20 mg total) by mouth daily with supper. Blood thinner  30 tablet  1  . saccharomyces boulardii (FLORASTOR) 250  MG capsule Take 1 capsule (250 mg total) by mouth 2 (two) times daily. probiotic  60 capsule  0  . tamsulosin (FLOMAX) 0.4 MG CAPS Take 1 capsule (0.4 mg total) by mouth 2 (two) times daily.  30 capsule  1  . traMADol (ULTRAM) 50 MG tablet TAKE 1 TABLET 4 TIMES A DAY AS NEEDED FOR PAIN  90 tablet  0  . acetaminophen (TYLENOL) 325 MG tablet Take 2 tablets (650 mg total) by mouth every 6 (six) hours as needed for fever.      . collagenase (SANTYL) ointment Apply topically daily.  15 g  0  . menthol-cetylpyridinium (CEPACOL) 3 MG lozenge Take 1 lozenge (3 mg total) by mouth as needed (sore throat).  100 tablet    . senna-docusate (SENOKOT-S) 8.6-50 MG per tablet Take 1 tablet by mouth at bedtime. For constipation      . sodium chloride 0.9 % SOLN 250 mL with vancomycin 10 G SOLR Pharmacist to manage dosing      . vancomycin (VANCOCIN) 1 GM/200ML SOLN Inject 200 mLs (1,000 mg total) into the vein every 12 (twelve) hours.  4000 mL    . white petrolatum (VASELINE) GEL Apply 1 application topically as needed.       No current facility-administered medications on file prior to visit.   Active Ambulatory Problems    Diagnosis Date Noted  . H/O HTN 02/06/2012  . H/O hypercholesteremia 02/06/2012  . H/O stroke  02/06/2012  . Acute respiratory failure with hypoxia 02/08/2012  . Hypoxemia 02/08/2012  . Chest wall abscess 02/10/2012  . Lung abscess 02/10/2012  . Bacteremia due to methicillin resistant Staphylococcus aureus 02/11/2012  . S/P VATS (1/19) 02/11/2012  . DVT of upper extremity (deep vein thrombosis) 02/14/2012  . Quadriplegia 02/14/2012  . Sepsis 02/19/2012  . 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 Hypokalemia 02/19/2012  . Anemia 02/19/2012  . Acute hyperglycemia 02/19/2012  . Neurogenic bladder 04/15/2012  . Neurogenic bowel 04/15/2012   Resolved Ambulatory Problems    Diagnosis Date Noted  . Lung mass 02/06/2012  . Altered mental status 02/08/2012  . Pain, chest wall  02/09/2012  . Bacteremia, 2/2 GPC clusters 02/09/2012  . Paraplegia 02/14/2012  . Dysphagia 02/19/2012   No Additional Past Medical History      Review of Systems Appetite is depressed, thinks from medication. Otherwise 10 points review of systems is negative other than what is mentioned on hpi    Objective:   Physical Exam  BP 117/77  Pulse 81  Temp(Src) 97 F (36.1 C) (Oral)  Wt 161 lb (73.029 kg)  BMI 21.83 kg/m2 Physical Exam  Constitutional: He is oriented to person, place, and time. He appears well-developed and well-nourished. No distress.  HENT:  Mouth/Throat: Oropharynx is clear and moist. No oropharyngeal exudate.  Cardiovascular: Normal rate, regular rhythm and normal heart sounds. Exam reveals no gallop and no friction rub.  No murmur heard.  Pulmonary/Chest: Effort normal and breath sounds normal. No respiratory distress. He has no wheezes.  Abdominal: Soft. Bowel sounds are normal. He exhibits no distension. There is no tenderness.  Lymphadenopathy:  He has no cervical adenopathy.  Neurological: He is alert and oriented to person, place, and time.  Skin: Skin is warm and dry. No rash noted. No erythema.  Psychiatric: He has a normal mood and affect. His behavior is normal.   Labs: Lab Results  Component Value Date   CRP 1.5* 03/26/2012   Lab Results  Component Value Date   ESRSEDRATE 36* 03/26/2012        Assessment & Plan:  Epidural abscess - finished IV antibiotics, will check cbc with diff, sed rate, and crp to see that it is normalized. Since labs have normalized, he will not need further antibiotics  Addendum Lab Results  Component Value Date   ESRSEDRATE 19* 04/24/2012   Lab Results  Component Value Date   CRP 0.5 04/24/2012

## 2012-04-25 ENCOUNTER — Ambulatory Visit: Payer: Medicare HMO | Admitting: Occupational Therapy

## 2012-04-25 ENCOUNTER — Ambulatory Visit: Payer: Medicare HMO | Admitting: Physical Therapy

## 2012-04-29 ENCOUNTER — Ambulatory Visit: Payer: Medicare HMO | Admitting: Physical Therapy

## 2012-04-30 ENCOUNTER — Ambulatory Visit: Payer: Medicare HMO | Admitting: Occupational Therapy

## 2012-05-01 ENCOUNTER — Ambulatory Visit: Payer: Medicare HMO | Admitting: Physical Therapy

## 2012-05-01 ENCOUNTER — Ambulatory Visit: Payer: Medicare HMO | Admitting: *Deleted

## 2012-05-06 ENCOUNTER — Ambulatory Visit: Payer: Medicare HMO | Admitting: Physical Therapy

## 2012-05-07 ENCOUNTER — Ambulatory Visit: Payer: Medicare HMO | Admitting: Occupational Therapy

## 2012-05-08 ENCOUNTER — Ambulatory Visit: Payer: Medicare HMO | Admitting: Physical Therapy

## 2012-05-13 ENCOUNTER — Telehealth: Payer: Self-pay

## 2012-05-13 NOTE — Telephone Encounter (Signed)
Patient was calling for robaxin refill.  Pharmacy still has 1 refill.  Patient advised.

## 2012-05-14 ENCOUNTER — Ambulatory Visit: Payer: Medicare HMO | Admitting: Physical Therapy

## 2012-05-14 ENCOUNTER — Encounter: Payer: Medicare HMO | Admitting: Occupational Therapy

## 2012-05-15 ENCOUNTER — Ambulatory Visit: Payer: Medicare HMO | Admitting: Occupational Therapy

## 2012-05-16 ENCOUNTER — Ambulatory Visit: Payer: Medicare HMO | Admitting: Occupational Therapy

## 2012-05-16 ENCOUNTER — Ambulatory Visit: Payer: Medicare HMO | Admitting: Physical Therapy

## 2012-05-20 ENCOUNTER — Ambulatory Visit: Payer: Medicare HMO | Admitting: Occupational Therapy

## 2012-05-20 ENCOUNTER — Ambulatory Visit: Payer: Medicare HMO | Admitting: Physical Therapy

## 2012-05-22 ENCOUNTER — Ambulatory Visit: Payer: Medicare HMO | Attending: Physical Medicine & Rehabilitation

## 2012-05-22 ENCOUNTER — Ambulatory Visit: Payer: Medicare HMO | Admitting: Occupational Therapy

## 2012-05-22 DIAGNOSIS — M2560 Stiffness of unspecified joint, not elsewhere classified: Secondary | ICD-10-CM | POA: Insufficient documentation

## 2012-05-22 DIAGNOSIS — R269 Unspecified abnormalities of gait and mobility: Secondary | ICD-10-CM | POA: Insufficient documentation

## 2012-05-22 DIAGNOSIS — Z5189 Encounter for other specified aftercare: Secondary | ICD-10-CM | POA: Insufficient documentation

## 2012-05-22 DIAGNOSIS — M6281 Muscle weakness (generalized): Secondary | ICD-10-CM | POA: Insufficient documentation

## 2012-05-27 ENCOUNTER — Ambulatory Visit: Payer: Medicare HMO | Admitting: Occupational Therapy

## 2012-05-27 ENCOUNTER — Ambulatory Visit: Payer: Medicare HMO | Admitting: Physical Therapy

## 2012-05-29 ENCOUNTER — Ambulatory Visit: Payer: Medicare HMO | Admitting: Occupational Therapy

## 2012-05-29 ENCOUNTER — Ambulatory Visit: Payer: Medicare HMO | Admitting: Physical Therapy

## 2012-05-30 ENCOUNTER — Other Ambulatory Visit: Payer: Self-pay | Admitting: Urology

## 2012-05-30 DIAGNOSIS — N281 Cyst of kidney, acquired: Secondary | ICD-10-CM

## 2012-06-03 ENCOUNTER — Ambulatory Visit: Payer: Medicare HMO | Admitting: Physical Therapy

## 2012-06-03 ENCOUNTER — Ambulatory Visit: Payer: Medicare HMO | Admitting: Occupational Therapy

## 2012-06-04 ENCOUNTER — Encounter: Payer: Self-pay | Admitting: Physical Medicine & Rehabilitation

## 2012-06-04 ENCOUNTER — Encounter: Payer: Medicare HMO | Attending: Physical Medicine & Rehabilitation | Admitting: Physical Medicine & Rehabilitation

## 2012-06-04 VITALS — BP 128/88 | HR 67 | Resp 14 | Ht 70.0 in | Wt 161.0 lb

## 2012-06-04 DIAGNOSIS — Z5181 Encounter for therapeutic drug level monitoring: Secondary | ICD-10-CM | POA: Insufficient documentation

## 2012-06-04 DIAGNOSIS — G8250 Quadriplegia, unspecified: Secondary | ICD-10-CM

## 2012-06-04 DIAGNOSIS — N319 Neuromuscular dysfunction of bladder, unspecified: Secondary | ICD-10-CM | POA: Insufficient documentation

## 2012-06-04 DIAGNOSIS — K592 Neurogenic bowel, not elsewhere classified: Secondary | ICD-10-CM | POA: Insufficient documentation

## 2012-06-04 DIAGNOSIS — M8618 Other acute osteomyelitis, other site: Secondary | ICD-10-CM | POA: Insufficient documentation

## 2012-06-04 DIAGNOSIS — M4620 Osteomyelitis of vertebra, site unspecified: Secondary | ICD-10-CM

## 2012-06-04 DIAGNOSIS — Z79899 Other long term (current) drug therapy: Secondary | ICD-10-CM | POA: Insufficient documentation

## 2012-06-04 MED ORDER — OXYCODONE HCL 10 MG PO TABS: 5.0000 mg | ORAL_TABLET | Freq: Four times a day (QID) | ORAL | Status: AC | PRN

## 2012-06-04 MED ORDER — TRAMADOL HCL 50 MG PO TABS: 50.0000 mg | ORAL_TABLET | Freq: Four times a day (QID) | ORAL | Status: AC | PRN

## 2012-06-04 NOTE — Patient Instructions (Signed)
CALL ME WITH ANY PROBLEMS OR QUESTIONS (#[phone]).  HAVE A GOOD DAY  

## 2012-06-04 NOTE — Progress Notes (Signed)
Subjective:    Patient ID: Frank Brock, male    DOB: 22-May-1960, 52 y.o.   MRN: [id number]  HPI  Frank Brock is back regarding his abscess and C2-T1 central cord syndrome. Therapy is working on shoulder and scapular ROM. His shoulders are still limited. He is walking without a device. Therapy tends to exacerbate his pain. He has pain in his shoulders at night when he tries to sleep. His neck pain is less constant.   Urology removed his catheter and he's emptying fairly well with small amounts of retention. He is taking rapiflo currently. His bowels have been fairly continent although he doesn't get a lot of warning. He has had constipation at times as well. He is using the probiotic and miralax currently which seem to be working for him. Dr. Annabell Howells sent him for an MRI on his kidneys to follow up an abnormal finding. He sees dr. Venetia Maxon next week.     Pain Inventory Average Pain 6 Pain Right Now 4 My pain is intermittent, sharp and dull  In the last 24 hours, has pain interfered with the following? General activity 5 Relation with others 5 Enjoyment of life 5 What TIME of day is your pain at its worst? day and night Sleep (in general) Poor  Pain is worse with: some activites Pain improves with: rest and therapy/exercise Relief from Meds: 7  Mobility walk without assistance ability to climb steps?  yes do you drive?  yes  Function not employed: date last employed .  Neuro/Psych numbness  Prior Studies Any changes since last visit?  no  Physicians involved in your care Any changes since last visit?  no   Family History  Problem Relation Age of Onset  . Hypothyroidism Mother    History   Social History  . Marital Status: Legally Separated    Spouse Name: N/A    Number of Children: N/A  . Years of Education: N/A   Social History Main Topics  . Smoking status: Current Every Day Smoker -- 1.00 packs/day  . Smokeless tobacco: Never Used  . Alcohol Use: 1.8 oz/week    3 Cans of beer per week  . Drug Use: No  . Sexually Active: No   Other Topics Concern  . None   Social History Narrative  . None   Past Surgical History  Procedure Laterality Date  . Inguinal hernia repair    . Irrigation and debridement abscess  02/10/2012    Procedure: IRRIGATION AND DEBRIDEMENT ABSCESS;  Surgeon: Delight Ovens, MD;  Location: [facility] OR;  Service: Thoracic;  Laterality: Left;  . Thoracotomy  02/10/2012    Procedure: MINI/LIMITED THORACOTOMY;  Surgeon: Delight Ovens, MD;  Location: [facility] OR;  Service: Thoracic;  Laterality: Left;  . Chest exploration  02/11/2012    Procedure: CHEST EXPLORATION;  Surgeon: Delight Ovens, MD;  Location: [facility] OR;  Service: Thoracic;  Laterality: Left;  re-exploration of left chest wall; with wound vac change  . Minor application of wound vac  02/11/2012    Procedure: MINOR APPLICATION OF WOUND VAC;  Surgeon: Delight Ovens, MD;  Location: [facility] OR;  Service: Thoracic;  Laterality: Left;  wound vac change  . Posterior cervical laminectomy for epidural abscess  02/14/2012    Procedure: POSTERIOR CERVICAL LAMINECTOMY FOR EPIDURAL ABSCESS;  Surgeon: Maeola Harman, MD;  Location: [facility]UR[facility] ORS;  Service: Neurosurgery;  Laterality: N/A;  Cervical Laminectomy  for Epidural Abscess   Past Medical History  Diagnosis Date  .  Hypercholesteremia 02/06/2012  . Stroke 02/06/2012    09/13 secondary to hypertensive crisis.   BP 128/88  Pulse 67  Resp 14  Ht 5\' 10"  (1.778 m)  Wt 161 lb (73.029 kg)  BMI 23.1 kg/m2  SpO2 99%     Review of Systems  Neurological: Positive for numbness.  All other systems reviewed and are negative.       Objective:   Physical Exam General: Alert and oriented x 3, No apparent distress. Leg bag placed. Urine slightly blood tinged.  HEENT: Head is normocephalic, atraumatic, PERRLA, EOMI, sclera anicteric, oral mucosa pink and moist, dentition intact, ext ear canals clear,  Neck: Supple without JVD or  lymphadenopathy  Heart: Reg rate and rhythm. No murmurs rubs or gallops  Chest: CTA bilaterally without wheezes, rales, or rhonchi; no distress  Abdomen: Soft, non-tender, non-distended, bowel sounds positive.  Extremities: No clubbing, cyanosis, or edema. Pulses are 2+  Skin: Clean and intact without signs of breakdown. Sacral wound healed.  Neuro: Pt is cognitively appropriate with normal insight, memory, and awareness. Cranial nerves 2-12 are intact. Sensory exam is diminished to LT and proprioception below the wrists and below the knees bilaterally. . Reflexes are 3+ in all 4's. Fine motor coordination is slightly diminished in all 4s. He ambulates with some circumduction in the hips but otherwise much improved. . No tremors. Motor function is grossly 5/5 right and 5 LUE except for HI, 5 LLE.  Musculoskeletal: lacks some left scapular rotation. Can easily range to 80-90 degrees of abduction. Left side abducts to 110 easily. Minimal pain with movement. Cervical rom is expectedly limited.  Psych: Pt's affect is appropriate. Pt is cooperative   Assessment & Plan:   1. 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  2.Pain syndrome due to the above  3. Neurogenic bladder.  4. Neurogenic bowel.   Plan:  1. Continue with outpt PT/OT to completion and then to HEP  2. Rapiflo or flomax per urology recs.  3. Continue oxycodone and tramadol for breakthrough pain with continued wean. Recommended decreasing to off (as he did with robaxin to help bladder). A CSA was signed.  5. Recommended follow up with NS as planned.  6. Follow up in 3 months. 30 minutes of face to face patient care time were spent during this visit. All questions were encouraged and answered.

## 2012-06-05 ENCOUNTER — Ambulatory Visit: Payer: Medicare HMO | Admitting: Physical Therapy

## 2012-06-05 ENCOUNTER — Ambulatory Visit: Payer: Medicare HMO | Admitting: Occupational Therapy

## 2012-06-09 ENCOUNTER — Ambulatory Visit (HOSPITAL_COMMUNITY)
Admission: RE | Admit: 2012-06-09 | Discharge: 2012-06-09 | Disposition: A | Discharge status: Home / Self Care | Payer: Managed Care, Other (non HMO) | Source: Ambulatory Visit | Attending: Urology | Admitting: Urology

## 2012-06-09 DIAGNOSIS — N281 Cyst of kidney, acquired: Secondary | ICD-10-CM | POA: Insufficient documentation

## 2012-06-09 DIAGNOSIS — D1803 Hemangioma of intra-abdominal structures: Secondary | ICD-10-CM | POA: Insufficient documentation

## 2012-06-09 MED ORDER — GADOBENATE DIMEGLUMINE 529 MG/ML IV SOLN
15.0000 mL | Freq: Once | INTRAVENOUS | Status: AC | PRN
  Administered 2012-06-09: 15 mL via INTRAVENOUS

## 2012-06-10 ENCOUNTER — Ambulatory Visit: Payer: Medicare HMO | Admitting: Physical Therapy

## 2012-06-10 ENCOUNTER — Ambulatory Visit: Payer: Medicare HMO | Admitting: Occupational Therapy

## 2012-06-12 ENCOUNTER — Ambulatory Visit: Payer: Medicare HMO | Admitting: Occupational Therapy

## 2012-06-12 ENCOUNTER — Ambulatory Visit: Payer: Medicare HMO | Admitting: Physical Therapy

## 2012-06-17 ENCOUNTER — Ambulatory Visit: Payer: Medicare HMO | Admitting: Physical Therapy

## 2012-06-17 ENCOUNTER — Ambulatory Visit: Payer: Medicare HMO | Admitting: Occupational Therapy

## 2012-06-18 ENCOUNTER — Telehealth: Payer: Self-pay

## 2012-06-18 NOTE — Telephone Encounter (Signed)
Left message for patient to call office regarding urine drug screen.  Discharge letter mailed.

## 2012-06-18 NOTE — Telephone Encounter (Signed)
Message copied by Judd Gaudier on Wed Jun 18, 2012 10:30 AM ------      Message from: Su Monks      Created: Tue Jun 17, 2012 12:22 PM       Patient signed a Community education officer, he violated his contract by taking illegal drugs and alcohol with the medication we prescribe, he should be d/c ------

## 2012-06-19 ENCOUNTER — Ambulatory Visit: Payer: Medicare HMO | Admitting: Physical Therapy

## 2012-06-19 ENCOUNTER — Ambulatory Visit: Payer: Medicare HMO | Admitting: Occupational Therapy

## 2012-06-24 ENCOUNTER — Ambulatory Visit: Payer: Medicare HMO | Admitting: Physical Therapy

## 2012-06-24 ENCOUNTER — Ambulatory Visit: Payer: Medicare HMO | Attending: Physical Medicine & Rehabilitation | Admitting: Occupational Therapy

## 2012-06-24 DIAGNOSIS — R269 Unspecified abnormalities of gait and mobility: Secondary | ICD-10-CM | POA: Insufficient documentation

## 2012-06-24 DIAGNOSIS — Z5189 Encounter for other specified aftercare: Secondary | ICD-10-CM | POA: Insufficient documentation

## 2012-06-24 DIAGNOSIS — M6281 Muscle weakness (generalized): Secondary | ICD-10-CM | POA: Insufficient documentation

## 2012-06-24 DIAGNOSIS — M2560 Stiffness of unspecified joint, not elsewhere classified: Secondary | ICD-10-CM | POA: Insufficient documentation

## 2012-06-26 ENCOUNTER — Other Ambulatory Visit: Payer: Self-pay | Admitting: Family Medicine

## 2012-06-26 ENCOUNTER — Ambulatory Visit: Payer: Medicare HMO | Admitting: Occupational Therapy

## 2012-06-26 ENCOUNTER — Ambulatory Visit: Payer: Medicare HMO | Admitting: Physical Therapy

## 2012-06-26 DIAGNOSIS — I82402 Acute embolism and thrombosis of unspecified deep veins of left lower extremity: Secondary | ICD-10-CM

## 2012-07-01 ENCOUNTER — Ambulatory Visit: Payer: Medicare HMO | Admitting: Occupational Therapy

## 2012-07-02 ENCOUNTER — Ambulatory Visit: Payer: Medicare HMO | Admitting: Occupational Therapy

## 2012-07-08 ENCOUNTER — Ambulatory Visit: Payer: Medicare HMO | Admitting: Occupational Therapy

## 2012-07-08 ENCOUNTER — Ambulatory Visit
Admission: RE | Admit: 2012-07-08 | Discharge: 2012-07-08 | Disposition: A | Discharge status: Home / Self Care | Payer: Managed Care, Other (non HMO) | Source: Ambulatory Visit | Attending: Family Medicine | Admitting: Family Medicine

## 2012-07-08 ENCOUNTER — Inpatient Hospital Stay: Admission: RE | Admit: 2012-07-08 | Payer: Medicare HMO | Source: Ambulatory Visit

## 2012-07-08 DIAGNOSIS — I82402 Acute embolism and thrombosis of unspecified deep veins of left lower extremity: Secondary | ICD-10-CM

## 2012-07-10 ENCOUNTER — Ambulatory Visit: Payer: Medicare HMO | Admitting: Occupational Therapy

## 2012-07-15 ENCOUNTER — Encounter: Payer: Medicare HMO | Admitting: Occupational Therapy

## 2012-07-17 ENCOUNTER — Encounter: Payer: Medicare HMO | Admitting: Occupational Therapy

## 2012-07-18 ENCOUNTER — Ambulatory Visit: Payer: Medicare HMO | Admitting: Occupational Therapy

## 2012-08-12 ENCOUNTER — Telehealth: Payer: Self-pay | Admitting: *Deleted

## 2012-08-12 NOTE — Telephone Encounter (Signed)
Patient said he may have had another exposure to MRSA, said his wife had a boil on her buttocks that he was in contact with and he thinks it was MRSA. Not sure if he needs another appt as he was cleared. [place], please advise. Frank Brock

## 2012-08-15 NOTE — Telephone Encounter (Signed)
Not sure if we had talked about this guy already. Just continue with the usual hand washing with soap and water if he comes in contact with "fluid" like pus. Call PCP or [place] if he develops recurrent boils. No need to place him on antibiotics for now.

## 2012-08-15 NOTE — Telephone Encounter (Signed)
Patient notified Frank Brock  

## 2012-09-03 ENCOUNTER — Ambulatory Visit: Payer: Medicare HMO | Admitting: Physical Medicine & Rehabilitation

## 2013-08-18 ENCOUNTER — Telehealth: Payer: Self-pay | Admitting: *Deleted

## 2013-08-18 NOTE — Telephone Encounter (Signed)
Patient's mother, Opal Sidles called for advise from Dr. Baxter Flattery. He is in [facility] in [place] and is being transferred to [place], [facility] hospital for MRSA in his spine. She said Dr. Baxter Flattery is very familiar with him ( last office visit 04/2012) . She is requesting that Dr. Baxter Flattery call her at [phone]. Reassured her that he would more than likely be seen by ID in [place] and the MD there could have patient sign a release for this clinic to forward records. Note sent to Dr. Debbe Bales

## 2013-11-02 IMAGING — US US EXTREM  UP VENOUS*L*
1 series · 13 of 24 positions shown · non-contrast
Comparison: Previous left upper extremity venous duplex ultrasound
performed at [HOSPITAL] on 02/13/2012.

CLINICAL DATA: History of left axillary and subclavian vein DVT.

LEFT UPPER EXTREMITY VENOUS DUPLEX ULTRASOUND
TECHNIQUE: Gray-scale sonography with graded compression, as well
as color Doppler and duplex ultrasound were performed to evaluate
the upper extremity deep venous system from the level of the
subclavian vein and including the jugular, axillary, basilic and
upper cephalic vein.  Spectral Doppler was utilized to evaluate
flow at rest and with distal augmentation maneuvers.

[Series 1: us extrem up venous*left* · 13 of 60 slices shown]
[im 1/60]
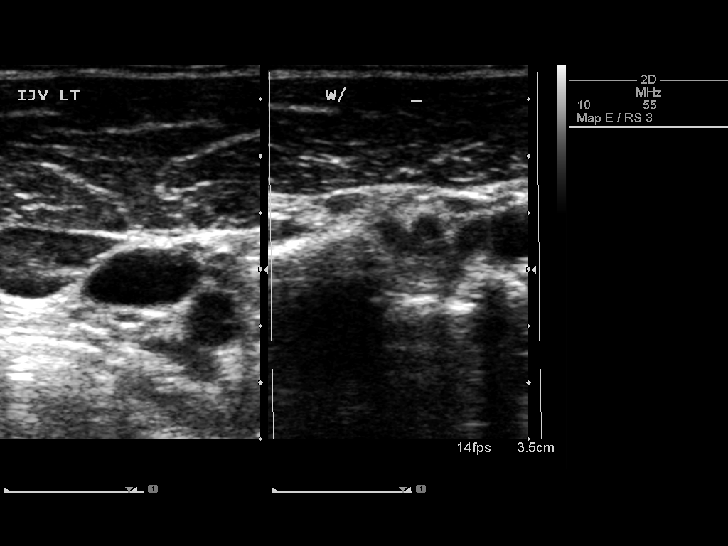
[im 6/60]
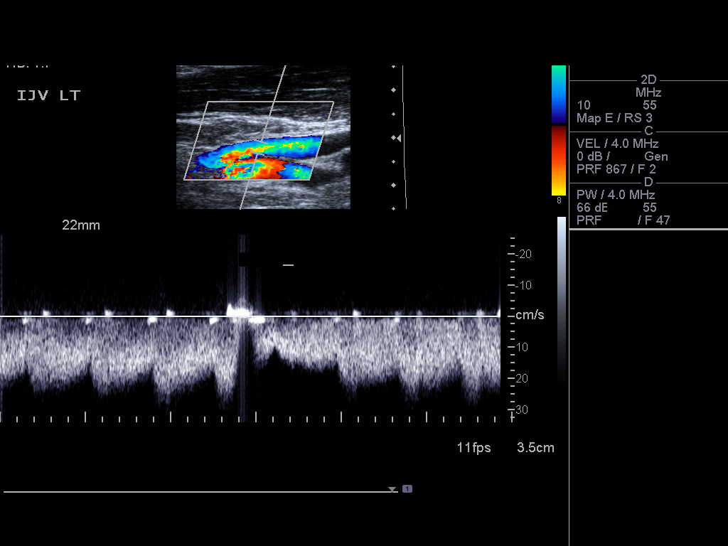
[im 11/60]
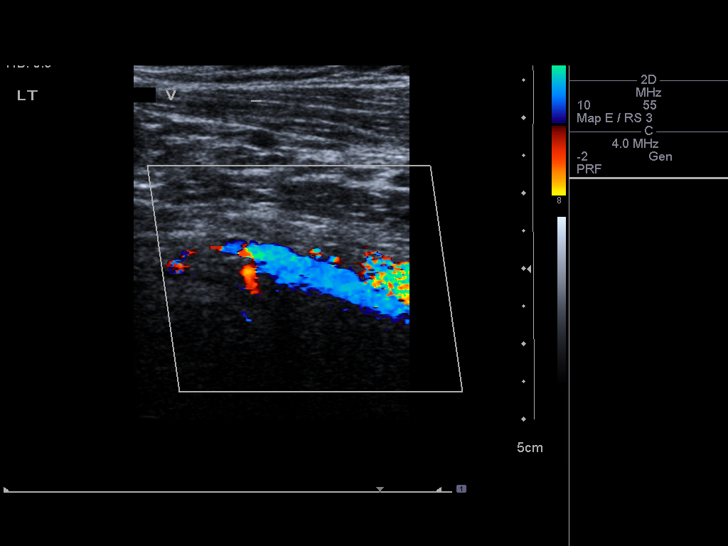
[im 16/60]
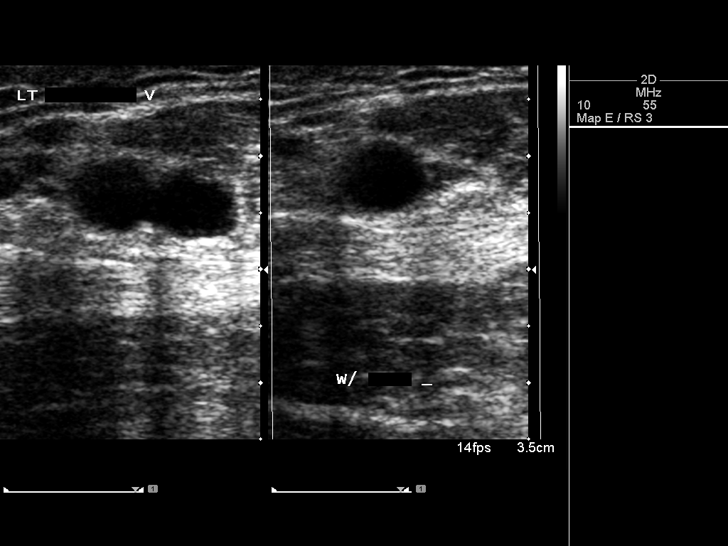
[im 21/60]
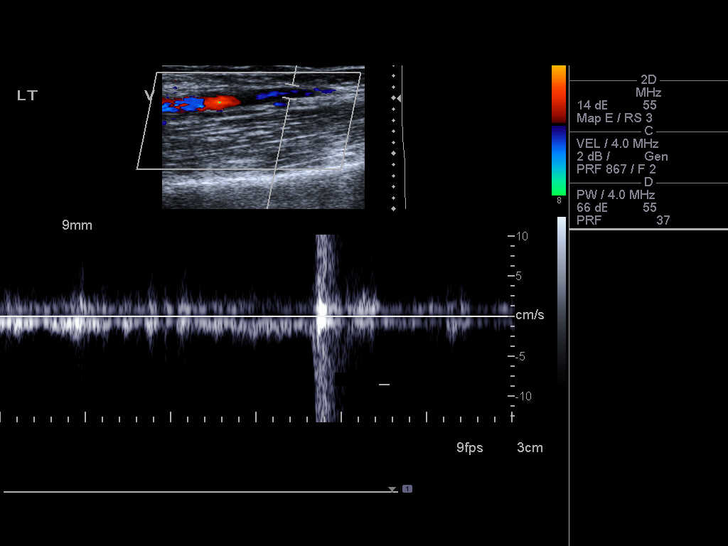
[im 26/60]
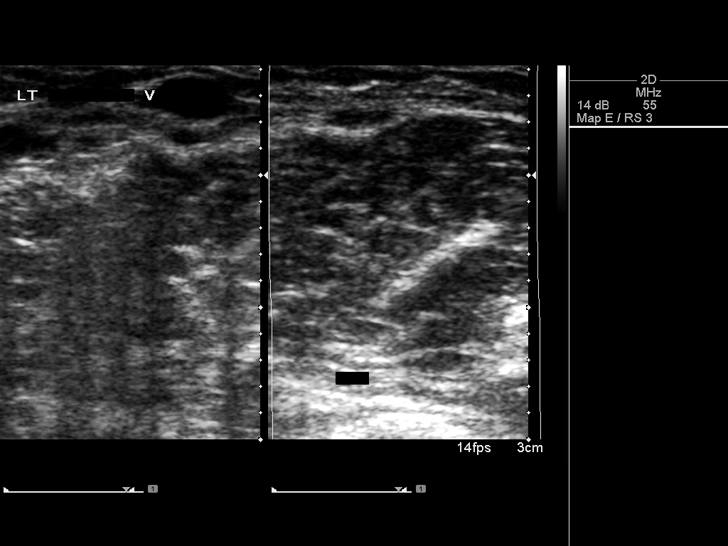
[im 31/60]
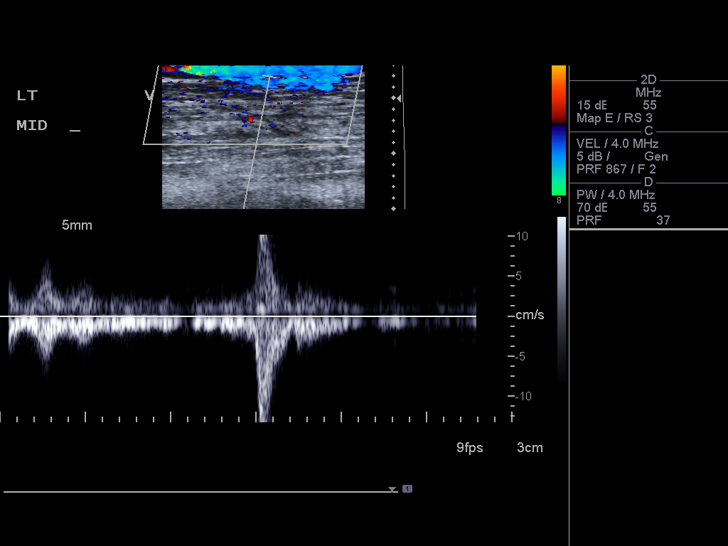
[im 34/60]
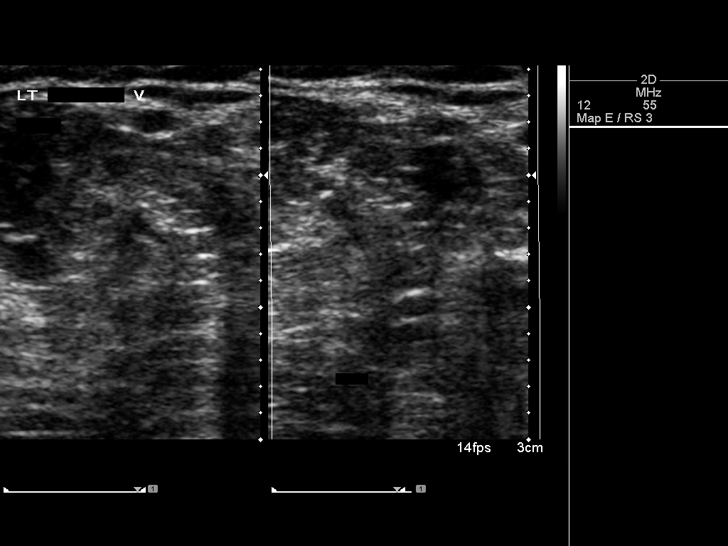
[im 39/60]
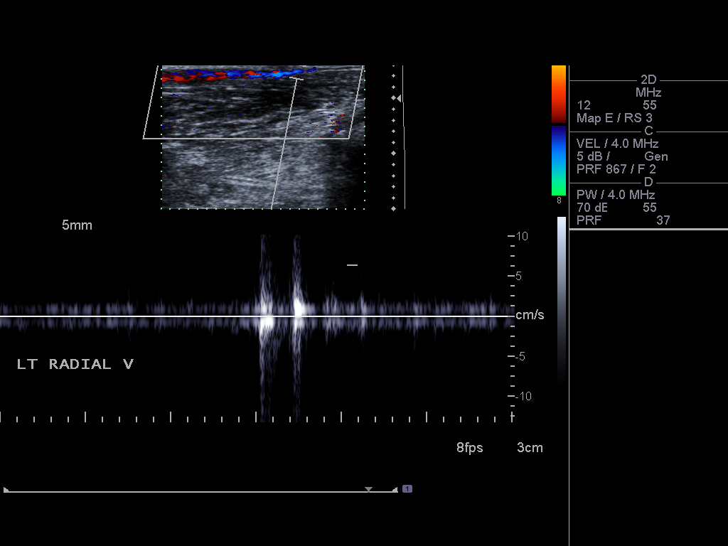
[im 44/60]
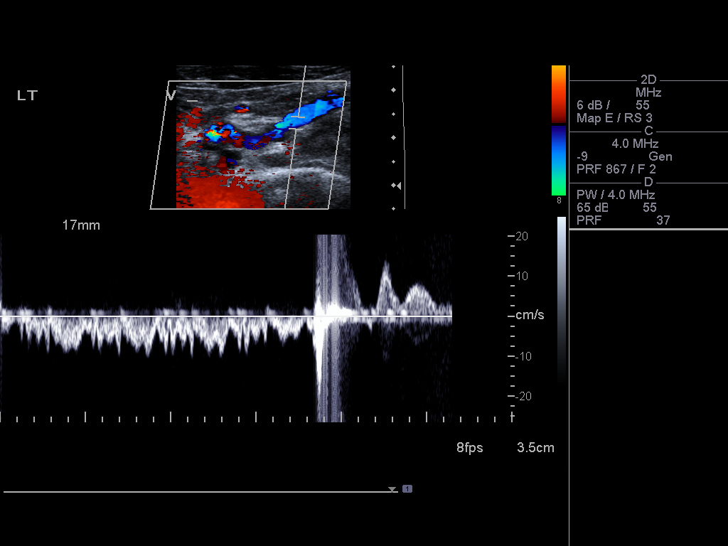
[im 49/60]
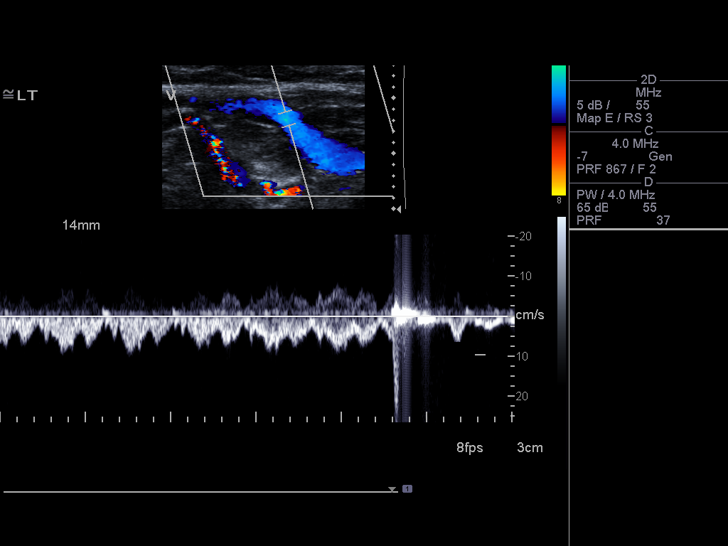
[im 54/60]
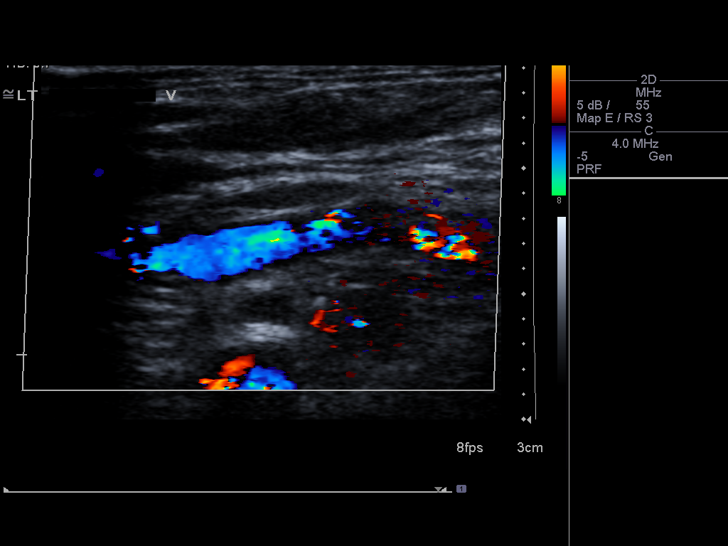
[im 60/60]
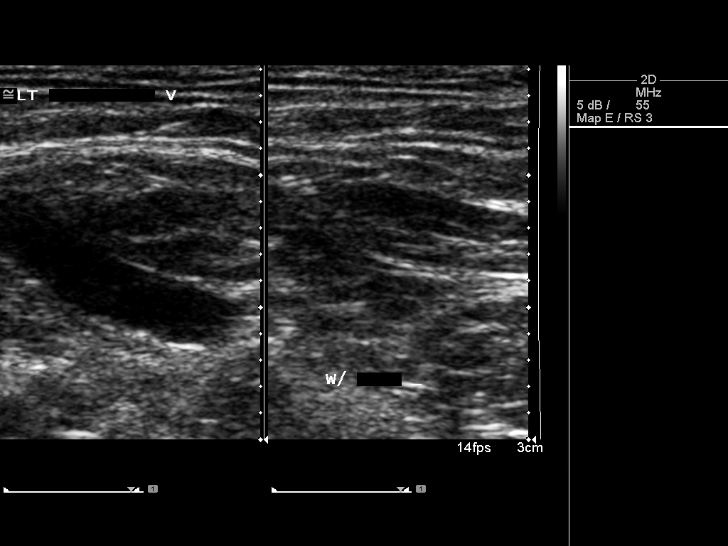

[13 of 24 positions shown; findings below may reference images not displayed]

FINDINGS: The examination today shows widely patent upper extremity
veins.  The entire axillary vein and the visualized left subclavian
vein show normal patency without evidence of thrombus.

There do appear to be some additional veins in the region of the
shoulder and supraclavicular region that may represent collateral
veins.  This could indicate potential chronic stenosis of the one
of the central veins.  Correlation suggested with any persistent or
edema of the extremity.
IMPRESSION: No further evidence of left upper extremity DVT by duplex
ultrasound.  There are some additional venous structures that may
represent collateral veins and therefore, chronic central venous
stenosis may be present.
